# Patient Record
Sex: Female | Born: 1935
Health system: Southern US, Community
[De-identification: ages and names within clinical notes are randomized; demographics above are authoritative.]

## PROBLEM LIST (undated history)

## (undated) DIAGNOSIS — H353290 Exudative age-related macular degeneration, unspecified eye, stage unspecified: Secondary | ICD-10-CM

## (undated) DIAGNOSIS — E669 Obesity, unspecified: Secondary | ICD-10-CM

## (undated) DIAGNOSIS — I2510 Atherosclerotic heart disease of native coronary artery without angina pectoris: Secondary | ICD-10-CM

## (undated) DIAGNOSIS — I447 Left bundle-branch block, unspecified: Secondary | ICD-10-CM

## (undated) DIAGNOSIS — I6529 Occlusion and stenosis of unspecified carotid artery: Secondary | ICD-10-CM

## (undated) DIAGNOSIS — E785 Hyperlipidemia, unspecified: Secondary | ICD-10-CM

## (undated) DIAGNOSIS — I4891 Unspecified atrial fibrillation: Secondary | ICD-10-CM

## (undated) DIAGNOSIS — R42 Dizziness and giddiness: Secondary | ICD-10-CM

## (undated) DIAGNOSIS — I10 Essential (primary) hypertension: Secondary | ICD-10-CM

## (undated) HISTORY — DX: Essential (primary) hypertension: I10

## (undated) HISTORY — DX: Left bundle-branch block, unspecified: I44.7

## (undated) HISTORY — DX: Atherosclerotic heart disease of native coronary artery without angina pectoris: I25.10

## (undated) HISTORY — DX: Hyperlipidemia, unspecified: E78.5

## (undated) HISTORY — DX: Dizziness and giddiness: R42

## (undated) HISTORY — DX: Obesity, unspecified: E66.9

## (undated) HISTORY — DX: Occlusion and stenosis of unspecified carotid artery: I65.29

## (undated) HISTORY — DX: Unspecified atrial fibrillation: I48.91

## (undated) HISTORY — PX: EYE SURGERY: SHX253

## (undated) HISTORY — DX: Exudative age-related macular degeneration, unspecified eye, stage unspecified: H35.3290

## (undated) HISTORY — PX: CORONARY STENT PLACEMENT: SHX1402

---

## 1998-08-20 ENCOUNTER — Ambulatory Visit (HOSPITAL_COMMUNITY): Admission: RE | Admit: 1998-08-20 | Discharge: 1998-08-20 | Payer: Self-pay | Admitting: Cardiovascular Disease

## 1998-09-03 ENCOUNTER — Other Ambulatory Visit: Admission: RE | Admit: 1998-09-03 | Discharge: 1998-09-03 | Payer: Self-pay | Admitting: Internal Medicine

## 1998-09-10 ENCOUNTER — Ambulatory Visit (HOSPITAL_COMMUNITY): Admission: RE | Admit: 1998-09-10 | Discharge: 1998-09-10 | Payer: Self-pay | Admitting: Internal Medicine

## 1999-12-29 ENCOUNTER — Inpatient Hospital Stay (HOSPITAL_COMMUNITY): Admission: EM | Admit: 1999-12-29 | Discharge: 1999-12-31 | Payer: Self-pay | Admitting: Emergency Medicine

## 1999-12-29 ENCOUNTER — Encounter: Payer: Self-pay | Admitting: Internal Medicine

## 2001-03-15 ENCOUNTER — Encounter: Payer: Self-pay | Admitting: Cardiovascular Disease

## 2001-03-15 ENCOUNTER — Ambulatory Visit (HOSPITAL_COMMUNITY): Admission: RE | Admit: 2001-03-15 | Discharge: 2001-03-15 | Payer: Self-pay | Admitting: Cardiovascular Disease

## 2002-08-21 ENCOUNTER — Other Ambulatory Visit: Admission: RE | Admit: 2002-08-21 | Discharge: 2002-08-21 | Payer: Self-pay | Admitting: Internal Medicine

## 2003-09-04 ENCOUNTER — Emergency Department (HOSPITAL_COMMUNITY): Admission: EM | Admit: 2003-09-04 | Discharge: 2003-09-04 | Payer: Self-pay | Admitting: Emergency Medicine

## 2003-12-17 ENCOUNTER — Ambulatory Visit (HOSPITAL_COMMUNITY): Admission: RE | Admit: 2003-12-17 | Discharge: 2003-12-17 | Payer: Self-pay | Admitting: Internal Medicine

## 2004-01-16 ENCOUNTER — Inpatient Hospital Stay (HOSPITAL_COMMUNITY): Admission: EM | Admit: 2004-01-16 | Discharge: 2004-01-21 | Payer: Self-pay | Admitting: Emergency Medicine

## 2004-01-18 ENCOUNTER — Encounter: Payer: Self-pay | Admitting: Cardiology

## 2004-04-18 ENCOUNTER — Ambulatory Visit: Payer: Self-pay | Admitting: Cardiology

## 2004-05-09 ENCOUNTER — Ambulatory Visit: Payer: Self-pay | Admitting: Internal Medicine

## 2004-05-30 ENCOUNTER — Ambulatory Visit: Payer: Self-pay | Admitting: Cardiology

## 2004-06-23 ENCOUNTER — Ambulatory Visit: Payer: Self-pay | Admitting: Internal Medicine

## 2004-07-04 ENCOUNTER — Ambulatory Visit: Payer: Self-pay | Admitting: Internal Medicine

## 2004-07-25 ENCOUNTER — Ambulatory Visit: Payer: Self-pay | Admitting: Cardiology

## 2004-08-22 ENCOUNTER — Ambulatory Visit: Payer: Self-pay | Admitting: Cardiology

## 2004-09-05 ENCOUNTER — Ambulatory Visit: Payer: Self-pay | Admitting: Cardiology

## 2004-09-19 ENCOUNTER — Ambulatory Visit: Payer: Self-pay | Admitting: Cardiology

## 2004-09-28 ENCOUNTER — Ambulatory Visit: Payer: Self-pay | Admitting: Cardiovascular Disease

## 2004-10-10 ENCOUNTER — Ambulatory Visit: Payer: Self-pay | Admitting: Cardiology

## 2004-10-24 ENCOUNTER — Ambulatory Visit: Payer: Self-pay | Admitting: Cardiology

## 2004-11-03 ENCOUNTER — Ambulatory Visit: Payer: Self-pay | Admitting: Cardiology

## 2004-11-17 ENCOUNTER — Ambulatory Visit: Payer: Self-pay | Admitting: Cardiology

## 2004-12-15 ENCOUNTER — Ambulatory Visit: Payer: Self-pay | Admitting: Cardiology

## 2004-12-29 ENCOUNTER — Ambulatory Visit: Payer: Self-pay | Admitting: Cardiovascular Disease

## 2005-01-12 ENCOUNTER — Ambulatory Visit: Payer: Self-pay | Admitting: Cardiology

## 2005-02-09 ENCOUNTER — Ambulatory Visit: Payer: Self-pay | Admitting: Internal Medicine

## 2005-02-20 ENCOUNTER — Ambulatory Visit: Payer: Self-pay

## 2005-02-20 ENCOUNTER — Ambulatory Visit: Payer: Self-pay | Admitting: Cardiovascular Disease

## 2005-03-14 ENCOUNTER — Ambulatory Visit: Payer: Self-pay | Admitting: Internal Medicine

## 2005-04-12 ENCOUNTER — Ambulatory Visit: Payer: Self-pay | Admitting: *Deleted

## 2005-04-18 ENCOUNTER — Ambulatory Visit: Payer: Self-pay | Admitting: Cardiology

## 2005-04-28 ENCOUNTER — Ambulatory Visit: Payer: Self-pay | Admitting: Cardiovascular Disease

## 2005-05-10 ENCOUNTER — Ambulatory Visit: Payer: Self-pay | Admitting: Cardiology

## 2005-05-15 ENCOUNTER — Inpatient Hospital Stay (HOSPITAL_BASED_OUTPATIENT_CLINIC_OR_DEPARTMENT_OTHER): Admission: RE | Admit: 2005-05-15 | Discharge: 2005-05-15 | Payer: Self-pay | Admitting: Cardiovascular Disease

## 2005-05-15 ENCOUNTER — Ambulatory Visit: Payer: Self-pay | Admitting: Cardiovascular Disease

## 2005-05-17 ENCOUNTER — Inpatient Hospital Stay (HOSPITAL_COMMUNITY): Admission: RE | Admit: 2005-05-17 | Discharge: 2005-05-18 | Payer: Self-pay | Admitting: Cardiology

## 2005-05-22 ENCOUNTER — Ambulatory Visit: Payer: Self-pay | Admitting: Cardiology

## 2005-05-30 ENCOUNTER — Ambulatory Visit: Payer: Self-pay | Admitting: Cardiovascular Disease

## 2005-05-31 ENCOUNTER — Ambulatory Visit: Payer: Self-pay | Admitting: Internal Medicine

## 2005-06-08 ENCOUNTER — Ambulatory Visit: Payer: Self-pay | Admitting: *Deleted

## 2005-06-15 ENCOUNTER — Encounter (HOSPITAL_COMMUNITY): Admission: RE | Admit: 2005-06-15 | Discharge: 2005-09-13 | Payer: Self-pay | Admitting: Cardiovascular Disease

## 2005-06-20 ENCOUNTER — Ambulatory Visit: Payer: Self-pay | Admitting: Cardiology

## 2005-07-10 ENCOUNTER — Ambulatory Visit: Payer: Self-pay | Admitting: Cardiovascular Disease

## 2005-07-18 ENCOUNTER — Ambulatory Visit: Payer: Self-pay | Admitting: Cardiology

## 2005-08-15 ENCOUNTER — Ambulatory Visit: Payer: Self-pay | Admitting: *Deleted

## 2005-09-12 ENCOUNTER — Ambulatory Visit: Payer: Self-pay | Admitting: Cardiology

## 2005-10-03 ENCOUNTER — Ambulatory Visit: Payer: Self-pay | Admitting: Cardiovascular Disease

## 2005-10-31 ENCOUNTER — Ambulatory Visit: Payer: Self-pay | Admitting: *Deleted

## 2005-11-13 ENCOUNTER — Ambulatory Visit: Payer: Self-pay

## 2005-11-23 ENCOUNTER — Ambulatory Visit: Payer: Self-pay | Admitting: Cardiovascular Disease

## 2005-11-28 ENCOUNTER — Ambulatory Visit: Payer: Self-pay | Admitting: Cardiology

## 2005-12-27 ENCOUNTER — Ambulatory Visit: Payer: Self-pay | Admitting: *Deleted

## 2006-01-24 ENCOUNTER — Ambulatory Visit: Payer: Self-pay | Admitting: Cardiology

## 2006-02-14 ENCOUNTER — Ambulatory Visit: Payer: Self-pay | Admitting: Cardiovascular Disease

## 2006-02-16 ENCOUNTER — Ambulatory Visit: Payer: Self-pay | Admitting: Cardiology

## 2006-03-14 ENCOUNTER — Ambulatory Visit: Payer: Self-pay | Admitting: Internal Medicine

## 2006-04-11 ENCOUNTER — Ambulatory Visit: Payer: Self-pay | Admitting: Cardiology

## 2006-05-02 ENCOUNTER — Ambulatory Visit: Payer: Self-pay | Admitting: Cardiology

## 2006-05-30 ENCOUNTER — Ambulatory Visit: Payer: Self-pay | Admitting: *Deleted

## 2006-06-14 ENCOUNTER — Ambulatory Visit: Payer: Self-pay | Admitting: Cardiology

## 2006-07-02 ENCOUNTER — Ambulatory Visit: Payer: Self-pay | Admitting: Cardiology

## 2006-07-16 ENCOUNTER — Ambulatory Visit: Payer: Self-pay | Admitting: Cardiology

## 2006-07-30 ENCOUNTER — Ambulatory Visit: Payer: Self-pay | Admitting: Cardiology

## 2006-08-27 ENCOUNTER — Ambulatory Visit: Payer: Self-pay | Admitting: Internal Medicine

## 2006-08-30 ENCOUNTER — Ambulatory Visit: Payer: Self-pay | Admitting: Cardiovascular Disease

## 2006-09-24 ENCOUNTER — Ambulatory Visit: Payer: Self-pay | Admitting: Cardiology

## 2006-10-15 ENCOUNTER — Ambulatory Visit: Payer: Self-pay | Admitting: Cardiovascular Disease

## 2006-11-07 ENCOUNTER — Ambulatory Visit: Payer: Self-pay | Admitting: Cardiology

## 2006-11-28 ENCOUNTER — Ambulatory Visit: Payer: Self-pay | Admitting: Cardiology

## 2006-12-26 ENCOUNTER — Ambulatory Visit: Payer: Self-pay | Admitting: Internal Medicine

## 2007-01-15 ENCOUNTER — Ambulatory Visit: Payer: Self-pay | Admitting: Cardiology

## 2007-02-12 ENCOUNTER — Ambulatory Visit: Payer: Self-pay | Admitting: Cardiology

## 2007-03-12 ENCOUNTER — Ambulatory Visit: Payer: Self-pay | Admitting: Cardiology

## 2007-04-09 ENCOUNTER — Ambulatory Visit: Payer: Self-pay | Admitting: Cardiology

## 2007-05-06 ENCOUNTER — Ambulatory Visit: Payer: Self-pay | Admitting: Cardiology

## 2007-06-10 ENCOUNTER — Ambulatory Visit: Payer: Self-pay | Admitting: Cardiology

## 2007-07-08 ENCOUNTER — Ambulatory Visit: Payer: Self-pay | Admitting: Cardiology

## 2007-08-05 ENCOUNTER — Ambulatory Visit: Payer: Self-pay | Admitting: Cardiology

## 2007-09-02 ENCOUNTER — Ambulatory Visit: Payer: Self-pay | Admitting: Cardiology

## 2007-09-30 ENCOUNTER — Ambulatory Visit: Payer: Self-pay | Admitting: Cardiology

## 2007-10-28 ENCOUNTER — Ambulatory Visit: Payer: Self-pay | Admitting: Cardiology

## 2007-12-04 ENCOUNTER — Ambulatory Visit: Payer: Self-pay | Admitting: Internal Medicine

## 2008-01-01 ENCOUNTER — Ambulatory Visit: Payer: Self-pay | Admitting: Cardiology

## 2008-01-29 ENCOUNTER — Ambulatory Visit: Payer: Self-pay | Admitting: Cardiology

## 2008-02-19 ENCOUNTER — Ambulatory Visit: Payer: Self-pay | Admitting: Internal Medicine

## 2008-03-18 ENCOUNTER — Ambulatory Visit: Payer: Self-pay | Admitting: Internal Medicine

## 2008-04-15 ENCOUNTER — Ambulatory Visit: Payer: Self-pay | Admitting: Cardiovascular Disease

## 2008-05-13 ENCOUNTER — Ambulatory Visit: Payer: Self-pay | Admitting: Internal Medicine

## 2008-06-10 ENCOUNTER — Ambulatory Visit: Payer: Self-pay | Admitting: Internal Medicine

## 2008-07-08 ENCOUNTER — Ambulatory Visit: Payer: Self-pay | Admitting: Cardiology

## 2008-08-05 ENCOUNTER — Ambulatory Visit: Payer: Self-pay | Admitting: Cardiology

## 2008-08-19 ENCOUNTER — Ambulatory Visit: Payer: Self-pay | Admitting: Cardiovascular Disease

## 2008-09-02 ENCOUNTER — Ambulatory Visit: Payer: Self-pay | Admitting: Cardiovascular Disease

## 2008-09-02 ENCOUNTER — Ambulatory Visit: Payer: Self-pay | Admitting: Cardiology

## 2008-09-02 LAB — CONVERTED CEMR LAB
ALT: 19 units/L (ref 0–35)
AST: 24 units/L (ref 0–37)
Albumin: 3.9 g/dL (ref 3.5–5.2)
Alkaline Phosphatase: 65 units/L (ref 39–117)
BUN: 16 mg/dL (ref 6–23)
Bilirubin, Direct: 0.1 mg/dL (ref 0.0–0.3)
CO2: 29 meq/L (ref 19–32)
Calcium: 9.3 mg/dL (ref 8.4–10.5)
Chloride: 110 meq/L (ref 96–112)
Cholesterol: 159 mg/dL (ref 0–200)
Creatinine, Ser: 1 mg/dL (ref 0.4–1.2)
Direct LDL: 89.5 mg/dL
GFR calc non Af Amer: 57.28 mL/min (ref >60)
Glucose, Bld: 120 mg/dL — ABNORMAL HIGH (ref 70–99)
HDL: 45.4 mg/dL (ref >39.00)
Potassium: 4.3 meq/L (ref 3.5–5.1)
Sodium: 144 meq/L (ref 135–145)
Total Bilirubin: 0.9 mg/dL (ref 0.3–1.2)
Total CHOL/HDL Ratio: 4
Total Protein: 7.1 g/dL (ref 6.0–8.3)
Triglycerides: 220 mg/dL — ABNORMAL HIGH (ref 0.0–149.0)
VLDL: 44 mg/dL — ABNORMAL HIGH (ref 0.0–40.0)

## 2008-09-30 ENCOUNTER — Ambulatory Visit: Payer: Self-pay | Admitting: Internal Medicine

## 2008-10-21 ENCOUNTER — Ambulatory Visit: Payer: Self-pay | Admitting: Cardiology

## 2008-10-30 ENCOUNTER — Ambulatory Visit: Payer: Self-pay | Admitting: Cardiology

## 2008-11-10 ENCOUNTER — Encounter: Payer: Self-pay | Admitting: *Deleted

## 2008-11-18 ENCOUNTER — Ambulatory Visit: Payer: Self-pay | Admitting: Cardiology

## 2008-11-18 LAB — CONVERTED CEMR LAB: Protime: 17.9

## 2008-12-15 ENCOUNTER — Encounter: Payer: Self-pay | Admitting: Cardiovascular Disease

## 2008-12-16 ENCOUNTER — Encounter (INDEPENDENT_AMBULATORY_CARE_PROVIDER_SITE_OTHER): Payer: Self-pay | Admitting: Cardiology

## 2008-12-16 ENCOUNTER — Ambulatory Visit: Payer: Self-pay | Admitting: Internal Medicine

## 2008-12-16 ENCOUNTER — Encounter: Payer: Self-pay | Admitting: *Deleted

## 2008-12-16 DIAGNOSIS — I10 Essential (primary) hypertension: Secondary | ICD-10-CM | POA: Insufficient documentation

## 2008-12-16 DIAGNOSIS — I2510 Atherosclerotic heart disease of native coronary artery without angina pectoris: Secondary | ICD-10-CM

## 2008-12-16 DIAGNOSIS — I152 Hypertension secondary to endocrine disorders: Secondary | ICD-10-CM | POA: Insufficient documentation

## 2008-12-16 DIAGNOSIS — E1169 Type 2 diabetes mellitus with other specified complication: Secondary | ICD-10-CM | POA: Insufficient documentation

## 2008-12-16 DIAGNOSIS — I4891 Unspecified atrial fibrillation: Secondary | ICD-10-CM | POA: Insufficient documentation

## 2008-12-16 DIAGNOSIS — E782 Mixed hyperlipidemia: Secondary | ICD-10-CM

## 2008-12-16 LAB — CONVERTED CEMR LAB: POC INR: 2.9

## 2009-01-13 ENCOUNTER — Ambulatory Visit: Payer: Self-pay | Admitting: Cardiology

## 2009-01-13 LAB — CONVERTED CEMR LAB
POC INR: 2
Prothrombin Time: 17.3 s

## 2009-02-10 ENCOUNTER — Ambulatory Visit: Payer: Self-pay | Admitting: Internal Medicine

## 2009-02-10 ENCOUNTER — Encounter (INDEPENDENT_AMBULATORY_CARE_PROVIDER_SITE_OTHER): Payer: Self-pay | Admitting: Cardiology

## 2009-03-10 ENCOUNTER — Ambulatory Visit: Payer: Self-pay | Admitting: Cardiovascular Disease

## 2009-04-07 ENCOUNTER — Ambulatory Visit: Payer: Self-pay | Admitting: Cardiology

## 2009-04-07 LAB — CONVERTED CEMR LAB: POC INR: 2.1

## 2009-05-04 ENCOUNTER — Ambulatory Visit: Payer: Self-pay | Admitting: Cardiovascular Disease

## 2009-05-28 ENCOUNTER — Ambulatory Visit: Payer: Self-pay | Admitting: Cardiovascular Disease

## 2009-06-25 ENCOUNTER — Ambulatory Visit: Payer: Self-pay | Admitting: Internal Medicine

## 2009-07-07 ENCOUNTER — Encounter (INDEPENDENT_AMBULATORY_CARE_PROVIDER_SITE_OTHER): Payer: Self-pay | Admitting: *Deleted

## 2009-07-23 ENCOUNTER — Ambulatory Visit: Payer: Self-pay | Admitting: Internal Medicine

## 2009-07-23 LAB — CONVERTED CEMR LAB: POC INR: 1.9

## 2009-08-13 ENCOUNTER — Ambulatory Visit: Payer: Self-pay | Admitting: Cardiology

## 2009-08-13 LAB — CONVERTED CEMR LAB: POC INR: 2

## 2009-08-23 DIAGNOSIS — E669 Obesity, unspecified: Secondary | ICD-10-CM | POA: Insufficient documentation

## 2009-08-31 ENCOUNTER — Ambulatory Visit: Payer: Self-pay | Admitting: Cardiovascular Disease

## 2009-08-31 DIAGNOSIS — I779 Disorder of arteries and arterioles, unspecified: Secondary | ICD-10-CM | POA: Insufficient documentation

## 2009-08-31 DIAGNOSIS — I739 Peripheral vascular disease, unspecified: Secondary | ICD-10-CM

## 2009-09-10 ENCOUNTER — Ambulatory Visit: Payer: Self-pay | Admitting: Internal Medicine

## 2009-09-10 LAB — CONVERTED CEMR LAB: POC INR: 1.9

## 2009-09-17 ENCOUNTER — Ambulatory Visit: Payer: Self-pay

## 2009-09-17 ENCOUNTER — Ambulatory Visit: Payer: Self-pay | Admitting: Cardiovascular Disease

## 2009-09-20 ENCOUNTER — Encounter (INDEPENDENT_AMBULATORY_CARE_PROVIDER_SITE_OTHER): Payer: Self-pay | Admitting: *Deleted

## 2009-10-08 ENCOUNTER — Ambulatory Visit: Payer: Self-pay | Admitting: Cardiology

## 2009-10-08 LAB — CONVERTED CEMR LAB: POC INR: 2.3

## 2009-11-05 ENCOUNTER — Ambulatory Visit: Payer: Self-pay | Admitting: Cardiology

## 2009-11-05 LAB — CONVERTED CEMR LAB: POC INR: 2.3

## 2009-12-03 ENCOUNTER — Encounter (INDEPENDENT_AMBULATORY_CARE_PROVIDER_SITE_OTHER): Payer: Self-pay | Admitting: *Deleted

## 2009-12-03 ENCOUNTER — Ambulatory Visit: Payer: Self-pay | Admitting: Cardiology

## 2009-12-31 ENCOUNTER — Ambulatory Visit: Payer: Self-pay | Admitting: Cardiology

## 2009-12-31 LAB — CONVERTED CEMR LAB: POC INR: 1.6

## 2010-01-21 ENCOUNTER — Ambulatory Visit: Payer: Self-pay | Admitting: Internal Medicine

## 2010-01-21 LAB — CONVERTED CEMR LAB: POC INR: 1.9

## 2010-02-18 ENCOUNTER — Ambulatory Visit: Payer: Self-pay | Admitting: Internal Medicine

## 2010-03-10 ENCOUNTER — Ambulatory Visit: Payer: Self-pay | Admitting: Cardiovascular Disease

## 2010-03-10 DIAGNOSIS — R0602 Shortness of breath: Secondary | ICD-10-CM | POA: Insufficient documentation

## 2010-03-18 ENCOUNTER — Ambulatory Visit (HOSPITAL_COMMUNITY): Admission: RE | Admit: 2010-03-18 | Discharge: 2010-03-18 | Payer: Self-pay | Admitting: Cardiovascular Disease

## 2010-03-18 ENCOUNTER — Ambulatory Visit: Payer: Self-pay | Admitting: Cardiology

## 2010-03-18 ENCOUNTER — Ambulatory Visit: Payer: Self-pay

## 2010-03-18 ENCOUNTER — Encounter: Payer: Self-pay | Admitting: Cardiovascular Disease

## 2010-04-15 ENCOUNTER — Ambulatory Visit: Payer: Self-pay | Admitting: Cardiology

## 2010-04-15 LAB — CONVERTED CEMR LAB: POC INR: 2.3

## 2010-05-13 ENCOUNTER — Ambulatory Visit: Payer: Self-pay | Admitting: Cardiovascular Disease

## 2010-05-13 LAB — CONVERTED CEMR LAB: POC INR: 2.6

## 2010-06-10 ENCOUNTER — Ambulatory Visit: Payer: Self-pay | Admitting: Cardiology

## 2010-07-08 ENCOUNTER — Ambulatory Visit: Admission: RE | Admit: 2010-07-08 | Discharge: 2010-07-08 | Payer: Self-pay | Source: Home / Self Care

## 2010-07-08 LAB — CONVERTED CEMR LAB: POC INR: 2.1

## 2010-07-10 LAB — CONVERTED CEMR LAB
ALT: 20 units/L (ref 0–35)
Alkaline Phosphatase: 60 units/L (ref 39–117)
BUN: 17 mg/dL (ref 6–23)
Basophils Relative: 0.8 % (ref 0.0–3.0)
Bilirubin, Direct: 0.1 mg/dL (ref 0.0–0.3)
Cholesterol: 151 mg/dL (ref 0–200)
Creatinine, Ser: 1.1 mg/dL (ref 0.4–1.2)
Eosinophils Relative: 7.3 % — ABNORMAL HIGH (ref 0.0–5.0)
GFR calc non Af Amer: 51.17 mL/min (ref >60)
Glucose, Bld: 118 mg/dL — ABNORMAL HIGH (ref 70–99)
LDL Cholesterol: 61 mg/dL (ref 0–99)
Lymphocytes Relative: 34.2 % (ref 12.0–46.0)
MCV: 90.9 fL (ref 78.0–100.0)
Monocytes Relative: 10.2 % (ref 3.0–12.0)
Neutrophils Relative %: 47.5 % (ref 43.0–77.0)
RBC: 4.43 M/uL (ref 3.87–5.11)
TSH: 1.88 microintl units/mL (ref 0.35–5.50)
Total Bilirubin: 0.4 mg/dL (ref 0.3–1.2)
Total Protein: 7.4 g/dL (ref 6.0–8.3)
WBC: 5.7 10*3/uL (ref 4.5–10.5)

## 2010-07-14 NOTE — Medication Information (Signed)
Summary: rov/kh   Anticoagulant Therapy  Managed by: Tammy Sours, PharmD Referring MD: Charlton Haws MD PCP: Dr. Luanna Cole. Lenord Fellers Supervising MD: Shirlee Latch MD, [place] Indication 1: Atrial Fibrillation (ICD-427.31) Lab Used: [facility] [place] Site: [address] INR POC 2.4 INR RANGE 2 - 2.5  Dietary changes: no    Health status changes: no    Bleeding/hemorrhagic complications: no    Recent/future hospitalizations: no    Any changes in medication regimen? no    Recent/future dental: no  Any missed doses?: no       Is patient compliant with meds? yes       Allergies: No Known Drug Allergies  Anticoagulation Management History:      The patient is taking warfarin and comes in today for a routine follow up visit.  Positive risk factors for bleeding include an age of 104 years or older.  The bleeding index is 'intermediate risk'.  Positive CHADS2 values include History of HTN and Age > 30 years old.  The start date was 12/28/1999.  Anticoagulation responsible provider: Shirlee Latch MD, [place].  INR POC: 2.4.  Cuvette Lot#: [id number].  Exp: 05/2011.    Anticoagulation Management Assessment/Plan:      The patient's current anticoagulation dose is Warfarin sodium 5 mg tabs: Use as directed by Anticoagulation Clinic.  The target INR is 2 - 2.5.  The next INR is due 07/08/2010.  Anticoagulation instructions were given to patient.  Results were reviewed/authorized by Tammy Sours, PharmD.         Prior Anticoagulation Instructions: INR 2.6  Continue same dose of 1/2 tablet every day.  Recheck INR in 4 weeks.   Current Anticoagulation Instructions: INR 2.4  Continue same dose of 1/2 tablet every day. Recheck INR in 4 weeks.

## 2010-07-14 NOTE — Assessment & Plan Note (Signed)
Summary: 6 month rov/sl  Medications Added CENTRUM SILVER  TABS (MULTIPLE VITAMINS-MINERALS) 1 tab by mouth once daily      Allergies Added: NKDA  Primary Provider:  Dr. Luanna Cole. [place]  CC:  sob pt thinks it jdue to weight .  History of Present Illness: Judy [name] returns today for followup.  She has chronic atrial fibrillationon anticoagulation.  She has hypertension which is well controlled. I believe she has had a distant history of coronary artery disease withstent to the RCA in 1996 and a repeat stent to the PDA, OM in 2006. Her EF has been in the 50% range.  Her last Myoview I believe was in2007 and was at low risk.  She is not having significant chest pain.Her INRs have been therapeutic. She has a history of 40% ICA stenosis and duplex last year were stable.  .  I encouraged her to see Dr Baird Kay for her primary care F/U. She needs routine lab work  She has been having some dyspnea and needs an [place] to make sure RV/LV function are still normal  Current Problems (verified): 1)  Carotid Artery Disease  (ICD-433.10) 2)  Cad, Native Vessel  (ICD-414.01) 3)  Atrial Fibrillation  (ICD-427.31) 4)  Essential Hypertension, Benign  (ICD-401.1) 5)  Mixed Hyperlipidemia  (ICD-272.2) 6)  Obesity  (ICD-278.00) 7)  Coumadin Therapy  (ICD-V58.61)  Current Medications (verified): 1)  Metoprolol Succinate 100 Mg Xr24h-Tab (Metoprolol Succinate) .Marland Kitchen.. 1 Tab By Mouth Two Times A Day 2)  Plavix 75 Mg Tabs (Clopidogrel Bisulfate) .... Take One Tablet By Mouth Daily 3)  Isosorbide Mononitrate Cr 30 Mg Xr24h-Tab (Isosorbide Mononitrate) .... Take One Tablet By Mouth Daily 4)  Lisinopril 10 Mg Tabs (Lisinopril) .... Take One Tablet By Mouth Daily 5)  Vitamin B-12 1000 Mcg Tabs (Cyanocobalamin) .Marland Kitchen.. 1 By Mouth Every Other Day. 6)  Warfarin Sodium 5 Mg Tabs (Warfarin Sodium) .... Use As Directed By Anticoagulation Clinic 7)  Pravastatin Sodium 80 Mg Tabs (Pravastatin Sodium) .... Take One Tablet By Mouth Daily  At Bedtime 8)  Aspirin 81 Mg Tbec (Aspirin) .... Take One Tablet By Mouth Daily 9)  Folic Acid 400 Mcg Tabs (Folic Acid) .Marland Kitchen.. 1 By Mouth Daily 10)  Furosemide 20 Mg Tabs (Furosemide) .... Take One Tablet By Mouth Daily. 11)  Diltiazem Hcl Er Beads 120 Mg Xr24h-Cap (Diltiazem Hcl Er Beads) .... Take One Capsule By Mouth Daily 12)  Potassium Chloride Cr 10 Meq Cr-Caps (Potassium Chloride) .... Take One Tablet By Mouth Daily 13)  Osteo Bi-Flex Adv Joint Shield  Tabs (Misc Natural Products) .Marland Kitchen.. 1 Tab Op Once Daily 14)  Claritin 10 Mg Tabs (Loratadine) .Marland Kitchen.. 1 Tab By Mouth Once Daily 15)  Centrum Silver  Tabs (Multiple Vitamins-Minerals) .Marland Kitchen.. 1 Tab By Mouth Once Daily  Allergies (verified): No Known Drug Allergies  Past History:  Past Medical History: Last updated: 08/23/2009 Current Problems:  CAD, NATIVE VESSEL (ICD-414.01) Stent RCA 96' Stent PDA and OM DES 2006 ATRIAL FIBRILLATION (ICD-427.31) ESSENTIAL HYPERTENSION, BENIGN (ICD-401.1) MIXED HYPERLIPIDEMIA (ICD-272.2) OBESITY (ICD-278.00) COUMADIN THERAPY (ICD-V58.61)  Past Surgical History: Last updated: 08/23/2009  PROCEDURE:  Drug-eluting stent placement to the PDA, drug-eluting stent  placement to the first obtuse marginal, StarClose closure of the right common femoral arteriotomy site. Successful drug-eluting stent placement in  both the posterior descending artery and the obtuse marginal. She should be  maintained on Plavix for 6 months. Aspirin and Coumadin will both be continued indefinitely. Coumadin will be resumed this evening. The lesion  was directly stented using a 2.5 x 16 mm Taxus deployed at 14 atmospheres. The stent was then post dilated using a 2.75 x 15 mm Quantum at 16 atmospheres. Repeat angiography demonstrated no residual stenosis, no dissection, and TIMI-3 flow to the distal vasculature. There was no significant stenosis in the jailed AV groove circumflex. Salvadore Farber, M.D. [facility] Electronically Signed  WED/MEDQ   D:  05/17/2005  T:  05/17/2005  Job:  [id number] cc:   Charlton Haws, M.D.    Family History: Last updated: 08/23/2009 Positive for coronary artery disease.  Social History: Last updated: 08/23/2009  She does not smoke, nor does she consume alcohol.  Review of Systems       Denies fever, malais, weight loss, blurry vision, decreased visual acuity, cough, sputum, , hemoptysis, pleuritic pain, palpitaitons, heartburn, abdominal pain, melena, lower extremity edema, claudication, or rash.   Vital Signs:  Patient profile:   77 year old female Height:      65 inches Weight:      205 pounds BMI:     34.24 Pulse rate:   72 / minute Resp:     12 per minute BP sitting:   136 / 80  (left arm)  Vitals Entered By: Kem Parkinson (March 10, 2010 10:41 AM)  Physical Exam  General:  Affect appropriate Healthy:  appears stated age HEENT: normal Neck supple with no adenopathy JVP normal left bruits no thyromegaly Lungs clear with no wheezing and good diaphragmatic motion Heart:  S1/S2 SEM  murmur,rub, gallop or click PMI normal Abdomen: benighn, BS positve, no tenderness, no AAA no bruit.  No HSM or HJR Distal pulses intact with no bruits No edema Neuro non-focal Skin warm and dry    Impression & Recommendations:  Problem # 1:  CAROTID ARTERY DISEASE (ICD-433.10) F/U duplex in a year no high grade disease by duplex 2010 Her updated medication list for this problem includes:    Plavix 75 Mg Tabs (Clopidogrel bisulfate) .Marland Kitchen... Take one tablet by mouth daily    Warfarin Sodium 5 Mg Tabs (Warfarin sodium) ..... Use as directed by anticoagulation clinic    Aspirin 81 Mg Tbec (Aspirin) .Marland Kitchen... Take one tablet by mouth daily  Problem # 2:  CAD, NATIVE VESSEL (ICD-414.01) Stable no angina Her updated medication list for this problem includes:    Metoprolol Succinate 100 Mg Xr24h-tab (Metoprolol succinate) .Marland Kitchen... 1 tab by mouth two times a day    Plavix 75 Mg Tabs (Clopidogrel  bisulfate) .Marland Kitchen... Take one tablet by mouth daily    Isosorbide Mononitrate Cr 30 Mg Xr24h-tab (Isosorbide mononitrate) .Marland Kitchen... Take one tablet by mouth daily    Lisinopril 10 Mg Tabs (Lisinopril) .Marland Kitchen... Take one tablet by mouth daily    Warfarin Sodium 5 Mg Tabs (Warfarin sodium) ..... Use as directed by anticoagulation clinic    Aspirin 81 Mg Tbec (Aspirin) .Marland Kitchen... Take one tablet by mouth daily    Diltiazem Hcl Er Beads 120 Mg Xr24h-cap (Diltiazem hcl er beads) .Marland Kitchen... Take one capsule by mouth daily  Problem # 3:  ATRIAL FIBRILLATION (ICD-427.31) F/U coumadin clinic next week.  good rate contorl on dual Rx Her updated medication list for this problem includes:    Metoprolol Succinate 100 Mg Xr24h-tab (Metoprolol succinate) .Marland Kitchen... 1 tab by mouth two times a day    Plavix 75 Mg Tabs (Clopidogrel bisulfate) .Marland Kitchen... Take one tablet by mouth daily    Warfarin Sodium 5 Mg Tabs (Warfarin sodium) ..... Use as directed by anticoagulation  clinic    Aspirin 81 Mg Tbec (Aspirin) .Marland Kitchen... Take one tablet by mouth daily  Orders: Echocardiogram (Echo)  Problem # 4:  MIXED HYPERLIPIDEMIA (ICD-272.2) Hig dose simva D/C and now on pravastatin.  Labs in 6 months Her updated medication list for this problem includes:    Pravastatin Sodium 80 Mg Tabs (Pravastatin sodium) .Marland Kitchen... Take one tablet by mouth daily at bedtime  CHOL: 151 (09/17/2009)   LDL: 61 (09/17/2009)   HDL: 52.00 (09/17/2009)   TG: 191.0 (09/17/2009)  Problem # 5:  DYSPNEA (ICD-786.05) Functional.  Rate control seems good.  Check echo for RV/LV function Her updated medication list for this problem includes:    Metoprolol Succinate 100 Mg Xr24h-tab (Metoprolol succinate) .Marland Kitchen... 1 tab by mouth two times a day    Lisinopril 10 Mg Tabs (Lisinopril) .Marland Kitchen... Take one tablet by mouth daily    Aspirin 81 Mg Tbec (Aspirin) .Marland Kitchen... Take one tablet by mouth daily    Furosemide 20 Mg Tabs (Furosemide) .Marland Kitchen... Take one tablet by mouth daily.    Diltiazem Hcl Er Beads 120  Mg Xr24h-cap (Diltiazem hcl er beads) .Marland Kitchen... Take one capsule by mouth daily  Patient Instructions: 1)  Your physician recommends that you schedule a follow-up appointment in: 6 MONTHS 2)  Your physician has requested that you have an echocardiogram.  Echocardiography is a painless test that uses sound waves to create images of your heart. It provides your doctor with information about the size and shape of your heart and how well your heart's chambers and valves are working.  This procedure takes approximately one hour. There are no restrictions for this procedure.

## 2010-07-14 NOTE — Medication Information (Signed)
Summary: rov/ewj  Anticoagulant Therapy  Managed by: Eda Keys, PharmD Referring MD: Charlton Haws MD PCP: Dr. Luanna Cole. Lenord Fellers Supervising MD: Myrtis Ser MD, Tinnie Gens Indication 1: Atrial Fibrillation (ICD-427.31) Lab Used: [facility] [place] Site: [address] INR POC 2.3 INR RANGE 2 - 2.5  Dietary changes: no    Health status changes: no    Bleeding/hemorrhagic complications: no    Recent/future hospitalizations: no    Any changes in medication regimen? no    Recent/future dental: no  Any missed doses?: no       Is patient compliant with meds? yes       Allergies: No Known Drug Allergies  Anticoagulation Management History:      The patient is taking warfarin and comes in today for a routine follow up visit.  Positive risk factors for bleeding include an age of 75 years or older.  The bleeding index is 'intermediate risk'.  Positive CHADS2 values include History of HTN and Age > 75 years old.  The start date was 12/28/1999.  Anticoagulation responsible provider: Myrtis Ser MD, Tinnie Gens.  INR POC: 2.3.  Cuvette Lot#: [id number].  Exp: 01/2011.    Anticoagulation Management Assessment/Plan:      The patient's current anticoagulation dose is Warfarin sodium 5 mg tabs: Use as directed by Anticoagulation Clinic.  The target INR is 2 - 2.5.  The next INR is due 12/03/2009.  Anticoagulation instructions were given to patient.  Results were reviewed/authorized by Eda Keys, PharmD.  She was notified by Eda Keys.         Prior Anticoagulation Instructions: INR 2.3  Continue on same dosage 1/2 tablet daily.  Recheck in 4 weeks.    Current Anticoagulation Instructions: INR 2.3  Continue taking 1/2 tablet every day.  Return to clinic in 4 weeks.

## 2010-07-14 NOTE — Medication Information (Signed)
Summary: rov/ewj  Anticoagulant Therapy  Managed by: Bethena Midget, RN, BSN Referring MD: Charlton Haws MD PCP: Dr. Luanna Cole. Lenord Fellers Supervising MD: Gala Romney MD, Reuel Boom Indication 1: Atrial Fibrillation (ICD-427.31) Lab Used: [facility] [place] Site: [address] INR POC 1.9 INR RANGE 2 - 2.5  Dietary changes: no    Health status changes: no    Bleeding/hemorrhagic complications: no    Recent/future hospitalizations: no    Any changes in medication regimen? no    Recent/future dental: no  Any missed doses?: no       Is patient compliant with meds? yes       Allergies: No Known Drug Allergies  Anticoagulation Management History:      The patient is taking warfarin and comes in today for a routine follow up visit.  Positive risk factors for bleeding include an age of 75 years or older.  The bleeding index is 'intermediate risk'.  Positive CHADS2 values include History of HTN and Age > 33 years old.  The start date was 12/28/1999.  Anticoagulation responsible provider: Bensimhon MD, Reuel Boom.  INR POC: 1.9.  Cuvette Lot#: [id number].  Exp: 09/2010.    Anticoagulation Management Assessment/Plan:      The patient's current anticoagulation dose is Warfarin sodium 5 mg tabs: Use as directed by Anticoagulation Clinic.  The target INR is 2 - 2.5.  The next INR is due 08/13/2009.  Anticoagulation instructions were given to patient.  Results were reviewed/authorized by Bethena Midget, RN, BSN.  She was notified by Bethena Midget, RN, BSN.         Prior Anticoagulation Instructions: INR 1.9  Take 1 tablet today then resume same dosage 1/2 tablet daily.  Recheck in 4 weeks.    Current Anticoagulation Instructions: INR 1.9 Today take 5mg  then resume 2.5mg s everyday. Recheck in 3 weeks.

## 2010-07-14 NOTE — Medication Information (Signed)
Summary: rov/eac  Anticoagulant Therapy  Managed by: Cloyde Reams, RN, BSN Referring MD: Charlton Haws MD PCP: Dr. Luanna Cole. Lenord Fellers Supervising MD: Riley Kill MD, Maisie Fus Indication 1: Atrial Fibrillation (ICD-427.31) Lab Used: [facility]  Site: [address] INR POC 2.3 INR RANGE 2 - 2.5  Dietary changes: no    Health status changes: no    Bleeding/hemorrhagic complications: no    Recent/future hospitalizations: no    Any changes in medication regimen? no    Recent/future dental: no  Any missed doses?: no       Is patient compliant with meds? yes       Allergies: No Known Drug Allergies  Anticoagulation Management History:      The patient is taking warfarin and comes in today for a routine follow up visit.  Positive risk factors for bleeding include an age of 75 years or older.  The bleeding index is 'intermediate risk'.  Positive CHADS2 values include History of HTN and Age > 53 years old.  The start date was 12/28/1999.  Anticoagulation responsible provider: Riley Kill MD, Maisie Fus.  INR POC: 2.3.  Cuvette Lot#: [id number].  Exp: 02/2011.    Anticoagulation Management Assessment/Plan:      The patient's current anticoagulation dose is Warfarin sodium 5 mg tabs: Use as directed by Anticoagulation Clinic.  The target INR is 2 - 2.5.  The next INR is due 12/31/2009.  Anticoagulation instructions were given to patient.  Results were reviewed/authorized by Cloyde Reams, RN, BSN.  She was notified by Cloyde Reams RN.         Prior Anticoagulation Instructions: INR 2.3  Continue taking 1/2 tablet every day.  Return to clinic in 4 weeks.    Current Anticoagulation Instructions: INR 2.3  Continue on same dosage 1/2 tablet daily.  Recheck in 4 weeks.

## 2010-07-14 NOTE — Medication Information (Signed)
Summary: rov/sel      Allergies Added: NKDA Anticoagulant Therapy  Managed by: Weston Brass, PharmD Referring MD: Charlton Haws MD PCP: Dr. Luanna Cole. Lenord Fellers Supervising MD: Juanda Chance MD, Bruce Indication 1: Atrial Fibrillation (ICD-427.31) Lab Used: [facility] [place] Site: [address] INR POC 2.3 INR RANGE 2 - 2.5  Dietary changes: no    Health status changes: no    Bleeding/hemorrhagic complications: no    Recent/future hospitalizations: no    Any changes in medication regimen? no    Recent/future dental: no  Any missed doses?: no       Is patient compliant with meds? yes       Allergies (verified): No Known Drug Allergies  Anticoagulation Management History:      The patient is taking warfarin and comes in today for a routine follow up visit.  Positive risk factors for bleeding include an age of 75 years or older.  The bleeding index is 'intermediate risk'.  Positive CHADS2 values include History of HTN and Age > 22 years old.  The start date was 12/28/1999.  Anticoagulation responsible provider: Juanda Chance MD, Smitty Cords.  INR POC: 2.3.  Cuvette Lot#: [id number].  Exp: 04/2011.    Anticoagulation Management Assessment/Plan:      The patient's current anticoagulation dose is Warfarin sodium 5 mg tabs: Use as directed by Anticoagulation Clinic.  The target INR is 2 - 2.5.  The next INR is due 05/13/2010.  Anticoagulation instructions were given to patient.  Results were reviewed/authorized by Weston Brass, PharmD.  She was notified by Hoy Register, PharmD Candidate.         Prior Anticoagulation Instructions: INR 2.0  continue taking 1/2 tablet everyday. Recheck 4 weeks.   Current Anticoagulation Instructions: INR 2.3 Continue previos dose of 1/2 tablet everyday Recheck INR in 4 weeks.

## 2010-07-14 NOTE — Medication Information (Signed)
Summary: rov/tm  Anticoagulant Therapy  Managed by: Cloyde Reams, RN, BSN Referring MD: Charlton Haws MD PCP: Dr. Luanna Cole. Lenord Fellers Supervising MD: Tenny Craw MD, Gunnar Fusi Indication 1: Atrial Fibrillation (ICD-427.31) Lab Used: [facility] [place] Site: [address] INR POC 2.1 INR RANGE 2 - 2.5  Dietary changes: no    Health status changes: no    Bleeding/hemorrhagic complications: no    Recent/future hospitalizations: no    Any changes in medication regimen? no    Recent/future dental: no  Any missed doses?: no       Is patient compliant with meds? yes       Allergies: No Known Drug Allergies  Anticoagulation Management History:      The patient is taking warfarin and comes in today for a routine follow up visit.  Positive risk factors for bleeding include an age of 75 years or older.  The bleeding index is 'intermediate risk'.  Positive CHADS2 values include History of HTN and Age > 70 years old.  The start date was 12/28/1999.  Anticoagulation responsible Jackalynn Art: Tenny Craw MD, Gunnar Fusi.  INR POC: 2.1.  Cuvette Lot#: [id number].  Exp: 04/2011.    Anticoagulation Management Assessment/Plan:      The patient's current anticoagulation dose is Warfarin sodium 5 mg tabs: Use as directed by Anticoagulation Clinic.  The target INR is 2 - 2.5.  The next INR is due 03/18/2010.  Anticoagulation instructions were given to patient.  Results were reviewed/authorized by Cloyde Reams, RN, BSN.  She was notified by Cloyde Reams RN.         Prior Anticoagulation Instructions: INR 1.9  Take 1 tablet today, Aug. 12, then continue with 1/2 tablet daily.  Return to clinic in 4 weeks.  Current Anticoagulation Instructions: INR 2.1  Continue on same dosage 1/2 tablet daily.  Recheck in 4 weeks.

## 2010-07-14 NOTE — Medication Information (Signed)
Summary: rov/kh  Anticoagulant Therapy  Managed by: Bethena Midget, RN, BSN Referring MD: Charlton Haws MD PCP: Dr. Luanna Cole. Lenord Fellers Supervising MD: Tenny Craw MD, Gunnar Fusi Indication 1: Atrial Fibrillation (ICD-427.31) Lab Used: [facility] [place] Site: [address] INR POC 2.1 INR RANGE 2 - 2.5  Dietary changes: no    Health status changes: no    Bleeding/hemorrhagic complications: no    Recent/future hospitalizations: no    Any changes in medication regimen? yes       Details: Took some muscle relaxers last week  Recent/future dental: no  Any missed doses?: no       Is patient compliant with meds? yes       Allergies: No Known Drug Allergies  Anticoagulation Management History:      The patient is taking warfarin and comes in today for a routine follow up visit.  Positive risk factors for bleeding include an age of 75 years or older.  The bleeding index is 'intermediate risk'.  Positive CHADS2 values include History of HTN and Age > 35 years old.  The start date was 12/28/1999.  Anticoagulation responsible provider: Tenny Craw MD, Gunnar Fusi.  INR POC: 2.1.  Cuvette Lot#: [id number].  Exp: 06/2011.    Anticoagulation Management Assessment/Plan:      The patient's current anticoagulation dose is Warfarin sodium 5 mg tabs: Use as directed by Anticoagulation Clinic.  The target INR is 2 - 2.5.  The next INR is due 08/08/2010.  Anticoagulation instructions were given to patient.  Results were reviewed/authorized by Bethena Midget, RN, BSN.  She was notified by Bethena Midget, RN, BSN.         Prior Anticoagulation Instructions: INR 2.4  Continue same dose of 1/2 tablet every day. Recheck INR in 4 weeks.   Current Anticoagulation Instructions: INR 2.1 Continue 2.5mg s everyday. Recheck in 4 weeks.

## 2010-07-14 NOTE — Miscellaneous (Signed)
  Clinical Lists Changes due to FDA regulations pt changed from simvastatin 80mg  to pravastatin 80mg  once daily. she will have labs checked in august with her coumadin check Deliah Goody, RN  December 03, 2009 10:00 AM  Medications: Changed medication from SIMVASTATIN 80 MG TABS (SIMVASTATIN) Take one tablet by mouth daily at bedtime to PRAVASTATIN SODIUM 80 MG TABS (PRAVASTATIN SODIUM) Take one tablet by mouth daily at bedtime - Signed Rx of PRAVASTATIN SODIUM 80 MG TABS (PRAVASTATIN SODIUM) Take one tablet by mouth daily at bedtime;  #30 x 12;  Signed;  Entered by: Deliah Goody, RN;  Authorized by: Colon Branch, MD, [facility];  Method used: Electronically to [facility] [address] [place], [place], [place]  [address], Ph: [phone], Fax: [phone]    Prescriptions: PRAVASTATIN SODIUM 80 MG TABS (PRAVASTATIN SODIUM) Take one tablet by mouth daily at bedtime  #30 x 12   Entered by:   Deliah Goody, RN   Authorized by:   Colon Branch, MD, [facility]   Signed by:   Deliah Goody, RN on 12/03/2009   Method used:   Electronically to        [facility]       [address]       [place], [place]  [address]       Ph: [phone]       Fax: [phone]   RxID:   [id number]

## 2010-07-14 NOTE — Medication Information (Signed)
Summary: Judy [name]  Anticoagulant Therapy  Managed by: Cloyde Reams, RN, BSN Referring MD: Charlton Haws MD PCP: Dr. Luanna Cole. Lenord Fellers Supervising MD: Gala Romney MD, Reuel Boom Indication 1: Atrial Fibrillation (ICD-427.31) Lab Used: [facility] [place] Site: [address] INR POC 1.9 INR RANGE 2 - 2.5  Dietary changes: no    Health status changes: no    Bleeding/hemorrhagic complications: no    Recent/future hospitalizations: no    Any changes in medication regimen? no    Recent/future dental: no  Any missed doses?: no       Is patient compliant with meds? yes       Allergies (verified): No Known Drug Allergies  Anticoagulation Management History:      The patient is taking warfarin and comes in today for a routine follow up visit.  Positive risk factors for bleeding include an age of 75 years or older.  The bleeding index is 'intermediate risk'.  Positive CHADS2 values include History of HTN and Age > 24 years old.  The start date was 12/28/1999.  Anticoagulation responsible provider: Jarita Raval MD, Reuel Boom.  INR POC: 1.9.  Cuvette Lot#: [id number].  Exp: 09/2010.    Anticoagulation Management Assessment/Plan:      The patient's current anticoagulation dose is Warfarin sodium 5 mg tabs: Use as directed by Anticoagulation Clinic.  The target INR is 2 - 2.5.  The next INR is due 07/23/2009.  Anticoagulation instructions were given to patient.  Results were reviewed/authorized by Cloyde Reams, RN, BSN.  She was notified by Cloyde Reams RN.         Prior Anticoagulation Instructions: INR 2.4  CONTINUE TO TAKE 1/2 TABLET EVERYDAY.  RECHECK IN 4 WEEKS.  Current Anticoagulation Instructions: INR 1.9  Take 1 tablet today then resume same dosage 1/2 tablet daily.  Recheck in 4 weeks.

## 2010-07-14 NOTE — Medication Information (Signed)
Summary: rov/ln  Anticoagulant Therapy  Managed by: Bethena Midget, RN, BSN Referring MD: Charlton Haws MD PCP: Dr. Luanna Cole. Lenord Fellers Supervising MD: Tenny Craw MD, Gunnar Fusi Indication 1: Atrial Fibrillation (ICD-427.31) Lab Used: [facility] [place] Site: [address] INR POC 1.9 INR RANGE 2 - 2.5  Dietary changes: no    Health status changes: no    Bleeding/hemorrhagic complications: no    Recent/future hospitalizations: no    Any changes in medication regimen? no    Recent/future dental: no  Any missed doses?: no       Is patient compliant with meds? yes       Allergies: No Known Drug Allergies  Anticoagulation Management History:      The patient is taking warfarin and comes in today for a routine follow up visit.  Positive risk factors for bleeding include an age of 75 years or older.  The bleeding index is 'intermediate risk'.  Positive CHADS2 values include History of HTN and Age > 66 years old.  The start date was 12/28/1999.  Anticoagulation responsible provider: Tenny Craw MD, Gunnar Fusi.  INR POC: 1.9.  Cuvette Lot#: [id number].  Exp: 03/2011.    Anticoagulation Management Assessment/Plan:      The patient's current anticoagulation dose is Warfarin sodium 5 mg tabs: Use as directed by Anticoagulation Clinic.  The target INR is 2 - 2.5.  The next INR is due 02/18/2010.  Anticoagulation instructions were given to patient.  Results were reviewed/authorized by Bethena Midget, RN, BSN.  She was notified by Liana Gerold, PharmD Candidate.         Prior Anticoagulation Instructions: INR 1.6  Take 1 tab today and then resume normal regimen of 1/2 tab daily.  Re-check in 3 weeks.   Current Anticoagulation Instructions: INR 1.9  Take 1 tablet today, Aug. 12, then continue with 1/2 tablet daily.  Return to clinic in 4 weeks.

## 2010-07-14 NOTE — Medication Information (Signed)
Summary: rov/ewj  Anticoagulant Therapy  Managed by: Cloyde Reams, RN, BSN Referring MD: Charlton Haws MD PCP: Dr. Luanna Cole. Lenord Fellers Supervising MD: Juanda Chance MD, Danaysha Kirn Indication 1: Atrial Fibrillation (ICD-427.31) Lab Used: [facility] [place] Site: [address] INR POC 2.3 INR RANGE 2 - 2.5  Dietary changes: no    Health status changes: no    Bleeding/hemorrhagic complications: no    Recent/future hospitalizations: no    Any changes in medication regimen? no    Recent/future dental: no  Any missed doses?: no       Is patient compliant with meds? yes       Allergies (verified): No Known Drug Allergies  Anticoagulation Management History:      The patient is taking warfarin and comes in today for a routine follow up visit.  Positive risk factors for bleeding include an age of 75 years or older.  The bleeding index is 'intermediate risk'.  Positive CHADS2 values include History of HTN and Age > 80 years old.  The start date was 12/28/1999.  Anticoagulation responsible provider: Juanda Chance MD, Smitty Cords.  INR POC: 2.3.  Cuvette Lot#: [id number].  Exp: 11/2010.    Anticoagulation Management Assessment/Plan:      The patient's current anticoagulation dose is Warfarin sodium 5 mg tabs: Use as directed by Anticoagulation Clinic.  The target INR is 2 - 2.5.  The next INR is due 11/05/2009.  Anticoagulation instructions were given to patient.  Results were reviewed/authorized by Cloyde Reams, RN, BSN.  She was notified by Cloyde Reams RN.         Prior Anticoagulation Instructions: INR 1.9  Take 1 tablet today then resume same dosage 1/2 tablet daily.   Recheck in 4 weeks.    Current Anticoagulation Instructions: INR 2.3  Continue on same dosage 1/2 tablet daily.  Recheck in 4 weeks.

## 2010-07-14 NOTE — Medication Information (Signed)
Summary: rov/ewj  Anticoagulant Therapy  Managed by: Cloyde Reams, RN, BSN Referring MD: Charlton Haws MD PCP: Dr. Luanna Cole. Lenord Fellers Supervising MD: Jens Som MD, Arlys John Indication 1: Atrial Fibrillation (ICD-427.31) Lab Used: [facility] [place] Site: [address] INR POC 2.0 INR RANGE 2 - 2.5  Dietary changes: no    Health status changes: no    Bleeding/hemorrhagic complications: no    Recent/future hospitalizations: no    Any changes in medication regimen? no    Recent/future dental: no  Any missed doses?: no       Is patient compliant with meds? yes       Allergies: No Known Drug Allergies  Anticoagulation Management History:      Positive risk factors for bleeding include an age of 71 years or older.  The bleeding index is 'intermediate risk'.  Positive CHADS2 values include History of HTN and Age > 42 years old.  The start date was 12/28/1999.  Anticoagulation responsible provider: Jens Som MD, Arlys John.  INR POC: 2.0.  Cuvette Lot#: [id number].  Exp: 04/2011.    Anticoagulation Management Assessment/Plan:      The patient's current anticoagulation dose is Warfarin sodium 5 mg tabs: Use as directed by Anticoagulation Clinic.  The target INR is 2 - 2.5.  The next INR is due 04/15/2010.  Anticoagulation instructions were given to patient.  Results were reviewed/authorized by Cloyde Reams, RN, BSN.  She was notified by Ilean Skill D candidate.         Prior Anticoagulation Instructions: INR 2.1  Continue on same dosage 1/2 tablet daily.  Recheck in 4 weeks.    Current Anticoagulation Instructions: INR 2.0  continue taking 1/2 tablet everyday. Recheck 4 weeks.

## 2010-07-14 NOTE — Letter (Signed)
Summary: Custom - Lipid  [facility], Main Office  [address]   [place], [place] [address]   Phone: [phone]  Fax: [phone]     September 20, 2009 MRN: [id number]   Judy [name] [address] [place], [place]  [address]   Dear Ms. Fineberg,  We have reviewed your cholesterol results.  They are as follows:     Total Cholesterol:    151 (Desirable: less than 200)       HDL  Cholesterol:     52.00  (Desirable: greater than 40 for men and 50 for women)       LDL Cholesterol:       61  (Desirable: less than 100 for low risk and less than 70 for moderate to high risk)       Triglycerides:       191.0  (Desirable: less than 150)  Our recommendations include:These numbers look good. Continue on the same medicine. Sodium, potassium, blood count, thyroid, kidney and Liver function are normal. Take care, Dr. Leanora Cover.    Call our office at the number listed above if you have any questions.  Lowering your LDL cholesterol is important, but it is only one of a large number of "risk factors" that may indicate that you are at risk for heart disease, stroke or other complications of hardening of the arteries.  Other risk factors include:   A.  Cigarette Smoking* B.  High Blood Pressure* C.  Obesity* D.   Low HDL Cholesterol (see yours above)* E.   Diabetes Mellitus (higher risk if your is uncontrolled) F.  Family history of premature heart disease G.  Previous history of stroke or cardiovascular disease    *These are risk factors YOU HAVE CONTROL OVER.  For more information, visit .  There is now evidence that lowering the TOTAL CHOLESTEROL AND LDL CHOLESTEROL can reduce the risk of heart disease.  The American Heart Association recommends the following guidelines for the treatment of elevated cholesterol:  1.  If there is now current heart disease and less than two risk factors, TOTAL CHOLESTEROL should be less than 200 and LDL CHOLESTEROL should be less than  100. 2.  If there is current heart disease or two or more risk factors, TOTAL CHOLESTEROL should be less than 200 and LDL CHOLESTEROL should be less than 70.  A diet low in cholesterol, saturated fat, and calories is the cornerstone of treatment for elevated cholesterol.  Cessation of smoking and exercise are also important in the management of elevated cholesterol and preventing vascular disease.  Studies have shown that 30 to 60 minutes of physical activity most days can help lower blood pressure, lower cholesterol, and keep your weight at a healthy level.  Drug therapy is used when cholesterol levels do not respond to therapeutic lifestyle changes (smoking cessation, diet, and exercise) and remains unacceptably high.  If medication is started, it is important to have you levels checked periodically to evaluate the need for further treatment options.  Thank you,    [facility] Team

## 2010-07-14 NOTE — Assessment & Plan Note (Signed)
Summary: 1 yr/dmp  Medications Added OSTEO BI-FLEX ADV JOINT SHIELD  TABS (MISC NATURAL PRODUCTS) 1 tab op once daily CLARITIN 10 MG TABS (LORATADINE) 1 tab by mouth once daily      Allergies Added: NKDA  Primary Provider:  Dr. Luanna Cole. [place]   History of Present Illness: Judy [name] returns today for followup.  She has chronic atrial fibrillationon anticoagulation.  She has hypertension which is well controlled. I believe she has had a distant history of coronary artery disease withstent to the RCA in 1996 and a repeat stent to the PDA, OM in 2006. Her EF has been in the 50% range.  Her last Myoview I believe was in2007 and was at low risk.  She is not having significant chest pain.Her INRs have been therapeutic. She has a history of 40% ICA stenosis and needs F/U for this.  I encouraged her to see Dr Baird Kay for her primary care F/U. She needs routine lab work  Current Problems (verified): 1)  Cad, Native Vessel  (ICD-414.01) 2)  Atrial Fibrillation  (ICD-427.31) 3)  Essential Hypertension, Benign  (ICD-401.1) 4)  Mixed Hyperlipidemia  (ICD-272.2) 5)  Obesity  (ICD-278.00) 6)  Coumadin Therapy  (ICD-V58.61)  Current Medications (verified): 1)  Metoprolol Succinate 100 Mg Xr24h-Tab (Metoprolol Succinate) .Marland Kitchen.. 1 Tab By Mouth Two Times A Day 2)  Plavix 75 Mg Tabs (Clopidogrel Bisulfate) .... Take One Tablet By Mouth Daily 3)  Isosorbide Mononitrate Cr 30 Mg Xr24h-Tab (Isosorbide Mononitrate) .... Take One Tablet By Mouth Daily 4)  Lisinopril 10 Mg Tabs (Lisinopril) .... Take One Tablet By Mouth Daily 5)  Vitamin B-12 1000 Mcg Tabs (Cyanocobalamin) .Marland Kitchen.. 1 By Mouth Every Other Day. 6)  Warfarin Sodium 5 Mg Tabs (Warfarin Sodium) .... Use As Directed By Anticoagulation Clinic 7)  Simvastatin 80 Mg Tabs (Simvastatin) .... Take One Tablet By Mouth Daily At Bedtime 8)  Aspirin 81 Mg Tbec (Aspirin) .... Take One Tablet By Mouth Daily 9)  Folic Acid 400 Mcg Tabs (Folic Acid) .Marland Kitchen.. 1 By Mouth  Daily 10)  Furosemide 20 Mg Tabs (Furosemide) .... Take One Tablet By Mouth Daily. 11)  Diltiazem Hcl Er Beads 120 Mg Xr24h-Cap (Diltiazem Hcl Er Beads) .... Take One Capsule By Mouth Daily 12)  Potassium Chloride Cr 10 Meq Cr-Caps (Potassium Chloride) .... Take One Tablet By Mouth Daily 13)  Osteo Bi-Flex Adv Joint Shield  Tabs (Misc Natural Products) .Marland Kitchen.. 1 Tab Op Once Daily 14)  Claritin 10 Mg Tabs (Loratadine) .Marland Kitchen.. 1 Tab By Mouth Once Daily  Allergies (verified): No Known Drug Allergies  Past History:  Past Medical History: Last updated: 08/23/2009 Current Problems:  CAD, NATIVE VESSEL (ICD-414.01) Stent RCA 96' Stent PDA and OM DES 2006 ATRIAL FIBRILLATION (ICD-427.31) ESSENTIAL HYPERTENSION, BENIGN (ICD-401.1) MIXED HYPERLIPIDEMIA (ICD-272.2) OBESITY (ICD-278.00) COUMADIN THERAPY (ICD-V58.61)  Past Surgical History: Last updated: 08/23/2009  PROCEDURE:  Drug-eluting stent placement to the PDA, drug-eluting stent  placement to the first obtuse marginal, StarClose closure of the right common femoral arteriotomy site. Successful drug-eluting stent placement in  both the posterior descending artery and the obtuse marginal. She should be  maintained on Plavix for 6 months. Aspirin and Coumadin will both be continued indefinitely. Coumadin will be resumed this evening. The lesion was directly stented using a 2.5 x 16 mm Taxus deployed at 14 atmospheres. The stent was then post dilated using a 2.75 x 15 mm Quantum at 16 atmospheres. Repeat angiography demonstrated no residual stenosis, no dissection, and TIMI-3 flow to the  distal vasculature. There was no significant stenosis in the jailed AV groove circumflex. Salvadore Farber, M.D. [facility] Electronically Signed  WED/MEDQ  D:  05/17/2005  T:  05/17/2005  Job:  [id number] cc:   Charlton Haws, M.D.    Family History: Last updated: 08/23/2009 Positive for coronary artery disease.  Social History: Last updated: 08/23/2009  She does not smoke,  nor does she consume alcohol.  Review of Systems       Denies fever, malais, weight loss, blurry vision, decreased visual acuity, cough, sputum, SOB, hemoptysis, pleuritic pain, palpitaitons, heartburn, abdominal pain, melena, lower extremity edema, claudication, or rash.   Vital Signs:  Patient profile:   77 year old female Height:      65 inches Weight:      205 pounds BMI:     34.24 Pulse rate:   70 / minute Pulse rhythm:   irregularly irregular Resp:     12 per minute BP sitting:   109 / 50  (left arm)  Vitals Entered By: Judy [name] (August 31, 2009 10:01 AM)  Physical Exam  General:  Affect appropriate Healthy:  appears stated age HEENT: normal Neck supple with no adenopathy JVP normal no bruits no thyromegaly Lungs clear with no wheezing and good diaphragmatic motion Heart:  S1/S2 no murmur,rub, gallop or click PMI normal Abdomen: benighn, BS positve, no tenderness, no AAA no bruit.  No HSM or HJR Distal pulses intact with no bruits No edema Neuro non-focal Skin warm and dry    Impression & Recommendations:  Problem # 1:  CAROTID ARTERY DISEASE (ICD-433.10) F/U duplex no TIA symptoms Her updated medication list for this problem includes:    Plavix 75 Mg Tabs (Clopidogrel bisulfate) .Marland Kitchen... Take one tablet by mouth daily    Warfarin Sodium 5 Mg Tabs (Warfarin sodium) ..... Use as directed by anticoagulation clinic    Aspirin 81 Mg Tbec (Aspirin) .Marland Kitchen... Take one tablet by mouth daily  Orders: Carotid Duplex (Carotid Duplex)  Problem # 2:  CAD, NATIVE VESSEL (ICD-414.01) Stable no angina The following medications were removed from the medication list:    Ramipril 5 Mg Caps (Ramipril) .Marland Kitchen... Take one capsule by mouth daily Her updated medication list for this problem includes:    Metoprolol Succinate 100 Mg Xr24h-tab (Metoprolol succinate) .Marland Kitchen... 1 tab by mouth two times a day    Plavix 75 Mg Tabs (Clopidogrel bisulfate) .Marland Kitchen... Take one tablet by mouth  daily    Isosorbide Mononitrate Cr 30 Mg Xr24h-tab (Isosorbide mononitrate) .Marland Kitchen... Take one tablet by mouth daily    Lisinopril 10 Mg Tabs (Lisinopril) .Marland Kitchen... Take one tablet by mouth daily    Warfarin Sodium 5 Mg Tabs (Warfarin sodium) ..... Use as directed by anticoagulation clinic    Aspirin 81 Mg Tbec (Aspirin) .Marland Kitchen... Take one tablet by mouth daily    Diltiazem Hcl Er Beads 120 Mg Xr24h-cap (Diltiazem hcl er beads) .Marland Kitchen... Take one capsule by mouth daily  Problem # 3:  ATRIAL FIBRILLATION (ICD-427.31) Good rate control.  Discussed Pradaxa with her and she prefers to stay on coumadin.   Her updated medication list for this problem includes:    Metoprolol Succinate 100 Mg Xr24h-tab (Metoprolol succinate) .Marland Kitchen... 1 tab by mouth two times a day    Plavix 75 Mg Tabs (Clopidogrel bisulfate) .Marland Kitchen... Take one tablet by mouth daily    Warfarin Sodium 5 Mg Tabs (Warfarin sodium) ..... Use as directed by anticoagulation clinic    Aspirin 81 Mg Tbec (Aspirin) .Marland KitchenMarland KitchenMarland KitchenMarland Kitchen  Take one tablet by mouth daily  Patient Instructions: 1)  Your physician recommends that you schedule a follow-up appointment in: 6 MONTHS 2)  Your physician recommends that you return for lab work in: WITH DOPPLERS 3)  Your physician has requested that you have a carotid duplex. This test is an ultrasound of the carotid arteries in your neck. It looks at blood flow through these arteries that supply the brain with blood. Allow one hour for this exam. There are no restrictions or special instructions.   EKG Report  Procedure date:  08/31/2009  Findings:      Afib 70 Abnormal ECG

## 2010-07-14 NOTE — Medication Information (Signed)
Summary: rov/tm  Anticoagulant Therapy  Managed by: Cloyde Reams, RN, BSN Referring MD: Charlton Haws MD PCP: Dr. Luanna Cole. Lenord Fellers Supervising MD: Juanda Chance MD, Raziel Koenigs Indication 1: Atrial Fibrillation (ICD-427.31) Lab Used: [facility] [place] Site: [address] INR POC 2.0 INR RANGE 2 - 2.5  Dietary changes: no    Health status changes: no    Bleeding/hemorrhagic complications: no    Recent/future hospitalizations: no    Any changes in medication regimen? no    Recent/future dental: no  Any missed doses?: no       Is patient compliant with meds? yes       Allergies (verified): No Known Drug Allergies  Anticoagulation Management History:      The patient is taking warfarin and comes in today for a routine follow up visit.  Positive risk factors for bleeding include an age of 75 years or older.  The bleeding index is 'intermediate risk'.  Positive CHADS2 values include History of HTN and Age > 75 years old.  The start date was 12/28/1999.  Anticoagulation responsible provider: Juanda Chance MD, Smitty Cords.  INR POC: 2.0.  Exp: 09/2010.    Anticoagulation Management Assessment/Plan:      The patient's current anticoagulation dose is Warfarin sodium 5 mg tabs: Use as directed by Anticoagulation Clinic.  The target INR is 2 - 2.5.  The next INR is due 09/10/2009.  Anticoagulation instructions were given to patient.  Results were reviewed/authorized by Cloyde Reams, RN, BSN.  She was notified by Cloyde Reams RN.         Prior Anticoagulation Instructions: INR 1.9 Today take 5mg  then resume 2.5mg s everyday. Recheck in 3 weeks.    Current Anticoagulation Instructions: INR 2.0  Take 1 tablet today then resume same dosage 0.5 tablet daily.  Recheck in 4 weeks.

## 2010-07-14 NOTE — Medication Information (Signed)
Summary: rov/nb   Anticoagulant Therapy  Managed by: Weston Brass, PharmD Referring MD: Charlton Haws MD PCP: Dr. Luanna Cole. Lenord Fellers Supervising MD: Excell Seltzer MD, Casimiro Needle Indication 1: Atrial Fibrillation (ICD-427.31) Lab Used: [facility] [place] Site: [address] INR POC 2.6 INR RANGE 2 - 2.5  Dietary changes: no    Health status changes: no    Bleeding/hemorrhagic complications: no    Recent/future hospitalizations: no    Any changes in medication regimen? no    Recent/future dental: no  Any missed doses?: no       Is patient compliant with meds? yes       Allergies: No Known Drug Allergies  Anticoagulation Management History:      The patient is taking warfarin and comes in today for a routine follow up visit.  Positive risk factors for bleeding include an age of 75 years or older.  The bleeding index is 'intermediate risk'.  Positive CHADS2 values include History of HTN and Age > 75 years old.  The start date was 12/28/1999.  Anticoagulation responsible Bhavika Schnider: Excell Seltzer MD, Casimiro Needle.  INR POC: 2.6.  Cuvette Lot#: [id number].  Exp: 05/2011.    Anticoagulation Management Assessment/Plan:      The patient's current anticoagulation dose is Warfarin sodium 5 mg tabs: Use as directed by Anticoagulation Clinic.  The target INR is 2 - 2.5.  The next INR is due 06/10/2010.  Anticoagulation instructions were given to patient.  Results were reviewed/authorized by Weston Brass, PharmD.  She was notified by Weston Brass PharmD.         Prior Anticoagulation Instructions: INR 2.3 Continue previos dose of 1/2 tablet everyday Recheck INR in 4 weeks.  Current Anticoagulation Instructions: INR 2.6  Continue same dose of 1/2 tablet every day.  Recheck INR in 4 weeks.

## 2010-07-14 NOTE — Medication Information (Signed)
Summary: rov/ewj  Anticoagulant Therapy  Managed by: Weston Brass, PharmD Referring MD: Charlton Haws MD PCP: Dr. Luanna Cole. Lenord Fellers Supervising MD: Daleen Squibb MD, Maisie Fus Indication 1: Atrial Fibrillation (ICD-427.31) Lab Used: [facility] [place] Site: [address] INR POC 1.6 INR RANGE 2 - 2.5  Dietary changes: yes       Details: may have been eating a little more greens than usual  Health status changes: no    Bleeding/hemorrhagic complications: no    Recent/future hospitalizations: no    Any changes in medication regimen? no    Recent/future dental: no  Any missed doses?: no       Is patient compliant with meds? yes       Allergies: No Known Drug Allergies  Anticoagulation Management History:      The patient is taking warfarin and comes in today for a routine follow up visit.  Positive risk factors for bleeding include an age of 75 years or older.  The bleeding index is 'intermediate risk'.  Positive CHADS2 values include History of HTN and Age > 75 years old.  The start date was 12/28/1999.  Anticoagulation responsible provider: Daleen Squibb MD, Maisie Fus.  INR POC: 1.6.  Cuvette Lot#: [id number].  Exp: 02/2011.    Anticoagulation Management Assessment/Plan:      The patient's current anticoagulation dose is Warfarin sodium 5 mg tabs: Use as directed by Anticoagulation Clinic.  The target INR is 2 - 2.5.  The next INR is due 01/21/2010.  Anticoagulation instructions were given to patient.  Results were reviewed/authorized by Weston Brass, PharmD.  She was notified by Dillard Cannon.         Prior Anticoagulation Instructions: INR 2.3  Continue on same dosage 1/2 tablet daily.  Recheck in 4 weeks.    Current Anticoagulation Instructions: INR 1.6  Take 1 tab today and then resume normal regimen of 1/2 tab daily.  Re-check in 3 weeks.

## 2010-07-14 NOTE — Letter (Signed)
Summary: Appointment - Reminder 2  [facility], Main Office  [address]   [place], [place] [address]   Phone: [phone]  Fax: [phone]     July 07, 2009 MRN: [id number]   Judy [name] [address] [place], [place]  [address]   Dear [name],  Our records indicate that it is time to schedule a follow-up appointment with Dr. Eden Emms. It is very important that we reach you to schedule this appointment. We look forward to participating in your health care needs. Please contact us at the number listed above at your earliest convenience to schedule your appointment.  If you are unable to make an appointment at this time, give [place] a call so we can update our records.  Sincerely,   Migdalia Dk [facility] Scheduling Team

## 2010-07-14 NOTE — Medication Information (Signed)
Summary: rov/ewj  Anticoagulant Therapy  Managed by: Cloyde Reams, RN, BSN Referring MD: Charlton Haws MD PCP: Dr. Luanna Cole. Lenord Fellers Supervising MD: Juanda Chance MD, Bruce Indication 1: Atrial Fibrillation (ICD-427.31) Lab Used: [facility] [place] Site: [address] INR POC 1.9 INR RANGE 2 - 2.5  Dietary changes: no    Health status changes: no    Bleeding/hemorrhagic complications: no    Recent/future hospitalizations: no    Any changes in medication regimen? no    Recent/future dental: no  Any missed doses?: no       Is patient compliant with meds? yes       Allergies (verified): No Known Drug Allergies  Anticoagulation Management History:      The patient is taking warfarin and comes in today for a routine follow up visit.  Positive risk factors for bleeding include an age of 2 years or older.  The bleeding index is 'intermediate risk'.  Positive CHADS2 values include History of HTN and Age > 37 years old.  The start date was 12/28/1999.  Anticoagulation responsible provider: Juanda Chance MD, Smitty Cords.  INR POC: 1.9.  Cuvette Lot#: [id number].  Exp: 10/2010.    Anticoagulation Management Assessment/Plan:      The patient's current anticoagulation dose is Warfarin sodium 5 mg tabs: Use as directed by Anticoagulation Clinic.  The target INR is 2 - 2.5.  The next INR is due 10/08/2009.  Anticoagulation instructions were given to patient.  Results were reviewed/authorized by Cloyde Reams, RN, BSN.  She was notified by Cloyde Reams RN.         Prior Anticoagulation Instructions: INR 2.0  Take 1 tablet today then resume same dosage 0.5 tablet daily.  Recheck in 4 weeks.    Current Anticoagulation Instructions: INR 1.9  Take 1 tablet today then resume same dosage 1/2 tablet daily.   Recheck in 4 weeks.

## 2010-07-21 DIAGNOSIS — I4891 Unspecified atrial fibrillation: Secondary | ICD-10-CM

## 2010-07-21 DIAGNOSIS — Z7901 Long term (current) use of anticoagulants: Secondary | ICD-10-CM

## 2010-08-09 ENCOUNTER — Encounter: Payer: Self-pay | Admitting: Cardiovascular Disease

## 2010-08-09 DIAGNOSIS — I4891 Unspecified atrial fibrillation: Secondary | ICD-10-CM

## 2010-08-10 ENCOUNTER — Encounter (INDEPENDENT_AMBULATORY_CARE_PROVIDER_SITE_OTHER): Payer: Medicare Other

## 2010-08-10 ENCOUNTER — Encounter: Payer: Self-pay | Admitting: Cardiovascular Disease

## 2010-08-10 DIAGNOSIS — I4891 Unspecified atrial fibrillation: Secondary | ICD-10-CM

## 2010-08-10 DIAGNOSIS — Z7901 Long term (current) use of anticoagulants: Secondary | ICD-10-CM

## 2010-08-10 LAB — CONVERTED CEMR LAB: POC INR: 2.1

## 2010-08-18 NOTE — Medication Information (Signed)
Summary: rov/tm  Anticoagulant Therapy  Managed by: Weston Brass, PharmD Referring MD: Charlton Haws MD PCP: Dr. Luanna Cole. Lenord Fellers Supervising MD: Excell Seltzer MD, Casimiro Needle Indication 1: Atrial Fibrillation (ICD-427.31) Lab Used: [facility] [place] Site: [address] INR POC 2.1 INR RANGE 2 - 2.5  Dietary changes: no    Health status changes: no    Bleeding/hemorrhagic complications: no    Recent/future hospitalizations: no    Any changes in medication regimen? no    Recent/future dental: no  Any missed doses?: no       Is patient compliant with meds? yes       Allergies: No Known Drug Allergies  Anticoagulation Management History:      The patient is taking warfarin and comes in today for a routine follow up visit.  Positive risk factors for bleeding include an age of 75 years or older.  The bleeding index is 'intermediate risk'.  Positive CHADS2 values include History of HTN and Age > 3 years old.  The start date was 12/28/1999.  Anticoagulation responsible provider: Excell Seltzer MD, Casimiro Needle.  INR POC: 2.1.  Cuvette Lot#: [id number].  Exp: 06/2011.    Anticoagulation Management Assessment/Plan:      The patient's current anticoagulation dose is Warfarin sodium 5 mg tabs: Use as directed by Anticoagulation Clinic.  The target INR is 2 - 2.5.  The next INR is due 09/08/2010.  Anticoagulation instructions were given to patient.  Results were reviewed/authorized by Weston Brass, PharmD.  She was notified by Margot Chimes PharmD Candidate.         Prior Anticoagulation Instructions: INR 2.1 Continue 2.5mg s everyday. Recheck in 4 weeks.   Current Anticoagulation Instructions: INR 2.1   Continue to take 1/2 tablet everyday.  Recheck INR in 4 weeks.

## 2010-08-26 ENCOUNTER — Encounter: Payer: Self-pay | Admitting: Cardiovascular Disease

## 2010-09-08 ENCOUNTER — Ambulatory Visit (INDEPENDENT_AMBULATORY_CARE_PROVIDER_SITE_OTHER): Payer: Medicare Other | Admitting: Cardiovascular Disease

## 2010-09-08 ENCOUNTER — Encounter: Payer: Self-pay | Admitting: Cardiovascular Disease

## 2010-09-08 ENCOUNTER — Ambulatory Visit (INDEPENDENT_AMBULATORY_CARE_PROVIDER_SITE_OTHER): Payer: Medicare Other | Admitting: *Deleted

## 2010-09-08 DIAGNOSIS — I6529 Occlusion and stenosis of unspecified carotid artery: Secondary | ICD-10-CM

## 2010-09-08 DIAGNOSIS — Z7901 Long term (current) use of anticoagulants: Secondary | ICD-10-CM | POA: Insufficient documentation

## 2010-09-08 DIAGNOSIS — I4891 Unspecified atrial fibrillation: Secondary | ICD-10-CM

## 2010-09-08 DIAGNOSIS — I10 Essential (primary) hypertension: Secondary | ICD-10-CM

## 2010-09-08 DIAGNOSIS — E782 Mixed hyperlipidemia: Secondary | ICD-10-CM

## 2010-09-08 NOTE — Assessment & Plan Note (Signed)
Well controlled.  Continue current medications and low sodium Dash type diet.    

## 2010-09-08 NOTE — Patient Instructions (Signed)
Your physician recommends that you schedule a follow-up appointment in: 6 months  

## 2010-09-08 NOTE — Assessment & Plan Note (Signed)
Consider F/U duplex in 2 years

## 2010-09-08 NOTE — Patient Instructions (Signed)
Continue on same dosage 1/2 tablet daily.  Recheck in 4 weeks.  

## 2010-09-08 NOTE — Assessment & Plan Note (Addendum)
At goal with no side effects.  Continue statin  Labs with next visit in coumadin lab

## 2010-09-08 NOTE — Progress Notes (Signed)
Bosie Clos is seen today in followup for atypical chest pain chronic left bundle branch block and chronic atrial fibrillation.  She has failed cardioversions and is now strictly anticoagulation rate control.  She has not had any significant palpitations PND or orthopnea.  She's been having some diaphoresis and sweats.  I suspect is more hormonal and not related to her heart.  He's had some atypical chest pain.  It is sharp on the right and left side of her chest is fleeting it can occur anytime it is nonexertional.  She had a normal heart catheterization in 2004 in her last normal Myoview was in 2008.  Check chronic left bundle branch block but no evidence of high-grade heart block or syncope.  Her INRs have been therapeutic without bleeding diathesis and she's not had any significant TIAs  Echo 03/18/10 moderate biatrial enlargement moderate MR and normal EF  ROS: Denies fever, malais, weight loss, blurry vision, decreased visual acuity, cough, sputum, SOB, hemoptysis, pleuritic pain, palpitaitons, heartburn, abdominal pain, melena, lower extremity edema, claudication, or rash.   General: Affect appropriate Healthy:  appears stated age HEENT: normal Neck supple with no adenopathy JVP normal no bruits no thyromegaly Lungs clear with no wheezing and good diaphragmatic motion Heart:  S1/S2 no murmur,rub, gallop or click PMI normal Abdomen: benighn, BS positve, no tenderness, no AAA no bruit.  No HSM or HJR Distal pulses intact with no bruits No edema Neuro non-focal Skin warm and dry No muscular weakness   Current Outpatient Prescriptions  Medication Sig Dispense Refill  . aspirin 81 MG tablet Take 81 mg by mouth daily.        . clopidogrel (PLAVIX) 75 MG tablet Take 75 mg by mouth daily.        Marland Kitchen diltiazem (DILACOR XR) 120 MG 24 hr capsule Take 120 mg by mouth daily.        . furosemide (LASIX) 20 MG tablet Take 20 mg by mouth daily.        . isosorbide mononitrate (IMDUR) 30 MG 24 hr tablet  Take 30 mg by mouth daily.        Marland Kitchen lisinopril (PRINIVIL,ZESTRIL) 10 MG tablet Take 10 mg by mouth daily.        . Loratadine (CLARITIN) 10 MG CAPS 1 tab po qd       . metoprolol (TOPROL-XL) 100 MG 24 hr tablet Take 100 mg by mouth 2 (two) times daily.       . Multiple Vitamins-Minerals (CENTRUM PO) 1 tab po qd       . Multiple Vitamins-Minerals (OSTEO COMPLEX PO) 1 tab po qd       . potassium chloride (KLOR-CON) 10 MEQ CR tablet Take 10 mEq by mouth daily.        . pravastatin (PRAVACHOL) 80 MG tablet Take 80 mg by mouth daily.        Marland Kitchen warfarin (COUMADIN) 5 MG tablet Take by mouth as directed.        . folic acid (FOLVITE) 400 MCG tablet Take 400 mcg by mouth daily.        . vitamin B-12 (CYANOCOBALAMIN) 1000 MCG tablet Take 1,000 mcg by mouth every other day.          Allergies  Review of patient's allergies indicates no known allergies.  Electrocardiogram:  Assessment and Plan

## 2010-09-08 NOTE — Assessment & Plan Note (Signed)
Good anticoagualiton and rate control.

## 2010-10-03 ENCOUNTER — Other Ambulatory Visit: Payer: Self-pay | Admitting: Cardiovascular Disease

## 2010-10-07 ENCOUNTER — Ambulatory Visit (INDEPENDENT_AMBULATORY_CARE_PROVIDER_SITE_OTHER): Payer: Medicare Other | Admitting: *Deleted

## 2010-10-07 ENCOUNTER — Other Ambulatory Visit (INDEPENDENT_AMBULATORY_CARE_PROVIDER_SITE_OTHER): Payer: Medicare Other | Admitting: *Deleted

## 2010-10-07 DIAGNOSIS — Z79899 Other long term (current) drug therapy: Secondary | ICD-10-CM

## 2010-10-07 DIAGNOSIS — E7800 Pure hypercholesterolemia, unspecified: Secondary | ICD-10-CM

## 2010-10-07 DIAGNOSIS — I4891 Unspecified atrial fibrillation: Secondary | ICD-10-CM

## 2010-10-07 LAB — LIPID PANEL
Cholesterol: 213 mg/dL — ABNORMAL HIGH (ref 0–200)
HDL: 48.1 mg/dL (ref >39.00)
Total CHOL/HDL Ratio: 4
VLDL: 71.8 mg/dL — ABNORMAL HIGH (ref 0.0–40.0)

## 2010-10-07 LAB — HEPATIC FUNCTION PANEL
Albumin: 3.8 g/dL (ref 3.5–5.2)
Alkaline Phosphatase: 64 U/L (ref 39–117)

## 2010-10-07 LAB — LDL CHOLESTEROL, DIRECT: Direct LDL: 128.4 mg/dL

## 2010-10-10 ENCOUNTER — Encounter: Payer: Self-pay | Admitting: *Deleted

## 2010-10-25 NOTE — Assessment & Plan Note (Signed)
[facility]                            CARDIOLOGY OFFICE NOTE   Judy, [name]                      MRN:          [id number]  DATE:08/19/2008                            DOB:          04-Jan-1931    Judy [name] returns today for followup.  She has chronic atrial fibrillation  on anticoagulation.  She has hypertension which is well controlled.   I believe she has had a distant history of coronary artery disease with  stent to the RCA in 1996 and a repeat stent to the PDA, OM in 2006.   Her EF has been in the 50% range.  Her last Myoview I believe was in  2007 and was at low risk.  She is not having significant chest pain.  Her INRs have been therapeutic.   She has been on aspirin and warfarin and not needing to take Plavix.   REVIEW OF SYSTEMS:  Remarkable for no significant chest pain, PND,  orthopnea, or lower extremity edema.  No palpitations, syncope, no  bleeding diathesis.  No weight loss, fever, or depression.   MEDICATIONS:  1. Plavix 75 a day.  2. Isosorbide 30 a day.  3. Lisinopril 10 a day.  4. B12.  5. Toprol 100 a day.  6. Warfarin as directed.  7. Altace 5 a day.  8. Zocor 80 a day.  9. Aspirin.  10.Folic acid.  11.Lasix 20 a day.  12.Cardizem 120 a day.   PHYSICAL EXAMINATION:  GENERAL:  Remarkable for an overweight female in  no distress.  She is in AFib at a rate of 80.  VITAL SIGNS:  Blood pressure is 130/80, respiratory rate 16.  HEENT:  Unremarkable.  Carotids are normal without bruit.  No  lymphadenopathy, thyromegaly, or JVP elevation.  LUNGS:  Clear.  Good diaphragmatic motion.  No wheezing.  S1 and S2 with  normal heart sounds.  PMI normal.  ABDOMEN:  Benign.  Bowel sounds positive.  No AAA.  No tenderness.  No  bruit.  No hepatosplenomegaly.  No hepatojugular reflux or tenderness.  EXTREMITIES:  Distal pulses are intact.  Trace edema.  NEURO:  Nonfocal.  SKIN:  Warm and dry.  MUSCULOSKELETAL:  No muscular  weakness.   EKG shows atrial fibrillation, nonspecific ST-T wave changes.   IMPRESSION:  1. Atrial fibrillation, good rate control.  Continue current dose of      atrioventricular nodal blocking drugs.  2. Anticoagulation.  Follow up in the Coumadin Clinic.  INRs have been      therapeutic.  3. History of coronary artery disease with previous stents to the      obtuse marginal right and posterior descending artery , continue      aspirin and Plavix.  Consider followup Myoview in a year.  4. Hyperlipidemia.  Continue Zocor.  Lipid liver profile to be done at      her next Coumadin appointment.  5. Hypertension, currently well controlled.  Continue current dose of      diuretic and lisinopril.  Check BMET at her next Coumadin      appointment to  make sure her potassium and creatinine are okay.      Overall, I think Judy [name] is doing well and I will see her back in 6      months.     Noralyn Pick. Eden Emms, MD, [facility]  Electronically Signed    PCN/MedQ  DD: 08/19/2008  DT: 08/20/2008  Job #: [id number]

## 2010-10-28 ENCOUNTER — Ambulatory Visit (INDEPENDENT_AMBULATORY_CARE_PROVIDER_SITE_OTHER): Payer: Medicare Other | Admitting: *Deleted

## 2010-10-28 DIAGNOSIS — I4891 Unspecified atrial fibrillation: Secondary | ICD-10-CM

## 2010-10-28 NOTE — H&P (Signed)
NAME:  Judy [name], WIN NO.:  [id number]   MEDICAL RECORD NO.:  [id number]          PATIENT TYPE:  OIB   LOCATION:  2899                         FACILITY:  [facility]   PHYSICIAN:  Salvadore Farber, M.D. LHCDATE OF BIRTH:  09-Jul-1930   DATE OF ADMISSION:  05/17/2005  DATE OF DISCHARGE:                                HISTORY & PHYSICAL   PRIMARY CARDIOLOGIST:  Dr. Charlton Haws   PATIENT PROFILE:  A 77 year old white female with history of CAD who  presents for PCI following recent catheterization following significant OM  and PDA disease.   PROBLEM LIST:  1.  Coronary artery disease.      1.  Status post inferior myocardial infarction with PCI to the right          coronary artery 1996 with J&J stent.      2.  Catheterization on January 18, 2004 revealing medically manageable          disease.      3.  May 15, 2005 cardiac catheterization revealing 70-80% lesion in          the ostial D1, 70% lesion in the obtuse marginal 1, a patent stent          in the right coronary artery, 70-80% stenosis in the posterior          descending artery.  2.  Atrial fibrillation.  3.  Hypertension.  4.  Hyperlipidemia.  5.  Obesity.   ALLERGIES:  No known drug allergies.   HISTORY OF PRESENT ILLNESS:  A 77 year old white female with history of CAD  as stated in the problem list.  She recently saw Dr. Eden Emms secondary to  exertional chest pain.  Underwent cardiac catheterization on December 4  revealing new OM1 and PDA disease.  This was felt to be percutaneously  manageable and she presents today for PCI with Dr. Samule Ohm.  She has not had  any chest pain since her catheterization and her groin has not caused her  any problems and has healed up well.   CURRENT MEDICATIONS:  1.  Zocor 80 mg q.h.s.  2.  Imdur 15 mg daily.  3.  Plavix 75 mg daily.  4.  Zantac 150 mg p.r.n.  5.  Aspirin 81 mg daily.  6.  Toprol XL 100 mg b.i.d.  7.  Lasix 20 mg daily.  8.  Potassium  chloride 10 mEq daily.  9.  Coumadin as directed, now on hold.  10. Altace 5 mg daily.  11. Folic acid 400 mcg daily.  12. Osteo Bi-Flex daily.   FAMILY HISTORY:  Mother died at age 47 of old age.  Father died in his 71s  of motor vehicle accident.   SOCIAL HISTORY:  She lives in [place] by herself.  She is retired.  She  has never smoked.  Does not use alcohol or drugs and does not routinely  exercise.   REVIEW OF SYSTEMS:  Positive for chest pain, shortness of breath.  All other  systems reviewed and negative.   PHYSICAL EXAMINATION:  VITAL SIGNS:  Temperature 96.9,  heart rate 78,  respirations 18, blood pressure 130/71.  She is 65 inches tall.  She weighs  200 pounds.  GENERAL:  Pleasant white female in no acute distress.  Awake, alert, and  oriented x3.  NECK:  Normal carotid upstrokes.  No bruits, JVD.  LUNGS:  Respirations regular, unlabored.  Clear to auscultation.  CARDIAC:  Regular S1, S2.  No S3, S4, murmurs.  ABDOMEN:  Round, soft, nontender, nondistended.  Bowel sounds present x4.  EXTREMITIES:  Warm, dry, pink.  No clubbing, cyanosis, edema.  Dorsalis  pedis, posterior tibial pulses 2+ and equal bilaterally.  The right groin  site which was used for catheterization is clear of bleeding, bruits, or  hematoma.   Chest x-ray shows cardiomegaly without acute abnormality.   LABORATORIES:  Hemoglobin 13.7, hematocrit 39.9, WBC 6.1, platelets 214.  All other laboratories are pending.   ASSESSMENT/PLAN:  1.  Coronary artery disease.  She has recently underwent cardiac      catheterization with Dr. Eden Emms revealing obtuse marginal 1 and      posterior descending artery disease.  She is here today for PCI.      Continue aspirin, Plavix, beta blocker, ACE inhibitor, statin and      nitrates.  2.  Hypertension, stable.  3.  Hyperlipidemia.  Will check lipids and LFTs.  Continue statin.  4.  Atrial fibrillation.  Continue beta blocker.  Hold Coumadin.       Ok Anis, NP      Salvadore Farber, M.D. [facility]  Electronically Signed    CRB/MEDQ  D:  05/17/2005  T:  05/17/2005  Job:  [id number]

## 2010-10-28 NOTE — Assessment & Plan Note (Signed)
[facility]                              CARDIOLOGY OFFICE NOTE   JARRAH, Judy [name]                      MRN:          [id number]  DATE:02/14/2006                            DOB:          April 09, 1931    Heath returns today for followup.  She has chronic AFib and is on Coumadin.  She has a history of coronary artery disease with stents to the right  coronary artery.  She has had false-positive Myoviews in the past and we  have primarily been guided by her symptoms.   Despite being on aspirin, Plavix, and Coumadin, she has not had any  significant bleeding problems and really does not even bruise that easily.   She does not notice any palpitations, PND, orthopnea, or chest pain.   EXAMINATION:  VITAL SIGNS:  She is in AFib at a rate of 80.  LUNGS:  Clear.  NECK:  Carotids normal.  HEART:  S1, S2.  Normal heart sounds.  ABDOMEN:  Benign.  LOWER EXTREMITIES:  Good pulses, no edema.   IMPRESSION:  Stable coronary artery disease, stenting of the right coronary  artery.  Continue aspirin and Plavix.  She will continue her Toprol 100 mg  b.i.d. for rate control and for her coronary disease.  She is following in  the Coumadin clinic with good INRs.  We will switch her Altace to lisinopril  to save some money.   I will see her back in 3 months.                               Noralyn Pick. Eden Emms, MD, [facility]    PCN/MedQ  DD:  02/14/2006  DT:  02/14/2006  Job #:  [id number]

## 2010-10-28 NOTE — Discharge Summary (Signed)
[place]  Patient:    Judy [name], Judy [name]                      MRN: [id number] Adm. Date:  [id number] Disc. Date: 12/31/99 Attending:  Lewayne Bunting Dictator:   Dian Queen, P.A.C. [facility]                  Referring Physician Discharge [place]  HISTORY OF PRESENT ILLNESS:  Essentially, the problem is that of a 77 year old white female with known coronary disease per prior DMI in January 1996, treated with stent angioplasty.  Re-look in March 2000 revealed 50% left main, 50-60% LAD, 60-70% DX-1, 40% marginal, and wide patency at the previously-stented RCA with no significant flow reducing lesions in the RCA. Her ejection fraction was preserved at 55%.  She was treated medically at that time.  She is admitted now with a new-onset atrial fibrillation with rapid rate.  ACCESSORY CLINICAL FINDINGS:  Electrolytes and renal function totally within normal limits.  LFTs were normal.  Troponins were low.  Hemoglobin 13.4 with hematocrit 38.6 and white count 8000.  TSH was 2.113.  Chest x-ray revealed no acute processes.  A 2-D echo revealed EF of 60%, normal LV wall thickness, normal left atrial size, 1+ MR, 1+ TR, and moderate pulmonary hypertension.  COURSE IN THE HOSPITAL:  Patient was admitted and converted spontaneously to sinus rhythm.  We stopped her Norvasc and increased her Toprol.  She held sinus rhythm for 48 hours thereafter and was felt ready for discharge.  We heparinized her and were Coumadinizing her.  At the time of discharge she was doing well.  FINAL DIAGNOSES: 1. New-onset atrial fibrillation - converted spontaneously to sinus rhythm. 2. Coronary artery disease with no recent clinical angina. 3. Controlled hypertension.  DISPOSITION:  Patient was discharged home.  MEDICATIONS: 1. Toprol XL 100 mg a.m. and 50 mg p.m. 2. Zocor 40 mg q.h.s. 3. Aspirin 81 mg q.d. 4. Coumadin 5 mg q.d.  FOLLOW-UP:  We will check a pro time in the  anticoagulation clinic in four days.  She will follow up with Dr. Eden Emms in three to four weeks. DD:  12/31/99 TD:  12/31/99 Job: [id number]

## 2010-10-28 NOTE — Cardiovascular Report (Signed)
NAME:  Judy [name], COLTRIN NO.:  [id number]   MEDICAL RECORD NO.:  [id number]          PATIENT TYPE:  OIB   LOCATION:  6533                         FACILITY:  [facility]   PHYSICIAN:  Salvadore Farber, M.D. LHCDATE OF BIRTH:  1930-09-03   DATE OF PROCEDURE:  05/17/2005  DATE OF DISCHARGE:                              CARDIAC CATHETERIZATION   PROCEDURE:  Drug-eluting stent placement to the PDA, drug-eluting stent  placement to the first obtuse marginal, StarClose closure of the right  common femoral arteriotomy site.   INDICATIONS:  Ms. Doukas is a 77 year old woman with atrial fibrillation who  presents with fatigue and exertional chest discomfort. She underwent  diagnostic angiography by Dr. Eden Emms. This demonstrated a 70% stenosis of  the first obtuse marginal and an 80% stenosis of the PDA. She was referred  percutaneous intervention on these.   PROCEDURAL TECHNIQUE:  Informed consent was obtained. Under 1% lidocaine  local anesthesia, a 6-French sheath was placed in the right common femoral  artery using modified Seldinger technique. Anticoagulation was initiated  with bivalirudin.  ACT was confirmed to be greater than 225 seconds. The  patient had been maintained on Plavix for 5 days prior to the procedure.   The diagnostic images were somewhat unclear as to the severity of the  circumflex disease. I, therefore, began with a 5-French diagnostic JL-4  catheter. Angiography of the left system was obtained. This demonstrated  that the lesion was in fact approximately 70%. I decided to proceed with  percutaneous revascularization. A 6-French Voda left 3.5 guide was advanced  over wire and engaged the ostial of the left main. A Prowater wire was  advanced to the distal obtuse marginal without difficulty. The lesion was  directly stented using a 2.5 x 16 mm Taxus deployed at 14 atmospheres. The  stent was then post dilated using a 2.75 x 15 mm Quantum at 16  atmospheres.  Repeat angiography demonstrated no residual stenosis, no dissection, and  TIMI-3 flow to the distal vasculature. There was no significant stenosis in  the jailed AV groove circumflex.   Attention was then turned to the PDA. The guide was exchanged over wire for  a JR-4 guide. This was engaged in the ostium of the RCA. A Prowater wire was  advanced to the distal PDA without difficulty. The lesion was predilated  using a 2 x 9 mm Maverick at 6 atmospheres. It was then stented using a 2.5  x 12 mm Taxus deployed a 12 atmospheres. The stent was then post dilated  using a 2.5 x 12 mm Quantum at 16 atmospheres. Final angiography  demonstrated no residual stenosis, no dissection, and TIMI-3 flow of the  distal vasculature. There is approximately a 40% stenosis downstream of the  stented segment.   The arteriotomy was then closed using a StarClose device. Complete  hemostasis was obtained. The patient was then transferred to the holding  room in stable condition.   COMPLICATIONS:  None.   IMPRESSION AND RECOMMENDATIONS:  Successful drug-eluting stent placement in  both the posterior descending artery and the obtuse marginal. She should  be  maintained on Plavix for 6 months. Aspirin and Coumadin will both be  continued indefinitely. Coumadin will be resumed this evening.      Salvadore Farber, M.D. [facility]  Electronically Signed     WED/MEDQ  D:  05/17/2005  T:  05/17/2005  Job:  [id number]   cc:   Charlton Haws, M.D.  [address]  [address]  [place]  [place] [address]   Luanna Cole. Lenord Fellers, M.D.  Fax: [phone]

## 2010-10-28 NOTE — Cardiovascular Report (Signed)
NAMEMARIELYS, [name]                         ACCOUNT NO.:  [id number]   MEDICAL RECORD NO.:  [id number]                   PATIENT TYPE:  INP   LOCATION:  2028                                 FACILITY:  [facility]   PHYSICIAN:  Charlies Constable, M.D. [facility]              DATE OF BIRTH:  [birth year]   DATE OF PROCEDURE:  01/18/2004  DATE OF DISCHARGE:                              CARDIAC CATHETERIZATION   INDICATIONS FOR PROCEDURE:  The patient is 77 years old and has had  documented nonobstructive coronary artery disease at catheterization and  recently was admitted with shortness of breath and rapid atrial  fibrillation.  She has had a previous history of atrial fibrillation and had  a cardioversion recently and she has been on Coumadin.  She was scheduled  for angiography for evaluation of an ischemic equivalent to her shortness of  breath.   DESCRIPTION OF PROCEDURE:  Right heart catheterization was performed  percutaneous to the right femoral vein using a venous sheath and Swan-Ganz  thermodilution catheter.  Left heart catheterization was performed  percutaneously via the right femoral artery using arterial sheath and 6  French preformed coronary catheters.  A front wall arterial puncture was  performed and Omnipaque contrast was used.  We were unable to close the  right femoral artery due to a stick below the bifurcation.  The patient  tolerated the procedure well and left the laboratory in satisfactory  condition.   RESULTS:  1. Hemodynamic data.  The right atrial pressure was 9 mean, the pulmonary     artery pressure was 40/18 with a mean of 28.  Pulmonary wedge pressure     was 16 mean.  Left ventricular pressure was 157/16 and the aortic     pressure was 157/91 with a mean of 123.  Cardiac output/cardiac index was     3.2/1.7 Liters/minute/meter squared by Fick.   1. Coronary angiography data.     A. The left main coronary artery is free of significant disease.     B. Left anterior  descending artery gave rise to two diagonal branches and        two septal perforators.  There was 40% narrowing in the proximal LAD.     C. The circumflex artery gave rise to a marginal branch and a        posterolateral branch. There was 70 to 80% narrowing in the proximal        portion of the marginal branch.     D. The right coronary artery was a moderate size vessel that gave rise to        a right ventricular branch, a posterior descending branch, and a        posterolateral branch. There was 40% narrowing in the mid right        coronary artery.   1. The left ventriculogram performed in the RAO projection showed slight  hypokinesis of the inferobasal wall.  The overall wall motion was good     with an estimated ejection fraction of 55 to 60%.   CONCLUSION:  1. Mostly nonobstructive coronary artery disease with 40% narrowing in the     proximal LAD, 70-80% narrowing in the circumflex marginal vessel, 40%     narrowing in the proximal to mid right coronary artery, and slight     inferobasal wall hypokinesis.  2. Some mildly elevated pulmonary wedge pressure.   RECOMMENDATIONS:  The lesion in the circumflex artery is borderline and I am  not certain it is enough to cause ischemia.  I would recommend initial  medical therapy and evaluation with an outpatient Cardiolite scan.  The  patient also has mildly elevated filling pressures, this may be related to  her atrial fibrillation and possibly some diastolic dysfunction. She also  has atrial fibrillation that has been refractory to cardioversion and the  plan is to put her on a rhythm control medication.  I discussed the case  with Doylene Canning. Ladona Ridgel, M.D. today and since her resting EKG shows a QT  interval of 472, he recommended not using Tikosyn, but rather using  amiodarone.  This could be started tomorrow if her anticoagulation has been  continuous and I do not have those records to document that tonight.  I will  start her  back on heparin after her sheath is removed and will resume  Coumadin tonight and let Dr. Eden Emms make a decision regarding starting  amiodarone tomorrow.                                               Charlies Constable, M.D. [facility]    BB/MEDQ  D:  01/18/2004  T:  01/19/2004  Job:  [id number]   cc:   Luanna Cole. Lenord Fellers, M.D.  [address]  [place]  [place] [address]  Fax: [phone]   Charlton Haws, M.D.   Duke Salvia, M.D.

## 2010-10-28 NOTE — Cardiovascular Report (Signed)
NAME:  Judy [name], Judy [name] NO.:  [id number]   MEDICAL RECORD NO.:  [id number]          PATIENT TYPE:  OIB   LOCATION:  1962                         FACILITY:  [facility]   PHYSICIAN:  Charlton Haws, M.D.     DATE OF BIRTH:  10/21/1930   DATE OF PROCEDURE:  05/15/2005  DATE OF DISCHARGE:                              CARDIAC CATHETERIZATION   Coronary arteriography   INDICATIONS:  Previous J&J stent to the right coronary artery, recurrent  angina.   Cine catheterization done with 5-French catheters from the right femoral  artery.   Left main coronary artery had a 30% tubular stenosis.   Left anterior descending artery had a 40% lesion in the mid vessel just  after the takeoff of the first diagonal branch.  The first diagonal branch  had a 70-80% ostial lesion.  The distal LAD was diffusely diseased.   The circumflex coronary artery had 30% tubular disease proximally.  The  first obtuse marginal branch had a 70% somewhat hazy lesion.   The right coronary artery was dominant.  There was 30% multi discrete  lesions in the proximal portion.  The mid vessel stent was widely patent.  The PDA had a 70-80% discrete lesion.   RAO VENTRICULOGRAPHY:  RAO ventriculography showed inferior wall hypokinesis  as well as global hypokinesis.  EF was in the 40% range.  There was no  gradient across aortic valve and no MR.  Aortic pressure was 150/76, LV  pressure was 150/10.   IMPRESSION:  I do not think the patient's left anterior descending disease  is critical enough to warrant CABG.  She has had a nice result with her J&J  stent for many years.  She has relatively new onset angina.  I think the  best option currently would be to refer her for angioplasty of the obtuse  marginal and posterior descending artery and see how our symptoms are.  I  think the ostial and diagonal lesion will be stable.   I will talk to Dr. Geralynn Rile about these films and hopefully have her done  in the  inpatient laboratory in 48 hours.   Will continue to hold her Coumadin.  She is still in atrial fibrillation.  We will start her on Plavix.  I suspect that after her angioplasty she will  go home on aspirin, Plavix, and Coumadin.  She is a good candidate for this  and very compliant.           ______________________________  Charlton Haws, M.D.     PN/MEDQ  D:  05/15/2005  T:  05/15/2005  Job:  [id number]

## 2010-10-28 NOTE — H&P (Signed)
Judy [name], BECKUM                         ACCOUNT NO.:  [id number]   MEDICAL RECORD NO.:  [id number]                   PATIENT TYPE:  INP   LOCATION:  1823                                 FACILITY:  [facility]   PHYSICIAN:  Olga Millers, M.D. [facility]            DATE OF BIRTH:  1931/05/03   DATE OF ADMISSION:  01/16/2004  DATE OF DISCHARGE:                                HISTORY & PHYSICAL   HISTORY OF PRESENT ILLNESS:  Judy [name] is a very pleasant, 77 year old  female with past medical history of coronary artery disease, paroxysmal  atrial fibrillation, hypertension and hyperlipidemia who presents with chest  pain and recurrent atrial fibrillation.  The patient has had prior inferior  infarct with PCI of her right coronary artery.  Her most recent  catheterization in March 2000, revealed 50% left main.  There was a 50-60%  LAD as well as a 50-60% osteal first diagonal.  The distal LAD had a 20-30%  lesion.  The circumflex was nondominant and there was a 40% OM.  The right  coronary artery was widely patent.  The ejection fraction was 55%.  The  patient has had problems with atrial fibrillation.  She did undergo  cardioversion recently on December 17, 2003, to normal sinus rhythm; however, her  atrial fibrillation recurred 4 days later.  This apparently was discussed  with Dr. Eden Emms and Dr. Graciela Husbands and plans were made for outpatient cardiac  catheterization in late August followed admission for Tikosyn load.  However, since converting back to atrial fibrillation, the patient has  noticed increasing dyspnea on exertion, orthopnea as well as PND.  She has  also had palpitations.  Today, she had substernal chest pain that was  described as a tightness.  It lasted for approximately 30 minutes.  There  was no association of shortness of breath, no nausea, vomiting, no  diaphoresis.  She therefore presented to the emergency room and is presently  pain free.   MEDICATIONS:  1. Vitamins.  2.  Toprol 100 mg p.o. b.i.d.  3. Coumadin as directed.  4. Aspirin 81 mg p.o. q.d.  5. Zocor 40 mg p.o. q.d.  6. Altace 5 mg p.o. q.h.s.   ALLERGIES:  No known drug allergies.   SOCIAL HISTORY:  She does not smoke, nor does she consume alcohol.   FAMILY HISTORY:  Positive for coronary artery disease.   PAST MEDICAL HISTORY:  Hypertension and hyperlipidemia, but there is no  diabetes mellitus.  She does have a history of coronary artery disease as  outlined in the HPI as well as atrial fibrillation.  She has had no previous  surgeries.   REVIEW OF SYMPTOMS:  CONSTITUTIONAL:  She denies any fevers, headaches or  chills.  CARDIOPULMONARY:  There is no productive cough, although she does  have some sinus drainage.  There is no hemoptysis.  She does describe  orthopnea as well as PND, but there  is no recent increase in pedal edema.  There is no claudication noted.  GASTROINTESTINAL:  She denies dysphagia,  odynophagia, melena or hematochezia.  GENITOURINARY:  There is no dysuria or  hematuria.  NEUROLOGIC:  There is no rash or seizure activity.  The  remaining systems are negative.   PHYSICAL EXAMINATION:  VITAL SIGNS:  Blood pressure 162/106 on admission  that improved to 138/74.  She is afebrile.  Pulse is 88.  GENERAL:  She is well-developed and somewhat obese.  She is in no acute  distress.  She did not appear to be depressed and there is no peripheral  clubbing.  SKIN:  Warm and dry.  HEENT:  Unremarkable.  NECK:  Supple with normal upstrokes bilaterally and no bruits noted.  There  is no bruit noted.  I cannot appreciate thyromegaly.  CHEST:  Clear to auscultation.  CARDIAC:  Irregular rhythm.  There is a 2-3/6 systolic murmur at the apex  that radiates to the left axilla.  ABDOMEN:  Nontender, nondistended, positive bowel sounds, no  hepatosplenomegaly.  No masses appreciated.  There is no abdominal bruit.  She has 2+ femoral pulses bilaterally with no bruits.  EXTREMITIES:   No edema.  I can palpate no cords.  She has 2+ dorsalis pedis  pulses bilaterally.  NEUROLOGIC:  Grossly intact.   LABORATORY DATA AND X-RAY FINDINGS:  Electrocardiogram shows atrial  fibrillation with a controlled ventricular response at 88.  There is  evidence of a prior inferior infarct, but there are no acute ST changes.  Her chest x-ray is pending at the time of this dictation.   Her BNP is elevated at 396.  Her INR is 2.7.  Her initial enzymes are  negative.   ASSESSMENT:  1. Paroxysmal atrial fibrillation (symptomatic).  2. Chest pain.  3. History of coronary artery disease, status post percutaneous coronary     intervention of the right coronary artery.  4. Hypertension.  5. Hyperlipidemia.   PLAN:  1. Judy [name] presents with chest pain that is somewhat worrisome in     description.  She also has a history of significant coronary disease.  We     will admit and rule out myocardial infarction with serial enzymes.  2. We will hold her Coumadin and plan to proceed with cardiac     catheterization when her INR is less than or equal to 1.6.  The risks and     benefits have been discussed and the patient agrees to proceed.  Of note,     an outpatient cardiac catheterization had already been scheduled for late     August.  3. Once her coronary artery disease issue is addressed, then we will begin     Tikosyn and cardiovert after she is full loaded.  We will need to obtain     her Coumadin records from the office to make sure that she has been     therapeutic.  If we are not to perform a TEE, then we will also need to     keep her on intravenous heparin while she is off Coumadin other than at     the time of her cardiac catheterization.  We will continue with Toprol as     well as aspirin.  4. We will check an echocardiogram as she does have a mitral regurgitation     murmur on exam as well as a TSH.  She also has an elevated BNP and is  complaining of dyspnea.  We will  gently diurese and follow her renal     function.                                                Olga Millers, M.D. [facility]    BC/MEDQ  D:  01/16/2004  T:  01/16/2004  Job:  [id number]

## 2010-10-28 NOTE — Assessment & Plan Note (Signed)
[facility]                            CARDIOLOGY OFFICE NOTE   Judy [name], Judy [name]                      MRN:          [id number]  DATE:08/30/2006                            DOB:          Jun 13, 1930    Judy [name] returns today for followup.  She has had previous stenting of the  right coronary artery in December 2006.  She has had false positive  Myoviews in the past.  We tend not to rely on yearly stress tests on  her.  She is asymptomatic and not having any significant angina.  She is  chronic atrial fibrillation with good rate control on anticoagulation.   She has been doing well.  She is looking forward to having her daughters  from the [place] area come to visit for Easter.   REVIEW OF SYSTEMS:  Negative.  She has not had any significant PND,  orthopnea, or chest pain.   She is on:  1. An aspirin a day.  2. Warfarin as directed.  3. Folic acid 400 mg a day.  4. Toprol 100 b.i.d.  5. Lasix 20 a day.  6. Potassium 10 a day.  7. Imdur 15 nightly.  8. Plavix 75 a day.  9. Cardizem 120 at night.  10.Lisinopril 20 a day.   The patient wanted a prescription for samples of Ambien CR.  She had a  coupon with her.   EXAMINATION:  Is remarkable for a blood pressure of 110/70.  Pulse is 67  and irregular.  HEENT:  Normal.  Carotids normal without bruit.  LUNGS:  Clear.  There is an S1 and S2 with normal heart sounds.  ABDOMEN:  Benign.  LOWER EXTREMITIES:  Intact pulses.  No edema.  EKG shows atrial fibrillation with possible previous inferior wall MI.   IMPRESSION:  1. Single vessel coronary disease status post stenting of the right      coronary artery in 2006.  No significant angina.  Continue current      medical therapy.  2. Atrial fibrillation.  Good rate control with calcium blocker and      beta blocker.  Continue to follow up in the Coumadin clinic.   The patient has 1st nitroglycerin at home.  She will continue to take  her Lasix  for lower extremity edema.  Overall, she is doing well, and I  will see her back in 6 months.     Noralyn Pick. Eden Emms, MD, [facility]  Electronically Signed    PCN/MedQ  DD: 08/30/2006  DT: 08/30/2006  Job #: [id number]

## 2010-10-28 NOTE — Op Note (Signed)
Judy [name], Judy [name]               ACCOUNT NO.:  [id number]   MEDICAL RECORD NO.:  [id number]          PATIENT TYPE:  OIB   LOCATION:  NA                           FACILITY:  [facility]   PHYSICIAN:  Olga Millers, M.D. LHCDATE OF BIRTH:  January 06, 1931   DATE OF PROCEDURE:  12/17/2003  DATE OF DISCHARGE:                                 OPERATIVE REPORT   PROCEDURE:  Cardioversion of atrial fibrillation.   The patient was sedated with Pentothal 125 mg intravenously x1.  Synchronized cardioversion with 120 joules (biphasic) resulted in atrial  fibrillation.  Repeat synchronized cardioversion with 150 joules (biphasic)  resulted in normal sinus rhythm.  There were no immediate complications.   She will continue on her Coumadin.      BC/MEDQ  D:  07/14/2004  T:  07/14/2004  Job:  [id number]

## 2010-10-28 NOTE — Op Note (Signed)
NAME:  Judy [name], Judy [name]               ACCOUNT NO.:  [id number]   MEDICAL RECORD NO.:  [id number]          PATIENT TYPE:  INP   LOCATION:                               FACILITY:  [facility]   PHYSICIAN:  Olga Millers, M.D. LHCDATE OF BIRTH:  [birth year]   DATE OF ADMISSION:  12/17/2003  DATE OF DISCHARGE:                                 OPERATIVE REPORT   Dictated 12/17/2003   This is a cardioversion of atrial fibrillation.  The patient was sedated  with Pentothal 125 mg intravenously x1.  Synchronized cardioversion with 128  joules (biphasic) resulted in persistent atrial fibrillation.  Synchronized  cardioversion with 150 joules (biphasic) resulted in normal sinus rhythm.  There were no immediate complications.  The patient will continue on her  Coumadin.        ___________________________________________  Olga Millers, M.D. [facility]    BC/MEDQ  D:  06/27/2004  T:  06/27/2004  Job:  [id number]

## 2010-10-28 NOTE — Discharge Summary (Signed)
Judy [name], Judy [name] NO.:  [id number]   MEDICAL RECORD NO.:  [id number]          PATIENT TYPE:  INP   LOCATION:  6533                         FACILITY:  [facility]   PHYSICIAN:  Salvadore Farber, M.D. LHCDATE OF BIRTH:  [birth year]   DATE OF ADMISSION:  05/17/2005  DATE OF DISCHARGE:  05/18/2005                                 DISCHARGE SUMMARY   PRIMARY CARE PHYSICIANS:  The patient's main cardiologist is Dr. Charlton Haws.  Primary care physician Dr. Luanna Cole. [place].   DIAGNOSES:  Abnormal stress Myoview with inferior wall infarction and mild  peri-infarct ischemia.   PROCEDURES DURING THIS HOSPITALIZATION:  Drug-eluting stent to posterior  descending artery and obtuse marginal.   HISTORY OF PRESENT ILLNESS:  The patient is a 77 year old Caucasian female  who is a patient of Dr. Eden Emms and Dr. Lenord Fellers.  The patient has a previous  history of coronary artery disease with an inferior wall myocardial  infarction that was previously treated in 1996 with stenting.  The patient  also had a catheterization in August 2005 that had shown residual 70%  disease in obtuse marginal and 40% left anterior descending and right  coronary artery disease.  That had been treated medically, however, the  patient had presented to clinic having dyspnea.  The patient had a stress  Myoview performed on February 20, 2005 which showed an abnormal stress  Myoview with inferior wall infarct and mild peri-infarct ischemia.  The  patient as also seen by Dr. Eden Emms on April 28, 2005 with recurrence of  her chest and neck pain and at that time Dr. Eden Emms decided to schedule a  cardiac catheterization.  The patient was admitted on May 17, 2005 for  the cardiac catheterization with result of obtuse marginal receiving a TAXUS  stent 2.5 X 16 and a posterior descending artery receiving a TAXUS stent 2.5  X 12.  The patient was placed on Plavix for six months and aspirin  indefinitely.  The  patient was restarted on Coumadin on May 17, 2005.  On May 18, 2005 the patient was seen by Dr. Samule Ohm and deemed stable to  be discharged to home.  Of note the patient had a small CK-MB elevation peri  procedurally with CK-MB being 12.8, troponin 0.36.  The patient will be  discharged to follow up with Dr. Deniece Ree. on May 31, 2005 at 12:30  for a groin check.  The patient will be discharged on the following  medications.   DISCHARGE MEDICATIONS:  1.  Clopidogrel 75 mg p.o. daily for six months.  2.  Aspirin 325 mg p.o. daily.  3.  Coumadin 2.5 mg p.o. q.h.s.  4.  Simvastatin 80 mg p.o. q.h.s.  5.  Isosorbide mononitrate 50 mg p.o. q.h.s.  6.  Metoprolol succinate 100 mg p.o. b.i.d.  7.  Furosemide 20 mg p.o. daily.  8.  Potassium chloride 10 mEq p.o. daily.  9.  Ramipril 5 mg p.o. daily.  10. Folic acid 1 mg p.o. daily.   DISCHARGE LABORATORY DATA:  Include potassium 4.1, chloride 110, cO2 26  glucose 117, creatinine 1.1 and calcium of 8.3.   DISCHARGE INSTRUCTIONS:  Patient is instructed to follow a heart healthy  diet and to call the office if he notices any problems with his groin site.  Discharge instructions were given to the patient upon discharge.     ______________________________  April Humphrey, NP      Salvadore Farber, M.D. [facility]  Electronically Signed    AH/MEDQ  D:  05/18/2005  T:  05/18/2005  Job:  [id number]   cc:   Charlton Haws, M.D.  [address]  [address]  [place]  [place] [address]   Luanna Cole. Lenord Fellers, M.D.  Fax: [phone]

## 2010-10-28 NOTE — Discharge Summary (Signed)
Judy [name], Judy [name]                         ACCOUNT NO.:  [id number]   MEDICAL RECORD NO.:  [id number]                   PATIENT TYPE:  INP   LOCATION:  2028                                 FACILITY:  [facility]   PHYSICIAN:  Olga Millers, M.D. [facility]            DATE OF BIRTH:  Mar 19, 1931   DATE OF ADMISSION:  01/16/2004  DATE OF DISCHARGE:  01/21/2004                           DISCHARGE SUMMARY - REFERRING   Ms. Kleinsasser is a 77 year old female with a past medical history significant  for atrial fibrillation, coronary artery disease, hypertension,  hypercholesterolemia, who experienced paroxysmal atrial fibrillation.  She  was cardioverted on December 17, 2003, but reverted back to atrial fibrillation  four days later.  She was admitted for Tikosyn load.  Prior to this, she  underwent cardiac catheterization that revealed 40% proximal LAD lesion, 70-  80% OM lesion, 40% mid right coronary artery ulcerated distal lesion, LV  revealed EF 55-60% with slight hypokinesis of the inferior wall.  Dr. Juanda Chance  felt that the circumflex lesion was borderline and the patient would need  outpatient Cardiolite.  Coumadin was then started and plans for a Tikosyn  load was begun.  A quick decision was made not to start Tikosyn secondary to  a QTC initial prolongation of 472.  Amiodarone was used instead of Tikosyn.  On January 20, 2004, a TE was performed to evaluate for thrombus and there  was a small thrombus in the LAA and plans were made to give the patient a  therapeutic level of Coumadin for six weeks and then to cardiovert her.  Amiodarone was not chosen in the treatment plan secondary to the fact that  we did not want her to convert with the known thrombus.  On January 21, 2004,  the patient was discharged to home in stable condition.  Her INR at that  point was 1.4.  She went home with 36 hour coverage of Lovenox while being  loaded with Coumadin.  She was to continue her home medications of Toprol,  Coumadin, Zocor, baby aspirin.  Her new medications included Lasix 20 mg and  potassium 10 mEq daily.  She is to continue the Lovenox injection q.12h. on  Friday and Saturday and then discontinued.  She is to follow up with the  Coumadin clinic on January 25, 2004, at 12:30 p.m.  Follow up with Dr.  Eden Emms on February 26, 2004, at 11 a.m.  She is to call for questions or  concerns.  No straining or lifting over 10 pounds.  Low fat diet.      Guy Franco, P.A. [facility]                      Olga Millers, M.D. [facility]    LB/MEDQ  D:  02/18/2004  T:  02/18/2004  Job:  [id number]   cc:   Luanna Cole. Lenord Fellers, M.D.  [address]  [place]  [place] [address]  Fax: [phone]   Charlton Haws, M.D.   [facility] Salvia, M.D.

## 2010-11-25 ENCOUNTER — Ambulatory Visit (INDEPENDENT_AMBULATORY_CARE_PROVIDER_SITE_OTHER): Payer: Medicare Other | Admitting: *Deleted

## 2010-11-25 DIAGNOSIS — I4891 Unspecified atrial fibrillation: Secondary | ICD-10-CM

## 2010-11-25 LAB — POCT INR: INR: 2.6

## 2010-12-04 ENCOUNTER — Other Ambulatory Visit: Payer: Self-pay | Admitting: Cardiovascular Disease

## 2010-12-14 ENCOUNTER — Other Ambulatory Visit: Payer: Self-pay | Admitting: Cardiovascular Disease

## 2010-12-21 ENCOUNTER — Other Ambulatory Visit: Payer: Self-pay | Admitting: Cardiovascular Disease

## 2010-12-23 ENCOUNTER — Ambulatory Visit (INDEPENDENT_AMBULATORY_CARE_PROVIDER_SITE_OTHER): Payer: Medicare Other | Admitting: *Deleted

## 2010-12-23 DIAGNOSIS — I4891 Unspecified atrial fibrillation: Secondary | ICD-10-CM

## 2010-12-23 LAB — POCT INR: INR: 2.6

## 2011-01-03 ENCOUNTER — Other Ambulatory Visit: Payer: Self-pay | Admitting: Cardiovascular Disease

## 2011-01-10 ENCOUNTER — Other Ambulatory Visit: Payer: Self-pay | Admitting: Cardiovascular Disease

## 2011-01-20 ENCOUNTER — Ambulatory Visit (INDEPENDENT_AMBULATORY_CARE_PROVIDER_SITE_OTHER): Payer: Medicare Other | Admitting: *Deleted

## 2011-01-20 DIAGNOSIS — I4891 Unspecified atrial fibrillation: Secondary | ICD-10-CM

## 2011-01-20 LAB — POCT INR: INR: 1.6

## 2011-01-27 ENCOUNTER — Other Ambulatory Visit: Payer: Self-pay | Admitting: Cardiovascular Disease

## 2011-02-10 ENCOUNTER — Ambulatory Visit (INDEPENDENT_AMBULATORY_CARE_PROVIDER_SITE_OTHER): Payer: Medicare Other | Admitting: *Deleted

## 2011-02-10 DIAGNOSIS — I4891 Unspecified atrial fibrillation: Secondary | ICD-10-CM

## 2011-02-10 LAB — POCT INR: INR: 2.3

## 2011-03-10 ENCOUNTER — Ambulatory Visit (INDEPENDENT_AMBULATORY_CARE_PROVIDER_SITE_OTHER): Payer: Medicare Other | Admitting: *Deleted

## 2011-03-10 DIAGNOSIS — I4891 Unspecified atrial fibrillation: Secondary | ICD-10-CM

## 2011-03-10 LAB — POCT INR: INR: 2.3

## 2011-03-21 ENCOUNTER — Ambulatory Visit (INDEPENDENT_AMBULATORY_CARE_PROVIDER_SITE_OTHER): Payer: Medicare Other | Admitting: Cardiovascular Disease

## 2011-03-21 ENCOUNTER — Encounter: Payer: Self-pay | Admitting: Cardiovascular Disease

## 2011-03-21 VITALS — BP 124/62 | HR 72 | Ht 64.0 in | Wt 204.0 lb

## 2011-03-21 DIAGNOSIS — I447 Left bundle-branch block, unspecified: Secondary | ICD-10-CM

## 2011-03-21 DIAGNOSIS — I4891 Unspecified atrial fibrillation: Secondary | ICD-10-CM

## 2011-03-21 DIAGNOSIS — E782 Mixed hyperlipidemia: Secondary | ICD-10-CM

## 2011-03-21 DIAGNOSIS — I10 Essential (primary) hypertension: Secondary | ICD-10-CM

## 2011-03-21 NOTE — Patient Instructions (Signed)
Your physician wants you to follow-up in: 6 MONTHS You will receive a reminder letter in the mail two months in advance. If you don't receive a letter, please call our office to schedule the follow-up appointment.   DR Loraine Leriche [phone]

## 2011-03-21 NOTE — Assessment & Plan Note (Signed)
Good rate control and anticoagulation  

## 2011-03-21 NOTE — Assessment & Plan Note (Signed)
Cholesterol is at goal.  Continue current dose of statin and diet Rx.  No myalgias or side effects.  F/U  LFT's in 6 months. Lab Results  Component Value Date   LDLCALC 61 09/17/2009             

## 2011-03-21 NOTE — Progress Notes (Signed)
Judy [name]  is seen today in followup for atypical chest pain chronic left bundle branch block and chronic atrial fibrillation. She has failed cardioversions and is now strictly anticoagulation rate control. She has not had any significant palpitations PND or orthopnea. She's been having some diaphoresis and sweats. I suspect is more hormonal and not related to her heart. He's had some atypical chest pain. It is sharp on the right and left side of her chest is fleeting it can occur anytime it is nonexertional. She had a normal heart catheterization in 2004 in her last normal Myoview was in 2008. Check chronic left bundle branch block but no evidence of high-grade heart block or syncope. Her INRs have been therapeutic without bleeding diathesis and she's not had any significant TIAs  Echo 03/18/10 moderate biatrial enlargement moderate MR and normal EF  ROS: Denies fever, malais, weight loss, blurry vision, decreased visual acuity, cough, sputum, SOB, hemoptysis, pleuritic pain, palpitaitons, heartburn, abdominal pain, melena, lower extremity edema, claudication, or rash.  All other systems reviewed and negative  General: Affect appropriate Healthy:  appears stated age HEENT: normal Neck supple with no adenopathy JVP normal no bruits no thyromegaly Lungs clear with no wheezing and good diaphragmatic motion Heart:  S1/S2 no murmur,rub, gallop or click PMI normal Abdomen: benighn, BS positve, no tenderness, no AAA no bruit.  No HSM or HJR Distal pulses intact with no bruits No edema Neuro non-focal Skin warm and dry No muscular weakness   Current Outpatient Prescriptions  Medication Sig Dispense Refill  . aspirin 81 MG tablet Take 81 mg by mouth daily.        . clopidogrel (PLAVIX) 75 MG tablet Take 75 mg by mouth daily.        Marland Kitchen diltiazem (CARDIZEM CD) 120 MG 24 hr capsule TAKE ONE CAPSULE BY MOUTH ONE TIME DAILY  30 capsule  11  . furosemide (LASIX) 20 MG tablet TAKE ONE TABLET BY MOUTH ONE  TIME DAILY  90 tablet  1  . isosorbide mononitrate (IMDUR) 30 MG 24 hr tablet TAKE ONE TABLET BY MOUTH ONE TIME DAILY  30 tablet  10  . lisinopril (PRINIVIL,ZESTRIL) 10 MG tablet Take 10 mg by mouth daily.        . Loratadine (CLARITIN) 10 MG CAPS 1 tab po qd       . metoprolol (TOPROL-XL) 100 MG 24 hr tablet TAKE ONE TABLET BY MOUTH TWICE DAILY  60 tablet  10  . Multiple Vitamins-Minerals (CENTRUM PO) 1 tab po qd       . Multiple Vitamins-Minerals (OSTEO COMPLEX PO) 1 tab po qd       . potassium chloride (KLOR-CON) 10 MEQ CR tablet Take 10 mEq by mouth daily.        . pravastatin (PRAVACHOL) 80 MG tablet TAKE ONE TABLET BY MOUTH AT BEDTIME  30 tablet  11  . warfarin (COUMADIN) 5 MG tablet Take as directed by Anticoagulation clinic   90 tablet  1    Allergies  Review of patient's allergies indicates no known allergies.  Electrocardiogram:  Afib 72 nonspecific ST/T wave changes  Assessment and Plan

## 2011-03-21 NOTE — Assessment & Plan Note (Signed)
ECG next visit  No syncope or evidence of high grade heart block

## 2011-03-21 NOTE — Assessment & Plan Note (Signed)
Well controlled.  Continue current medications and low sodium Dash type diet.    

## 2011-03-30 ENCOUNTER — Encounter: Payer: Self-pay | Admitting: Internal Medicine

## 2011-04-07 ENCOUNTER — Ambulatory Visit (INDEPENDENT_AMBULATORY_CARE_PROVIDER_SITE_OTHER): Payer: Medicare Other | Admitting: *Deleted

## 2011-04-07 DIAGNOSIS — Z7901 Long term (current) use of anticoagulants: Secondary | ICD-10-CM

## 2011-04-07 DIAGNOSIS — I4891 Unspecified atrial fibrillation: Secondary | ICD-10-CM

## 2011-04-07 LAB — POCT INR: INR: 1.8

## 2011-05-05 ENCOUNTER — Other Ambulatory Visit: Payer: Self-pay | Admitting: Cardiovascular Disease

## 2011-05-08 ENCOUNTER — Ambulatory Visit (INDEPENDENT_AMBULATORY_CARE_PROVIDER_SITE_OTHER): Payer: Medicare Other | Admitting: *Deleted

## 2011-05-08 DIAGNOSIS — Z7901 Long term (current) use of anticoagulants: Secondary | ICD-10-CM

## 2011-05-08 DIAGNOSIS — I4891 Unspecified atrial fibrillation: Secondary | ICD-10-CM

## 2011-05-08 LAB — POCT INR: INR: 2.4

## 2011-06-09 ENCOUNTER — Ambulatory Visit (INDEPENDENT_AMBULATORY_CARE_PROVIDER_SITE_OTHER): Payer: Medicare Other | Admitting: *Deleted

## 2011-06-09 DIAGNOSIS — Z7901 Long term (current) use of anticoagulants: Secondary | ICD-10-CM

## 2011-06-09 DIAGNOSIS — I4891 Unspecified atrial fibrillation: Secondary | ICD-10-CM

## 2011-06-09 LAB — POCT INR: INR: 2.2

## 2011-07-03 ENCOUNTER — Other Ambulatory Visit: Payer: Self-pay | Admitting: Cardiovascular Disease

## 2011-07-07 ENCOUNTER — Ambulatory Visit (INDEPENDENT_AMBULATORY_CARE_PROVIDER_SITE_OTHER): Payer: Medicare Other | Admitting: *Deleted

## 2011-07-07 DIAGNOSIS — Z7901 Long term (current) use of anticoagulants: Secondary | ICD-10-CM

## 2011-07-07 DIAGNOSIS — I4891 Unspecified atrial fibrillation: Secondary | ICD-10-CM

## 2011-07-07 LAB — POCT INR: INR: 2

## 2011-07-14 ENCOUNTER — Other Ambulatory Visit: Payer: Self-pay | Admitting: Cardiovascular Disease

## 2011-08-10 ENCOUNTER — Other Ambulatory Visit: Payer: Self-pay | Admitting: Cardiovascular Disease

## 2011-08-11 ENCOUNTER — Other Ambulatory Visit: Payer: Self-pay

## 2011-08-11 MED ORDER — LISINOPRIL 10 MG PO TABS: 10.0000 mg | ORAL_TABLET | Freq: Every day | ORAL | Status: DC

## 2011-08-16 ENCOUNTER — Other Ambulatory Visit: Payer: Self-pay | Admitting: Cardiovascular Disease

## 2011-08-18 ENCOUNTER — Ambulatory Visit (INDEPENDENT_AMBULATORY_CARE_PROVIDER_SITE_OTHER): Payer: Medicare Other | Admitting: *Deleted

## 2011-08-18 DIAGNOSIS — I4891 Unspecified atrial fibrillation: Secondary | ICD-10-CM

## 2011-08-18 DIAGNOSIS — Z7901 Long term (current) use of anticoagulants: Secondary | ICD-10-CM

## 2011-09-29 DIAGNOSIS — H26499 Other secondary cataract, unspecified eye: Secondary | ICD-10-CM | POA: Diagnosis not present

## 2011-10-02 ENCOUNTER — Other Ambulatory Visit: Payer: Self-pay | Admitting: Cardiovascular Disease

## 2011-10-03 ENCOUNTER — Ambulatory Visit (INDEPENDENT_AMBULATORY_CARE_PROVIDER_SITE_OTHER): Payer: Medicare Other | Admitting: *Deleted

## 2011-10-03 ENCOUNTER — Encounter: Payer: Self-pay | Admitting: Cardiovascular Disease

## 2011-10-03 ENCOUNTER — Ambulatory Visit (INDEPENDENT_AMBULATORY_CARE_PROVIDER_SITE_OTHER): Payer: Medicare Other | Admitting: Cardiovascular Disease

## 2011-10-03 VITALS — BP 144/81 | HR 84 | Ht 64.0 in | Wt 206.0 lb

## 2011-10-03 DIAGNOSIS — Z7901 Long term (current) use of anticoagulants: Secondary | ICD-10-CM | POA: Diagnosis not present

## 2011-10-03 DIAGNOSIS — I4891 Unspecified atrial fibrillation: Secondary | ICD-10-CM

## 2011-10-03 DIAGNOSIS — I447 Left bundle-branch block, unspecified: Secondary | ICD-10-CM | POA: Diagnosis not present

## 2011-10-03 DIAGNOSIS — I779 Disorder of arteries and arterioles, unspecified: Secondary | ICD-10-CM | POA: Diagnosis not present

## 2011-10-03 DIAGNOSIS — E785 Hyperlipidemia, unspecified: Secondary | ICD-10-CM

## 2011-10-03 LAB — POCT INR: INR: 2.3

## 2011-10-03 NOTE — Assessment & Plan Note (Signed)
Good rate control and anticoagulation.  F/U coumadin clinic  [facility] INR;s Rx

## 2011-10-03 NOTE — Assessment & Plan Note (Signed)
Well controlled.  Continue current medications and low sodium Dash type diet.    

## 2011-10-03 NOTE — Assessment & Plan Note (Signed)
No syncope or signs of AV block  F/U ECG next visit

## 2011-10-03 NOTE — Assessment & Plan Note (Signed)
Check lipid and liver with [place] visit

## 2011-10-03 NOTE — Patient Instructions (Signed)
Your physician has requested that you have a carotid duplex in May 2013. This test is an ultrasound of the carotid arteries in your neck. It looks at blood flow through these arteries that supply the brain with blood. Allow one hour for this exam. There are no restrictions or special instructions.  Your physician recommends that you return for fasting lab work in: May 2013.  Lipid Panel and Liver Function.

## 2011-10-03 NOTE — Assessment & Plan Note (Signed)
No TIA symptoms  F/U duplex next month  4/11 0-39% bilateral disease

## 2011-10-03 NOTE — Progress Notes (Signed)
Patient ID: Judy [name], female   DOB: [birth year], 78 y.o.   MRN: [id number] Judy [name] is seen today in followup for atypical chest pain chronic left bundle branch block and chronic atrial fibrillation. She has failed cardioversions and is now strictly anticoagulation rate control. She has not had any significant palpitations PND or orthopnea. No complaints  Allergies have been fine.  She had a normal heart catheterization in 2004 in her last normal Myoview was in 2008. Check chronic left bundle branch block but no evidence of high-grade heart block or syncope. Her INRs have been therapeutic without bleeding diathesis and she's not had any significant TIAs  4/11 had carotids  0-39% bilateral disease  F/U next month  Echo 03/18/10 moderate biatrial enlargement moderate MR and normal EF  ROS: Denies fever, malais, weight loss, blurry vision, decreased visual acuity, cough, sputum, SOB, hemoptysis, pleuritic pain, palpitaitons, heartburn, abdominal pain, melena, lower extremity edema, claudication, or rash.  All other systems reviewed and negative  General: Affect appropriate Healthy:  appears stated age HEENT: normal Neck supple with no adenopathy JVP normal no bruits no thyromegaly Lungs clear with no wheezing and good diaphragmatic motion Heart:  S1/S2 no murmur, no rub, gallop or click PMI normal Abdomen: benighn, BS positve, no tenderness, no AAA no bruit.  No HSM or HJR Distal pulses intact with no bruits No edema Neuro non-focal Skin warm and dry No muscular weakness   Current Outpatient Prescriptions  Medication Sig Dispense Refill  . aspirin 81 MG tablet Take 81 mg by mouth daily.        . clopidogrel (PLAVIX) 75 MG tablet TAKE ONE TABLET BY MOUTH ONE TIME DAILY  30 tablet  10  . diltiazem (CARDIZEM CD) 120 MG 24 hr capsule TAKE ONE CAPSULE BY MOUTH ONE TIME DAILY  30 capsule  11  . furosemide (LASIX) 20 MG tablet TAKE ONE TABLET BY MOUTH ONE TIME DAILY  90 tablet  3  . isosorbide  mononitrate (IMDUR) 30 MG 24 hr tablet TAKE ONE TABLET BY MOUTH ONE TIME DAILY  30 tablet  9  . lisinopril (PRINIVIL,ZESTRIL) 10 MG tablet Take 1 tablet (10 mg total) by mouth daily.  90 tablet  1  . Loratadine (CLARITIN) 10 MG CAPS 1 tab po qd       . metoprolol (TOPROL-XL) 100 MG 24 hr tablet TAKE ONE TABLET BY MOUTH TWICE DAILY  60 tablet  10  . Multiple Vitamins-Minerals (CENTRUM PO) 1 tab po qd       . Multiple Vitamins-Minerals (OSTEO COMPLEX PO) 1 tab po qd       . potassium chloride (KLOR-CON) 10 MEQ CR tablet Take 10 mEq by mouth daily.        . pravastatin (PRAVACHOL) 80 MG tablet TAKE ONE TABLET BY MOUTH AT BEDTIME  30 tablet  11  . warfarin (COUMADIN) 5 MG tablet Take as directed by Anticoagulation clinic   90 tablet  1  . DISCONTD: potassium chloride (MICRO-K) 10 MEQ CR capsule TAKE ONE CAPSULE BY MOUTH ONE TIME DAILY  90 capsule  1    Allergies  Review of patient's allergies indicates no known allergies.  Electrocardiogram:  03/21/11  AFib LBBB stable  Assessment and Plan

## 2011-10-24 ENCOUNTER — Other Ambulatory Visit: Payer: Medicare Other

## 2011-10-26 ENCOUNTER — Other Ambulatory Visit: Payer: Self-pay | Admitting: Cardiovascular Disease

## 2011-11-01 DIAGNOSIS — IMO0002 Reserved for concepts with insufficient information to code with codable children: Secondary | ICD-10-CM | POA: Diagnosis not present

## 2011-11-14 ENCOUNTER — Ambulatory Visit (INDEPENDENT_AMBULATORY_CARE_PROVIDER_SITE_OTHER): Payer: Medicare Other | Admitting: *Deleted

## 2011-11-14 DIAGNOSIS — I4891 Unspecified atrial fibrillation: Secondary | ICD-10-CM

## 2011-11-14 DIAGNOSIS — Z7901 Long term (current) use of anticoagulants: Secondary | ICD-10-CM

## 2011-11-14 LAB — POCT INR: INR: 2.2

## 2011-11-29 DIAGNOSIS — M25569 Pain in unspecified knee: Secondary | ICD-10-CM | POA: Diagnosis not present

## 2011-11-30 DIAGNOSIS — M7989 Other specified soft tissue disorders: Secondary | ICD-10-CM | POA: Diagnosis not present

## 2011-12-05 ENCOUNTER — Other Ambulatory Visit (INDEPENDENT_AMBULATORY_CARE_PROVIDER_SITE_OTHER): Payer: Medicare Other

## 2011-12-05 ENCOUNTER — Other Ambulatory Visit: Payer: Self-pay | Admitting: *Deleted

## 2011-12-05 ENCOUNTER — Encounter (INDEPENDENT_AMBULATORY_CARE_PROVIDER_SITE_OTHER): Payer: Medicare Other

## 2011-12-05 DIAGNOSIS — I6529 Occlusion and stenosis of unspecified carotid artery: Secondary | ICD-10-CM

## 2011-12-05 DIAGNOSIS — E785 Hyperlipidemia, unspecified: Secondary | ICD-10-CM

## 2011-12-05 LAB — LIPID PANEL
HDL: 50.5 mg/dL (ref >39.00)
Triglycerides: 271 mg/dL — ABNORMAL HIGH (ref 0.0–149.0)

## 2011-12-05 LAB — LDL CHOLESTEROL, DIRECT: Direct LDL: 93.1 mg/dL

## 2011-12-05 LAB — HEPATIC FUNCTION PANEL
ALT: 16 U/L (ref 0–35)
Albumin: 3.9 g/dL (ref 3.5–5.2)
Total Protein: 7.2 g/dL (ref 6.0–8.3)

## 2011-12-14 ENCOUNTER — Other Ambulatory Visit: Payer: Self-pay | Admitting: Cardiovascular Disease

## 2011-12-16 ENCOUNTER — Other Ambulatory Visit: Payer: Self-pay | Admitting: Cardiovascular Disease

## 2011-12-26 ENCOUNTER — Ambulatory Visit (INDEPENDENT_AMBULATORY_CARE_PROVIDER_SITE_OTHER): Payer: Medicare Other | Admitting: *Deleted

## 2011-12-26 DIAGNOSIS — I4891 Unspecified atrial fibrillation: Secondary | ICD-10-CM

## 2011-12-26 DIAGNOSIS — Z7901 Long term (current) use of anticoagulants: Secondary | ICD-10-CM

## 2011-12-26 LAB — POCT INR: INR: 2.2

## 2012-01-03 DIAGNOSIS — M25569 Pain in unspecified knee: Secondary | ICD-10-CM | POA: Diagnosis not present

## 2012-01-22 ENCOUNTER — Other Ambulatory Visit: Payer: Self-pay | Admitting: Cardiovascular Disease

## 2012-02-03 ENCOUNTER — Other Ambulatory Visit: Payer: Self-pay | Admitting: Cardiovascular Disease

## 2012-02-06 ENCOUNTER — Ambulatory Visit (INDEPENDENT_AMBULATORY_CARE_PROVIDER_SITE_OTHER): Payer: Medicare Other | Admitting: Pharmacist

## 2012-02-06 DIAGNOSIS — Z7901 Long term (current) use of anticoagulants: Secondary | ICD-10-CM

## 2012-02-06 DIAGNOSIS — I4891 Unspecified atrial fibrillation: Secondary | ICD-10-CM

## 2012-03-05 ENCOUNTER — Ambulatory Visit (INDEPENDENT_AMBULATORY_CARE_PROVIDER_SITE_OTHER): Payer: Medicare Other | Admitting: *Deleted

## 2012-03-05 DIAGNOSIS — I4891 Unspecified atrial fibrillation: Secondary | ICD-10-CM | POA: Diagnosis not present

## 2012-03-05 DIAGNOSIS — Z7901 Long term (current) use of anticoagulants: Secondary | ICD-10-CM

## 2012-03-22 DIAGNOSIS — Z23 Encounter for immunization: Secondary | ICD-10-CM | POA: Diagnosis not present

## 2012-03-30 ENCOUNTER — Other Ambulatory Visit: Payer: Self-pay | Admitting: Cardiovascular Disease

## 2012-04-04 DIAGNOSIS — H26499 Other secondary cataract, unspecified eye: Secondary | ICD-10-CM | POA: Diagnosis not present

## 2012-04-16 ENCOUNTER — Ambulatory Visit (INDEPENDENT_AMBULATORY_CARE_PROVIDER_SITE_OTHER): Payer: Medicare Other | Admitting: *Deleted

## 2012-04-16 DIAGNOSIS — I4891 Unspecified atrial fibrillation: Secondary | ICD-10-CM | POA: Diagnosis not present

## 2012-04-16 DIAGNOSIS — Z7901 Long term (current) use of anticoagulants: Secondary | ICD-10-CM | POA: Diagnosis not present

## 2012-04-16 LAB — POCT INR: INR: 2.3

## 2012-05-08 ENCOUNTER — Other Ambulatory Visit: Payer: Self-pay | Admitting: *Deleted

## 2012-05-08 MED ORDER — LISINOPRIL 10 MG PO TABS: 10.0000 mg | ORAL_TABLET | Freq: Every day | ORAL | Status: DC

## 2012-05-14 ENCOUNTER — Other Ambulatory Visit: Payer: Self-pay | Admitting: *Deleted

## 2012-05-14 MED ORDER — ISOSORBIDE MONONITRATE ER 30 MG PO TB24: ORAL_TABLET | ORAL | Status: DC

## 2012-05-27 ENCOUNTER — Ambulatory Visit (INDEPENDENT_AMBULATORY_CARE_PROVIDER_SITE_OTHER): Payer: Medicare Other | Admitting: General Practice

## 2012-05-27 ENCOUNTER — Ambulatory Visit (INDEPENDENT_AMBULATORY_CARE_PROVIDER_SITE_OTHER): Payer: Medicare Other | Admitting: Internal Medicine

## 2012-05-27 ENCOUNTER — Encounter: Payer: Self-pay | Admitting: Internal Medicine

## 2012-05-27 VITALS — BP 120/70 | HR 70 | Temp 97.9°F | Ht 64.5 in | Wt 205.0 lb

## 2012-05-27 DIAGNOSIS — I4891 Unspecified atrial fibrillation: Secondary | ICD-10-CM

## 2012-05-27 DIAGNOSIS — I2510 Atherosclerotic heart disease of native coronary artery without angina pectoris: Secondary | ICD-10-CM

## 2012-05-27 DIAGNOSIS — Z7901 Long term (current) use of anticoagulants: Secondary | ICD-10-CM | POA: Diagnosis not present

## 2012-05-27 DIAGNOSIS — E782 Mixed hyperlipidemia: Secondary | ICD-10-CM

## 2012-05-27 DIAGNOSIS — I10 Essential (primary) hypertension: Secondary | ICD-10-CM

## 2012-05-27 DIAGNOSIS — Z1239 Encounter for other screening for malignant neoplasm of breast: Secondary | ICD-10-CM | POA: Diagnosis not present

## 2012-05-27 DIAGNOSIS — Z1231 Encounter for screening mammogram for malignant neoplasm of breast: Secondary | ICD-10-CM

## 2012-05-27 LAB — POCT INR: INR: 2.2

## 2012-05-27 NOTE — Progress Notes (Signed)
Subjective:    Patient ID: Judy [name], female    DOB: 1930-07-04, 78 y.o.   MRN: [id number]  HPI  new patient to me and our division, here to establish with PCP - known to [facility] Reviewed chronic medical issues:  hypertension - the patient reports compliance with medication(s) as prescribed. Denies adverse side effects.  Dyslipidemia - on statin - the patient reports compliance with medication(s) as prescribed. Denies adverse side effects.  CAF - failed prior DCCV attempt - rate control and anticoag - follow with cards q 6-100mo for same - no TIA symptoms, palpitations or chest pain  CAD - DES 1996 following MI, then 2 others 2006, on DAP (plavix + asa 81) in addtion to warfarin for AF - the patient reports compliance with medication(s) as prescribed. Denies adverse side effects.  Past Medical History  Diagnosis Date  . CAD (coronary artery disease)     Stent RCA 96' Stent PDA and OM DES 2006  . Atrial fibrillation     chronic anticoag, failed prior DCCV  . HTN (hypertension)   . Hyperlipidemia   . Obesity   . CAROTID ARTERY DISEASE   . LBBB (left bundle branch block)    Family History  Problem Relation Age of Onset  . Coronary artery disease    . Heart disease    . Hyperlipidemia    . Arthritis Mother   . Arthritis Father    History  Substance Use Topics  . Smoking status: Never Smoker   . Smokeless tobacco: Not on file  . Alcohol Use: No   Review of Systems Constitutional: Negative for fever or weight change.  Respiratory: Negative for cough and shortness of breath.   Cardiovascular: Negative for chest pain or palpitations.  Gastrointestinal: Negative for abdominal pain, no bowel changes.  Musculoskeletal: Negative for gait problem or joint swelling.  Skin: Negative for rash.  Neurological: Negative for dizziness or headache.  No other specific complaints in a complete review of systems (except as listed in HPI above).     Objective:   Physical  Exam BP 120/70  Pulse 70  Temp 97.9 F (36.6 C) (Oral)  Ht 5' 4.5" (1.638 m)  Wt 205 lb (92.987 kg)  BMI 34.64 kg/m2  SpO2 96% Wt Readings from Last 3 Encounters:  05/27/12 205 lb (92.987 kg)  10/03/11 206 lb (93.441 kg)  03/21/11 204 lb (92.534 kg)   Constitutional: She is overweight, but appears well-developed and well-nourished. No distress.  Eyes: Conjunctivae and EOM are normal. Pupils are equal, round, and reactive to light. No scleral icterus.  Neck: Normal range of motion. Neck supple. No JVD present. No thyromegaly present.  Cardiovascular: Normal rate, regular rhythm and normal heart sounds.  No murmur heard. No BLE edema. Pulmonary/Chest: Effort normal and breath sounds normal. No respiratory distress. She has no wheezes.  Neurological: She is alert and oriented to person, place, and time. No cranial nerve deficit. Coordination normal.  Psychiatric: She has a normal mood and affect. Her behavior is normal. Judgment and thought content normal.   Lab Results  Component Value Date   WBC 5.7 09/17/2009   HGB 13.9 09/17/2009   HCT 40.3 09/17/2009   PLT 200.0 09/17/2009   GLUCOSE 118* 09/17/2009   CHOL 170 12/05/2011   TRIG 271.0* 12/05/2011   HDL 50.50 12/05/2011   LDLDIRECT 93.1 12/05/2011   LDLCALC 61 09/17/2009   ALT 16 12/05/2011   AST 20 12/05/2011   NA 141 09/17/2009  K 4.5 09/17/2009   CL 105 09/17/2009   CREATININE 1.1 09/17/2009   BUN 17 09/17/2009   CO2 28 09/17/2009   TSH 1.88 09/17/2009   INR 2.2 05/27/2012        Assessment & Plan:   See problem list. Medications and labs reviewed today.  Time spent with pt today 35 minutes, greater than 50% time spent counseling patient on AF, hypertension, lipids, CAD and medication review. Also review of prior records and ROI request

## 2012-05-27 NOTE — Patient Instructions (Signed)
It was good to see you today. We have reviewed your prior records including labs and tests today we will send to your prior provider(s) for "release of records" as discussed today to review your immunizations Medications reviewed, no changes at this time. we'll make referral for mammogram screening . Our office will contact you regarding appointment(s) once made. Please schedule followup in 1 year, call sooner if problems.

## 2012-05-27 NOTE — Assessment & Plan Note (Signed)
On statin - last lipids reviewed The current medical regimen is effective;  continue present plan and medications.  

## 2012-05-27 NOTE — Assessment & Plan Note (Signed)
BP Readings from Last 3 Encounters:  05/27/12 120/70  10/03/11 144/81  03/21/11 124/62   The current medical regimen is effective;  continue present plan and medications.

## 2012-05-27 NOTE — Assessment & Plan Note (Signed)
Rate control and chronic anticoag with [facility]) - failed DCCV attempts in past Also follows with cards annually asymptomatic - The current medical regimen is effective;  continue present plan and medications.

## 2012-05-27 NOTE — Assessment & Plan Note (Signed)
MI with stent in '96, then 2 stents more with cath 2006 No anginal symptoms The current medical regimen is effective;  continue present plan and medications.

## 2012-05-30 ENCOUNTER — Other Ambulatory Visit: Payer: Self-pay | Admitting: Cardiovascular Disease

## 2012-05-30 MED ORDER — CLOPIDOGREL BISULFATE 75 MG PO TABS: 75.0000 mg | ORAL_TABLET | Freq: Every day | ORAL | Status: DC

## 2012-07-09 ENCOUNTER — Other Ambulatory Visit: Payer: Self-pay | Admitting: *Deleted

## 2012-07-09 ENCOUNTER — Ambulatory Visit (INDEPENDENT_AMBULATORY_CARE_PROVIDER_SITE_OTHER): Payer: Medicare Other | Admitting: General Practice

## 2012-07-09 DIAGNOSIS — I4891 Unspecified atrial fibrillation: Secondary | ICD-10-CM

## 2012-07-09 DIAGNOSIS — Z7901 Long term (current) use of anticoagulants: Secondary | ICD-10-CM | POA: Diagnosis not present

## 2012-07-09 LAB — POCT INR: INR: 2.3

## 2012-07-09 MED ORDER — FUROSEMIDE 20 MG PO TABS: ORAL_TABLET | ORAL | Status: DC

## 2012-07-09 MED ORDER — POTASSIUM CHLORIDE ER 10 MEQ PO CPCR: ORAL_CAPSULE | ORAL | Status: DC

## 2012-08-19 ENCOUNTER — Other Ambulatory Visit: Payer: Self-pay

## 2012-08-19 MED ORDER — WARFARIN SODIUM 5 MG PO TABS: ORAL_TABLET | ORAL | Status: DC

## 2012-08-20 ENCOUNTER — Other Ambulatory Visit: Payer: Self-pay | Admitting: *Deleted

## 2012-08-20 ENCOUNTER — Ambulatory Visit (INDEPENDENT_AMBULATORY_CARE_PROVIDER_SITE_OTHER): Payer: Medicare Other | Admitting: General Practice

## 2012-08-20 DIAGNOSIS — Z7901 Long term (current) use of anticoagulants: Secondary | ICD-10-CM

## 2012-08-20 DIAGNOSIS — I4891 Unspecified atrial fibrillation: Secondary | ICD-10-CM

## 2012-08-20 LAB — POCT INR: INR: 1.7

## 2012-08-20 MED ORDER — METOPROLOL SUCCINATE ER 100 MG PO TB24: ORAL_TABLET | ORAL | Status: DC

## 2012-09-11 DIAGNOSIS — H35379 Puckering of macula, unspecified eye: Secondary | ICD-10-CM | POA: Diagnosis not present

## 2012-09-11 DIAGNOSIS — H353290 Exudative age-related macular degeneration, unspecified eye, stage unspecified: Secondary | ICD-10-CM | POA: Diagnosis not present

## 2012-09-11 DIAGNOSIS — H35059 Retinal neovascularization, unspecified, unspecified eye: Secondary | ICD-10-CM | POA: Diagnosis not present

## 2012-09-11 DIAGNOSIS — H26499 Other secondary cataract, unspecified eye: Secondary | ICD-10-CM | POA: Diagnosis not present

## 2012-09-11 DIAGNOSIS — H43819 Vitreous degeneration, unspecified eye: Secondary | ICD-10-CM | POA: Diagnosis not present

## 2012-09-17 ENCOUNTER — Ambulatory Visit (INDEPENDENT_AMBULATORY_CARE_PROVIDER_SITE_OTHER): Payer: Medicare Other | Admitting: General Practice

## 2012-09-17 DIAGNOSIS — I4891 Unspecified atrial fibrillation: Secondary | ICD-10-CM | POA: Diagnosis not present

## 2012-09-17 DIAGNOSIS — Z7901 Long term (current) use of anticoagulants: Secondary | ICD-10-CM | POA: Diagnosis not present

## 2012-09-18 DIAGNOSIS — H353290 Exudative age-related macular degeneration, unspecified eye, stage unspecified: Secondary | ICD-10-CM | POA: Diagnosis not present

## 2012-10-07 DIAGNOSIS — L723 Sebaceous cyst: Secondary | ICD-10-CM | POA: Diagnosis not present

## 2012-10-07 DIAGNOSIS — D235 Other benign neoplasm of skin of trunk: Secondary | ICD-10-CM | POA: Diagnosis not present

## 2012-10-07 DIAGNOSIS — D2360 Other benign neoplasm of skin of unspecified upper limb, including shoulder: Secondary | ICD-10-CM | POA: Diagnosis not present

## 2012-10-08 ENCOUNTER — Ambulatory Visit (INDEPENDENT_AMBULATORY_CARE_PROVIDER_SITE_OTHER): Payer: Medicare Other | Admitting: General Practice

## 2012-10-08 DIAGNOSIS — Z7901 Long term (current) use of anticoagulants: Secondary | ICD-10-CM

## 2012-10-08 DIAGNOSIS — I4891 Unspecified atrial fibrillation: Secondary | ICD-10-CM

## 2012-10-08 LAB — POCT INR: INR: 2.4

## 2012-10-16 DIAGNOSIS — H43819 Vitreous degeneration, unspecified eye: Secondary | ICD-10-CM | POA: Diagnosis not present

## 2012-10-16 DIAGNOSIS — H353290 Exudative age-related macular degeneration, unspecified eye, stage unspecified: Secondary | ICD-10-CM | POA: Diagnosis not present

## 2012-10-16 DIAGNOSIS — H35059 Retinal neovascularization, unspecified, unspecified eye: Secondary | ICD-10-CM | POA: Diagnosis not present

## 2012-10-16 DIAGNOSIS — H35379 Puckering of macula, unspecified eye: Secondary | ICD-10-CM | POA: Diagnosis not present

## 2012-11-05 ENCOUNTER — Ambulatory Visit (INDEPENDENT_AMBULATORY_CARE_PROVIDER_SITE_OTHER): Payer: Medicare Other | Admitting: Family Medicine

## 2012-11-05 DIAGNOSIS — I4891 Unspecified atrial fibrillation: Secondary | ICD-10-CM

## 2012-11-05 DIAGNOSIS — Z7901 Long term (current) use of anticoagulants: Secondary | ICD-10-CM

## 2012-11-05 LAB — POCT INR: INR: 3.1

## 2012-11-13 DIAGNOSIS — H35059 Retinal neovascularization, unspecified, unspecified eye: Secondary | ICD-10-CM | POA: Diagnosis not present

## 2012-11-13 DIAGNOSIS — H43819 Vitreous degeneration, unspecified eye: Secondary | ICD-10-CM | POA: Diagnosis not present

## 2012-11-13 DIAGNOSIS — H35379 Puckering of macula, unspecified eye: Secondary | ICD-10-CM | POA: Diagnosis not present

## 2012-11-13 DIAGNOSIS — H353290 Exudative age-related macular degeneration, unspecified eye, stage unspecified: Secondary | ICD-10-CM | POA: Diagnosis not present

## 2012-11-20 ENCOUNTER — Telehealth: Payer: Self-pay

## 2012-11-20 ENCOUNTER — Other Ambulatory Visit: Payer: Self-pay

## 2012-11-20 NOTE — Telephone Encounter (Signed)
Pharm called about refill on Pravastatin. I told him that pt needs to come in for an OV because it's been over a year. Gave pt one with one refill.

## 2012-11-20 NOTE — Telephone Encounter (Signed)
Gave pt one with one refill and advised pharmacy that the pt needs to make an office visit

## 2012-11-26 ENCOUNTER — Ambulatory Visit (INDEPENDENT_AMBULATORY_CARE_PROVIDER_SITE_OTHER): Payer: Medicare Other | Admitting: General Practice

## 2012-11-26 DIAGNOSIS — Z7901 Long term (current) use of anticoagulants: Secondary | ICD-10-CM

## 2012-11-26 DIAGNOSIS — I4891 Unspecified atrial fibrillation: Secondary | ICD-10-CM

## 2012-11-26 LAB — POCT INR: INR: 3.2

## 2012-11-27 ENCOUNTER — Other Ambulatory Visit: Payer: Self-pay | Admitting: *Deleted

## 2012-11-27 MED ORDER — CLOPIDOGREL BISULFATE 75 MG PO TABS: 75.0000 mg | ORAL_TABLET | Freq: Every day | ORAL | Status: DC

## 2012-12-10 ENCOUNTER — Ambulatory Visit (INDEPENDENT_AMBULATORY_CARE_PROVIDER_SITE_OTHER): Payer: Medicare Other | Admitting: General Practice

## 2012-12-10 ENCOUNTER — Other Ambulatory Visit: Payer: Self-pay | Admitting: General Practice

## 2012-12-10 DIAGNOSIS — Z7901 Long term (current) use of anticoagulants: Secondary | ICD-10-CM | POA: Diagnosis not present

## 2012-12-10 DIAGNOSIS — I4891 Unspecified atrial fibrillation: Secondary | ICD-10-CM | POA: Diagnosis not present

## 2012-12-10 LAB — POCT INR: INR: 3.2

## 2012-12-10 MED ORDER — WARFARIN SODIUM 2.5 MG PO TABS: ORAL_TABLET | ORAL | Status: DC

## 2013-01-01 DIAGNOSIS — H35379 Puckering of macula, unspecified eye: Secondary | ICD-10-CM | POA: Diagnosis not present

## 2013-01-01 DIAGNOSIS — H35059 Retinal neovascularization, unspecified, unspecified eye: Secondary | ICD-10-CM | POA: Diagnosis not present

## 2013-01-01 DIAGNOSIS — H353290 Exudative age-related macular degeneration, unspecified eye, stage unspecified: Secondary | ICD-10-CM | POA: Diagnosis not present

## 2013-01-01 DIAGNOSIS — H43819 Vitreous degeneration, unspecified eye: Secondary | ICD-10-CM | POA: Diagnosis not present

## 2013-01-07 ENCOUNTER — Ambulatory Visit (INDEPENDENT_AMBULATORY_CARE_PROVIDER_SITE_OTHER): Payer: Medicare Other | Admitting: General Practice

## 2013-01-07 DIAGNOSIS — I4891 Unspecified atrial fibrillation: Secondary | ICD-10-CM

## 2013-01-07 DIAGNOSIS — Z7901 Long term (current) use of anticoagulants: Secondary | ICD-10-CM | POA: Diagnosis not present

## 2013-01-07 LAB — POCT INR: INR: 2.2

## 2013-01-14 ENCOUNTER — Other Ambulatory Visit: Payer: Self-pay

## 2013-01-14 NOTE — Telephone Encounter (Signed)
Call Documentation  Jama Flavors at 11/20/2012 12:12 PM  Status: Signed          Pharm called about refill on Pravastatin. I told him that pt needs to come in for an OV because it's been over a year. Gave pt one with one refill.

## 2013-01-16 ENCOUNTER — Other Ambulatory Visit: Payer: Self-pay | Admitting: *Deleted

## 2013-01-16 MED ORDER — PRAVASTATIN SODIUM 80 MG PO TABS: 80.0000 mg | ORAL_TABLET | Freq: Every day | ORAL | Status: DC

## 2013-01-29 DIAGNOSIS — H353290 Exudative age-related macular degeneration, unspecified eye, stage unspecified: Secondary | ICD-10-CM | POA: Diagnosis not present

## 2013-02-03 ENCOUNTER — Other Ambulatory Visit: Payer: Self-pay | Admitting: Cardiovascular Disease

## 2013-02-04 ENCOUNTER — Ambulatory Visit (INDEPENDENT_AMBULATORY_CARE_PROVIDER_SITE_OTHER): Payer: Medicare Other | Admitting: General Practice

## 2013-02-04 DIAGNOSIS — I4891 Unspecified atrial fibrillation: Secondary | ICD-10-CM

## 2013-02-04 DIAGNOSIS — Z7901 Long term (current) use of anticoagulants: Secondary | ICD-10-CM | POA: Diagnosis not present

## 2013-02-26 DIAGNOSIS — H353290 Exudative age-related macular degeneration, unspecified eye, stage unspecified: Secondary | ICD-10-CM | POA: Diagnosis not present

## 2013-02-26 DIAGNOSIS — H35379 Puckering of macula, unspecified eye: Secondary | ICD-10-CM | POA: Diagnosis not present

## 2013-02-26 DIAGNOSIS — H35059 Retinal neovascularization, unspecified, unspecified eye: Secondary | ICD-10-CM | POA: Diagnosis not present

## 2013-02-26 DIAGNOSIS — H43819 Vitreous degeneration, unspecified eye: Secondary | ICD-10-CM | POA: Diagnosis not present

## 2013-03-04 ENCOUNTER — Ambulatory Visit (INDEPENDENT_AMBULATORY_CARE_PROVIDER_SITE_OTHER): Payer: Medicare Other | Admitting: General Practice

## 2013-03-04 DIAGNOSIS — Z7901 Long term (current) use of anticoagulants: Secondary | ICD-10-CM | POA: Diagnosis not present

## 2013-03-04 DIAGNOSIS — I4891 Unspecified atrial fibrillation: Secondary | ICD-10-CM | POA: Diagnosis not present

## 2013-03-04 LAB — POCT INR: INR: 2.2

## 2013-03-05 DIAGNOSIS — Z23 Encounter for immunization: Secondary | ICD-10-CM | POA: Diagnosis not present

## 2013-03-20 ENCOUNTER — Other Ambulatory Visit: Payer: Self-pay | Admitting: Cardiovascular Disease

## 2013-03-21 ENCOUNTER — Encounter: Payer: Self-pay | Admitting: Internal Medicine

## 2013-03-21 ENCOUNTER — Ambulatory Visit (INDEPENDENT_AMBULATORY_CARE_PROVIDER_SITE_OTHER): Payer: Medicare Other | Admitting: Internal Medicine

## 2013-03-21 ENCOUNTER — Other Ambulatory Visit (INDEPENDENT_AMBULATORY_CARE_PROVIDER_SITE_OTHER): Payer: Medicare Other

## 2013-03-21 VITALS — BP 132/80 | HR 81 | Temp 97.3°F | Wt 200.0 lb

## 2013-03-21 DIAGNOSIS — R209 Unspecified disturbances of skin sensation: Secondary | ICD-10-CM | POA: Diagnosis not present

## 2013-03-21 DIAGNOSIS — E782 Mixed hyperlipidemia: Secondary | ICD-10-CM

## 2013-03-21 DIAGNOSIS — H353290 Exudative age-related macular degeneration, unspecified eye, stage unspecified: Secondary | ICD-10-CM | POA: Insufficient documentation

## 2013-03-21 DIAGNOSIS — R200 Anesthesia of skin: Secondary | ICD-10-CM

## 2013-03-21 LAB — TSH: TSH: 1.83 u[IU]/mL (ref 0.35–5.50)

## 2013-03-21 LAB — LIPID PANEL
HDL: 46.9 mg/dL (ref >39.00)
Total CHOL/HDL Ratio: 4
Triglycerides: 323 mg/dL — ABNORMAL HIGH (ref 0.0–149.0)

## 2013-03-21 LAB — BASIC METABOLIC PANEL
CO2: 25 mEq/L (ref 19–32)
Glucose, Bld: 90 mg/dL (ref 70–99)
Potassium: 4.3 mEq/L (ref 3.5–5.1)
Sodium: 142 mEq/L (ref 135–145)

## 2013-03-21 LAB — CBC WITH DIFFERENTIAL/PLATELET
Basophils Absolute: 0 10*3/uL (ref 0.0–0.1)
Eosinophils Absolute: 0.3 10*3/uL (ref 0.0–0.7)
HCT: 43.4 % (ref 36.0–46.0)
Hemoglobin: 14.7 g/dL (ref 12.0–15.0)
Lymphs Abs: 2.3 10*3/uL (ref 0.7–4.0)
MCHC: 33.9 g/dL (ref 30.0–36.0)
Neutro Abs: 4.5 10*3/uL (ref 1.4–7.7)
Neutrophils Relative %: 56.8 % (ref 43.0–77.0)
Platelets: 216 10*3/uL (ref 150.0–400.0)
RDW: 13.8 % (ref 11.5–14.6)

## 2013-03-21 LAB — HEPATIC FUNCTION PANEL
ALT: 20 U/L (ref 0–35)
Alkaline Phosphatase: 66 U/L (ref 39–117)
Total Bilirubin: 0.5 mg/dL (ref 0.3–1.2)

## 2013-03-21 NOTE — Assessment & Plan Note (Signed)
On statin - last lipids reviewed Check labs annually The current medical regimen is effective;  continue present plan and medications.

## 2013-03-21 NOTE — Progress Notes (Signed)
Subjective:    Patient ID: Judy [name], female    DOB: [birth year], 79 y.o.   MRN: [id number]  HPI  Here for "tingling" in LLE Intermittent symptoms, wax and wane Always located left anterior shin and lateral side of calf Does not radiate above knee or into foot Not associated with weakness, no specific trauma recalled 2 accidental falls in past 12 months but reports unrelated to onset or episodes of leg tingling Denies prior history of leg problems including sciatica or back problems Symptoms improved with rest   Also reviewed chronic medical issues:  hypertension - the patient reports compliance with medication(s) as prescribed. Denies adverse side effects.  Dyslipidemia - on statin - the patient reports compliance with medication(s) as prescribed. Denies adverse side effects.  CAF - failed prior DCCV attempt - rate control and anticoag - follow with cards q 6-69mo for same - no TIA symptoms, palpitations or chest pain  CAD - DES 1996 following MI, then 2 others 2006, on DAP (plavix + asa 81) in addtion to warfarin for AF - the patient reports compliance with medication(s) as prescribed. Denies adverse side effects.  Past Medical History  Diagnosis Date  . CAD (coronary artery disease)     Stent RCA 96' Stent PDA and OM DES 2006  . Atrial fibrillation     chronic anticoag, failed prior DCCV  . HTN (hypertension)   . Hyperlipidemia   . Obesity   . CAROTID ARTERY DISEASE   . LBBB (left bundle branch block)    Review of Systems  Constitutional: Negative for fever and fatigue.  Musculoskeletal: Negative for back pain, gait problem and joint swelling.  Skin: Negative for rash and wound.  Neurological: Positive for numbness (L anterior/laterl shin, episodic). Negative for seizures, weakness, light-headedness and headaches.        Objective:   Physical Exam BP 132/80  Pulse 81  Temp(Src) 97.3 F (36.3 C) (Oral)  Wt 200 lb (90.719 kg)  BMI 33.81 kg/m2  SpO2 97% Wt  Readings from Last 3 Encounters:  03/21/13 200 lb (90.719 kg)  05/27/12 205 lb (92.987 kg)  10/03/11 206 lb (93.441 kg)   Constitutional: She is overweight, but appears well-developed and well-nourished. No distress.  Neck: Normal range of motion. Neck supple. No JVD present. No thyromegaly present.  Cardiovascular: Normal rate, regular rhythm and normal heart sounds.  No murmur heard. No BLE edema. Pulmonary/Chest: Effort normal and breath sounds normal. No respiratory distress. She has no wheezes.  MSkel: Back: full range of motion of thoracic and lumbar spine. Non tender to palpation. Negative straight leg raise. DTR's are symmetrically intact. Sensation intact in all dermatomes of the lower extremities. Full strength to manual muscle testing. patient is able to heel toe walk without difficulty and ambulates with normal gait. Neurological: She is alert and oriented to person, place, and time. No cranial nerve deficit. Coordination normal.  Psychiatric: She has a normal mood and affect. Her behavior is normal. Judgment and thought content normal.   Lab Results  Component Value Date   WBC 5.7 09/17/2009   HGB 13.9 09/17/2009   HCT 40.3 09/17/2009   PLT 200.0 09/17/2009   GLUCOSE 118* 09/17/2009   CHOL 170 12/05/2011   TRIG 271.0* 12/05/2011   HDL 50.50 12/05/2011   LDLDIRECT 93.1 12/05/2011   LDLCALC 61 09/17/2009   ALT 16 12/05/2011   AST 20 12/05/2011   NA 141 09/17/2009   K 4.5 09/17/2009   CL 105 09/17/2009  CREATININE 1.1 09/17/2009   BUN 17 09/17/2009   CO2 28 09/17/2009   TSH 1.88 09/17/2009   INR 2.2 03/04/2013        Assessment & Plan:   Left calf tingling and numbness, intermittent  Suspect L5-S1 radiculopathy Symptoms not present today, back exam generally benign  Check screening labs for other metabolic causes of numbness and tingling  Instructed patient on possibility of lumbar problem If labs normal and the symptoms persistent, worsening or otherwise problematic, she will call for  followup to further evaluate potential lumbar issues   See problem list. Medications and labs reviewed today.

## 2013-03-21 NOTE — Patient Instructions (Addendum)
It was good to see you today. We have reviewed your prior records including labs and tests today Test(s) ordered today. Your results will be released to MyChart (or called to you) after review, usually within 72hours after test completion. If any changes need to be made, you will be notified at that same time. Medications reviewed, no changes at this time. Handicap form provided today - take to [facility] Followup for annual exam as scheduled, call sooner if leg pain symptoms worse or unimproved

## 2013-03-25 DIAGNOSIS — H353290 Exudative age-related macular degeneration, unspecified eye, stage unspecified: Secondary | ICD-10-CM | POA: Diagnosis not present

## 2013-03-25 DIAGNOSIS — H35379 Puckering of macula, unspecified eye: Secondary | ICD-10-CM | POA: Diagnosis not present

## 2013-03-25 DIAGNOSIS — H35059 Retinal neovascularization, unspecified, unspecified eye: Secondary | ICD-10-CM | POA: Diagnosis not present

## 2013-03-26 ENCOUNTER — Telehealth: Payer: Self-pay | Admitting: *Deleted

## 2013-03-26 ENCOUNTER — Emergency Department (HOSPITAL_COMMUNITY)
Admission: EM | Admit: 2013-03-26 | Discharge: 2013-03-26 | Disposition: A | Discharge status: Home / Self Care | Payer: Medicare Other | Attending: Emergency Medicine | Admitting: Emergency Medicine

## 2013-03-26 ENCOUNTER — Other Ambulatory Visit: Payer: Self-pay

## 2013-03-26 ENCOUNTER — Ambulatory Visit: Payer: Medicare Other | Admitting: Nurse Practitioner

## 2013-03-26 ENCOUNTER — Encounter (HOSPITAL_COMMUNITY): Payer: Self-pay | Admitting: Emergency Medicine

## 2013-03-26 ENCOUNTER — Emergency Department (HOSPITAL_COMMUNITY): Payer: Medicare Other

## 2013-03-26 DIAGNOSIS — R61 Generalized hyperhidrosis: Secondary | ICD-10-CM | POA: Insufficient documentation

## 2013-03-26 DIAGNOSIS — E785 Hyperlipidemia, unspecified: Secondary | ICD-10-CM | POA: Insufficient documentation

## 2013-03-26 DIAGNOSIS — Z79899 Other long term (current) drug therapy: Secondary | ICD-10-CM | POA: Diagnosis not present

## 2013-03-26 DIAGNOSIS — Z8669 Personal history of other diseases of the nervous system and sense organs: Secondary | ICD-10-CM | POA: Diagnosis not present

## 2013-03-26 DIAGNOSIS — I2510 Atherosclerotic heart disease of native coronary artery without angina pectoris: Secondary | ICD-10-CM | POA: Diagnosis not present

## 2013-03-26 DIAGNOSIS — R079 Chest pain, unspecified: Secondary | ICD-10-CM | POA: Insufficient documentation

## 2013-03-26 DIAGNOSIS — I4891 Unspecified atrial fibrillation: Secondary | ICD-10-CM

## 2013-03-26 DIAGNOSIS — I10 Essential (primary) hypertension: Secondary | ICD-10-CM | POA: Diagnosis not present

## 2013-03-26 DIAGNOSIS — I482 Chronic atrial fibrillation, unspecified: Secondary | ICD-10-CM

## 2013-03-26 DIAGNOSIS — Z9861 Coronary angioplasty status: Secondary | ICD-10-CM | POA: Insufficient documentation

## 2013-03-26 DIAGNOSIS — Z7901 Long term (current) use of anticoagulants: Secondary | ICD-10-CM | POA: Insufficient documentation

## 2013-03-26 DIAGNOSIS — E669 Obesity, unspecified: Secondary | ICD-10-CM | POA: Insufficient documentation

## 2013-03-26 DIAGNOSIS — Z7982 Long term (current) use of aspirin: Secondary | ICD-10-CM | POA: Diagnosis not present

## 2013-03-26 LAB — BASIC METABOLIC PANEL
BUN: 19 mg/dL (ref 6–23)
Creatinine, Ser: 1.01 mg/dL (ref 0.50–1.10)
GFR calc Af Amer: 59 mL/min — ABNORMAL LOW (ref >90)
GFR calc non Af Amer: 51 mL/min — ABNORMAL LOW (ref >90)
Potassium: 4 mEq/L (ref 3.5–5.1)

## 2013-03-26 LAB — CBC
HCT: 43 % (ref 36.0–46.0)
MCH: 31.3 pg (ref 26.0–34.0)
MCHC: 34.9 g/dL (ref 30.0–36.0)
MCV: 89.8 fL (ref 78.0–100.0)
Platelets: 213 10*3/uL (ref 150–400)
RDW: 13.4 % (ref 11.5–15.5)
WBC: 7.4 10*3/uL (ref 4.0–10.5)

## 2013-03-26 LAB — TROPONIN I: Troponin I: <0.3 ng/mL (ref <0.30)

## 2013-03-26 MED ORDER — NITROGLYCERIN 0.4 MG SL SUBL: 0.4000 mg | SUBLINGUAL_TABLET | SUBLINGUAL | Status: DC | PRN

## 2013-03-26 NOTE — ED Notes (Signed)
Family at bedside. 

## 2013-03-26 NOTE — ED Notes (Signed)
Patient is resting comfortably, family at bedside

## 2013-03-26 NOTE — ED Notes (Signed)
Patient is resting comfortably, with family at bedside.

## 2013-03-26 NOTE — Telephone Encounter (Signed)
Pt called because she states woke up about 6:00 AM with pain on upper left side of her chest radiating to her left shoulder pain score of "5",  pt was  perfusing sweating as before when she had a heart attach; pt  denies SOB. Pt took all her medication including aspirin and Metoprolol. The pain has subsided after taking medication, but she still feels a little unease, she just does not feel right. Pt. Does not know if she can be seen today.

## 2013-03-26 NOTE — ED Provider Notes (Signed)
CSN: [id number]     Arrival date & time 03/26/13  8295 History   First MD Initiated Contact with Patient 03/26/13 [phone]     Chief Complaint  Patient presents with  . Chest Pain   (Consider location/radiation/quality/duration/timing/severity/associated sxs/prior Treatment) Patient is a 79 y.o. female presenting with chest pain. The history is provided by the patient and a relative. No language interpreter was used.  Chest Pain Associated symptoms: diaphoresis, palpitations and shortness of breath   Associated symptoms: no abdominal pain, no cough, no fatigue, no fever, no nausea and not vomiting    Pt is an 79yo female with hx of CAD, atrial fibrillation, HTN, LBBB, and 3 stents c/o left upper chest pain that started this morning when pt woke up around 5am.  States pain is constant but waxes and wanes, 5/10 at worst, 2/10 right now.  Pain occasionally radiates into left upper arm.  Reports breaking out into sweats for 15-65min after pain started, caused pt to become concerned as this is what happened with her MI in 1996. Also c/o mild SOB.  States symptoms subsided after taking her daily medications including aspirin, laxis, lisinopril, and potassium.  Reports feeling well until this morning. Denies recent illness, fever, cough, n/v/d. Denies hx of blood clots but does report recent fall that caused a "knot" to form in left lower leg.  States Dr. Eden Emms examined her leg and was not concerned for a clot.  Denies recent hospital stay or recent travel.   Cardiologist: Dr. [facility]  Past Medical History  Diagnosis Date  . CAD (coronary artery disease)     a. BMS to RCA 96';  b. Stent PDA and OM DES 2006  . Atrial fibrillation     a. chronic anticoag, failed prior DCCV;  b. 03/2010 Echo: EF 55-65%, mod MR, mildly to mod dil LA, mod dil RA, mild to mod MR.  Marland Kitchen HTN (hypertension)   . Hyperlipidemia   . Obesity   . CAROTID ARTERY DISEASE     a. 11/2011 Carotid U/S:  0-39% bilat dzs.   Marland Kitchen LBBB (left bundle branch block)   . Macular degeneration, wet 10/2012 dx    L>R, follows with retinal specialist->injections monthly.   Past Surgical History  Procedure Laterality Date  . Coronary stent placement  1996, 2006 x2    Drug-eluting stent placement to the PDA, drug-eluting stent  placement to the first obtuse  marginal, StarClose closure of the right common femoral arteriotomy site. Successful drug-eluting stent  placement in  both the posterior descending artery and the obtuse marginal. The lesion was directly stented using a 2.5 x 16 mm Taxus deployed at 14 atmospheres.    Family History  Problem Relation Age of Onset  . CAD Brother     deceased  . CAD Brother     deceased  . CAD Sister     deceased  . Arthritis Mother     died in her 70's  . Arthritis Father     died in MVA.   History  Substance Use Topics  . Smoking status: Never Smoker   . Smokeless tobacco: Not on file  . Alcohol Use: No   OB History   Grav Para Term Preterm Abortions TAB SAB Ect Mult Living                 Review of Systems  Constitutional: Positive for diaphoresis. Negative for fever, chills and fatigue.  Respiratory: Positive for shortness of breath. Negative  for cough and chest tightness.   Cardiovascular: Positive for chest pain and palpitations. Negative for leg swelling.  Gastrointestinal: Negative for nausea, vomiting and abdominal pain.  All other systems reviewed and are negative.    Allergies  Review of patient's allergies indicates no known allergies.  Home Medications   Current Outpatient Rx  Name  Route  Sig  Dispense  Refill  . aspirin 81 MG tablet   Oral   Take 81 mg by mouth daily.           . clopidogrel (PLAVIX) 75 MG tablet   Oral   Take 1 tablet (75 mg total) by mouth daily.   30 tablet   5   . diltiazem (CARDIZEM CD) 120 MG 24 hr capsule   Oral   Take 120 mg by mouth at bedtime.         . furosemide (LASIX) 20 MG tablet   Oral   Take 20 mg  by mouth daily.         . isosorbide mononitrate (IMDUR) 30 MG 24 hr tablet   Oral   Take 30 mg by mouth at bedtime.         Marland Kitchen lisinopril (PRINIVIL,ZESTRIL) 10 MG tablet   Oral   Take 1 tablet (10 mg total) by mouth daily.   90 tablet   4   . loratadine (CLARITIN) 10 MG tablet   Oral   Take 10 mg by mouth daily.         . metoprolol succinate (TOPROL-XL) 100 MG 24 hr tablet   Oral   Take 100 mg by mouth 2 (two) times daily. Take with or immediately following a meal.         . Multiple Vitamin (MULTIVITAMIN WITH MINERALS) TABS tablet   Oral   Take 1 tablet by mouth daily.         . potassium chloride (MICRO-K) 10 MEQ CR capsule   Oral   Take 10 mEq by mouth daily.         . pravastatin (PRAVACHOL) 80 MG tablet   Oral   Take 80 mg by mouth at bedtime.         Marland Kitchen tobramycin (TOBREX) 0.3 % ophthalmic solution   Left Eye   Place 1 drop into the left eye See admin instructions. Use for 2 days prior to procedure and day of procedure.         . warfarin (COUMADIN) 2.5 MG tablet   Oral   Take 2.5 mg by mouth every evening. Take 0.5 tablet on Tuesday and Take 1 tablet the rest of the week         . nitroGLYCERIN (NITROSTAT) 0.4 MG SL tablet   Sublingual   Place 1 tablet (0.4 mg total) under the tongue every 5 (five) minutes as needed for chest pain.   25 tablet   3    BP 127/60  Pulse 76  Temp(Src) 98.1 F (36.7 C) (Oral)  Resp 17  Ht 5\' 4"  (1.626 m)  Wt 200 lb (90.719 kg)  BMI 34.31 kg/m2  SpO2 98% Physical Exam  Nursing note and vitals reviewed. Constitutional: She appears well-developed and well-nourished. No distress.  Pt lying comfortably in exam bed, NAD.   HENT:  Head: Normocephalic and atraumatic.  Eyes: Conjunctivae are normal. No scleral icterus.  Neck: Normal range of motion.  Cardiovascular: Normal rate and normal heart sounds.  An irregularly irregular rhythm present.  Pulmonary/Chest: Effort normal and breath  sounds normal. No  respiratory distress. She has no wheezes. She has no rales. She exhibits no tenderness.  No respiratory distress, able to speak in full sentences w/o difficulty. Lungs: CTAB.  No chest wall tenderness.  Abdominal: Soft. Bowel sounds are normal. She exhibits no distension and no mass. There is no tenderness. There is no rebound and no guarding.  Musculoskeletal: Normal range of motion.  Neurological: She is alert.  Skin: Skin is warm and dry. She is not diaphoretic.    ED Course  Procedures (including critical care time) Labs Review Labs Reviewed  BASIC METABOLIC PANEL - Abnormal; Notable for the following:    Glucose, Bld 114 (*)    GFR calc non Af Amer 51 (*)    GFR calc Af Amer 59 (*)    All other components within normal limits  PRO B NATRIURETIC PEPTIDE - Abnormal; Notable for the following:    Pro B Natriuretic peptide (BNP) 717.1 (*)    All other components within normal limits  PROTIME-INR - Abnormal; Notable for the following:    Prothrombin Time 21.7 (*)    INR 1.96 (*)    All other components within normal limits  CBC  TROPONIN I  POCT I-STAT TROPONIN I   Imaging Review Dg Chest Port 1 View  03/26/2013   CLINICAL DATA:  Left chest pain.  EXAM: PORTABLE CHEST - 1 VIEW  COMPARISON:  05/17/2005.  FINDINGS: Trachea is midline. Heart is enlarged, stable. Mild scarring in the medial lung bases. Lungs are otherwise clear. No pleural fluid.  IMPRESSION: No acute findings.   Electronically Signed   By: Leanna Battles M.D.   On: 03/26/2013 09:58    EKG Interpretation   None       MDM   1. Chest pain   2. Atrial fibrillation   3. Coronary atherosclerosis of native coronary artery   4. Essential hypertension, benign   5. Chronic a-fib   6. CAD (coronary artery disease)    Pt with hx of CAD and 3 stents presenting with left upper chest pain that started this a.m. Pt called Dr. Eden Emms, [facility], was told she would be seen by a physician extender after 11am.  Pt  came to ED due to concern of diaphoresis with her CP.  Will perform cardiac workup.  Pain is 2/10 at this time. No hx of chest wall injury or recent illness. No hx of blood clots.  EKG: unremarkable (seen by Dr. Patria Mane) Troponin negative.  BNP pending, however lungs: CTAB, CXR unremarkable.  Will consult [facility] as pt was expected to f/u with them later today.  Spoke with Trish from [facility] who will send a provider from [place] to come see pt.  Dr. Delton See eaxmined pt and agreed pt may be discharged home to f/u in office.          Junius Finner, PA-C 03/27/13 1229

## 2013-03-26 NOTE — ED Notes (Signed)
Patient was extremely upset because she had been told she was going home.  The patient's daughter was extremely upset as well and said "something is off".  I advised them to just be patient with [place] and I would speak with the MD and PA to figure out what was going on.  I advised Meriam Sprague to contact [facility] in attempt to ascertain if she was going to be discharged.  The patient said take all this stuff off, including the IV so I did.  She said she was leaving by 1400hrs regardless.  I advised her that she has the right to refuse care and that we could not hold her here.  The daughter also said she needed something to eat because she had not eaten all day.  I advised [place], PA and she said I could give her some food.

## 2013-03-26 NOTE — ED Notes (Signed)
Pt discharged.Vital signs stable and GCS 15 

## 2013-03-26 NOTE — H&P (Signed)
Patient ID: VIVIKA POYTHRESS MRN: [id number], DOB/AGE: Dec 01, 1930   Admit date: 03/26/2013  Primary Physician: Rene Paci, MD Primary Cardiologist: P. Eden Emms, MD   Pt. Profile:  79 y/o female with a h/o CAD s/p prior RCA, PDA, and OM stenting, who presented to the ED this AM after awakening with chest pain.  Problem List  Past Medical History  Diagnosis Date  . CAD (coronary artery disease)     a. BMS to RCA 96';  b. Stent PDA and OM DES 2006  . Atrial fibrillation     a. chronic anticoag, failed prior DCCV;  b. 03/2010 Echo: EF 55-65%, mod MR, mildly to mod dil LA, mod dil RA, mild to mod MR.  Marland Kitchen HTN (hypertension)   . Hyperlipidemia   . Obesity   . CAROTID ARTERY DISEASE     a. 11/2011 Carotid U/S:  0-39% bilat dzs.  Marland Kitchen LBBB (left bundle branch block)   . Macular degeneration, wet 10/2012 dx    L>R, follows with retinal specialist->injections monthly.    Past Surgical History  Procedure Laterality Date  . Coronary stent placement  1996, 2006 x2    Drug-eluting stent placement to the PDA, drug-eluting stent  placement to the first obtuse  marginal, StarClose closure of the right common femoral arteriotomy site. Successful drug-eluting stent  placement in  both the posterior descending artery and the obtuse marginal. The lesion was directly stented using a 2.5 x 16 mm Taxus deployed at 14 atmospheres.     Allergies  No Known Allergies  HPI  79 year old female with prior history of coronary artery disease status post prior stenting of the right coronary artery in 1996 and subsequent drilling stenting of the PDA and OM in 2006. Since then, she has done quite well. She lives by herself in [place] and is quite active around her home without symptoms or limitations. She does have macular degeneration requires monthly injections of the left eye and every other monthly injections into the right eye. Yesterday she had her left eye injected but otherwise it was a pretty normal  day. This morning, she awoke at 5 AM when she heard that it was raining outside and noticed discomfort in her left lateral lower neck just above the left clavicle that was worse when she turned her head to the far right. She didn't think too much of this and laid in bed until about 6:30. She got up at 6:30, she put on a rain jacket, went outside to get the newspaper and then came back inside. When she came back into the house, she noted that she was slightly diaphoretic but otherwise was not having chest pain, dyspnea, lightheadedness, dizziness, weakness. The diaphoresis persisted for about 15-20 minutes and then resolve spontaneously. At no point did she have associated symptoms. Because of the episode of diaphoresis and also ongoing left lower neck/supraclavicular discomfort, she called in to the office to make an appointment and was advised to present to the emergency department. Here, ECG is nonacute and initial point-of-care troponin is normal. She continues to have pain in the left supraclavicular area that is worsened when she turns her head to the right. This area is nontender to palpation.  Home Medications  Prior to Admission medications   Medication Sig Start Date End Date Taking? Authorizing Provider  aspirin 81 MG tablet Take 81 mg by mouth daily.     Yes Historical Provider, MD  clopidogrel (PLAVIX) 75 MG tablet Take 1 tablet (75  mg total) by mouth daily. 11/27/12  Yes Wendall Stade, MD  diltiazem (CARDIZEM CD) 120 MG 24 hr capsule Take 120 mg by mouth at bedtime.   Yes Historical Provider, MD  furosemide (LASIX) 20 MG tablet Take 20 mg by mouth daily.   Yes Historical Provider, MD  isosorbide mononitrate (IMDUR) 30 MG 24 hr tablet Take 30 mg by mouth at bedtime.   Yes Historical Provider, MD  lisinopril (PRINIVIL,ZESTRIL) 10 MG tablet Take 1 tablet (10 mg total) by mouth daily. 05/08/12  Yes Wendall Stade, MD  loratadine (CLARITIN) 10 MG tablet Take 10 mg by mouth daily.   Yes Historical  Provider, MD  metoprolol succinate (TOPROL-XL) 100 MG 24 hr tablet Take 100 mg by mouth 2 (two) times daily. Take with or immediately following a meal.   Yes Historical Provider, MD  Multiple Vitamin (MULTIVITAMIN WITH MINERALS) TABS tablet Take 1 tablet by mouth daily.   Yes Historical Provider, MD  potassium chloride (MICRO-K) 10 MEQ CR capsule Take 10 mEq by mouth daily.   Yes Historical Provider, MD  pravastatin (PRAVACHOL) 80 MG tablet Take 80 mg by mouth at bedtime.   Yes Historical Provider, MD  tobramycin (TOBREX) 0.3 % ophthalmic solution Place 1 drop into the left eye See admin instructions. Use for 2 days prior to procedure and day of procedure. 01/02/13  Yes Historical Provider, MD  warfarin (COUMADIN) 2.5 MG tablet Take 2.5 mg by mouth every evening. Take 0.5 tablet on Tuesday and Take 1 tablet the rest of the week   Yes Historical Provider, MD    Family History  Family History  Problem Relation Age of Onset  . CAD Brother     deceased  . CAD Brother     deceased  . CAD Sister     deceased  . Arthritis Mother     died in her 76's  . Arthritis Father     died in MVA.    Social History  History   Social History  . Marital Status: Widowed    Spouse Name: N/A    Number of Children: N/A  . Years of Education: N/A   Occupational History  . Not on file.   Social History Main Topics  . Smoking status: Never Smoker   . Smokeless tobacco: Not on file  . Alcohol Use: No  . Drug Use: No  . Sexual Activity: Not on file   Other Topics Concern  . Not on file   Social History Narrative   Lives in [place] by herself.  She is active around the house w/o symptoms or limitations.     Review of Systems General: Diaphoretic spell this morning without associated symptoms as outlined above. No chills, fever, night sweats or weight changes.  Cardiovascular:  No chest pain, dyspnea on exertion, edema, orthopnea, palpitations, paroxysmal nocturnal dyspnea. Dermatological: No rash,  lesions/masses Respiratory: No cough, dyspnea Urologic: No hematuria, dysuria Abdominal:   No nausea, vomiting, diarrhea, bright red blood per rectum, melena, or hematemesis Neurologic:  No visual changes, wkns, changes in mental status.  musculoskeletal: Left supraclavicular discomfort. All other systems reviewed and are otherwise negative except as noted above.  Physical Exam  Blood pressure 127/46, pulse 75, temperature 98.1 F (36.7 C), temperature source Oral, resp. rate 29, height 5\' 4"  (1.626 m), weight 200 lb (90.719 kg), SpO2 98.00%.  General: Pleasant, NAD Psych: Normal affect. Neuro: Alert and oriented X 3. Moves all extremities spontaneously. HEENT: Normal  Neck: Supple  without bruits or JVD. Lungs:  Resp regular and unlabored, CTA. Heart: RRR no s3, s4, or murmurs. Abdomen: Soft, non-tender, non-distended, BS + x 4.  Extremities: No clubbing, cyanosis or edema. DP/PT/Radials 2+ and equal bilaterally.  Labs  Trop i, poc: 0.01  PBNP: 717.1  No results found for this basename: CKTOTAL, CKMB, TROPONINI,  in the last 72 hours Lab Results  Component Value Date   WBC 7.4 03/26/2013   HGB 15.0 03/26/2013   HCT 43.0 03/26/2013   MCV 89.8 03/26/2013   PLT 213 03/26/2013     Recent Labs Lab 03/21/13 1533 03/26/13 0945  NA 142 139  K 4.3 4.0  CL 107 104  CO2 25 22  BUN 23 19  CREATININE 1.1 1.01  CALCIUM 9.7 9.3  PROT 7.7  --   BILITOT 0.5  --   ALKPHOS 66  --   ALT 20  --   AST 24  --   GLUCOSE 90 114*   Lab Results  Component Value Date   CHOL 204* 03/21/2013   HDL 46.90 03/21/2013   LDLCALC 61 09/17/2009   TRIG 323.0* 03/21/2013   Radiology/Studies  Dg Chest Port 1 View  03/26/2013   CLINICAL DATA:  Left chest pain.  EXAM: PORTABLE CHEST - 1 VIEW  COMPARISON:  05/17/2005.  FINDINGS: Trachea is midline. Heart is enlarged, stable. Mild scarring in the medial lung bases. Lungs are otherwise clear. No pleural fluid.  IMPRESSION: No acute findings.    Electronically Signed   By: Leanna Battles M.D.   On: 03/26/2013 09:58   ECG  Af, 75, inf/ant infarcts, no acute st/t changes.  ASSESSMENT AND PLAN  1. Left supraclavicular pain: This is worsened when patient moves her head to the right. It is not worse with palpation. Suspect this is more musculoskeletal in nature. ECG is nonacute and initial troponin is normal.  2. Diaphoretic episode: Patient had an episode of diaphoresis that occurred after coming back inside from getting the newspaper. She notes that she was wearing a ring cutter at the time and felt warm in her house. Interestingly, she did not have any chest pain, dyspnea, presyncope, or syncope. The episode lasted for 15-20 minutes and resolve spontaneously. She's had no recurrence. She reports that she had similar diaphoresis in the setting of her MI several years ago but at that time, she also had dyspnea and chest pain, both of which were absent today. We'll repeat a troponin here in the ED and if normal, we'll plan to discharge.  3. Coronary artery disease: See #1 and 2. Patient otherwise doing well. Repeat troponin. Continue home medications which include statin, beta blocker, and nitrates. She is also on aspirin and Plavix. Last stent was placed in 2006. Given that she is also on long-term anticoagulation with Coumadin, we should consider discontinuation of aspirin or Plavix therapy.  4. Chronic atrial fibrillation: Rate controlled on beta blocker and diltiazem. Continue Coumadin.  5. Hyperlipidemia: Continue statin therapy. Recent triglycerides were markedly elevated at 323. Consider addition of a fibroid as an outpatient.  Signed, Nicolasa Ducking, MD 03/26/2013, 11:47 AM   The patient was seen, examined and discussed with Ward Givens, PA-C and I agree with the above.  In summary, a patient with known CAD is coming with positional supraclavicular pain that is now resolved. The first two troponins are negative and ECG is  unchanged from the prior. We will dischrge the patient home and follow in Dr. Fabio Bering clinic.  The patient was seen, examined and discussed with Ward Givens, PA-C and I agree with the above.  03/26/2013

## 2013-03-26 NOTE — ED Notes (Signed)
Patient said she started having pain this morning about 0500 this morning, but it was more so at 0630.  She got ready and called the Cardiologist on the way to their office.  She could not be seen until 1100, so they advised her to come to the ED.  The only thing the patient took was a baby Aspirin.

## 2013-03-26 NOTE — ED Notes (Signed)
Onset today at 0500 woke up with left sided chest pain. At 0630 walked out side to get the newspaper and became SOB and diaphoretic. Pain currently 3/10 pressure achy. History of MI and 3 stents.

## 2013-03-26 NOTE — Telephone Encounter (Signed)
Spoke with Dr. Eden Emms which states, need to let the DOD know so she can be seen today. An appointment was made with Norma Fredrickson NP at 11:00 AM. I attempted to called pt several times to let her now about appointment  without success. Then called her daughter. Which states she is on his way to the office because she can't way. Pt was made aware that she needs to go to the ER. Pt and daughter verbalized understanding.

## 2013-03-28 NOTE — ED Provider Notes (Signed)
Medical screening examination/treatment/procedure(s) were conducted as a shared visit with non-physician practitioner(s) and myself.  I personally evaluated the patient during the encounter  Overall well appearing. Will ask [facility] cards to evaluate at bedside  Lyanne Co, MD 03/28/13 [phone]

## 2013-03-31 ENCOUNTER — Other Ambulatory Visit: Payer: Self-pay | Admitting: Internal Medicine

## 2013-04-15 ENCOUNTER — Ambulatory Visit (INDEPENDENT_AMBULATORY_CARE_PROVIDER_SITE_OTHER): Payer: Medicare Other | Admitting: General Practice

## 2013-04-15 DIAGNOSIS — I4891 Unspecified atrial fibrillation: Secondary | ICD-10-CM | POA: Diagnosis not present

## 2013-04-15 DIAGNOSIS — Z7901 Long term (current) use of anticoagulants: Secondary | ICD-10-CM | POA: Diagnosis not present

## 2013-04-15 LAB — POCT INR: INR: 2.7

## 2013-04-15 NOTE — Progress Notes (Signed)
Pre-visit discussion using our clinic review tool. No additional management support is needed unless otherwise documented below in the visit note.  

## 2013-04-22 DIAGNOSIS — H35379 Puckering of macula, unspecified eye: Secondary | ICD-10-CM | POA: Diagnosis not present

## 2013-04-22 DIAGNOSIS — H353290 Exudative age-related macular degeneration, unspecified eye, stage unspecified: Secondary | ICD-10-CM | POA: Diagnosis not present

## 2013-04-22 DIAGNOSIS — H35059 Retinal neovascularization, unspecified, unspecified eye: Secondary | ICD-10-CM | POA: Diagnosis not present

## 2013-04-22 DIAGNOSIS — H43819 Vitreous degeneration, unspecified eye: Secondary | ICD-10-CM | POA: Diagnosis not present

## 2013-05-12 ENCOUNTER — Other Ambulatory Visit: Payer: Self-pay | Admitting: Cardiovascular Disease

## 2013-05-19 ENCOUNTER — Other Ambulatory Visit: Payer: Self-pay | Admitting: Cardiovascular Disease

## 2013-05-27 DIAGNOSIS — H353290 Exudative age-related macular degeneration, unspecified eye, stage unspecified: Secondary | ICD-10-CM | POA: Diagnosis not present

## 2013-05-27 DIAGNOSIS — H35059 Retinal neovascularization, unspecified, unspecified eye: Secondary | ICD-10-CM | POA: Diagnosis not present

## 2013-05-28 ENCOUNTER — Ambulatory Visit (INDEPENDENT_AMBULATORY_CARE_PROVIDER_SITE_OTHER): Payer: Medicare Other | Admitting: General Practice

## 2013-05-28 ENCOUNTER — Ambulatory Visit (INDEPENDENT_AMBULATORY_CARE_PROVIDER_SITE_OTHER): Payer: Medicare Other | Admitting: Internal Medicine

## 2013-05-28 ENCOUNTER — Encounter: Payer: Self-pay | Admitting: Internal Medicine

## 2013-05-28 VITALS — BP 120/76 | HR 81 | Temp 97.7°F | Ht 64.5 in | Wt 204.4 lb

## 2013-05-28 DIAGNOSIS — M5380 Other specified dorsopathies, site unspecified: Secondary | ICD-10-CM

## 2013-05-28 DIAGNOSIS — M62830 Muscle spasm of back: Secondary | ICD-10-CM

## 2013-05-28 DIAGNOSIS — Z7901 Long term (current) use of anticoagulants: Secondary | ICD-10-CM | POA: Diagnosis not present

## 2013-05-28 DIAGNOSIS — I2510 Atherosclerotic heart disease of native coronary artery without angina pectoris: Secondary | ICD-10-CM

## 2013-05-28 DIAGNOSIS — Z23 Encounter for immunization: Secondary | ICD-10-CM

## 2013-05-28 DIAGNOSIS — E782 Mixed hyperlipidemia: Secondary | ICD-10-CM

## 2013-05-28 DIAGNOSIS — E669 Obesity, unspecified: Secondary | ICD-10-CM | POA: Diagnosis not present

## 2013-05-28 DIAGNOSIS — Z0000 Encounter for general adult medical examination without abnormal findings: Secondary | ICD-10-CM | POA: Diagnosis not present

## 2013-05-28 DIAGNOSIS — J309 Allergic rhinitis, unspecified: Secondary | ICD-10-CM

## 2013-05-28 DIAGNOSIS — I4891 Unspecified atrial fibrillation: Secondary | ICD-10-CM | POA: Diagnosis not present

## 2013-05-28 DIAGNOSIS — I10 Essential (primary) hypertension: Secondary | ICD-10-CM

## 2013-05-28 LAB — POCT INR: INR: 2.2

## 2013-05-28 MED ORDER — METHOCARBAMOL 500 MG PO TABS: 500.0000 mg | ORAL_TABLET | Freq: Three times a day (TID) | ORAL | Status: DC | PRN

## 2013-05-28 MED ORDER — METOPROLOL SUCCINATE ER 100 MG PO TB24: 100.0000 mg | ORAL_TABLET | Freq: Every day | ORAL | Status: DC

## 2013-05-28 MED ORDER — CETIRIZINE HCL 10 MG PO TABS: 10.0000 mg | ORAL_TABLET | Freq: Every day | ORAL | Status: DC

## 2013-05-28 MED ORDER — FLUTICASONE PROPIONATE 50 MCG/ACT NA SUSP: 1.0000 | Freq: Every day | NASAL | Status: DC

## 2013-05-28 NOTE — Assessment & Plan Note (Signed)
MI with BMS stent in '96 to RCA, then 2 stents DES to PDA and OM with cath 2006 No anginal symptoms The current medical regimen is effective;  continue present plan and medications.  Will help arrange cards follow up as reports she is overdue

## 2013-05-28 NOTE — Progress Notes (Signed)
Pre-visit discussion using our clinic review tool. No additional management support is needed unless otherwise documented below in the visit note.  

## 2013-05-28 NOTE — Assessment & Plan Note (Signed)
On statin - last lipids reviewed Check labs annually Consider change to atorva or rouva - to achieve goal LDL<70 - but pt wishes to discuss with cards 1st

## 2013-05-28 NOTE — Addendum Note (Signed)
Addended by: Rene Paci A on: 05/28/2013 10:12 AM   Modules accepted: Orders

## 2013-05-28 NOTE — Patient Instructions (Addendum)
It was good to see you today.  We have reviewed your prior records including labs and tests today  Health Maintenance reviewed - all recommended immunizations and age-appropriate screenings are up-to-date. Pneumonia vaccine updated today  Medications reviewed and updated,  Try Zyrtec once daily for allergies and Flonase or Nasonex spray for allergies -no changes recommended at this time.  also discussed with Dr. Eden Emms consideration of changing pravastatin to generic Lipitor or Crestor for better control of cholesterol  we'll make referral for Annual cardiology appointment . Our office will contact you regarding appointment(s) once made.  Please schedule followup in 1 year for annual wellness exam and labs, call sooner if problems.  Health Maintenance, Female A healthy lifestyle and preventative care can promote health and wellness.  Maintain regular health, dental, and eye exams.  Eat a healthy diet. Foods like vegetables, fruits, whole grains, low-fat dairy products, and lean protein foods contain the nutrients you need without too many calories. Decrease your intake of foods high in solid fats, added sugars, and salt. Get information about a proper diet from your caregiver, if necessary.  Regular physical exercise is one of the most important things you can do for your health. Most adults should get at least 150 minutes of moderate-intensity exercise (any activity that increases your heart rate and causes you to sweat) each week. In addition, most adults need muscle-strengthening exercises on 2 or more days a week.   Maintain a healthy weight. The body mass index (BMI) is a screening tool to identify possible weight problems. It provides an estimate of body fat based on height and weight. Your caregiver can help determine your BMI, and can help you achieve or maintain a healthy weight. For adults 20 years and older:  A BMI below 18.5 is considered underweight.  A BMI of 18.5 to 24.9  is normal.  A BMI of 25 to 29.9 is considered overweight.  A BMI of 30 and above is considered obese.  Maintain normal blood lipids and cholesterol by exercising and minimizing your intake of saturated fat. Eat a balanced diet with plenty of fruits and vegetables. Blood tests for lipids and cholesterol should begin at age 14 and be repeated every 5 years. If your lipid or cholesterol levels are high, you are over 50, or you are a high risk for heart disease, you may need your cholesterol levels checked more frequently.Ongoing high lipid and cholesterol levels should be treated with medicines if diet and exercise are not effective.  If you smoke, find out from your caregiver how to quit. If you do not use tobacco, do not start.  Lung cancer screening is recommended for adults aged 70 80 years who are at high risk for developing lung cancer because of a history of smoking. Yearly low-dose computed tomography (CT) is recommended for people who have at least a 30-pack-year history of smoking and are a current smoker or have quit within the past 15 years. A pack year of smoking is smoking an average of 1 pack of cigarettes a day for 1 year (for example: 1 pack a day for 30 years or 2 packs a day for 15 years). Yearly screening should continue until the smoker has stopped smoking for at least 15 years. Yearly screening should also be stopped for people who develop a health problem that would prevent them from having lung cancer treatment.  If you are pregnant, do not drink alcohol. If you are breastfeeding, be very cautious about drinking alcohol.  If you are not pregnant and choose to drink alcohol, do not exceed 1 drink per day. One drink is considered to be 12 ounces (355 mL) of beer, 5 ounces (148 mL) of wine, or 1.5 ounces (44 mL) of liquor.  Avoid use of street drugs. Do not share needles with anyone. Ask for help if you need support or instructions about stopping the use of drugs.  High blood  pressure causes heart disease and increases the risk of stroke. Blood pressure should be checked at least every 1 to 2 years. Ongoing high blood pressure should be treated with medicines, if weight loss and exercise are not effective.  If you are 67 to 77 years old, ask your caregiver if you should take aspirin to prevent strokes.  Diabetes screening involves taking a blood sample to check your fasting blood sugar level. This should be done once every 3 years, after age 32, if you are within normal weight and without risk factors for diabetes. Testing should be considered at a younger age or be carried out more frequently if you are overweight and have at least 1 risk factor for diabetes.  Breast cancer screening is essential preventative care for women. You should practice "breast self-awareness." This means understanding the normal appearance and feel of your breasts and may include breast self-examination. Any changes detected, no matter how small, should be reported to a caregiver. Women in their 43s and 30s should have a clinical breast exam (CBE) by a caregiver as part of a regular health exam every 1 to 3 years. After age 59, women should have a CBE every year. Starting at age 21, women should consider having a mammogram (breast X-ray) every year. Women who have a family history of breast cancer should talk to their caregiver about genetic screening. Women at a high risk of breast cancer should talk to their caregiver about having an MRI and a mammogram every year.  Breast cancer gene (BRCA)-related cancer risk assessment is recommended for women who have family members with BRCA-related cancers. BRCA-related cancers include breast, ovarian, tubal, and peritoneal cancers. Having family members with these cancers may be associated with an increased risk for harmful changes (mutations) in the breast cancer genes BRCA1 and BRCA2. Results of the assessment will determine the need for genetic counseling  and BRCA1 and BRCA2 testing.  The Pap test is a screening test for cervical cancer. Women should have a Pap test starting at age 48. Between ages 23 and 41, Pap tests should be repeated every 2 years. Beginning at age 8, you should have a Pap test every 3 years as long as the past 3 Pap tests have been normal. If you had a hysterectomy for a problem that was not cancer or a condition that could lead to cancer, then you no longer need Pap tests. If you are between ages 43 and 41, and you have had normal Pap tests going back 10 years, you no longer need Pap tests. If you have had past treatment for cervical cancer or a condition that could lead to cancer, you need Pap tests and screening for cancer for at least 20 years after your treatment. If Pap tests have been discontinued, risk factors (such as a new sexual partner) need to be reassessed to determine if screening should be resumed. Some women have medical problems that increase the chance of getting cervical cancer. In these cases, your caregiver may recommend more frequent screening and Pap tests.  The human papillomavirus (  HPV) test is an additional test that may be used for cervical cancer screening. The HPV test looks for the virus that can cause the cell changes on the cervix. The cells collected during the Pap test can be tested for HPV. The HPV test could be used to screen women aged 19 years and older, and should be used in women of any age who have unclear Pap test results. After the age of 75, women should have HPV testing at the same frequency as a Pap test.  Colorectal cancer can be detected and often prevented. Most routine colorectal cancer screening begins at the age of 39 and continues through age 54. However, your caregiver may recommend screening at an earlier age if you have risk factors for colon cancer. On a yearly basis, your caregiver may provide home test kits to check for hidden blood in the stool. Use of a small camera at the end  of a tube, to directly examine the colon (sigmoidoscopy or colonoscopy), can detect the earliest forms of colorectal cancer. Talk to your caregiver about this at age 85, when routine screening begins. Direct examination of the colon should be repeated every 5 to 10 years through age 1, unless early forms of pre-cancerous polyps or small growths are found.  Hepatitis C blood testing is recommended for all people born from [birth year] through 1963 and any individual with known risks for hepatitis C.  Practice safe sex. Use condoms and avoid high-risk sexual practices to reduce the spread of sexually transmitted infections (STIs). Sexually active women aged 48 and younger should be checked for Chlamydia, which is a common sexually transmitted infection. Older women with new or multiple partners should also be tested for Chlamydia. Testing for other STIs is recommended if you are sexually active and at increased risk.  Osteoporosis is a disease in which the bones lose minerals and strength with aging. This can result in serious bone fractures. The risk of osteoporosis can be identified using a bone density scan. Women ages 29 and over and women at risk for fractures or osteoporosis should discuss screening with their caregivers. Ask your caregiver whether you should be taking a calcium supplement or vitamin D to reduce the rate of osteoporosis.  Menopause can be associated with physical symptoms and risks. Hormone replacement therapy is available to decrease symptoms and risks. You should talk to your caregiver about whether hormone replacement therapy is right for you.  Use sunscreen. Apply sunscreen liberally and repeatedly throughout the day. You should seek shade when your shadow is shorter than you. Protect yourself by wearing long sleeves, pants, a wide-brimmed hat, and sunglasses year round, whenever you are outdoors.  Notify your caregiver of new moles or changes in moles, especially if there is a change  in shape or color. Also notify your caregiver if a mole is larger than the size of a pencil eraser.  Stay current with your immunizations. Document Released: 12/12/2010 Document Revised: 09/23/2012 Document Reviewed: 12/12/2010 [facility] Patient Information 2014 [place], [place]. Hypertension Hypertension is another name for high blood pressure. High blood pressure may mean that your heart needs to work harder to pump blood. Blood pressure consists of two numbers, which includes a higher number over a lower number (example: 110/72). HOME CARE   Make lifestyle changes as told by your doctor. This may include weight loss and exercise.  Take your blood pressure medicine every day.  Limit how much salt you use.  Stop smoking if you smoke.  Do  not use drugs.  Talk to your doctor if you are using decongestants or birth control pills. These medicines might make blood pressure higher.  Females should not drink more than 1 alcoholic drink per day. Males should not drink more than 2 alcoholic drinks per day.  See your doctor as told. GET HELP RIGHT AWAY IF:   You have a blood pressure reading with a top number of 180 or higher.  You get a very bad headache.  You get blurred or changing vision.  You feel confused.  You feel weak, numb, or faint.  You get chest or belly (abdominal) pain.  You throw up (vomit).  You cannot breathe very well. MAKE SURE YOU:   Understand these instructions.  Will watch your condition.  Will get help right away if you are not doing well or get worse. Document Released: 11/15/2007 Document Revised: 08/21/2011 Document Reviewed: 11/15/2007 [facility] Patient Information 2014 [place], [place].

## 2013-05-28 NOTE — Assessment & Plan Note (Signed)
BP Readings from Last 3 Encounters:  05/28/13 120/76  03/26/13 127/60  03/21/13 132/80   The current medical regimen is effective;  continue present plan and medications.

## 2013-05-28 NOTE — Assessment & Plan Note (Signed)
Wt Readings from Last 3 Encounters:  05/28/13 204 lb 6.4 oz (92.715 kg)  03/26/13 200 lb (90.719 kg)  03/21/13 200 lb (90.719 kg)   Weight trends reviewed The patient is asked to make an attempt to improve diet and exercise patterns to aid in medical management of this problem.

## 2013-05-28 NOTE — Assessment & Plan Note (Signed)
Rate control and chronic anticoag with [facility]) - failed DCCV attempts in past Also follows with cards annually asymptomatic - The current medical regimen is effective;  continue present plan and medications.

## 2013-05-28 NOTE — Progress Notes (Addendum)
Subjective:    Patient ID: Judy [name], female    DOB: 1931-05-30, 79 y.o.   MRN: [id number]  HPI   Here for medicare wellness  Diet: heart healthy or DM if diabetic Physical activity: sedentary Depression/mood screen: negative Hearing: intact to whispered voice Visual acuity: grossly normal, performs annual eye exam  ADLs: capable Fall risk: none Home safety: good Cognitive evaluation: intact to orientation, naming, recall and repetition EOL planning: adv directives, full code/ I agree  I have personally reviewed and have noted 1. The patient's medical and social history 2. Their use of alcohol, tobacco or illicit drugs 3. Their current medications and supplements 4. The patient's functional ability including ADL's, fall risks, home safety risks and hearing or visual impairment. 5. Diet and physical activities 6. Evidence for depression or mood disorders  Also reviewed chronic medical issues:  hypertension - the patient reports compliance with medication(s) as prescribed. Denies adverse side effects.  Dyslipidemia - on statin - the patient reports compliance with medication(s) as prescribed. Denies adverse side effects.  CAF - failed prior DCCV attempt - rate control and anticoag - follow with cards q 6-25mo for same - no TIA symptoms, palpitations or chest pain  CAD - DES 1996 following MI, then 2 others 2006, on DAP (plavix + asa 81) in addtion to warfarin for AF - the patient reports compliance with medication(s) as prescribed. Denies adverse side effects.  Past Medical History  Diagnosis Date  . CAD (coronary artery disease)     a. BMS to RCA 96';  b. Stent PDA and OM DES 2006  . Atrial fibrillation     a. chronic anticoag, failed prior DCCV;  b. 03/2010 Echo: EF 55-65%, mod MR, mildly to mod dil LA, mod dil RA, mild to mod MR.  Marland Kitchen HTN (hypertension)   . Hyperlipidemia   . Obesity   . CAROTID ARTERY DISEASE     a. 11/2011 Carotid U/S:  0-39% bilat dzs.  Marland Kitchen LBBB  (left bundle branch block)   . Macular degeneration, wet 10/2012 dx    L>R, follows with retinal specialist->injections monthly.   Family History  Problem Relation Age of Onset  . CAD Brother     deceased  . CAD Brother     deceased  . CAD Sister     deceased  . Arthritis Mother     died in her 30's  . Arthritis Father     died in MVA.   History  Substance Use Topics  . Smoking status: Never Smoker   . Smokeless tobacco: Not on file  . Alcohol Use: No    Review of Systems  Constitutional: Negative for fever and fatigue.  HENT: Positive for postnasal drip and rhinorrhea.   Respiratory: Negative for cough and shortness of breath.   Cardiovascular: Negative for chest pain and leg swelling.  Musculoskeletal: Negative for back pain, gait problem and joint swelling.  Skin: Negative for rash and wound.  Neurological: Positive for numbness (L anterior/laterl shin, episodic -improving). Negative for seizures, weakness, light-headedness and headaches.  All other systems reviewed and are negative.        Objective:   Physical Exam BP 120/76  Pulse 81  Temp(Src) 97.7 F (36.5 C) (Oral)  Ht 5' 4.5" (1.638 m)  Wt 204 lb 6.4 oz (92.715 kg)  BMI 34.56 kg/m2  SpO2 97% Wt Readings from Last 3 Encounters:  05/28/13 204 lb 6.4 oz (92.715 kg)  03/26/13 200 lb (90.719 kg)  03/21/13  200 lb (90.719 kg)   Constitutional: She is overweight, but appears well-developed and well-nourished. No distress.  Neck: Normal range of motion. Neck supple. No JVD present. No thyromegaly present.  Cardiovascular: Normal rate, irregular rhythm and normal heart sounds.  No murmur heard. No BLE edema. Pulmonary/Chest: Effort normal and breath sounds normal. No respiratory distress. She has no wheezes.  Neurological: She is alert and oriented to person, place, and time. No cranial nerve deficit. Coordination normal.  Psychiatric: She has a normal mood and affect. Her behavior is normal. Judgment and  thought content normal.   Lab Results  Component Value Date   WBC 7.4 03/26/2013   HGB 15.0 03/26/2013   HCT 43.0 03/26/2013   PLT 213 03/26/2013   GLUCOSE 114* 03/26/2013   CHOL 204* 03/21/2013   TRIG 323.0* 03/21/2013   HDL 46.90 03/21/2013   LDLDIRECT 115.1 03/21/2013   LDLCALC 61 09/17/2009   ALT 20 03/21/2013   AST 24 03/21/2013   NA 139 03/26/2013   K 4.0 03/26/2013   CL 104 03/26/2013   CREATININE 1.01 03/26/2013   BUN 19 03/26/2013   CO2 22 03/26/2013   TSH 1.83 03/21/2013   INR 2.2 05/28/2013        Assessment & Plan:   AWV/v70.0 - Today patient counseled on age appropriate routine health concerns for screening and prevention, each reviewed and up to date or declined. Immunizations reviewed and up to date or declined. Labs/ECG reviewed. Risk factors for depression reviewed and negative. Hearing function and visual acuity are intact. ADLs screened and addressed as needed. Functional ability and level of safety reviewed and appropriate. Education, counseling and referrals performed based on assessed risks today. Patient provided with a copy of personalized plan for preventive services.  allergic rhinitis - recommended OTC antihistamine and nasal spray. Patient to call symptoms worse or unimproved  Also see problem list. Medications and labs reviewed today.  Addendum - patient reports occasional low back spasm. Pain is related to overuse and exertion. No numbness or weakness. Improved with muscle rub and heating pad but requests prescription muscle relaxant to use as needed. Low-dose Robaxin and sent to pharmacy. Patient call symptoms worse or unimproved

## 2013-05-31 ENCOUNTER — Other Ambulatory Visit: Payer: Self-pay | Admitting: Cardiovascular Disease

## 2013-06-08 ENCOUNTER — Other Ambulatory Visit: Payer: Self-pay | Admitting: Cardiovascular Disease

## 2013-06-10 ENCOUNTER — Ambulatory Visit: Payer: Medicare Other | Admitting: Cardiovascular Disease

## 2013-06-10 ENCOUNTER — Other Ambulatory Visit: Payer: Self-pay | Admitting: *Deleted

## 2013-06-10 MED ORDER — CLOPIDOGREL BISULFATE 75 MG PO TABS: 75.0000 mg | ORAL_TABLET | Freq: Every day | ORAL | Status: DC

## 2013-06-15 ENCOUNTER — Other Ambulatory Visit: Payer: Self-pay | Admitting: Cardiovascular Disease

## 2013-06-16 ENCOUNTER — Other Ambulatory Visit: Payer: Self-pay

## 2013-06-16 MED ORDER — ISOSORBIDE MONONITRATE ER 30 MG PO TB24
30.0000 mg | ORAL_TABLET | Freq: Every day | ORAL | Status: DC
Start: 2013-06-16 — End: 2013-06-17

## 2013-06-20 ENCOUNTER — Ambulatory Visit (INDEPENDENT_AMBULATORY_CARE_PROVIDER_SITE_OTHER): Payer: Medicare Other | Admitting: Cardiovascular Disease

## 2013-06-20 VITALS — BP 134/66 | HR 56 | Ht 64.0 in | Wt 202.8 lb

## 2013-06-20 DIAGNOSIS — I6529 Occlusion and stenosis of unspecified carotid artery: Secondary | ICD-10-CM

## 2013-06-20 DIAGNOSIS — E782 Mixed hyperlipidemia: Secondary | ICD-10-CM

## 2013-06-20 DIAGNOSIS — I4891 Unspecified atrial fibrillation: Secondary | ICD-10-CM

## 2013-06-20 DIAGNOSIS — R0989 Other specified symptoms and signs involving the circulatory and respiratory systems: Secondary | ICD-10-CM | POA: Diagnosis not present

## 2013-06-20 DIAGNOSIS — I2510 Atherosclerotic heart disease of native coronary artery without angina pectoris: Secondary | ICD-10-CM | POA: Diagnosis not present

## 2013-06-20 NOTE — Assessment & Plan Note (Signed)
Good rate control and anticoagulation F/U INR at [facility]

## 2013-06-20 NOTE — Progress Notes (Signed)
Patient ID: Judy [name], female   DOB: 07/22/1930, 80 y.o.   MRN: [id number] 80 year old female with prior history of coronary artery disease status post prior stenting of the right coronary artery in 1996 and subsequent  stenting of the PDA and OM in 2006.  Chronic afib on anticoagulation with rate controll  Seen in ER 10/14 for chest pain d/c home r/o  Since then, she has done quite well. She lives by herself in [place] and is quite active around her home without symptoms or limitations. She does have macular degeneration requires monthly injections of the left eye and every other monthly injections into the right eye. Thinks she got nervous about grandson's wedding in [facility]   June 2013  1-39% bilateral ICA stenosis       ROS: Denies fever, malais, weight loss, blurry vision, decreased visual acuity, cough, sputum, SOB, hemoptysis, pleuritic pain, palpitaitons, heartburn, abdominal pain, melena, lower extremity edema, claudication, or rash.  All other systems reviewed and negative  General: Affect appropriate Healthy:  appears stated age 35: normal Neck supple with no adenopathy JVP normal left  bruits no thyromegaly Lungs clear with no wheezing and good diaphragmatic motion Heart:  S1/S2 no murmur, no rub, gallop or click PMI normal Abdomen: benighn, BS positve, no tenderness, no AAA no bruit.  No HSM or HJR Distal pulses intact with no bruits No edema Neuro non-focal Skin warm and dry No muscular weakness   Current Outpatient Prescriptions  Medication Sig Dispense Refill  . aspirin 81 MG tablet Take 81 mg by mouth daily.        . cetirizine (ZYRTEC) 10 MG tablet Take 1 tablet (10 mg total) by mouth daily.  30 tablet  11  . clopidogrel (PLAVIX) 75 MG tablet Take 1 tablet (75 mg total) by mouth daily.  30 tablet  0  . diltiazem (CARDIZEM CD) 120 MG 24 hr capsule Take 120 mg by mouth at bedtime.      . fluticasone (FLONASE) 50 MCG/ACT nasal spray Place 1 spray into both  nostrils daily.  16 g  2  . furosemide (LASIX) 20 MG tablet Take 20 mg by mouth daily.      . isosorbide mononitrate (IMDUR) 30 MG 24 hr tablet TAKE ONE TABLET BY MOUTH ONE TIME DAILY. NEEDS AN OFFICE VISIT.  30 tablet  0  . lisinopril (PRINIVIL,ZESTRIL) 10 MG tablet Take 1 tablet (10 mg total) by mouth daily.  90 tablet  4  . methocarbamol (ROBAXIN) 500 MG tablet Take 1 tablet (500 mg total) by mouth every 8 (eight) hours as needed for muscle spasms.  30 tablet  1  . metoprolol succinate (TOPROL-XL) 100 MG 24 hr tablet Take 1 tablet (100 mg total) by mouth daily. Take with or immediately following a meal.  90 tablet  3  . Multiple Vitamin (MULTIVITAMIN WITH MINERALS) TABS tablet Take 1 tablet by mouth daily.      . nitroGLYCERIN (NITROSTAT) 0.4 MG SL tablet Place 1 tablet (0.4 mg total) under the tongue every 5 (five) minutes as needed for chest pain.  25 tablet  3  . potassium chloride (MICRO-K) 10 MEQ CR capsule Take 10 mEq by mouth daily.      . pravastatin (PRAVACHOL) 80 MG tablet Take 80 mg by mouth at bedtime.      Marland Kitchen tobramycin (TOBREX) 0.3 % ophthalmic solution Place 1 drop into the left eye See admin instructions. Use for 2 days prior to procedure and day of procedure.      Marland Kitchen  warfarin (COUMADIN) 2.5 MG tablet Take 2.5 mg by mouth every evening. Take 0.5 tablet on Tuesday and Take 1 tablet the rest of the week       No current facility-administered medications for this visit.    Allergies  Review of patient's allergies indicates no known allergies.  Electrocardiogram:  SR nonspecific ST/T wave changes   Assessment and Plan

## 2013-06-20 NOTE — Assessment & Plan Note (Signed)
F/U duplex ASA

## 2013-06-20 NOTE — Assessment & Plan Note (Signed)
Well controlled.  Continue current medications and low sodium Dash type diet.    

## 2013-06-20 NOTE — Assessment & Plan Note (Signed)
Cholesterol is at goal.  Continue current dose of statin and diet Rx.  No myalgias or side effects.  F/U  LFT's in 6 months. Lab Results  Component Value Date   LDLCALC 61 09/17/2009

## 2013-06-20 NOTE — Assessment & Plan Note (Signed)
Stable with no angina and good activity level.  Continue medical Rx No issues since ER visit  Stop Plaivix given need for anticoagulaiton Continue 81 mg ASA

## 2013-06-20 NOTE — Patient Instructions (Addendum)
Your physician wants you to follow-up in:   [place] will receive a reminder letter in the mail two months in advance. If you don't receive a letter, please call our office to schedule the follow-up appointment. Your physician has recommended you make the following change in your medication:   STOP   PLAVIX   Your physician has requested that you have a carotid duplex. This test is an ultrasound of the carotid arteries in your neck. It looks at blood flow through these arteries that supply the brain with blood. Allow one hour for this exam. There are no restrictions or special instructions.

## 2013-06-21 ENCOUNTER — Other Ambulatory Visit: Payer: Self-pay | Admitting: Internal Medicine

## 2013-06-23 ENCOUNTER — Telehealth: Payer: Self-pay | Admitting: *Deleted

## 2013-06-23 ENCOUNTER — Other Ambulatory Visit: Payer: Self-pay | Admitting: General Practice

## 2013-06-23 MED ORDER — METOPROLOL SUCCINATE ER 100 MG PO TB24: 100.0000 mg | ORAL_TABLET | Freq: Two times a day (BID) | ORAL | Status: DC

## 2013-06-23 MED ORDER — WARFARIN SODIUM 2.5 MG PO TABS: ORAL_TABLET | ORAL | Status: DC

## 2013-06-23 NOTE — Telephone Encounter (Signed)
Received fax stating per pt history & patient she suppose to be taking metoprolol XL 100 mg twice a day. Received script for once daily back in December. Please clarify sig...Judy [name]

## 2013-06-23 NOTE — Telephone Encounter (Signed)
Bid ok - new rx done

## 2013-06-23 NOTE — Telephone Encounter (Signed)
md sent updated script...Johny Chess

## 2013-06-25 ENCOUNTER — Other Ambulatory Visit: Payer: Self-pay | Admitting: Cardiovascular Disease

## 2013-06-30 ENCOUNTER — Other Ambulatory Visit: Payer: Self-pay | Admitting: Cardiovascular Disease

## 2013-07-01 DIAGNOSIS — H35379 Puckering of macula, unspecified eye: Secondary | ICD-10-CM | POA: Diagnosis not present

## 2013-07-01 DIAGNOSIS — H353290 Exudative age-related macular degeneration, unspecified eye, stage unspecified: Secondary | ICD-10-CM | POA: Diagnosis not present

## 2013-07-01 DIAGNOSIS — H35059 Retinal neovascularization, unspecified, unspecified eye: Secondary | ICD-10-CM | POA: Diagnosis not present

## 2013-07-01 DIAGNOSIS — H43819 Vitreous degeneration, unspecified eye: Secondary | ICD-10-CM | POA: Diagnosis not present

## 2013-07-11 ENCOUNTER — Other Ambulatory Visit: Payer: Self-pay | Admitting: Cardiovascular Disease

## 2013-07-25 ENCOUNTER — Other Ambulatory Visit: Payer: Self-pay | Admitting: Cardiovascular Disease

## 2013-07-25 ENCOUNTER — Ambulatory Visit (INDEPENDENT_AMBULATORY_CARE_PROVIDER_SITE_OTHER): Payer: Medicare Other | Admitting: General Practice

## 2013-07-25 DIAGNOSIS — I4891 Unspecified atrial fibrillation: Secondary | ICD-10-CM

## 2013-07-25 DIAGNOSIS — Z5181 Encounter for therapeutic drug level monitoring: Secondary | ICD-10-CM | POA: Diagnosis not present

## 2013-07-25 DIAGNOSIS — Z7901 Long term (current) use of anticoagulants: Secondary | ICD-10-CM

## 2013-07-25 LAB — POCT INR: INR: 2.1

## 2013-07-25 NOTE — Progress Notes (Signed)
Pre-visit discussion using our clinic review tool. No additional management support is needed unless otherwise documented below in the visit note.  

## 2013-08-06 DIAGNOSIS — H353290 Exudative age-related macular degeneration, unspecified eye, stage unspecified: Secondary | ICD-10-CM | POA: Diagnosis not present

## 2013-08-25 ENCOUNTER — Ambulatory Visit (HOSPITAL_COMMUNITY): Payer: Medicare Other | Attending: Internal Medicine | Admitting: Cardiology

## 2013-08-25 ENCOUNTER — Encounter: Payer: Self-pay | Admitting: Internal Medicine

## 2013-08-25 DIAGNOSIS — R0989 Other specified symptoms and signs involving the circulatory and respiratory systems: Secondary | ICD-10-CM | POA: Diagnosis not present

## 2013-08-25 DIAGNOSIS — I6529 Occlusion and stenosis of unspecified carotid artery: Secondary | ICD-10-CM | POA: Diagnosis not present

## 2013-08-25 NOTE — Progress Notes (Signed)
Carotid duplex complete 

## 2013-09-03 DIAGNOSIS — H353290 Exudative age-related macular degeneration, unspecified eye, stage unspecified: Secondary | ICD-10-CM | POA: Diagnosis not present

## 2013-09-05 ENCOUNTER — Ambulatory Visit (INDEPENDENT_AMBULATORY_CARE_PROVIDER_SITE_OTHER): Payer: Medicare Other | Admitting: General Practice

## 2013-09-05 DIAGNOSIS — Z5181 Encounter for therapeutic drug level monitoring: Secondary | ICD-10-CM | POA: Diagnosis not present

## 2013-09-05 DIAGNOSIS — I4891 Unspecified atrial fibrillation: Secondary | ICD-10-CM

## 2013-09-05 DIAGNOSIS — Z7901 Long term (current) use of anticoagulants: Secondary | ICD-10-CM

## 2013-09-05 LAB — POCT INR: INR: 1.7

## 2013-09-05 NOTE — Progress Notes (Signed)
Pre visit review using our clinic review tool, if applicable. No additional management support is needed unless otherwise documented below in the visit note. 

## 2013-09-12 ENCOUNTER — Other Ambulatory Visit: Payer: Self-pay | Admitting: Cardiovascular Disease

## 2013-10-01 DIAGNOSIS — H353290 Exudative age-related macular degeneration, unspecified eye, stage unspecified: Secondary | ICD-10-CM | POA: Diagnosis not present

## 2013-10-01 DIAGNOSIS — H35059 Retinal neovascularization, unspecified, unspecified eye: Secondary | ICD-10-CM | POA: Diagnosis not present

## 2013-10-03 ENCOUNTER — Ambulatory Visit (INDEPENDENT_AMBULATORY_CARE_PROVIDER_SITE_OTHER): Payer: Medicare Other | Admitting: General Practice

## 2013-10-03 DIAGNOSIS — Z7901 Long term (current) use of anticoagulants: Secondary | ICD-10-CM | POA: Diagnosis not present

## 2013-10-03 DIAGNOSIS — I4891 Unspecified atrial fibrillation: Secondary | ICD-10-CM | POA: Diagnosis not present

## 2013-10-03 DIAGNOSIS — Z5181 Encounter for therapeutic drug level monitoring: Secondary | ICD-10-CM | POA: Diagnosis not present

## 2013-10-03 LAB — POCT INR: INR: 2.3

## 2013-10-03 NOTE — Progress Notes (Signed)
Pre visit review using our clinic review tool, if applicable. No additional management support is needed unless otherwise documented below in the visit note. 

## 2013-10-29 DIAGNOSIS — H35059 Retinal neovascularization, unspecified, unspecified eye: Secondary | ICD-10-CM | POA: Diagnosis not present

## 2013-10-29 DIAGNOSIS — H353290 Exudative age-related macular degeneration, unspecified eye, stage unspecified: Secondary | ICD-10-CM | POA: Diagnosis not present

## 2013-11-14 ENCOUNTER — Ambulatory Visit (INDEPENDENT_AMBULATORY_CARE_PROVIDER_SITE_OTHER): Payer: Medicare Other | Admitting: General Practice

## 2013-11-14 DIAGNOSIS — Z5181 Encounter for therapeutic drug level monitoring: Secondary | ICD-10-CM | POA: Diagnosis not present

## 2013-11-14 DIAGNOSIS — I4891 Unspecified atrial fibrillation: Secondary | ICD-10-CM

## 2013-11-14 DIAGNOSIS — Z7901 Long term (current) use of anticoagulants: Secondary | ICD-10-CM

## 2013-11-14 LAB — POCT INR: INR: 2.1

## 2013-11-14 NOTE — Progress Notes (Signed)
Pre visit review using our clinic review tool, if applicable. No additional management support is needed unless otherwise documented below in the visit note. 

## 2013-11-20 ENCOUNTER — Other Ambulatory Visit: Payer: Self-pay | Admitting: Internal Medicine

## 2013-11-21 ENCOUNTER — Other Ambulatory Visit: Payer: Self-pay | Admitting: General Practice

## 2013-11-21 MED ORDER — WARFARIN SODIUM 2.5 MG PO TABS: ORAL_TABLET | ORAL | Status: DC

## 2013-12-01 DIAGNOSIS — H353290 Exudative age-related macular degeneration, unspecified eye, stage unspecified: Secondary | ICD-10-CM | POA: Diagnosis not present

## 2013-12-01 DIAGNOSIS — H35059 Retinal neovascularization, unspecified, unspecified eye: Secondary | ICD-10-CM | POA: Diagnosis not present

## 2013-12-19 ENCOUNTER — Other Ambulatory Visit: Payer: Self-pay | Admitting: Internal Medicine

## 2013-12-25 ENCOUNTER — Other Ambulatory Visit: Payer: Self-pay | Admitting: Cardiovascular Disease

## 2013-12-26 ENCOUNTER — Ambulatory Visit (INDEPENDENT_AMBULATORY_CARE_PROVIDER_SITE_OTHER): Payer: Medicare Other | Admitting: General Practice

## 2013-12-26 DIAGNOSIS — Z7901 Long term (current) use of anticoagulants: Secondary | ICD-10-CM | POA: Diagnosis not present

## 2013-12-26 DIAGNOSIS — Z5181 Encounter for therapeutic drug level monitoring: Secondary | ICD-10-CM | POA: Diagnosis not present

## 2013-12-26 DIAGNOSIS — I4891 Unspecified atrial fibrillation: Secondary | ICD-10-CM

## 2013-12-26 LAB — POCT INR: INR: 3.1

## 2013-12-26 NOTE — Progress Notes (Signed)
Pre visit review using our clinic review tool, if applicable. No additional management support is needed unless otherwise documented below in the visit note. 

## 2014-01-01 ENCOUNTER — Other Ambulatory Visit: Payer: Self-pay | Admitting: Cardiovascular Disease

## 2014-01-02 ENCOUNTER — Other Ambulatory Visit: Payer: Self-pay

## 2014-01-02 MED ORDER — FUROSEMIDE 20 MG PO TABS: ORAL_TABLET | ORAL | Status: DC

## 2014-01-02 MED ORDER — PRAVASTATIN SODIUM 80 MG PO TABS: 80.0000 mg | ORAL_TABLET | Freq: Every day | ORAL | Status: DC

## 2014-01-21 ENCOUNTER — Ambulatory Visit (INDEPENDENT_AMBULATORY_CARE_PROVIDER_SITE_OTHER): Payer: Medicare Other | Admitting: *Deleted

## 2014-01-21 DIAGNOSIS — I4891 Unspecified atrial fibrillation: Secondary | ICD-10-CM | POA: Diagnosis not present

## 2014-01-21 DIAGNOSIS — Z5181 Encounter for therapeutic drug level monitoring: Secondary | ICD-10-CM | POA: Diagnosis not present

## 2014-01-21 DIAGNOSIS — Z7901 Long term (current) use of anticoagulants: Secondary | ICD-10-CM | POA: Diagnosis not present

## 2014-01-21 LAB — POCT INR: INR: 2.6

## 2014-01-22 ENCOUNTER — Other Ambulatory Visit: Payer: Self-pay | Admitting: *Deleted

## 2014-01-22 MED ORDER — LISINOPRIL 10 MG PO TABS: ORAL_TABLET | ORAL | Status: DC

## 2014-01-22 MED ORDER — DILTIAZEM HCL ER COATED BEADS 120 MG PO CP24: 120.0000 mg | ORAL_CAPSULE | Freq: Every day | ORAL | Status: DC

## 2014-01-26 DIAGNOSIS — H05229 Edema of unspecified orbit: Secondary | ICD-10-CM | POA: Diagnosis not present

## 2014-01-26 DIAGNOSIS — H02119 Cicatricial ectropion of unspecified eye, unspecified eyelid: Secondary | ICD-10-CM | POA: Diagnosis not present

## 2014-01-26 DIAGNOSIS — H04529 Eversion of unspecified lacrimal punctum: Secondary | ICD-10-CM | POA: Diagnosis not present

## 2014-01-26 DIAGNOSIS — H02139 Senile ectropion of unspecified eye, unspecified eyelid: Secondary | ICD-10-CM | POA: Diagnosis not present

## 2014-01-28 DIAGNOSIS — H353290 Exudative age-related macular degeneration, unspecified eye, stage unspecified: Secondary | ICD-10-CM | POA: Diagnosis not present

## 2014-01-28 DIAGNOSIS — H35059 Retinal neovascularization, unspecified, unspecified eye: Secondary | ICD-10-CM | POA: Diagnosis not present

## 2014-02-18 ENCOUNTER — Ambulatory Visit (INDEPENDENT_AMBULATORY_CARE_PROVIDER_SITE_OTHER): Payer: Medicare Other | Admitting: *Deleted

## 2014-02-18 DIAGNOSIS — Z5181 Encounter for therapeutic drug level monitoring: Secondary | ICD-10-CM

## 2014-02-18 DIAGNOSIS — I4891 Unspecified atrial fibrillation: Secondary | ICD-10-CM | POA: Diagnosis not present

## 2014-02-18 DIAGNOSIS — Z7901 Long term (current) use of anticoagulants: Secondary | ICD-10-CM

## 2014-02-18 LAB — POCT INR: INR: 2.2

## 2014-02-20 ENCOUNTER — Encounter: Payer: Self-pay | Admitting: Family Medicine

## 2014-02-20 ENCOUNTER — Ambulatory Visit (INDEPENDENT_AMBULATORY_CARE_PROVIDER_SITE_OTHER): Payer: Medicare Other | Admitting: Family Medicine

## 2014-02-20 VITALS — BP 134/78 | HR 80 | Temp 98.0°F | Wt 200.0 lb

## 2014-02-20 DIAGNOSIS — I2510 Atherosclerotic heart disease of native coronary artery without angina pectoris: Secondary | ICD-10-CM

## 2014-02-20 DIAGNOSIS — J309 Allergic rhinitis, unspecified: Secondary | ICD-10-CM

## 2014-02-20 DIAGNOSIS — J3089 Other allergic rhinitis: Secondary | ICD-10-CM

## 2014-02-20 MED ORDER — AZELASTINE HCL 0.1 % NA SOLN: NASAL | Status: DC

## 2014-02-20 NOTE — Progress Notes (Signed)
   Subjective:    Patient ID: Judy [name], female    DOB: [birth year], 80 y.o.   MRN: [id number]  Sinusitis Associated symptoms include congestion, coughing and sinus pressure. Pertinent negatives include no chills, headaches, shortness of breath or sore throat.   Acute visit. Patient seen with 4 day history of sinus congestion. She has history of perennial allergies to dust. She's been taking some Zyrtec and Flonase without much relief. She has clear discharge. Frequently has watery and itchy eyes. No matting. No headaches. No fevers or chills. Occasional dry cough.  Her chronic problems are reviewed. She has history of atrial fibrillation on Coumadin, hypertension, CAD, hyperlipidemia, macular degeneration  Past Medical History  Diagnosis Date  . CAD (coronary artery disease)     a. BMS to RCA 96';  b. Stent PDA and OM DES 2006  . Atrial fibrillation     a. chronic anticoag, failed prior DCCV;  b. 03/2010 Echo: EF 55-65%, mod MR, mildly to mod dil LA, mod dil RA, mild to mod MR.  Marland Kitchen HTN (hypertension)   . Hyperlipidemia   . Obesity   . CAROTID ARTERY DISEASE     a. 11/2011 Carotid U/S:  0-39% bilat dzs.  Marland Kitchen LBBB (left bundle branch block)   . Macular degeneration, wet 10/2012 dx    L>R, follows with retinal specialist->injections monthly.   Past Surgical History  Procedure Laterality Date  . Coronary stent placement  1996, 2006 x2    Drug-eluting stent placement to the PDA, drug-eluting stent  placement to the first obtuse  marginal, StarClose closure of the right common femoral arteriotomy site. Successful drug-eluting stent  placement in  both the posterior descending artery and the obtuse marginal. The lesion was directly stented using a 2.5 x 16 mm Taxus deployed at 14 atmospheres.     reports that she has never smoked. She does not have any smokeless tobacco history on file. She reports that she does not drink alcohol or use illicit drugs. family history includes Arthritis in her  father and mother; CAD in her brother, brother, and sister. No Known Allergies    Review of Systems  Constitutional: Negative for fever and chills.  HENT: Positive for congestion, postnasal drip, rhinorrhea and sinus pressure. Negative for ear discharge, facial swelling, sore throat and voice change.   Eyes: Negative for visual disturbance.  Respiratory: Positive for cough. Negative for shortness of breath and wheezing.   Cardiovascular: Negative for chest pain.  Neurological: Negative for headaches.       Objective:   Physical Exam  Constitutional: She appears well-developed and well-nourished.  HENT:  Right Ear: External ear normal.  Left Ear: External ear normal.  Mouth/Throat: Oropharynx is clear and moist.  Clear mucous both nares  Neck: Neck supple.  Cardiovascular: Normal rate.   Irregularly irregular rhythm  Pulmonary/Chest: Effort normal and breath sounds normal. No respiratory distress. She has no wheezes. She has no rales.          Assessment & Plan:  Rhinitis. Suspect allergic. Add Astelin one to 2 sprays per nostril twice daily. Followup promptly for signs of secondary infection

## 2014-02-20 NOTE — Progress Notes (Signed)
Pre visit review using our clinic review tool, if applicable. No additional management support is needed unless otherwise documented below in the visit note. 

## 2014-02-20 NOTE — Patient Instructions (Signed)

## 2014-03-09 ENCOUNTER — Encounter: Payer: Self-pay | Admitting: Cardiovascular Disease

## 2014-03-09 ENCOUNTER — Ambulatory Visit (INDEPENDENT_AMBULATORY_CARE_PROVIDER_SITE_OTHER): Payer: Medicare Other | Admitting: Cardiovascular Disease

## 2014-03-09 VITALS — BP 126/66 | HR 72 | Ht 64.0 in | Wt 200.1 lb

## 2014-03-09 DIAGNOSIS — I4891 Unspecified atrial fibrillation: Secondary | ICD-10-CM | POA: Diagnosis not present

## 2014-03-09 DIAGNOSIS — I6529 Occlusion and stenosis of unspecified carotid artery: Secondary | ICD-10-CM

## 2014-03-09 DIAGNOSIS — I10 Essential (primary) hypertension: Secondary | ICD-10-CM | POA: Diagnosis not present

## 2014-03-09 DIAGNOSIS — I2510 Atherosclerotic heart disease of native coronary artery without angina pectoris: Secondary | ICD-10-CM

## 2014-03-09 DIAGNOSIS — I482 Chronic atrial fibrillation, unspecified: Secondary | ICD-10-CM

## 2014-03-09 DIAGNOSIS — E782 Mixed hyperlipidemia: Secondary | ICD-10-CM

## 2014-03-09 NOTE — Progress Notes (Signed)
Patient ID: Judy [name], female   DOB: 06-12-1931, 80 y.o.   MRN: [id number] 80 year old female with prior history of coronary artery disease status post prior stenting of the right coronary artery in 1996 and subsequent stenting of the PDA and OM in 2006. Chronic afib on anticoagulation with rate controll Seen in ER 10/14 for chest pain d/c home r/o Since then, she has done quite well. She lives by herself in [place] and is quite active around her home without symptoms or limitations. She does have macular degeneration requires monthly injections of the left eye and every other monthly injections into the right eye.   June 2013 1-39% bilateral ICA stenosis   Needs to have some eye lid surgery No need to bridge coumadin Told her to stop 4 days before surgery.     ROS: Denies fever, malais, weight loss, blurry vision, decreased visual acuity, cough, sputum, SOB, hemoptysis, pleuritic pain, palpitaitons, heartburn, abdominal pain, melena, lower extremity edema, claudication, or rash.  All other systems reviewed and negative  General: Affect appropriate Healthy:  appears stated age 79: normal Neck supple with no adenopathy JVP normal no bruits no thyromegaly Lungs clear with no wheezing and good diaphragmatic motion Heart:  S1/S2 no murmur, no rub, gallop or click PMI normal Abdomen: benighn, BS positve, no tenderness, no AAA no bruit.  No HSM or HJR Distal pulses intact with no bruits No edema Neuro non-focal Skin warm and dry No muscular weakness   Current Outpatient Prescriptions  Medication Sig Dispense Refill  . aspirin 81 MG tablet Take 81 mg by mouth daily.        Marland Kitchen azelastine (ASTELIN) 0.1 % nasal spray Use in each nostril as directed  30 mL  6  . cetirizine (ZYRTEC) 10 MG tablet Take 1 tablet (10 mg total) by mouth daily.  30 tablet  11  . diltiazem (CARDIZEM CD) 120 MG 24 hr capsule Take 1 capsule (120 mg total) by mouth at bedtime.  90 capsule  0  . fluticasone  (FLONASE) 50 MCG/ACT nasal spray USE ONE SPRAY IN EACH NOSTRIL ONE TIME DAILY   16 g  1  . furosemide (LASIX) 20 MG tablet Take one tablet by mouth one time daily  90 tablet  0  . isosorbide mononitrate (IMDUR) 30 MG 24 hr tablet TAKE ONE TABLET BY MOUTH ONE TIME DAILY. NEEDS AN OFFICE VISIT.  30 tablet  0  . isosorbide mononitrate (IMDUR) 30 MG 24 hr tablet TAKE ONE TABLET BY MOUTH NIGHTLY AT BEDTIME   30 tablet  5  . lisinopril (PRINIVIL,ZESTRIL) 10 MG tablet Take one tablet by mouth one time daily  90 tablet  0  . methocarbamol (ROBAXIN) 500 MG tablet Take 1 tablet (500 mg total) by mouth every 8 (eight) hours as needed for muscle spasms.  30 tablet  1  . metoprolol succinate (TOPROL-XL) 100 MG 24 hr tablet Take 1 tablet (100 mg total) by mouth 2 (two) times daily. Take with or immediately following a meal.  180 tablet  3  . Multiple Vitamin (MULTIVITAMIN WITH MINERALS) TABS tablet Take 1 tablet by mouth daily.      . nitroGLYCERIN (NITROSTAT) 0.4 MG SL tablet Place 1 tablet (0.4 mg total) under the tongue every 5 (five) minutes as needed for chest pain.  25 tablet  3  . potassium chloride (MICRO-K) 10 MEQ CR capsule TAKE ONE CAPSULE BY MOUTH ONE TIME DAILY   90 capsule  0  . pravastatin (PRAVACHOL)  80 MG tablet Take 1 tablet (80 mg total) by mouth at bedtime.  90 tablet  0  . tobramycin (TOBREX) 0.3 % ophthalmic solution Place 1 drop into the left eye See admin instructions. Use for 2 days prior to procedure and day of procedure.      . warfarin (COUMADIN) 2.5 MG tablet Take as directed by anticoagulation clinic  35 tablet  3   No current facility-administered medications for this visit.    Allergies  Review of patient's allergies indicates no known allergies.  Electrocardiogram:  afib low voltage non specific ST chages  10/14  Today afib rate 72 ? Old IMI   Assessment and Plan

## 2014-03-09 NOTE — Assessment & Plan Note (Signed)
Well controlled.  Continue current medications and low sodium Dash type diet.    

## 2014-03-09 NOTE — Assessment & Plan Note (Signed)
Chronic good rate control and anticoagulation No need for lovenox bridge

## 2014-03-09 NOTE — Assessment & Plan Note (Signed)
79-39% LICA stenosis.  F/U duplex 3/16  ASA

## 2014-03-09 NOTE — Assessment & Plan Note (Signed)
Stable with no angina and good activity level.  Continue medical Rx Ok to have eye surgery this month

## 2014-03-09 NOTE — Patient Instructions (Signed)
Your physician wants you to follow-up in:  6 MONTHS WITH DR NISHAN  You will receive a reminder letter in the mail two months in advance. If you don't receive a letter, please call our office to schedule the follow-up appointment. Your physician recommends that you continue on your current medications as directed. Please refer to the Current Medication list given to you today. 

## 2014-03-09 NOTE — Assessment & Plan Note (Signed)
Cholesterol is at goal.  Continue current dose of statin and diet Rx.  No myalgias or side effects.  F/U  LFT's in 6 months. Lab Results  Component Value Date   LDLCALC 61 09/17/2009   Labs with primary

## 2014-03-12 ENCOUNTER — Other Ambulatory Visit: Payer: Self-pay | Admitting: Internal Medicine

## 2014-03-14 ENCOUNTER — Other Ambulatory Visit: Payer: Self-pay | Admitting: Cardiovascular Disease

## 2014-03-25 ENCOUNTER — Ambulatory Visit (INDEPENDENT_AMBULATORY_CARE_PROVIDER_SITE_OTHER): Payer: Medicare Other | Admitting: *Deleted

## 2014-03-25 DIAGNOSIS — Z7901 Long term (current) use of anticoagulants: Secondary | ICD-10-CM

## 2014-03-25 DIAGNOSIS — I482 Chronic atrial fibrillation, unspecified: Secondary | ICD-10-CM

## 2014-03-25 DIAGNOSIS — I4891 Unspecified atrial fibrillation: Secondary | ICD-10-CM

## 2014-03-25 DIAGNOSIS — Z5181 Encounter for therapeutic drug level monitoring: Secondary | ICD-10-CM

## 2014-03-25 LAB — POCT INR: INR: 2.1

## 2014-03-26 ENCOUNTER — Other Ambulatory Visit: Payer: Self-pay | Admitting: Cardiovascular Disease

## 2014-03-31 DIAGNOSIS — H3532 Exudative age-related macular degeneration: Secondary | ICD-10-CM | POA: Diagnosis not present

## 2014-03-31 DIAGNOSIS — H35372 Puckering of macula, left eye: Secondary | ICD-10-CM | POA: Diagnosis not present

## 2014-03-31 DIAGNOSIS — H35053 Retinal neovascularization, unspecified, bilateral: Secondary | ICD-10-CM | POA: Diagnosis not present

## 2014-04-15 ENCOUNTER — Telehealth: Payer: Self-pay | Admitting: Cardiovascular Disease

## 2014-04-15 NOTE — Telephone Encounter (Signed)
Judy [name] was informed that I will forward Dr Kyla Balzarine last [place]. Instructions were included. Taken to MR to be faxed.

## 2014-04-15 NOTE — Telephone Encounter (Signed)
New message    patient having surgery on  11/9 . Need to stop plavix, coumadin baby asa - need to stop today .

## 2014-04-20 ENCOUNTER — Other Ambulatory Visit: Payer: Self-pay | Admitting: Cardiovascular Disease

## 2014-04-20 DIAGNOSIS — H0289 Other specified disorders of eyelid: Secondary | ICD-10-CM | POA: Diagnosis not present

## 2014-04-20 DIAGNOSIS — H05222 Edema of left orbit: Secondary | ICD-10-CM | POA: Diagnosis not present

## 2014-04-20 DIAGNOSIS — H02135 Senile ectropion of left lower eyelid: Secondary | ICD-10-CM | POA: Diagnosis not present

## 2014-04-20 DIAGNOSIS — H02115 Cicatricial ectropion of left lower eyelid: Secondary | ICD-10-CM | POA: Diagnosis not present

## 2014-04-20 DIAGNOSIS — H04522 Eversion of left lacrimal punctum: Secondary | ICD-10-CM | POA: Diagnosis not present

## 2014-04-20 DIAGNOSIS — H02134 Senile ectropion of left upper eyelid: Secondary | ICD-10-CM | POA: Diagnosis not present

## 2014-04-20 DIAGNOSIS — H02104 Unspecified ectropion of left upper eyelid: Secondary | ICD-10-CM | POA: Diagnosis not present

## 2014-04-20 DIAGNOSIS — H04562 Stenosis of left lacrimal punctum: Secondary | ICD-10-CM | POA: Diagnosis not present

## 2014-04-29 ENCOUNTER — Ambulatory Visit (INDEPENDENT_AMBULATORY_CARE_PROVIDER_SITE_OTHER): Payer: Medicare Other

## 2014-04-29 ENCOUNTER — Telehealth: Payer: Self-pay | Admitting: Family

## 2014-04-29 DIAGNOSIS — I482 Chronic atrial fibrillation, unspecified: Secondary | ICD-10-CM

## 2014-04-29 DIAGNOSIS — Z5181 Encounter for therapeutic drug level monitoring: Secondary | ICD-10-CM | POA: Diagnosis not present

## 2014-04-29 LAB — POCT INR: INR: 1.4

## 2014-04-29 NOTE — Telephone Encounter (Signed)
Agree with plan 

## 2014-05-13 ENCOUNTER — Ambulatory Visit (INDEPENDENT_AMBULATORY_CARE_PROVIDER_SITE_OTHER): Payer: Medicare Other

## 2014-05-13 DIAGNOSIS — I482 Chronic atrial fibrillation, unspecified: Secondary | ICD-10-CM

## 2014-05-13 DIAGNOSIS — Z5181 Encounter for therapeutic drug level monitoring: Secondary | ICD-10-CM | POA: Diagnosis not present

## 2014-05-13 DIAGNOSIS — Z23 Encounter for immunization: Secondary | ICD-10-CM | POA: Diagnosis not present

## 2014-05-13 LAB — POCT INR: INR: 2.3

## 2014-05-26 DIAGNOSIS — H35372 Puckering of macula, left eye: Secondary | ICD-10-CM | POA: Diagnosis not present

## 2014-05-26 DIAGNOSIS — H3532 Exudative age-related macular degeneration: Secondary | ICD-10-CM | POA: Diagnosis not present

## 2014-06-10 ENCOUNTER — Ambulatory Visit (INDEPENDENT_AMBULATORY_CARE_PROVIDER_SITE_OTHER): Payer: Medicare Other | Admitting: Family Medicine

## 2014-06-10 DIAGNOSIS — I482 Chronic atrial fibrillation, unspecified: Secondary | ICD-10-CM

## 2014-06-10 DIAGNOSIS — Z5181 Encounter for therapeutic drug level monitoring: Secondary | ICD-10-CM | POA: Diagnosis not present

## 2014-06-10 LAB — POCT INR: INR: 2.7

## 2014-06-16 ENCOUNTER — Encounter: Payer: Medicare Other | Admitting: Internal Medicine

## 2014-07-06 ENCOUNTER — Other Ambulatory Visit: Payer: Self-pay | Admitting: Internal Medicine

## 2014-07-06 DIAGNOSIS — H04221 Epiphora due to insufficient drainage, right lacrimal gland: Secondary | ICD-10-CM | POA: Diagnosis not present

## 2014-07-06 DIAGNOSIS — H04223 Epiphora due to insufficient drainage, bilateral lacrimal glands: Secondary | ICD-10-CM | POA: Diagnosis not present

## 2014-07-06 DIAGNOSIS — H04551 Acquired stenosis of right nasolacrimal duct: Secondary | ICD-10-CM | POA: Diagnosis not present

## 2014-07-08 ENCOUNTER — Ambulatory Visit (INDEPENDENT_AMBULATORY_CARE_PROVIDER_SITE_OTHER): Payer: Medicare Other

## 2014-07-08 DIAGNOSIS — Z5181 Encounter for therapeutic drug level monitoring: Secondary | ICD-10-CM

## 2014-07-08 LAB — POCT INR: INR: 2.4

## 2014-08-04 ENCOUNTER — Telehealth: Payer: Self-pay | Admitting: Cardiovascular Disease

## 2014-08-04 NOTE — Telephone Encounter (Signed)
New message      Request for surgical clearance:  1. What type of surgery is being performed? Tear gland surgery   2. When is this surgery scheduled? 08-10-14   3. Are there any medications that need to be held prior to surgery and how long? warfarin  4. Name of physician performing surgery? Dr Darron Doom  5. What is your office phone and fax number? [phone]

## 2014-08-04 NOTE — Telephone Encounter (Signed)
PT  CALLING  NEEDING TO  KNOW   IF MAY PROCEED  WITH TEAR  GLAND  SURGERY SCHEDULED  FOR   08-10-14 , IF SO   NEEDS  TO KNOW  IF MAY HOLD  COUMADIN .Adonis Housekeeper

## 2014-08-04 NOTE — Telephone Encounter (Signed)
See my last note ok to have surgery hold coumadin 4 days before surgery

## 2014-08-05 DIAGNOSIS — H35372 Puckering of macula, left eye: Secondary | ICD-10-CM | POA: Diagnosis not present

## 2014-08-05 DIAGNOSIS — H35053 Retinal neovascularization, unspecified, bilateral: Secondary | ICD-10-CM | POA: Diagnosis not present

## 2014-08-05 DIAGNOSIS — H3532 Exudative age-related macular degeneration: Secondary | ICD-10-CM | POA: Diagnosis not present

## 2014-08-05 NOTE — Telephone Encounter (Signed)
PT  NOTIFIED ./CY 

## 2014-08-05 NOTE — Telephone Encounter (Signed)
Follow up  ° ° ° °Returning call back to nurse  °

## 2014-08-05 NOTE — Telephone Encounter (Signed)
FAXED  THIS   MESSAGE TO  SURGEONS' OFFICE./CY  LM  MESSAGE  FOR PT  TO CALL  BACK  RE  DR'S  RESPONSE .Judy [name]

## 2014-08-10 ENCOUNTER — Other Ambulatory Visit: Payer: Self-pay | Admitting: Cardiovascular Disease

## 2014-08-10 DIAGNOSIS — J322 Chronic ethmoidal sinusitis: Secondary | ICD-10-CM | POA: Diagnosis not present

## 2014-08-10 DIAGNOSIS — H04221 Epiphora due to insufficient drainage, right lacrimal gland: Secondary | ICD-10-CM | POA: Diagnosis not present

## 2014-08-10 DIAGNOSIS — H04551 Acquired stenosis of right nasolacrimal duct: Secondary | ICD-10-CM | POA: Diagnosis not present

## 2014-08-10 DIAGNOSIS — H04021 Chronic dacryoadenitis, right lacrimal gland: Secondary | ICD-10-CM | POA: Diagnosis not present

## 2014-08-17 NOTE — Progress Notes (Signed)
Patient ID: Judy [name], female   DOB: August 25, 1930, 81 y.o.   MRN: [id number]    79 y.o.  81 y.o.  female with prior history of coronary artery disease status post prior stenting of the right coronary artery in 1996 and subsequent stenting of the PDA and OM in 2006. Chronic afib on anticoagulation with rate controll Seen in ER 10/14 for chest pain d/c home r/o Since then, she has done quite well. She lives by herself in [place] and is quite active around her home without symptoms or limitations. She does have macular degeneration requires monthly injections of the left eye and every other monthly injections into the right eye.   02/08/92  LICA 79-69% stenosis Overdue for f/u duplex  Problems with macular degeneration and tear duct on right eye Held coumadin a week ago for stent   ROS: Denies fever, malais, weight loss, blurry vision, decreased visual acuity, cough, sputum, SOB, hemoptysis, pleuritic pain, palpitaitons, heartburn, abdominal pain, melena, lower extremity edema, claudication, or rash.  All other systems reviewed and negative  General: Affect appropriate Healthy:  appears stated age 81: normal Neck supple with no adenopathy JVP normal no bruits no thyromegaly Lungs clear with no wheezing and good diaphragmatic motion Heart:  S1/S2 no murmur, no rub, gallop or click PMI normal Abdomen: benighn, BS positve, no tenderness, no AAA no bruit.  No HSM or HJR Distal pulses intact with no bruits No edema Neuro non-focal Skin warm and dry No muscular weakness   Current Outpatient Prescriptions  Medication Sig Dispense Refill  . aspirin 81 MG tablet Take 81 mg by mouth daily.      Marland Kitchen azelastine (ASTELIN) 0.1 % nasal spray Use in each nostril as directed 30 mL 6  . CARTIA XT 120 MG 24 hr capsule TAKE ONE CAPSULE BY MOUTH NIGHTLY AT BEDTIME  90 capsule 1  . Fexofenadine HCl (ALLEGRA ALLERGY PO) Take by mouth daily.    . fluticasone (FLONASE) 50 MCG/ACT nasal spray Place 1 spray into the  nose daily. RIGHT  NARE    . furosemide (LASIX) 20 MG tablet TAKE ONE TABLET BY MOUTH ONE TIME DAILY  90 tablet 1  . isosorbide mononitrate (IMDUR) 30 MG 24 hr tablet TAKE ONE TABLET BY MOUTH NIGHTLY AT BEDTIME  30 tablet 1  . lisinopril (PRINIVIL,ZESTRIL) 10 MG tablet TAKE ONE TABLET BY MOUTH ONE TIME DAILY  90 tablet 1  . methocarbamol (ROBAXIN) 500 MG tablet Take 1 tablet (500 mg total) by mouth every 8 (eight) hours as needed for muscle spasms. 30 tablet 1  . metoprolol succinate (TOPROL-XL) 100 MG 24 hr tablet Take 1 tablet (100 mg total) by mouth daily. 90 tablet 1  . Multiple Vitamin (MULTIVITAMIN WITH MINERALS) TABS tablet Take 1 tablet by mouth daily.    . nitroGLYCERIN (NITROSTAT) 0.4 MG SL tablet Place 1 tablet (0.4 mg total) under the tongue every 5 (five) minutes as needed for chest pain. 25 tablet 3  . potassium chloride (MICRO-K) 10 MEQ CR capsule TAKE ONE CAPSULE BY MOUTH ONE TIME DAILY  90 capsule 1  . pravastatin (PRAVACHOL) 80 MG tablet TAKE ONE TABLET BY MOUTH AT BEDTIME  90 tablet 1  . tobramycin (TOBREX) 0.3 % ophthalmic solution Place 1 drop into the left eye See admin instructions. Use for 2 days prior to procedure and day of procedure.    . warfarin (COUMADIN) 2.5 MG tablet Take as directed by anticoagulation clinic. 35 tablet 2   No current facility-administered medications for this  visit.    Allergies  Review of patient's allergies indicates no known allergies.  Electrocardiogram:  afib low voltage non specific ST chages  10/14  03/09/14  afib rate 72 ? Old IMI   Assessment and Plan

## 2014-08-18 ENCOUNTER — Ambulatory Visit (INDEPENDENT_AMBULATORY_CARE_PROVIDER_SITE_OTHER): Payer: Medicare Other | Admitting: Cardiovascular Disease

## 2014-08-18 VITALS — BP 142/76 | HR 83 | Ht 64.0 in | Wt 201.8 lb

## 2014-08-18 DIAGNOSIS — I2510 Atherosclerotic heart disease of native coronary artery without angina pectoris: Secondary | ICD-10-CM

## 2014-08-18 DIAGNOSIS — I6523 Occlusion and stenosis of bilateral carotid arteries: Secondary | ICD-10-CM

## 2014-08-18 DIAGNOSIS — I482 Chronic atrial fibrillation, unspecified: Secondary | ICD-10-CM

## 2014-08-18 DIAGNOSIS — H3532 Exudative age-related macular degeneration: Secondary | ICD-10-CM

## 2014-08-18 DIAGNOSIS — I10 Essential (primary) hypertension: Secondary | ICD-10-CM

## 2014-08-18 DIAGNOSIS — E782 Mixed hyperlipidemia: Secondary | ICD-10-CM

## 2014-08-18 DIAGNOSIS — H353290 Exudative age-related macular degeneration, unspecified eye, stage unspecified: Secondary | ICD-10-CM

## 2014-08-18 NOTE — Patient Instructions (Addendum)
Your physician wants you to follow-up in:    6 MONTHS WITH DR  NISHAN  You will receive a reminder letter in the mail two months in advance. If you don't receive a letter, please call our office to schedule the follow-up appointment.  Your physician recommends that you continue on your current medications as directed. Please refer to the Current Medication list given to you today.   Your physician has requested that you have a carotid duplex. This test is an ultrasound of the carotid arteries in your neck. It looks at blood flow through these arteries that supply the brain with blood. Allow one hour for this exam. There are no restrictions or special instructions.   

## 2014-08-18 NOTE — Assessment & Plan Note (Signed)
Getting shots.  Left eye worse but tear duct on right just stented f/u optho  Ok to hold coumadin for procedures

## 2014-08-18 NOTE — Assessment & Plan Note (Signed)
Stable with no angina and good activity level.  Continue medical Rx Last intervention PDA/OM stents 2006

## 2014-08-18 NOTE — Assessment & Plan Note (Signed)
Cholesterol is at goal.  Continue current dose of statin and diet Rx.  No myalgias or side effects.  F/U  LFT's in 6 months. Lab Results  Component Value Date   LDLCALC 61 09/17/2009  Labs with primary

## 2014-08-18 NOTE — Assessment & Plan Note (Signed)
Discussed low sodium diet continue current meds  May need adjustment next visit

## 2014-08-18 NOTE — Assessment & Plan Note (Signed)
Good rate control and anticoagulation F/U [facility] office in 3 weeks No need for bridging

## 2014-08-23 ENCOUNTER — Other Ambulatory Visit: Payer: Self-pay | Admitting: Internal Medicine

## 2014-08-24 DIAGNOSIS — H04223 Epiphora due to insufficient drainage, bilateral lacrimal glands: Secondary | ICD-10-CM | POA: Diagnosis not present

## 2014-08-24 DIAGNOSIS — H04551 Acquired stenosis of right nasolacrimal duct: Secondary | ICD-10-CM | POA: Diagnosis not present

## 2014-08-24 DIAGNOSIS — H04221 Epiphora due to insufficient drainage, right lacrimal gland: Secondary | ICD-10-CM | POA: Diagnosis not present

## 2014-08-31 ENCOUNTER — Other Ambulatory Visit: Payer: Self-pay

## 2014-08-31 ENCOUNTER — Telehealth: Payer: Self-pay

## 2014-08-31 ENCOUNTER — Other Ambulatory Visit: Payer: Self-pay | Admitting: Internal Medicine

## 2014-08-31 NOTE — Telephone Encounter (Signed)
Called [facility] to give 14 tablets of metoprolol succ 100 mg take one tablet daily

## 2014-09-08 ENCOUNTER — Ambulatory Visit (INDEPENDENT_AMBULATORY_CARE_PROVIDER_SITE_OTHER): Payer: Medicare Other | Admitting: General Practice

## 2014-09-08 DIAGNOSIS — Z5181 Encounter for therapeutic drug level monitoring: Secondary | ICD-10-CM | POA: Diagnosis not present

## 2014-09-08 LAB — POCT INR: INR: 2.7

## 2014-09-08 NOTE — Progress Notes (Signed)
Pre visit review using our clinic review tool, if applicable. No additional management support is needed unless otherwise documented below in the visit note. 

## 2014-09-08 NOTE — Progress Notes (Signed)
Agree with plan 

## 2014-09-21 ENCOUNTER — Encounter: Payer: Self-pay | Admitting: Family

## 2014-09-21 ENCOUNTER — Ambulatory Visit (INDEPENDENT_AMBULATORY_CARE_PROVIDER_SITE_OTHER): Payer: Medicare Other | Admitting: Family

## 2014-09-21 VITALS — BP 138/86 | HR 85 | Temp 97.9°F | Resp 18 | Ht 64.0 in | Wt 202.1 lb

## 2014-09-21 DIAGNOSIS — H04551 Acquired stenosis of right nasolacrimal duct: Secondary | ICD-10-CM | POA: Diagnosis not present

## 2014-09-21 DIAGNOSIS — H04221 Epiphora due to insufficient drainage, right lacrimal gland: Secondary | ICD-10-CM | POA: Diagnosis not present

## 2014-09-21 DIAGNOSIS — Z23 Encounter for immunization: Secondary | ICD-10-CM | POA: Diagnosis not present

## 2014-09-21 DIAGNOSIS — Z0000 Encounter for general adult medical examination without abnormal findings: Secondary | ICD-10-CM

## 2014-09-21 DIAGNOSIS — E785 Hyperlipidemia, unspecified: Secondary | ICD-10-CM | POA: Insufficient documentation

## 2014-09-21 MED ORDER — METHOCARBAMOL 500 MG PO TABS: 500.0000 mg | ORAL_TABLET | Freq: Three times a day (TID) | ORAL | Status: DC | PRN

## 2014-09-21 NOTE — Progress Notes (Signed)
Subjective:    Patient ID: Judy [name], female    DOB: December 13, 1930, 81 y.o.   MRN: [id number]  Chief Complaint  Patient presents with  . CPE    Not fasting, would also like a refill on muscle relaxer    HPI:  Judy [name] is a 81 y.o. female who presents today for an annual wellness visit.   1) Health Maintenance -   Diet - Averages 3 meals per day; Balanced meals of whole grains, fruits and vegetables and lean meats; Decaf coffee in the morning   Exercise - Cleaning the house, walking around; no other structured exercise.   2) Preventative Exams / Immunizations:  Dental -- Up to date  Vision -- Up to date   Health Maintenance  Topic Date Due  . PNA vac Low Risk Adult (2 of 2 - PCV13) 05/28/2014  . COLONOSCOPY  09/20/2015 (Originally 08/03/1982)  . TETANUS/TDAP  09/20/2015 (Originally 08/04/1951)  . INFLUENZA VACCINE  01/11/2015  . DEXA SCAN  Addressed  . ZOSTAVAX  Completed    Immunization History  Administered Date(s) Administered  . Influenza Split 03/22/2012  . Influenza Whole 03/21/2011  . Influenza-Unspecified 03/12/2013  . Pneumococcal Polysaccharide-23 05/28/2013  . Zoster 06/12/2009     RISK FACTORS  Tobacco History  Smoking status  . Never Smoker   Smokeless tobacco  . Not on file     Cardiac risk factors: advanced age (older than 49 for men, 67 for women), hypertension, obesity (BMI >= 30 kg/m2) and sedentary lifestyle.  Depression Screen  Q1: Over the past two weeks, have you felt down, depressed or hopeless? No  Q2: Over the past two weeks, have you felt little interest or pleasure in doing things? No  Have you lost interest or pleasure in daily life? No  Do you often feel hopeless? No  Do you cry easily over simple problems? No  Activities of Daily Living In your present state of health, do you have any difficulty performing the following activities?:  Driving? No Managing money?  No Feeding yourself? No Getting from bed to  chair? No Climbing a flight of stairs? No Preparing food and eating?: No Bathing or showering? No Getting dressed: No Getting to the toilet? No Using the toilet: No Moving around from place to place: No In the past year have you fallen or had a near fall?:No   Home Safety Has smoke detector and wears seat belts. No firearms. No excess sun exposure. Are there smokers in your home (other than you)?  No Do you feel safe at home?  Yes  Hearing Difficulties: No Do you often ask people to speak up or repeat themselves? No Do you experience ringing or noises in your ears? No  Do you have difficulty understanding soft or whispered voices? No    Cognitive Testing  Alert? Yes   Normal Appearance? Yes  Oriented to person? Yes  Place? Yes   Time? Yes  Recall of three objects?  Yes  Can perform simple calculations? Yes  Displays appropriate judgment? Yes  Can read the correct time from a watch face? Yes  Do you feel that you have a problem with memory? No  Do you often misplace items? No   Advanced Directives have been discussed with the patient? Yes  Current Physicians/Providers and Suppliers  1. Gwendolyn Grant, MD - PCP / Primary Care 2. Dr. Baird Cancer - [place] Specialist 3) Jenkins Rouge, MD - Cardiology 4) Dr. Lorina Rabon - Plastic Surgeon/Eye  Surgery  Indicate any recent Medical Services you may have received from other than [facility] providers in the past year (date may be approximate).  All answers were reviewed with the patient and necessary referrals were made:  Mauricio Po, [place]   09/21/2014    Review of Systems  Constitutional: Denies fever, chills, fatigue, or significant weight gain/loss. HENT: Head: Denies headache or neck pain Ears: Denies changes in hearing, ringing in ears, earache, drainage Nose: Denies discharge, stuffiness, itching, nosebleed, sinus pain Throat: Denies sore throat, hoarseness, dry mouth, sores, thrush Eyes: Denies loss/changes in vision, pain,  redness, blurry/double vision, flashing lights Cardiovascular: Denies chest pain/discomfort, tightness, palpitations, shortness of breath with activity, difficulty lying down, swelling, sudden awakening with shortness of breath Respiratory: Denies shortness of breath, cough, sputum production, wheezing Gastrointestinal: Denies dysphasia, heartburn, change in appetite, nausea, change in bowel habits, rectal bleeding, constipation, diarrhea, yellow skin or eyes Genitourinary: Denies frequency, urgency, burning/pain, blood in urine, incontinence, change in urinary strength. Musculoskeletal: Denies muscle/joint pain, stiffness, back pain, redness or swelling of joints, trauma Skin: Denies rashes, lumps, itching, dryness, color changes, or hair/nail changes Neurological: Denies dizziness, fainting, seizures, weakness, numbness, tingling, tremor Psychiatric - Denies nervousness, stress, depression or memory loss Endocrine: Denies heat or cold intolerance, sweating, frequent urination, excessive thirst, changes in appetite Hematologic: Denies ease of bruising or bleeding    Objective:     BP 138/86 mmHg  Pulse 85  Temp(Src) 97.9 F (36.6 C) (Oral)  Resp 18  Ht 5\' 4"  (1.626 m)  Wt 202 lb 1.9 oz (91.681 kg)  BMI 34.68 kg/m2  SpO2 97% Nursing note and vital signs reviewed.  Physical Exam  Constitutional: She is oriented to person, place, and time. She appears well-developed and well-nourished. No distress.  HENT:  Band aid over right eye noted. She is having tear duct issues for which she is going this afternoon.   Cardiovascular: Normal rate, regular rhythm, normal heart sounds and intact distal pulses.   Pulmonary/Chest: Effort normal and breath sounds normal.  Neurological: She is alert and oriented to person, place, and time.  Skin: Skin is warm and dry.  Psychiatric: She has a normal mood and affect. Her behavior is normal. Judgment and thought content normal.       Assessment &  Plan:   During the course of the visit the patient was educated and counseled about appropriate screening and preventive services including:    Pneumococcal vaccine   Td vaccine  Colorectal cancer screening  Diabetes screening  Nutrition counseling   Diet review for nutrition referral? Yes ____  Not Indicated _X___   Patient Instructions (the written plan) was given to the patient.  Medicare Attestation I have personally reviewed: The patient's medical and social history Their use of alcohol, tobacco or illicit drugs Their current medications and supplements The patient's functional ability including ADLs,fall risks, home safety risks, cognitive, and hearing and visual impairment Diet and physical activities Evidence for depression or mood disorders  The patient's weight, height, BMI,  have been recorded in the chart.  I have made referrals, counseling, and provided education to the patient based on review of the above and I have provided the patient with a written personalized care plan for preventive services.     Mauricio Po, [place]   09/21/2014

## 2014-09-21 NOTE — Assessment & Plan Note (Addendum)
Reviewed and updated patient's medical, surgical, family and social history. Medications and allergies were also reviewed. Basic screenings for depression, activities of daily living, hearing, cognition and safety were performed. Provider list was updated and health plan was provided to the patient.   1) Anticipatory Guidance: Discussed importance of wearing a seatbelt while driving and not texting while driving; changing batteries in smoke detector at least once annually; wearing suntan lotion when outside; eating a balanced and moderate diet; getting physical activity at least 30 minutes per day.  2) Immunizations / Screenings / Labs:  Prevnar given today. Patient will check on Tetanus. All other immunizations are up-to-date per recommendations. Patient indicates she has completed a bone density screening. Patient has a set of colonoscopies. All other screenings are up-to-date per recommendations. Obtain CBC, BMET, Lipid profile and TSH.   Overall normal exam. Discussed risk factors for cardiovascular disease including obesity and hypertension. Hypertension appears well controlled with current medications. Discussed losing 5-10% of body weight as able. Increased nutrient density as well as weightbearing physical activity. Continue other healthy lifestyle choices. Follow up prevention exam in 1 year. Office follow up pending lab work or sooner if needed.

## 2014-09-21 NOTE — Addendum Note (Signed)
Addended by: Delice Bison E on: 09/21/2014 10:34 AM   Modules accepted: Orders

## 2014-09-21 NOTE — Progress Notes (Signed)
Pre visit review using our clinic review tool, if applicable. No additional management support is needed unless otherwise documented below in the visit note. 

## 2014-09-21 NOTE — Patient Instructions (Signed)
Thank you for choosing [facility].  Summary/Instructions:  Your prescription(s) have been submitted to your pharmacy or been printed and provided for you. Please take as directed and contact our office if you believe you are having problem(s) with the medication(s) or have any questions.  Please stop by the lab on the basement level of the building for your blood work. Your results will be released to MyChart (or called to you) after review, usually within 72 hours after test completion. If any changes need to be made, you will be notified at that same time.  If your symptoms worsen or fail to improve, please contact our office for further instruction, or in case of emergency go directly to the emergency room at the closest medical facility.   Health Maintenance Adopting a healthy lifestyle and getting preventive care can go a long way to promote health and wellness. Talk with your health care provider about what schedule of regular examinations is right for you. This is a good chance for you to check in with your provider about disease prevention and staying healthy. In between checkups, there are plenty of things you can do on your own. Experts have done a lot of research about which lifestyle changes and preventive measures are most likely to keep you healthy. Ask your health care provider for more information. WEIGHT AND DIET  Eat a healthy diet  Be sure to include plenty of vegetables, fruits, low-fat dairy products, and lean protein.  Do not eat a lot of foods high in solid fats, added sugars, or salt.  Get regular exercise. This is one of the most important things you can do for your health.  Most adults should exercise for at least 150 minutes each week. The exercise should increase your heart rate and make you sweat (moderate-intensity exercise).  Most adults should also do strengthening exercises at least twice a week. This is in addition to the moderate-intensity exercise.   Maintain a healthy weight  Body mass index (BMI) is a measurement that can be used to identify possible weight problems. It estimates body fat based on height and weight. Your health care provider can help determine your BMI and help you achieve or maintain a healthy weight.  For females 20 years of age and older:   A BMI below 18.5 is considered underweight.  A BMI of 18.5 to 24.9 is normal.  A BMI of 25 to 29.9 is considered overweight.  A BMI of 30 and above is considered obese.  Watch levels of cholesterol and blood lipids  You should start having your blood tested for lipids and cholesterol at 79 years of age, then have this test every 5 years.  You may need to have your cholesterol levels checked more often if:  Your lipid or cholesterol levels are high.  You are older than 79 years of age.  You are at high risk for heart disease.  CANCER SCREENING   Lung Cancer  Lung cancer screening is recommended for adults 55-80 years old who are at high risk for lung cancer because of a history of smoking.  A yearly low-dose CT scan of the lungs is recommended for people who:  Currently smoke.  Have quit within the past 15 years.  Have at least a 30-pack-year history of smoking. A pack year is smoking an average of one pack of cigarettes a day for 1 year.  Yearly screening should continue until it has been 15 years since you quit.  Yearly   screening should stop if you develop a health problem that would prevent you from having lung cancer treatment.  Breast Cancer  Practice breast self-awareness. This means understanding how your breasts normally appear and feel.  It also means doing regular breast self-exams. Let your health care provider know about any changes, no matter how small.  If you are in your 20s or 30s, you should have a clinical breast exam (CBE) by a health care provider every 1-3 years as part of a regular health exam.  If you are 40 or older, have a  CBE every year. Also consider having a breast X-ray (mammogram) every year.  If you have a family history of breast cancer, talk to your health care provider about genetic screening.  If you are at high risk for breast cancer, talk to your health care provider about having an MRI and a mammogram every year.  Breast cancer gene (BRCA) assessment is recommended for women who have family members with BRCA-related cancers. BRCA-related cancers include:  Breast.  Ovarian.  Tubal.  Peritoneal cancers.  Results of the assessment will determine the need for genetic counseling and BRCA1 and BRCA2 testing. Cervical Cancer Routine pelvic examinations to screen for cervical cancer are no longer recommended for nonpregnant women who are considered low risk for cancer of the pelvic organs (ovaries, uterus, and vagina) and who do not have symptoms. A pelvic examination may be necessary if you have symptoms including those associated with pelvic infections. Ask your health care provider if a screening pelvic exam is right for you.   The Pap test is the screening test for cervical cancer for women who are considered at risk.  If you had a hysterectomy for a problem that was not cancer or a condition that could lead to cancer, then you no longer need Pap tests.  If you are older than 65 years, and you have had normal Pap tests for the past 10 years, you no longer need to have Pap tests.  If you have had past treatment for cervical cancer or a condition that could lead to cancer, you need Pap tests and screening for cancer for at least 20 years after your treatment.  If you no longer get a Pap test, assess your risk factors if they change (such as having a new sexual partner). This can affect whether you should start being screened again.  Some women have medical problems that increase their chance of getting cervical cancer. If this is the case for you, your health care provider may recommend more  frequent screening and Pap tests.  The human papillomavirus (HPV) test is another test that may be used for cervical cancer screening. The HPV test looks for the virus that can cause cell changes in the cervix. The cells collected during the Pap test can be tested for HPV.  The HPV test can be used to screen women 30 years of age and older. Getting tested for HPV can extend the interval between normal Pap tests from three to five years.  An HPV test also should be used to screen women of any age who have unclear Pap test results.  After 79 years of age, women should have HPV testing as often as Pap tests.  Colorectal Cancer  This type of cancer can be detected and often prevented.  Routine colorectal cancer screening usually begins at 79 years of age and continues through 79 years of age.  Your health care provider may recommend screening at an   earlier age if you have risk factors for colon cancer.  Your health care provider may also recommend using home test kits to check for hidden blood in the stool.  A small camera at the end of a tube can be used to examine your colon directly (sigmoidoscopy or colonoscopy). This is done to check for the earliest forms of colorectal cancer.  Routine screening usually begins at age 50.  Direct examination of the colon should be repeated every 5-10 years through 79 years of age. However, you may need to be screened more often if early forms of precancerous polyps or small growths are found. Skin Cancer  Check your skin from head to toe regularly.  Tell your health care provider about any new moles or changes in moles, especially if there is a change in a mole's shape or color.  Also tell your health care provider if you have a mole that is larger than the size of a pencil eraser.  Always use sunscreen. Apply sunscreen liberally and repeatedly throughout the day.  Protect yourself by wearing long sleeves, pants, a wide-brimmed hat, and sunglasses  whenever you are outside. HEART DISEASE, DIABETES, AND HIGH BLOOD PRESSURE   Have your blood pressure checked at least every 1-2 years. High blood pressure causes heart disease and increases the risk of stroke.  If you are between 55 years and 79 years old, ask your health care provider if you should take aspirin to prevent strokes.  Have regular diabetes screenings. This involves taking a blood sample to check your fasting blood sugar level.  If you are at a normal weight and have a low risk for diabetes, have this test once every three years after 79 years of age.  If you are overweight and have a high risk for diabetes, consider being tested at a younger age or more often. PREVENTING INFECTION  Hepatitis B  If you have a higher risk for hepatitis B, you should be screened for this virus. You are considered at high risk for hepatitis B if:  You were born in a country where hepatitis B is common. Ask your health care provider which countries are considered high risk.  Your parents were born in a high-risk country, and you have not been immunized against hepatitis B (hepatitis B vaccine).  You have HIV or AIDS.  You use needles to inject street drugs.  You live with someone who has hepatitis B.  You have had sex with someone who has hepatitis B.  You get hemodialysis treatment.  You take certain medicines for conditions, including cancer, organ transplantation, and autoimmune conditions. Hepatitis C  Blood testing is recommended for:  Everyone born from 1945 through 1965.  Anyone with known risk factors for hepatitis C. Sexually transmitted infections (STIs)  You should be screened for sexually transmitted infections (STIs) including gonorrhea and chlamydia if:  You are sexually active and are younger than 79 years of age.  You are older than 79 years of age and your health care provider tells you that you are at risk for this type of infection.  Your sexual activity  has changed since you were last screened and you are at an increased risk for chlamydia or gonorrhea. Ask your health care provider if you are at risk.  If you do not have HIV, but are at risk, it may be recommended that you take a prescription medicine daily to prevent HIV infection. This is called pre-exposure prophylaxis (PrEP). You are considered at risk   if:  You are sexually active and do not regularly use condoms or know the HIV status of your partner(s).  You take drugs by injection.  You are sexually active with a partner who has HIV. Talk with your health care provider about whether you are at high risk of being infected with HIV. If you choose to begin PrEP, you should first be tested for HIV. You should then be tested every 3 months for as long as you are taking PrEP.  PREGNANCY   If you are premenopausal and you may become pregnant, ask your health care provider about preconception counseling.  If you may become pregnant, take 400 to 800 micrograms (mcg) of folic acid every day.  If you want to prevent pregnancy, talk to your health care provider about birth control (contraception). OSTEOPOROSIS AND MENOPAUSE   Osteoporosis is a disease in which the bones lose minerals and strength with aging. This can result in serious bone fractures. Your risk for osteoporosis can be identified using a bone density scan.  If you are 65 years of age or older, or if you are at risk for osteoporosis and fractures, ask your health care provider if you should be screened.  Ask your health care provider whether you should take a calcium or vitamin D supplement to lower your risk for osteoporosis.  Menopause may have certain physical symptoms and risks.  Hormone replacement therapy may reduce some of these symptoms and risks. Talk to your health care provider about whether hormone replacement therapy is right for you.  HOME CARE INSTRUCTIONS   Schedule regular health, dental, and eye  exams.  Stay current with your immunizations.   Do not use any tobacco products including cigarettes, chewing tobacco, or electronic cigarettes.  If you are pregnant, do not drink alcohol.  If you are breastfeeding, limit how much and how often you drink alcohol.  Limit alcohol intake to no more than 1 drink per day for nonpregnant women. One drink equals 12 ounces of beer, 5 ounces of wine, or 1 ounces of hard liquor.  Do not use street drugs.  Do not share needles.  Ask your health care provider for help if you need support or information about quitting drugs.  Tell your health care provider if you often feel depressed.  Tell your health care provider if you have ever been abused or do not feel safe at home. Document Released: 12/12/2010 Document Revised: 10/13/2013 Document Reviewed: 04/30/2013 Exit[employer] Patient Information 2015 [employer]. This information is not intended to replace advice given to you by your health care provider. Make sure you discuss any questions you have with your health care provider.   

## 2014-09-22 ENCOUNTER — Other Ambulatory Visit: Payer: Self-pay | Admitting: Cardiovascular Disease

## 2014-09-25 ENCOUNTER — Ambulatory Visit (HOSPITAL_COMMUNITY): Payer: Medicare Other | Attending: Cardiovascular Disease

## 2014-09-25 DIAGNOSIS — I10 Essential (primary) hypertension: Secondary | ICD-10-CM | POA: Insufficient documentation

## 2014-09-25 DIAGNOSIS — I2510 Atherosclerotic heart disease of native coronary artery without angina pectoris: Secondary | ICD-10-CM | POA: Insufficient documentation

## 2014-09-25 DIAGNOSIS — I6523 Occlusion and stenosis of bilateral carotid arteries: Secondary | ICD-10-CM | POA: Diagnosis not present

## 2014-09-25 DIAGNOSIS — R0989 Other specified symptoms and signs involving the circulatory and respiratory systems: Secondary | ICD-10-CM | POA: Diagnosis not present

## 2014-09-25 NOTE — Progress Notes (Signed)
Carotid duplex scan performed 

## 2014-09-29 ENCOUNTER — Encounter: Payer: Medicare Other | Admitting: Internal Medicine

## 2014-09-29 DIAGNOSIS — H35053 Retinal neovascularization, unspecified, bilateral: Secondary | ICD-10-CM | POA: Diagnosis not present

## 2014-09-29 DIAGNOSIS — H35372 Puckering of macula, left eye: Secondary | ICD-10-CM | POA: Diagnosis not present

## 2014-09-29 DIAGNOSIS — H3532 Exudative age-related macular degeneration: Secondary | ICD-10-CM | POA: Diagnosis not present

## 2014-09-29 DIAGNOSIS — H43813 Vitreous degeneration, bilateral: Secondary | ICD-10-CM | POA: Diagnosis not present

## 2014-10-06 ENCOUNTER — Ambulatory Visit (INDEPENDENT_AMBULATORY_CARE_PROVIDER_SITE_OTHER): Payer: Medicare Other | Admitting: General Practice

## 2014-10-06 DIAGNOSIS — Z5181 Encounter for therapeutic drug level monitoring: Secondary | ICD-10-CM

## 2014-10-06 LAB — POCT INR: INR: 2.9

## 2014-10-06 NOTE — Progress Notes (Signed)
Pre visit review using our clinic review tool, if applicable. No additional management support is needed unless otherwise documented below in the visit note. 

## 2014-10-06 NOTE — Progress Notes (Signed)
Agree with plan 

## 2014-10-10 ENCOUNTER — Other Ambulatory Visit: Payer: Self-pay | Admitting: Cardiovascular Disease

## 2014-10-13 ENCOUNTER — Other Ambulatory Visit: Payer: Self-pay | Admitting: Cardiovascular Disease

## 2014-10-29 ENCOUNTER — Other Ambulatory Visit: Payer: Self-pay | Admitting: Cardiovascular Disease

## 2014-10-31 ENCOUNTER — Other Ambulatory Visit: Payer: Self-pay | Admitting: Internal Medicine

## 2014-11-02 ENCOUNTER — Other Ambulatory Visit: Payer: Self-pay | Admitting: General Practice

## 2014-11-02 MED ORDER — WARFARIN SODIUM 2.5 MG PO TABS: ORAL_TABLET | ORAL | Status: DC

## 2014-11-03 ENCOUNTER — Ambulatory Visit (INDEPENDENT_AMBULATORY_CARE_PROVIDER_SITE_OTHER): Payer: Medicare Other | Admitting: General Practice

## 2014-11-03 DIAGNOSIS — I4891 Unspecified atrial fibrillation: Secondary | ICD-10-CM

## 2014-11-03 DIAGNOSIS — Z5181 Encounter for therapeutic drug level monitoring: Secondary | ICD-10-CM | POA: Diagnosis not present

## 2014-11-03 LAB — POCT INR: INR: 2.4

## 2014-11-03 NOTE — Progress Notes (Signed)
Agree with plan 

## 2014-11-03 NOTE — Progress Notes (Signed)
Pre visit review using our clinic review tool, if applicable. No additional management support is needed unless otherwise documented below in the visit note. 

## 2014-11-09 IMAGING — CR DG CHEST 1V PORT
1 series · 1 of 1 positions shown · non-contrast
Comparison: 05/17/2005.

CLINICAL DATA: Left chest pain.

EXAM:
PORTABLE CHEST - 1 VIEW

[AP]
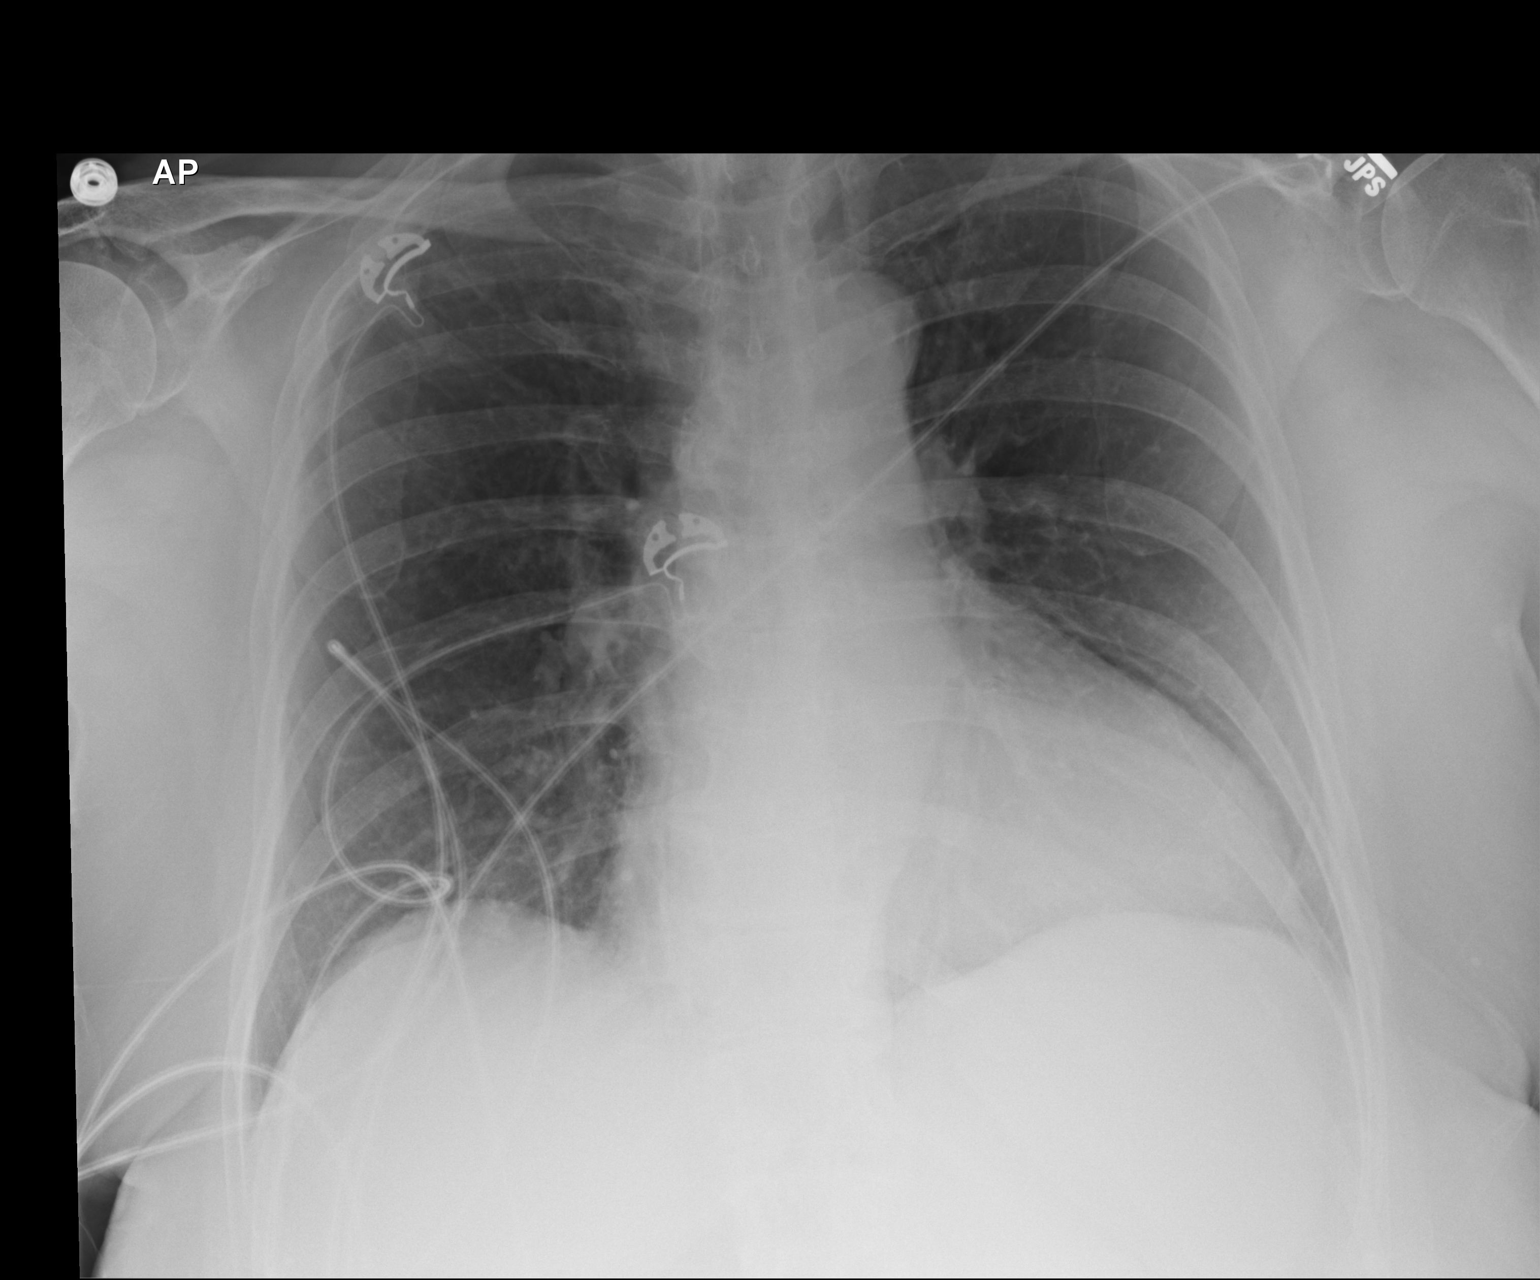

[1 of 1 positions shown; findings below may reference images not displayed]

FINDINGS: Trachea is midline. Heart is enlarged, stable. Mild scarring in the
medial lung bases. Lungs are otherwise clear. No pleural fluid.
IMPRESSION: No acute findings.

## 2014-11-24 DIAGNOSIS — H35053 Retinal neovascularization, unspecified, bilateral: Secondary | ICD-10-CM | POA: Diagnosis not present

## 2014-11-24 DIAGNOSIS — H35372 Puckering of macula, left eye: Secondary | ICD-10-CM | POA: Diagnosis not present

## 2014-11-24 DIAGNOSIS — H3532 Exudative age-related macular degeneration: Secondary | ICD-10-CM | POA: Diagnosis not present

## 2014-11-24 DIAGNOSIS — H43813 Vitreous degeneration, bilateral: Secondary | ICD-10-CM | POA: Diagnosis not present

## 2014-12-01 ENCOUNTER — Ambulatory Visit (INDEPENDENT_AMBULATORY_CARE_PROVIDER_SITE_OTHER): Payer: Medicare Other | Admitting: General Practice

## 2014-12-01 DIAGNOSIS — Z5181 Encounter for therapeutic drug level monitoring: Secondary | ICD-10-CM | POA: Diagnosis not present

## 2014-12-01 DIAGNOSIS — I4891 Unspecified atrial fibrillation: Secondary | ICD-10-CM

## 2014-12-01 LAB — POCT INR: INR: 1.9

## 2014-12-01 NOTE — Progress Notes (Signed)
Pre visit review using our clinic review tool, if applicable. No additional management support is needed unless otherwise documented below in the visit note. 

## 2014-12-01 NOTE — Progress Notes (Signed)
I have reviewed and agree with the plan. 

## 2014-12-07 DIAGNOSIS — H04222 Epiphora due to insufficient drainage, left lacrimal gland: Secondary | ICD-10-CM | POA: Diagnosis not present

## 2014-12-07 DIAGNOSIS — H04551 Acquired stenosis of right nasolacrimal duct: Secondary | ICD-10-CM | POA: Diagnosis not present

## 2014-12-07 DIAGNOSIS — H04221 Epiphora due to insufficient drainage, right lacrimal gland: Secondary | ICD-10-CM | POA: Diagnosis not present

## 2014-12-07 DIAGNOSIS — H04552 Acquired stenosis of left nasolacrimal duct: Secondary | ICD-10-CM | POA: Diagnosis not present

## 2014-12-17 ENCOUNTER — Other Ambulatory Visit: Payer: Self-pay | Admitting: Internal Medicine

## 2014-12-24 ENCOUNTER — Other Ambulatory Visit: Payer: Self-pay | Admitting: Cardiovascular Disease

## 2014-12-25 ENCOUNTER — Other Ambulatory Visit: Payer: Self-pay | Admitting: *Deleted

## 2014-12-25 MED ORDER — POTASSIUM CHLORIDE ER 10 MEQ PO CPCR: 10.0000 meq | ORAL_CAPSULE | Freq: Every day | ORAL | Status: DC

## 2014-12-25 MED ORDER — PRAVASTATIN SODIUM 80 MG PO TABS: 80.0000 mg | ORAL_TABLET | Freq: Every day | ORAL | Status: DC

## 2015-01-05 ENCOUNTER — Encounter: Payer: Medicare Other | Admitting: Internal Medicine

## 2015-01-12 ENCOUNTER — Ambulatory Visit (INDEPENDENT_AMBULATORY_CARE_PROVIDER_SITE_OTHER): Payer: Medicare Other | Admitting: General Practice

## 2015-01-12 DIAGNOSIS — Z5181 Encounter for therapeutic drug level monitoring: Secondary | ICD-10-CM

## 2015-01-12 DIAGNOSIS — I4891 Unspecified atrial fibrillation: Secondary | ICD-10-CM | POA: Diagnosis not present

## 2015-01-12 LAB — POCT INR: INR: 1.8

## 2015-01-12 NOTE — Progress Notes (Signed)
I have reviewed and agree with the plan. 

## 2015-01-12 NOTE — Progress Notes (Signed)
Pre visit review using our clinic review tool, if applicable. No additional management support is needed unless otherwise documented below in the visit note. 

## 2015-02-02 DIAGNOSIS — H35053 Retinal neovascularization, unspecified, bilateral: Secondary | ICD-10-CM | POA: Diagnosis not present

## 2015-02-02 DIAGNOSIS — H3532 Exudative age-related macular degeneration: Secondary | ICD-10-CM | POA: Diagnosis not present

## 2015-02-02 DIAGNOSIS — H35372 Puckering of macula, left eye: Secondary | ICD-10-CM | POA: Diagnosis not present

## 2015-02-09 ENCOUNTER — Ambulatory Visit (INDEPENDENT_AMBULATORY_CARE_PROVIDER_SITE_OTHER): Payer: Medicare Other | Admitting: General Practice

## 2015-02-09 DIAGNOSIS — Z5181 Encounter for therapeutic drug level monitoring: Secondary | ICD-10-CM

## 2015-02-09 DIAGNOSIS — I4891 Unspecified atrial fibrillation: Secondary | ICD-10-CM | POA: Diagnosis not present

## 2015-02-09 LAB — POCT INR: INR: 2.5

## 2015-02-09 NOTE — Progress Notes (Signed)
Pre visit review using our clinic review tool, if applicable. No additional management support is needed unless otherwise documented below in the visit note. 

## 2015-02-09 NOTE — Patient Instructions (Signed)
9/7 - Take last dose of coumadin until after eye surgery. 9/12 - After surgery take 1 1/2 tablets 9/13 - Take 1 1/2 tablets 9/14 - Resume current dosage.

## 2015-02-09 NOTE — Progress Notes (Signed)
I have reviewed and agree with the plan. 

## 2015-02-22 ENCOUNTER — Other Ambulatory Visit: Payer: Self-pay | Admitting: Ophthalmology

## 2015-02-22 DIAGNOSIS — J320 Chronic maxillary sinusitis: Secondary | ICD-10-CM | POA: Diagnosis not present

## 2015-02-22 DIAGNOSIS — J322 Chronic ethmoidal sinusitis: Secondary | ICD-10-CM | POA: Diagnosis not present

## 2015-02-22 DIAGNOSIS — H04302 Unspecified dacryocystitis of left lacrimal passage: Secondary | ICD-10-CM | POA: Diagnosis not present

## 2015-02-22 DIAGNOSIS — H04222 Epiphora due to insufficient drainage, left lacrimal gland: Secondary | ICD-10-CM | POA: Diagnosis not present

## 2015-02-22 DIAGNOSIS — H04552 Acquired stenosis of left nasolacrimal duct: Secondary | ICD-10-CM | POA: Diagnosis not present

## 2015-03-08 DIAGNOSIS — H04552 Acquired stenosis of left nasolacrimal duct: Secondary | ICD-10-CM | POA: Diagnosis not present

## 2015-03-08 DIAGNOSIS — H04222 Epiphora due to insufficient drainage, left lacrimal gland: Secondary | ICD-10-CM | POA: Diagnosis not present

## 2015-03-09 ENCOUNTER — Ambulatory Visit (INDEPENDENT_AMBULATORY_CARE_PROVIDER_SITE_OTHER): Payer: Medicare Other | Admitting: General Practice

## 2015-03-09 DIAGNOSIS — Z5181 Encounter for therapeutic drug level monitoring: Secondary | ICD-10-CM

## 2015-03-09 DIAGNOSIS — I4891 Unspecified atrial fibrillation: Secondary | ICD-10-CM

## 2015-03-09 LAB — POCT INR: INR: 2.3

## 2015-03-09 NOTE — Progress Notes (Signed)
I have reviewed and agree with the plan. 

## 2015-03-09 NOTE — Progress Notes (Signed)
Pre visit review using our clinic review tool, if applicable. No additional management support is needed unless otherwise documented below in the visit note. 

## 2015-03-18 ENCOUNTER — Telehealth: Payer: Self-pay

## 2015-03-18 ENCOUNTER — Other Ambulatory Visit: Payer: Self-pay | Admitting: Internal Medicine

## 2015-03-18 MED ORDER — WARFARIN SODIUM 2.5 MG PO TABS: ORAL_TABLET | ORAL | Status: DC

## 2015-03-18 NOTE — Addendum Note (Signed)
Addended by: Pricilla Holm A on: 03/18/2015 09:54 AM   Modules accepted: Orders

## 2015-03-18 NOTE — Telephone Encounter (Signed)
With greg calone and cindy/coumadin being absent from office---would you be ok with me refilling coumadin rx for 90 tablets per patient request----[facility] [address]----please advise, i will call in to pharm, thanks

## 2015-03-18 NOTE — Telephone Encounter (Signed)
Sent in 90 pills to [facility] [address].

## 2015-03-18 NOTE — Telephone Encounter (Signed)
Patient advised that coumadin rx has been sent to [facility]

## 2015-03-25 ENCOUNTER — Other Ambulatory Visit: Payer: Self-pay | Admitting: Cardiovascular Disease

## 2015-04-03 ENCOUNTER — Other Ambulatory Visit: Payer: Self-pay | Admitting: Cardiovascular Disease

## 2015-04-05 DIAGNOSIS — H02413 Mechanical ptosis of bilateral eyelids: Secondary | ICD-10-CM | POA: Diagnosis not present

## 2015-04-05 DIAGNOSIS — H04222 Epiphora due to insufficient drainage, left lacrimal gland: Secondary | ICD-10-CM | POA: Diagnosis not present

## 2015-04-05 DIAGNOSIS — H53483 Generalized contraction of visual field, bilateral: Secondary | ICD-10-CM | POA: Diagnosis not present

## 2015-04-05 DIAGNOSIS — H04552 Acquired stenosis of left nasolacrimal duct: Secondary | ICD-10-CM | POA: Diagnosis not present

## 2015-04-06 ENCOUNTER — Ambulatory Visit (INDEPENDENT_AMBULATORY_CARE_PROVIDER_SITE_OTHER): Payer: Medicare Other | Admitting: General Practice

## 2015-04-06 DIAGNOSIS — Z5181 Encounter for therapeutic drug level monitoring: Secondary | ICD-10-CM | POA: Diagnosis not present

## 2015-04-06 DIAGNOSIS — Z23 Encounter for immunization: Secondary | ICD-10-CM | POA: Diagnosis not present

## 2015-04-06 LAB — POCT INR: INR: 2.5

## 2015-04-06 NOTE — Progress Notes (Signed)
Pre visit review using our clinic review tool, if applicable. No additional management support is needed unless otherwise documented below in the visit note. 

## 2015-04-06 NOTE — Progress Notes (Signed)
I have reviewed and agree with the plan. 

## 2015-04-08 ENCOUNTER — Encounter: Payer: Self-pay | Admitting: *Deleted

## 2015-04-10 ENCOUNTER — Other Ambulatory Visit: Payer: Self-pay | Admitting: Cardiovascular Disease

## 2015-04-13 DIAGNOSIS — H35372 Puckering of macula, left eye: Secondary | ICD-10-CM | POA: Diagnosis not present

## 2015-04-13 DIAGNOSIS — H353231 Exudative age-related macular degeneration, bilateral, with active choroidal neovascularization: Secondary | ICD-10-CM | POA: Diagnosis not present

## 2015-04-13 DIAGNOSIS — H43813 Vitreous degeneration, bilateral: Secondary | ICD-10-CM | POA: Diagnosis not present

## 2015-04-22 ENCOUNTER — Encounter: Payer: Self-pay | Admitting: *Deleted

## 2015-04-26 ENCOUNTER — Other Ambulatory Visit: Payer: Self-pay | Admitting: Cardiovascular Disease

## 2015-04-27 NOTE — Progress Notes (Addendum)
Patient ID: Judy [name], female   DOB: 21-Mar-1931, 81 y.o.   MRN: [id number]   81 y.o.  female with prior history of coronary artery disease status post prior stenting of the right coronary artery in 1996 and subsequent stenting of the PDA and OM in 2006. Chronic afib on anticoagulation with rate controll Seen in ER 10/14 for chest pain d/c home r/o Since then, she has done quite well. She lives by herself in [place] and is quite active around her home without symptoms or limitations. She does have macular degeneration requires monthly injections of the left eye and every other monthly injections into the right eye.   09/25/14  1-39% ICA Duplex  Problems with macular degeneration and tear duct on right eye Held coumadin a week ago for stent  Some neuropathy in legs  ROS: Denies fever, malais, weight loss, blurry vision, decreased visual acuity, cough, sputum, SOB, hemoptysis, pleuritic pain, palpitaitons, heartburn, abdominal pain, melena, lower extremity edema, claudication, or rash.  All other systems reviewed and negative  General: Affect appropriate Healthy:  appears stated age 55: normal Neck supple with no adenopathy JVP normal no bruits no thyromegaly Lungs clear with no wheezing and good diaphragmatic motion Heart:  S1/S2 no murmur, no rub, gallop or click PMI normal Abdomen: benighn, BS positve, no tenderness, no AAA no bruit.  No HSM or HJR Distal pulses intact with no bruits No edema Neuro non-focal Skin warm and dry No muscular weakness   Current Outpatient Prescriptions  Medication Sig Dispense Refill  . aspirin 81 MG tablet Take 81 mg by mouth daily.      Marland Kitchen azelastine (ASTELIN) 0.1 % nasal spray Use in each nostril as directed 30 mL 6  . diltiazem (CARDIZEM CD) 120 MG 24 hr capsule TAKE ONE CAPSULE BY MOUTH NIGHTLY AT BEDTIME 90 capsule 3  . Fexofenadine HCl (ALLEGRA ALLERGY PO) Take 1 tablet by mouth daily.     . fluticasone (FLONASE) 50 MCG/ACT nasal spray  Place 1 spray into the nose daily. RIGHT  NARE    . furosemide (LASIX) 20 MG tablet TAKE ONE TABLET BY MOUTH ONE TIME DAILY 90 tablet 1  . isosorbide mononitrate (IMDUR) 30 MG 24 hr tablet TAKE ONE TABLET BY MOUTH NIGHTLY AT BEDTIME 30 tablet 1  . lisinopril (PRINIVIL,ZESTRIL) 10 MG tablet TAKE ONE TABLET BY MOUTH ONE TIME DAILY 90 tablet 0  . methocarbamol (ROBAXIN) 500 MG tablet Take 1 tablet (500 mg total) by mouth every 8 (eight) hours as needed for muscle spasms. 30 tablet 0  . metoprolol succinate (TOPROL-XL) 100 MG 24 hr tablet TAKE ONE TABLET BY MOUTH ONE TIME DAILY 90 tablet 1  . Multiple Vitamin (MULTIVITAMIN WITH MINERALS) TABS tablet Take 1 tablet by mouth daily.    . nitroGLYCERIN (NITROSTAT) 0.4 MG SL tablet Place 0.4 mg under the tongue every 5 (five) minutes as needed for chest pain (3 doses max).    . potassium chloride (MICRO-K) 10 MEQ CR capsule TAKE 1 CAPSULE (10 MEQ TOTAL) BY MOUTH DAILY. 90 capsule 1  . pravastatin (PRAVACHOL) 80 MG tablet TAKE 1 TABLET (80 MG TOTAL) BY MOUTH AT BEDTIME. 90 tablet 1  . tobramycin (TOBREX) 0.3 % ophthalmic solution Place 1 drop into the left eye See admin instructions. Use for 2 days prior to procedure and day of procedure.    . warfarin (COUMADIN) 2.5 MG tablet Take as directed by anticoagulation clinic 90 tablet 0   No current facility-administered medications for this  visit.    Allergies  Review of patient's allergies indicates no known allergies.  Electrocardiogram:  afib low voltage non specific ST chages  10/14  03/09/14  afib rate 72 ? Old IMI  04/28/15 afib rate 74 Old IMI  Assessment and Plan CAD: Stable with no angina and good activity level.  Continue medical Rx Carotid: plaque no stenosis f/u duplex 2 years Afib: chronic good rate control and anticoagulation  Anticoagulation:  No bleeding issues INR Rx  Chol::  Labs wit hprimary  Lab Results  Component Value Date   LDLCALC 61 09/17/2009      Jenkins Rouge

## 2015-04-28 ENCOUNTER — Encounter: Payer: Self-pay | Admitting: Cardiovascular Disease

## 2015-04-28 ENCOUNTER — Ambulatory Visit (INDEPENDENT_AMBULATORY_CARE_PROVIDER_SITE_OTHER): Payer: Medicare Other | Admitting: Cardiovascular Disease

## 2015-04-28 VITALS — BP 140/86 | HR 74 | Ht 64.0 in | Wt 204.1 lb

## 2015-04-28 DIAGNOSIS — I10 Essential (primary) hypertension: Secondary | ICD-10-CM

## 2015-04-28 DIAGNOSIS — I6523 Occlusion and stenosis of bilateral carotid arteries: Secondary | ICD-10-CM

## 2015-04-28 DIAGNOSIS — E782 Mixed hyperlipidemia: Secondary | ICD-10-CM | POA: Diagnosis not present

## 2015-04-28 NOTE — Patient Instructions (Addendum)

## 2015-05-18 ENCOUNTER — Ambulatory Visit (INDEPENDENT_AMBULATORY_CARE_PROVIDER_SITE_OTHER): Payer: Medicare Other | Admitting: General Practice

## 2015-05-18 DIAGNOSIS — I4891 Unspecified atrial fibrillation: Secondary | ICD-10-CM

## 2015-05-18 DIAGNOSIS — Z5181 Encounter for therapeutic drug level monitoring: Secondary | ICD-10-CM

## 2015-05-18 LAB — POCT INR: INR: 2.1

## 2015-05-18 NOTE — Progress Notes (Signed)
Pre visit review using our clinic review tool, if applicable. No additional management support is needed unless otherwise documented below in the visit note. 

## 2015-05-18 NOTE — Progress Notes (Signed)
I have reviewed and agree with the plan. 

## 2015-06-06 ENCOUNTER — Other Ambulatory Visit: Payer: Self-pay | Admitting: Cardiovascular Disease

## 2015-06-09 ENCOUNTER — Other Ambulatory Visit: Payer: Self-pay | Admitting: *Deleted

## 2015-06-09 MED ORDER — ISOSORBIDE MONONITRATE ER 30 MG PO TB24: 30.0000 mg | ORAL_TABLET | Freq: Every day | ORAL | Status: DC

## 2015-06-09 NOTE — Telephone Encounter (Signed)
CALLED AND ASK FOR 90/3 ON IMDUR

## 2015-06-12 ENCOUNTER — Other Ambulatory Visit: Payer: Self-pay | Admitting: Internal Medicine

## 2015-06-15 ENCOUNTER — Other Ambulatory Visit: Payer: Self-pay | Admitting: Internal Medicine

## 2015-06-16 ENCOUNTER — Other Ambulatory Visit: Payer: Self-pay | Admitting: Cardiovascular Disease

## 2015-06-16 ENCOUNTER — Other Ambulatory Visit: Payer: Self-pay | Admitting: Internal Medicine

## 2015-06-16 ENCOUNTER — Other Ambulatory Visit: Payer: Self-pay | Admitting: General Practice

## 2015-06-16 MED ORDER — WARFARIN SODIUM 2.5 MG PO TABS: ORAL_TABLET | ORAL | Status: DC

## 2015-06-16 NOTE — Telephone Encounter (Signed)
Pt calling requesting a refill on Warfarin. Please advise °

## 2015-06-16 NOTE — Telephone Encounter (Signed)
Pt's Coumadin managed by PCP office.  Will send to their office for refill.

## 2015-06-21 DIAGNOSIS — H35053 Retinal neovascularization, unspecified, bilateral: Secondary | ICD-10-CM | POA: Diagnosis not present

## 2015-06-21 DIAGNOSIS — H353231 Exudative age-related macular degeneration, bilateral, with active choroidal neovascularization: Secondary | ICD-10-CM | POA: Diagnosis not present

## 2015-06-21 DIAGNOSIS — H35372 Puckering of macula, left eye: Secondary | ICD-10-CM | POA: Diagnosis not present

## 2015-06-21 DIAGNOSIS — H35423 Microcystoid degeneration of retina, bilateral: Secondary | ICD-10-CM | POA: Diagnosis not present

## 2015-06-29 ENCOUNTER — Ambulatory Visit: Payer: Medicare Other

## 2015-07-08 ENCOUNTER — Other Ambulatory Visit: Payer: Self-pay | Admitting: Cardiovascular Disease

## 2015-07-14 ENCOUNTER — Ambulatory Visit (INDEPENDENT_AMBULATORY_CARE_PROVIDER_SITE_OTHER): Payer: Medicare Other | Admitting: General Practice

## 2015-07-14 DIAGNOSIS — I4891 Unspecified atrial fibrillation: Secondary | ICD-10-CM | POA: Diagnosis not present

## 2015-07-14 DIAGNOSIS — Z5181 Encounter for therapeutic drug level monitoring: Secondary | ICD-10-CM

## 2015-07-14 LAB — POCT INR: INR: 1.9

## 2015-07-14 NOTE — Progress Notes (Signed)
I have reviewed and agree with the plan. 

## 2015-07-14 NOTE — Progress Notes (Signed)
Pre visit review using our clinic review tool, if applicable. No additional management support is needed unless otherwise documented below in the visit note. 

## 2015-08-04 ENCOUNTER — Other Ambulatory Visit: Payer: Self-pay | Admitting: Nurse Practitioner

## 2015-08-04 NOTE — Telephone Encounter (Signed)
REFILL 

## 2015-08-23 DIAGNOSIS — H35423 Microcystoid degeneration of retina, bilateral: Secondary | ICD-10-CM | POA: Diagnosis not present

## 2015-08-23 DIAGNOSIS — H35372 Puckering of macula, left eye: Secondary | ICD-10-CM | POA: Diagnosis not present

## 2015-08-23 DIAGNOSIS — H43813 Vitreous degeneration, bilateral: Secondary | ICD-10-CM | POA: Diagnosis not present

## 2015-08-23 DIAGNOSIS — H353231 Exudative age-related macular degeneration, bilateral, with active choroidal neovascularization: Secondary | ICD-10-CM | POA: Diagnosis not present

## 2015-08-25 ENCOUNTER — Ambulatory Visit (INDEPENDENT_AMBULATORY_CARE_PROVIDER_SITE_OTHER): Payer: Medicare Other | Admitting: General Practice

## 2015-08-25 ENCOUNTER — Ambulatory Visit: Payer: Medicare Other

## 2015-08-25 DIAGNOSIS — I4891 Unspecified atrial fibrillation: Secondary | ICD-10-CM

## 2015-08-25 DIAGNOSIS — Z5181 Encounter for therapeutic drug level monitoring: Secondary | ICD-10-CM | POA: Diagnosis not present

## 2015-08-25 LAB — POCT INR: INR: 2.8

## 2015-08-25 NOTE — Progress Notes (Signed)
Pre visit review using our clinic review tool, if applicable. No additional management support is needed unless otherwise documented below in the visit note. 

## 2015-09-11 ENCOUNTER — Other Ambulatory Visit: Payer: Self-pay | Admitting: Internal Medicine

## 2015-09-17 ENCOUNTER — Other Ambulatory Visit: Payer: Self-pay | Admitting: Cardiovascular Disease

## 2015-09-22 ENCOUNTER — Other Ambulatory Visit: Payer: Self-pay | Admitting: Cardiovascular Disease

## 2015-09-22 ENCOUNTER — Other Ambulatory Visit (INDEPENDENT_AMBULATORY_CARE_PROVIDER_SITE_OTHER): Payer: Medicare Other

## 2015-09-22 ENCOUNTER — Encounter: Payer: Self-pay | Admitting: Internal Medicine

## 2015-09-22 ENCOUNTER — Ambulatory Visit (INDEPENDENT_AMBULATORY_CARE_PROVIDER_SITE_OTHER)
Admission: RE | Admit: 2015-09-22 | Discharge: 2015-09-22 | Disposition: A | Discharge status: Home / Self Care | Payer: Medicare Other | Source: Ambulatory Visit | Attending: Internal Medicine | Admitting: Internal Medicine

## 2015-09-22 ENCOUNTER — Ambulatory Visit (INDEPENDENT_AMBULATORY_CARE_PROVIDER_SITE_OTHER): Payer: Medicare Other | Admitting: Internal Medicine

## 2015-09-22 VITALS — BP 134/82 | HR 93 | Temp 98.0°F | Resp 16 | Wt 205.0 lb

## 2015-09-22 DIAGNOSIS — I4891 Unspecified atrial fibrillation: Secondary | ICD-10-CM

## 2015-09-22 DIAGNOSIS — R202 Paresthesia of skin: Secondary | ICD-10-CM | POA: Diagnosis not present

## 2015-09-22 DIAGNOSIS — M25461 Effusion, right knee: Secondary | ICD-10-CM | POA: Diagnosis not present

## 2015-09-22 DIAGNOSIS — E782 Mixed hyperlipidemia: Secondary | ICD-10-CM | POA: Diagnosis not present

## 2015-09-22 DIAGNOSIS — R739 Hyperglycemia, unspecified: Secondary | ICD-10-CM

## 2015-09-22 DIAGNOSIS — I10 Essential (primary) hypertension: Secondary | ICD-10-CM

## 2015-09-22 DIAGNOSIS — Z0000 Encounter for general adult medical examination without abnormal findings: Secondary | ICD-10-CM | POA: Diagnosis not present

## 2015-09-22 DIAGNOSIS — I2510 Atherosclerotic heart disease of native coronary artery without angina pectoris: Secondary | ICD-10-CM | POA: Diagnosis not present

## 2015-09-22 DIAGNOSIS — M25561 Pain in right knee: Secondary | ICD-10-CM

## 2015-09-22 DIAGNOSIS — G629 Polyneuropathy, unspecified: Secondary | ICD-10-CM | POA: Insufficient documentation

## 2015-09-22 LAB — CBC WITH DIFFERENTIAL/PLATELET
BASOS PCT: 0.8 % (ref 0.0–3.0)
Basophils Absolute: 0.1 10*3/uL (ref 0.0–0.1)
EOS ABS: 0.5 10*3/uL (ref 0.0–0.7)
EOS PCT: 5.7 % — AB (ref 0.0–5.0)
HEMATOCRIT: 38.9 % (ref 36.0–46.0)
HEMOGLOBIN: 13 g/dL (ref 12.0–15.0)
LYMPHS PCT: 25.6 % (ref 12.0–46.0)
Lymphs Abs: 2.1 10*3/uL (ref 0.7–4.0)
MCHC: 33.5 g/dL (ref 30.0–36.0)
MCV: 88.5 fl (ref 78.0–100.0)
MONO ABS: 0.7 10*3/uL (ref 0.1–1.0)
Monocytes Relative: 9.1 % (ref 3.0–12.0)
Neutro Abs: 4.7 10*3/uL (ref 1.4–7.7)
Neutrophils Relative %: 58.8 % (ref 43.0–77.0)
Platelets: 217 10*3/uL (ref 150.0–400.0)
RBC: 4.39 Mil/uL (ref 3.87–5.11)
RDW: 14.6 % (ref 11.5–15.5)
WBC: 8 10*3/uL (ref 4.0–10.5)

## 2015-09-22 LAB — LIPID PANEL
CHOL/HDL RATIO: 4
CHOLESTEROL: 186 mg/dL (ref 0–200)
HDL: 47.3 mg/dL (ref >39.00)
LDL CALC: 100 mg/dL — AB (ref 0–99)
NONHDL: 138.41
Triglycerides: 191 mg/dL — ABNORMAL HIGH (ref 0.0–149.0)
VLDL: 38.2 mg/dL (ref 0.0–40.0)

## 2015-09-22 LAB — TSH: TSH: 1.81 u[IU]/mL (ref 0.35–4.50)

## 2015-09-22 LAB — COMPREHENSIVE METABOLIC PANEL
ALBUMIN: 4.1 g/dL (ref 3.5–5.2)
ALK PHOS: 76 U/L (ref 39–117)
ALT: 12 U/L (ref 0–35)
AST: 16 U/L (ref 0–37)
BILIRUBIN TOTAL: 0.5 mg/dL (ref 0.2–1.2)
BUN: 13 mg/dL (ref 6–23)
CALCIUM: 9.5 mg/dL (ref 8.4–10.5)
CHLORIDE: 110 meq/L (ref 96–112)
CO2: 26 mEq/L (ref 19–32)
CREATININE: 0.86 mg/dL (ref 0.40–1.20)
GFR: 66.96 mL/min (ref >60.00)
Glucose, Bld: 115 mg/dL — ABNORMAL HIGH (ref 70–99)
Potassium: 4.7 mEq/L (ref 3.5–5.1)
SODIUM: 144 meq/L (ref 135–145)
TOTAL PROTEIN: 7.3 g/dL (ref 6.0–8.3)

## 2015-09-22 LAB — HEMOGLOBIN A1C: HEMOGLOBIN A1C: 6.8 % — AB (ref 4.6–6.5)

## 2015-09-22 LAB — VITAMIN B12: Vitamin B-12: 239 pg/mL (ref 211–911)

## 2015-09-22 MED ORDER — METHOCARBAMOL 500 MG PO TABS: 500.0000 mg | ORAL_TABLET | Freq: Three times a day (TID) | ORAL | Status: DC | PRN

## 2015-09-22 NOTE — Assessment & Plan Note (Signed)
Check lipid panel Currently on pravastatin 80 mg daily

## 2015-09-22 NOTE — Progress Notes (Signed)
Subjective:    Patient ID: Judy [name], female    DOB: 09/20/1930, 82 y.o.   MRN: [id number]  HPI She is here to establish with a new pcp.  Here for medicare wellness exam.   Right knee injury:  Last Saturday she was getting out of the bathrub.  She pulled the knee.  The knee swelled up and was painful in the front of her knee.  She iced the knee, took tylenol and took robaxin, which she had at home.  She needs to lift her leg to cross it on the other leg, but that is not new.  Sometime feels that something is loose inside the knee.  Her pain now is mild and has improved.  She can not put full weight on her leg.  She has seen orthopedics in the past.    B/l foot numbness and tingling:  She has tingling and numbness in her feet and was told that this was likely neuropathy. She wonders what she should take if anything for this. She denies any pain.  Tip of nose peeling: Into her nose has been feeling and assessment.. She was wondering if she should see a dermatologist. She denies any changes in the color of her skin.  I have personally reviewed and have noted 1.The patient's medical and social history 2.Their use of alcohol, tobacco or illicit drugs 3.Their current medications and supplements 4.The patient's functional ability including ADL's, fall risks, home safety risks and hearing or visual impairment. 5.Diet and physical activities 6.Evidence for depression or mood disorders   Are there smokers in your home (other than you)? No  Risk Factors Exercise: no regular exercise Dietary issues discussed: "fair", limited by warfarin-feels limited in eating green vegetables  Cardiac risk factors: advanced age, hypertension, hyperlipidemia, and obesity (BMI >= 30 kg/m2).  Depression Screen  Have you felt down, depressed or hopeless? No  Have you felt little interest or pleasure in doing things?  No Activities of Daily Living In  your present state of health, do you have any difficulty performing the following activities?:  Driving? No, only drives short distances Managing money?  No Feeding yourself? No Getting from bed to chair? No Climbing a flight of stairs? No Preparing food and eating?: No Bathing or showering? No Getting dressed: No Getting to/using the toilet? No Moving around from place to place: No In the past year have you fallen or had a near fall?: yes, recent fall getting out of the tub   Are you sexually active?  No  Do you have more than one partner?  N/A  Hearing Difficulties: No Do you often ask people to speak up or repeat themselves? No Do you experience ringing or noises in your ears? No Do you have difficulty understanding soft or whispered voices? No Vision:              Any change in vision: no , just watery eyes             Up to date with eye exam:  yes Memory:  Do you feel that you have a problem with memory? No  Do you often misplace items? No  Do you feel safe at home?  Yes  Cognitive Testing  Alert, Orientated? Yes  Normal Appearance? Yes  Recall of three objects?  Yes  Can perform simple calculations? Yes  Displays appropriate judgment? Yes  Can read the correct time from a watch face? Yes   Advanced Directives have  been discussed with the patient? Yes  Medications and allergies reviewed with patient and updated if appropriate.  Patient Active Problem List   Diagnosis Date Noted  . Medicare annual wellness visit, subsequent 09/21/2014  . Hyperlipidemia LDL goal <100 09/21/2014  . Encounter for therapeutic drug monitoring 07/25/2013  . Macular degeneration, wet ([place])   . LBBB (left bundle branch block) 03/21/2011  . Long term (current) use of anticoagulants 09/08/2010  . CAROTID ARTERY DISEASE 08/31/2009  . OBESITY 08/23/2009  . MIXED HYPERLIPIDEMIA 12/16/2008  . ESSENTIAL HYPERTENSION, BENIGN 12/16/2008  . CAD, NATIVE VESSEL 12/16/2008  . ATRIAL FIBRILLATION  12/16/2008    Current Outpatient Prescriptions on File Prior to Visit  Medication Sig Dispense Refill  . aspirin 81 MG tablet Take 81 mg by mouth daily.      Marland Kitchen azelastine (ASTELIN) 0.1 % nasal spray Place 2 sprays into the nose 2 (two) times daily as needed for rhinitis or allergies.    Marland Kitchen diltiazem (CARDIZEM CD) 120 MG 24 hr capsule TAKE ONE CAPSULE BY MOUTH NIGHTLY AT BEDTIME 90 capsule 3  . Fexofenadine HCl (ALLEGRA ALLERGY PO) Take 1 tablet by mouth daily.     . furosemide (LASIX) 20 MG tablet TAKE ONE TABLET BY MOUTH ONE TIME DAILY 90 tablet 1  . isosorbide mononitrate (IMDUR) 30 MG 24 hr tablet Take 1 tablet (30 mg total) by mouth at bedtime. 90 tablet 3  . KLOR-CON SPRINKLE 10 MEQ CR capsule TAKE 1 CAPSULE (10 MEQ TOTAL) BY MOUTH DAILY. 90 capsule 0  . lisinopril (PRINIVIL,ZESTRIL) 10 MG tablet TAKE ONE TABLET BY MOUTH ONE TIME DAILY 90 tablet 1  . methocarbamol (ROBAXIN) 500 MG tablet Take 1 tablet (500 mg total) by mouth every 8 (eight) hours as needed for muscle spasms. 30 tablet 0  . metoprolol succinate (TOPROL-XL) 100 MG 24 hr tablet TAKE 1 TABLET BY MOUTH DAILY (KEEP OFFICE VISIT IN APRIL 2017, FOR FUTURE REFILLS) 90 tablet 0  . Multiple Vitamin (MULTIVITAMIN WITH MINERALS) TABS tablet Take 1 tablet by mouth daily.    Marland Kitchen NITROSTAT 0.4 MG SL tablet PLACE 1 TABLET UNDER TONGUE EVERY 5 MINUTES AS NEEDED FOR CHEST PAIN 25 tablet 8  . pravastatin (PRAVACHOL) 80 MG tablet TAKE 1 TABLET (80 MG TOTAL) BY MOUTH AT BEDTIME. 90 tablet 0  . tobramycin (TOBREX) 0.3 % ophthalmic solution Place 1 drop into the left eye See admin instructions. Use for 2 days prior to procedure and day of procedure.    . warfarin (COUMADIN) 2.5 MG tablet Take as directed by anticoagulation clinic 90 tablet 1   No current facility-administered medications on file prior to visit.    Past Medical History  Diagnosis Date  . CAD (coronary artery disease)     a. BMS to RCA 96';  b. Stent PDA and OM DES 2006  .  Atrial fibrillation ([place])     a. chronic anticoag, failed prior DCCV;  b. 03/2010 Echo: EF 55-65%, mod MR, mildly to mod dil LA, mod dil RA, mild to mod MR.  Marland Kitchen HTN (hypertension)   . Hyperlipidemia   . Obesity   . CAROTID ARTERY DISEASE     a. 11/2011 Carotid U/S:  0-39% bilat dzs.  Marland Kitchen LBBB (left bundle branch block)   . Macular degeneration, wet ([place]) 10/2012 dx    L>R, follows with retinal specialist->injections monthly.    Past Surgical History  Procedure Laterality Date  . Coronary stent placement  1996, 2006 x2    Drug-eluting  stent placement to the PDA, drug-eluting stent  placement to the first obtuse  marginal, StarClose closure of the right common femoral arteriotomy site. Successful drug-eluting stent  placement in  both the posterior descending artery and the obtuse marginal. The lesion was directly stented using a 2.5 x 16 mm Taxus deployed at 14 atmospheres.   . Eye surgery      Tear duct stent    Social History   Social History  . Marital Status: Widowed    Spouse Name: N/A  . Number of Children: N/A  . Years of Education: N/A   Social History Main Topics  . Smoking status: Never Smoker   . Smokeless tobacco: Never Used  . Alcohol Use: No  . Drug Use: No  . Sexual Activity: Not Asked   Other Topics Concern  . None   Social History Narrative   Lives in [place] by herself.  She is active around the house w/o symptoms or limitations.    Family History  Problem Relation Age of Onset  . CAD Brother     deceased  . CAD Brother     deceased  . CAD Sister     deceased  . Arthritis Mother     died in her 15's  . Arthritis Father     died in [place].    Review of Systems  Constitutional: Negative for fever, chills, appetite change and fatigue.  HENT: Positive for postnasal drip (from allergies) and rhinorrhea. Negative for hearing loss.   Eyes: Negative for visual disturbance.  Respiratory: Positive for cough (from allergies). Negative for shortness of breath and  wheezing.   Cardiovascular: Positive for chest pain (one episode recently - ? gerd) and palpitations (anxiety or stress only). Negative for leg swelling.  Gastrointestinal: Positive for constipation. Negative for nausea, abdominal pain, diarrhea and blood in stool.       Rare gerd  Genitourinary: Negative for dysuria and hematuria.  Musculoskeletal: Positive for arthralgias (knee pain, mild arthritis) and neck pain (with increased stress). Negative for back pain.  Skin:       Peeling on nose,persistent  Neurological: Positive for numbness (feet). Negative for dizziness, light-headedness and headaches.  Hematological: Bruises/bleeds easily.  Psychiatric/Behavioral: Negative for sleep disturbance and dysphoric mood. The patient is not nervous/anxious.        Objective:   Filed Vitals:   09/22/15 0754  BP: 134/82  Pulse: 93  Temp: 98 F (36.7 C)  Resp: 16   Filed Weights   09/22/15 0754  Weight: 205 lb (92.987 kg)   Body mass index is 35.17 kg/(m^2).   Physical Exam Constitutional: She appears well-developed and well-nourished. No distress.  HENT:  Head: Normocephalic and atraumatic.  Right Ear: External ear normal. Normal ear canal and TM Left Ear: External ear normal.  Normal ear canal and TM Mouth/Throat: Oropharynx is clear and moist.  Eyes: Conjunctivae and EOM are normal.  Neck: Neck supple. No tracheal deviation present. No thyromegaly present.  No carotid bruit  Cardiovascular: Normal rate, regular rhythm and normal heart sounds.   No murmur heard.  trace edema. Pulmonary/Chest: Effort normal and breath sounds normal. No respiratory distress. She has no wheezes. She has no rales.  Abdominal: Soft. She exhibits no distension. There is no tenderness.  Lymphadenopathy: She has no cervical adenopathy.  Musculoskeletal: Mild swelling and ecchymosis right anterior knee. Positive effusion. Knee is slightly tender. Decreased flexion and extension.  No calf tenderness, normal  sensation in Calf. No ecchymosis in  lower leg. Skin: Skin is warm and dry. She is not diaphoretic.  Psychiatric: She has a normal mood and affect. Her behavior is normal.         Assessment & Plan:   Wellness Exam Immunizations Up-to-date except tetanus, which is likely not covered by her insurance. Discussed that she can call her insurance and see if this is covered EKG done by cardiology, up to date Colonoscopy-not indicated at this age 36 indicated at this age dexa-discussed at previous visit and she was not interested in having this done Eye exam-up-to-date Exercise: Not currently exercising regularly. I encouraged her to increase her activity once her knee has healed Encouraged weight loss Hearing loss-no hearing loss. Her daughter states her hearing is excellent Memory concerns/difficulties-no memory concerns by herself or her daughter Independent of ADLs-she only drives short distances and her daughter described her long distances. She is completely independent other than that  Right knee injury/pain: Improving with conservative treatment We will check x-ray Continue to ice and take Tylenol If there is not continued improvement she will see orthopedic She has been taking Robaxin, which seems to be helping. She wondered about Flexeril, which is a smaller pill and easier to take, but I'm concerned that this will increase her risk of falls. Discussed other options, but we will continue to Robaxin since it is working    See Problem List for Assessment and Plan of chronic medical problems.   Follow-up in one year, sooner if needed

## 2015-09-22 NOTE — Patient Instructions (Addendum)
  Judy [name] , Thank you for taking time to come for your Medicare Wellness Visit. I appreciate your ongoing commitment to your health goals. Please review the following plan we discussed and let me know if I can assist you in the future.   These are the goals we discussed: Goals    None      This is a list of the screening recommended for you and due dates:  Health Maintenance  Topic Date Due  . Tetanus Vaccine  08/04/1949  . Flu Shot  01/11/2016  . DEXA scan (bone density measurement)  Addressed  . Shingles Vaccine  Completed  . Pneumonia vaccines  Completed    Test(s) ordered today. Your results will be released to [place] (or called to you) after review, usually within 72hours after test completion. If any changes need to be made, you will be notified at that same time.  All other Health Maintenance issues reviewed.   All recommended immunizations and age-appropriate screenings are up-to-date or discussed.  No immunizations administered today.   Medications reviewed and updated.  No changes recommended at this time.  Your prescription(s) have been submitted to your pharmacy. Please take as directed and contact our office if you believe you are having problem(s) with the medication(s).  Please followup in one year

## 2015-09-22 NOTE — Assessment & Plan Note (Signed)
No angina Encouraged increasing her exercise Following with cardiology Continue current medications

## 2015-09-22 NOTE — Assessment & Plan Note (Signed)
Blood pressure well-controlled Continue current medications Check CMP 

## 2015-09-22 NOTE — Assessment & Plan Note (Signed)
Check A1c. 

## 2015-09-22 NOTE — Progress Notes (Signed)
Pre visit review using our clinic review tool, if applicable. No additional management support is needed unless otherwise documented below in the visit note. 

## 2015-09-22 NOTE — Assessment & Plan Note (Signed)
Possible neuropathy She denies pain in her symptoms are mild No treatment needed at this time Discussed that we can her gabapentin if needed in the future Discussed neurology referral, but she deferred

## 2015-09-23 ENCOUNTER — Other Ambulatory Visit: Payer: Self-pay | Admitting: Cardiovascular Disease

## 2015-09-27 ENCOUNTER — Encounter: Payer: Self-pay | Admitting: Internal Medicine

## 2015-09-27 DIAGNOSIS — E1122 Type 2 diabetes mellitus with diabetic chronic kidney disease: Secondary | ICD-10-CM | POA: Insufficient documentation

## 2015-09-27 DIAGNOSIS — E119 Type 2 diabetes mellitus without complications: Secondary | ICD-10-CM | POA: Insufficient documentation

## 2015-10-08 ENCOUNTER — Ambulatory Visit (INDEPENDENT_AMBULATORY_CARE_PROVIDER_SITE_OTHER): Payer: Medicare Other | Admitting: General Practice

## 2015-10-08 DIAGNOSIS — I4891 Unspecified atrial fibrillation: Secondary | ICD-10-CM | POA: Diagnosis not present

## 2015-10-08 DIAGNOSIS — Z5181 Encounter for therapeutic drug level monitoring: Secondary | ICD-10-CM

## 2015-10-08 LAB — POCT INR: INR: 1.7

## 2015-10-08 NOTE — Progress Notes (Signed)
I have reviewed and agree with the plan. 

## 2015-10-08 NOTE — Progress Notes (Signed)
Pre visit review using our clinic review tool, if applicable. No additional management support is needed unless otherwise documented below in the visit note. 

## 2015-11-01 DIAGNOSIS — H353231 Exudative age-related macular degeneration, bilateral, with active choroidal neovascularization: Secondary | ICD-10-CM | POA: Diagnosis not present

## 2015-11-01 DIAGNOSIS — H35373 Puckering of macula, bilateral: Secondary | ICD-10-CM | POA: Diagnosis not present

## 2015-11-01 DIAGNOSIS — H43813 Vitreous degeneration, bilateral: Secondary | ICD-10-CM | POA: Diagnosis not present

## 2015-11-12 ENCOUNTER — Ambulatory Visit (INDEPENDENT_AMBULATORY_CARE_PROVIDER_SITE_OTHER): Payer: Medicare Other | Admitting: General Practice

## 2015-11-12 DIAGNOSIS — Z5181 Encounter for therapeutic drug level monitoring: Secondary | ICD-10-CM | POA: Diagnosis not present

## 2015-11-12 DIAGNOSIS — I4891 Unspecified atrial fibrillation: Secondary | ICD-10-CM | POA: Diagnosis not present

## 2015-11-12 LAB — POCT INR: INR: 2

## 2015-11-12 NOTE — Progress Notes (Signed)
I have reviewed and agree with the plan. 

## 2015-11-12 NOTE — Progress Notes (Signed)
Pre visit review using our clinic review tool, if applicable. No additional management support is needed unless otherwise documented below in the visit note. 

## 2015-11-21 NOTE — Progress Notes (Signed)
Patient ID: CIRIA KERNER, female   DOB: 1930-09-29, 82 y.o.   MRN: [id number]  82 y.o.  female with prior history of coronary artery disease status post prior stenting of the right coronary artery in 1996 and subsequent stenting of the PDA and OM in 2006. Chronic afib on anticoagulation with rate controll Seen in ER 10/14 for chest pain d/c home r/o Since then, she has done quite well. She lives by herself in [place] and is quite active around her home without symptoms or limitations. She does have macular degeneration requires monthly injections of the left eye and every other monthly injections into the right eye.   09/25/14  1-39% ICA Duplex  Problems with macular degeneration and tear duct on right eye Gets shots by Dr Tye Savoy  Some neuropathy in legs  ROS: Denies fever, malais, weight loss, blurry vision, decreased visual acuity, cough, sputum, SOB, hemoptysis, pleuritic pain, palpitaitons, heartburn, abdominal pain, melena, lower extremity edema, claudication, or rash.  All other systems reviewed and negative  General: Affect appropriate Healthy:  appears stated age 56: normal Neck supple with no adenopathy JVP normal no bruits no thyromegaly Lungs clear with no wheezing and good diaphragmatic motion Heart:  S1/S2 no murmur, no rub, gallop or click PMI normal Abdomen: benighn, BS positve, no tenderness, no AAA no bruit.  No HSM or HJR Distal pulses intact with no bruits No edema Neuro non-focal Skin warm and dry No muscular weakness   Current Outpatient Prescriptions  Medication Sig Dispense Refill  . aspirin 81 MG tablet Take 81 mg by mouth daily.      Marland Kitchen azelastine (ASTELIN) 0.1 % nasal spray Place 2 sprays into the nose 2 (two) times daily as needed for rhinitis or allergies.    Marland Kitchen diltiazem (CARDIZEM CD) 120 MG 24 hr capsule TAKE ONE CAPSULE BY MOUTH NIGHTLY AT BEDTIME 90 capsule 3  . Fexofenadine HCl (ALLEGRA ALLERGY PO) Take 1 tablet by mouth daily.     .  furosemide (LASIX) 20 MG tablet TAKE ONE TABLET BY MOUTH ONE TIME DAILY 90 tablet 1  . furosemide (LASIX) 20 MG tablet TAKE ONE TABLET BY MOUTH ONE TIME DAILY 90 tablet 0  . isosorbide mononitrate (IMDUR) 30 MG 24 hr tablet Take 1 tablet (30 mg total) by mouth at bedtime. 90 tablet 3  . KLOR-CON SPRINKLE 10 MEQ CR capsule TAKE 1 CAPSULE (10 MEQ TOTAL) BY MOUTH DAILY. 90 capsule 0  . lisinopril (PRINIVIL,ZESTRIL) 10 MG tablet TAKE ONE TABLET BY MOUTH ONE TIME DAILY 90 tablet 1  . methocarbamol (ROBAXIN) 500 MG tablet Take 1 tablet (500 mg total) by mouth every 8 (eight) hours as needed for muscle spasms. 30 tablet 0  . metoprolol succinate (TOPROL-XL) 100 MG 24 hr tablet TAKE 1 TABLET BY MOUTH DAILY (KEEP OFFICE VISIT IN APRIL 2017, FOR FUTURE REFILLS) 90 tablet 0  . Multiple Vitamin (MULTIVITAMIN WITH MINERALS) TABS tablet Take 1 tablet by mouth daily.    Marland Kitchen NITROSTAT 0.4 MG SL tablet PLACE 1 TABLET UNDER TONGUE EVERY 5 MINUTES AS NEEDED FOR CHEST PAIN 25 tablet 8  . pravastatin (PRAVACHOL) 80 MG tablet TAKE 1 TABLET (80 MG TOTAL) BY MOUTH AT BEDTIME. 90 tablet 0  . tobramycin (TOBREX) 0.3 % ophthalmic solution Place 1 drop into the left eye See admin instructions. Use for 2 days prior to procedure and day of procedure.    . warfarin (COUMADIN) 2.5 MG tablet Take as directed by anticoagulation clinic 90 tablet 1  No current facility-administered medications for this visit.    Allergies  Review of patient's allergies indicates no known allergies.  Electrocardiogram:  afib low voltage non specific ST chages  10/14  03/09/14  afib rate 72 ? Old IMI  04/28/15 afib rate 83 Old IMI  Assessment and Plan CAD: Stable with no angina and good activity level.  Continue medical Rx Carotid: plaque no stenosis f/u duplex 2 years Afib: chronic good rate control and anticoagulation  Anticoagulation:  No bleeding issues INR Rx  Chol::  Labs wit hprimary  Lab Results  Component Value Date   LDLCALC 100*  09/22/2015      Jenkins Rouge

## 2015-11-22 ENCOUNTER — Ambulatory Visit (INDEPENDENT_AMBULATORY_CARE_PROVIDER_SITE_OTHER): Payer: Medicare Other | Admitting: Cardiovascular Disease

## 2015-11-22 ENCOUNTER — Encounter: Payer: Self-pay | Admitting: Cardiovascular Disease

## 2015-11-22 VITALS — BP 130/70 | HR 68 | Ht 63.0 in | Wt 200.1 lb

## 2015-11-22 DIAGNOSIS — I10 Essential (primary) hypertension: Secondary | ICD-10-CM | POA: Diagnosis not present

## 2015-11-22 DIAGNOSIS — I2510 Atherosclerotic heart disease of native coronary artery without angina pectoris: Secondary | ICD-10-CM

## 2015-11-22 NOTE — Patient Instructions (Signed)

## 2015-11-25 DIAGNOSIS — D485 Neoplasm of uncertain behavior of skin: Secondary | ICD-10-CM | POA: Diagnosis not present

## 2015-11-25 DIAGNOSIS — L821 Other seborrheic keratosis: Secondary | ICD-10-CM | POA: Diagnosis not present

## 2015-11-25 DIAGNOSIS — L570 Actinic keratosis: Secondary | ICD-10-CM | POA: Diagnosis not present

## 2015-11-26 DIAGNOSIS — C44719 Basal cell carcinoma of skin of left lower limb, including hip: Secondary | ICD-10-CM | POA: Diagnosis not present

## 2015-12-10 ENCOUNTER — Ambulatory Visit (INDEPENDENT_AMBULATORY_CARE_PROVIDER_SITE_OTHER): Payer: Medicare Other | Admitting: General Practice

## 2015-12-10 DIAGNOSIS — I4891 Unspecified atrial fibrillation: Secondary | ICD-10-CM

## 2015-12-10 DIAGNOSIS — Z5181 Encounter for therapeutic drug level monitoring: Secondary | ICD-10-CM

## 2015-12-10 LAB — POCT INR: INR: 2

## 2015-12-10 NOTE — Progress Notes (Signed)
I have reviewed and agree with the plan. 

## 2015-12-10 NOTE — Progress Notes (Signed)
Pre visit review using our clinic review tool, if applicable. No additional management support is needed unless otherwise documented below in the visit note. 

## 2015-12-11 ENCOUNTER — Other Ambulatory Visit: Payer: Self-pay | Admitting: Internal Medicine

## 2015-12-15 ENCOUNTER — Other Ambulatory Visit: Payer: Self-pay | Admitting: *Deleted

## 2015-12-15 ENCOUNTER — Other Ambulatory Visit: Payer: Self-pay | Admitting: General Practice

## 2015-12-15 MED ORDER — WARFARIN SODIUM 2.5 MG PO TABS: ORAL_TABLET | ORAL | Status: DC

## 2015-12-15 MED ORDER — PRAVASTATIN SODIUM 80 MG PO TABS: ORAL_TABLET | ORAL | Status: DC

## 2015-12-15 MED ORDER — POTASSIUM CHLORIDE ER 10 MEQ PO CPCR: 10.0000 meq | ORAL_CAPSULE | Freq: Every day | ORAL | Status: DC

## 2016-01-03 ENCOUNTER — Other Ambulatory Visit: Payer: Self-pay | Admitting: Cardiovascular Disease

## 2016-01-03 DIAGNOSIS — H43813 Vitreous degeneration, bilateral: Secondary | ICD-10-CM | POA: Diagnosis not present

## 2016-01-03 DIAGNOSIS — H35373 Puckering of macula, bilateral: Secondary | ICD-10-CM | POA: Diagnosis not present

## 2016-01-03 DIAGNOSIS — H35423 Microcystoid degeneration of retina, bilateral: Secondary | ICD-10-CM | POA: Diagnosis not present

## 2016-01-03 DIAGNOSIS — H353231 Exudative age-related macular degeneration, bilateral, with active choroidal neovascularization: Secondary | ICD-10-CM | POA: Diagnosis not present

## 2016-01-05 ENCOUNTER — Other Ambulatory Visit: Payer: Self-pay | Admitting: Cardiovascular Disease

## 2016-01-07 ENCOUNTER — Ambulatory Visit (INDEPENDENT_AMBULATORY_CARE_PROVIDER_SITE_OTHER): Payer: Medicare Other | Admitting: General Practice

## 2016-01-07 ENCOUNTER — Ambulatory Visit: Payer: Medicare Other

## 2016-01-07 DIAGNOSIS — I4891 Unspecified atrial fibrillation: Secondary | ICD-10-CM

## 2016-01-07 DIAGNOSIS — Z5181 Encounter for therapeutic drug level monitoring: Secondary | ICD-10-CM

## 2016-01-07 LAB — POCT INR: INR: 2.2

## 2016-01-07 NOTE — Progress Notes (Signed)
I have reviewed and agree with the plan. 

## 2016-01-26 ENCOUNTER — Other Ambulatory Visit: Payer: Self-pay | Admitting: Cardiovascular Disease

## 2016-02-02 ENCOUNTER — Encounter: Payer: Self-pay | Admitting: Internal Medicine

## 2016-02-02 ENCOUNTER — Other Ambulatory Visit (INDEPENDENT_AMBULATORY_CARE_PROVIDER_SITE_OTHER): Payer: Medicare Other

## 2016-02-02 ENCOUNTER — Ambulatory Visit (INDEPENDENT_AMBULATORY_CARE_PROVIDER_SITE_OTHER): Payer: Medicare Other | Admitting: Internal Medicine

## 2016-02-02 VITALS — BP 144/80 | HR 84 | Temp 97.4°F | Resp 16 | Wt 196.0 lb

## 2016-02-02 DIAGNOSIS — I10 Essential (primary) hypertension: Secondary | ICD-10-CM

## 2016-02-02 DIAGNOSIS — E119 Type 2 diabetes mellitus without complications: Secondary | ICD-10-CM

## 2016-02-02 DIAGNOSIS — I2510 Atherosclerotic heart disease of native coronary artery without angina pectoris: Secondary | ICD-10-CM

## 2016-02-02 DIAGNOSIS — E782 Mixed hyperlipidemia: Secondary | ICD-10-CM

## 2016-02-02 LAB — COMPREHENSIVE METABOLIC PANEL
ALK PHOS: 86 U/L (ref 39–117)
ALT: 13 U/L (ref 0–35)
AST: 16 U/L (ref 0–37)
Albumin: 4.6 g/dL (ref 3.5–5.2)
BILIRUBIN TOTAL: 0.3 mg/dL (ref 0.2–1.2)
BUN: 21 mg/dL (ref 6–23)
CO2: 26 mEq/L (ref 19–32)
Calcium: 9.6 mg/dL (ref 8.4–10.5)
Chloride: 109 mEq/L (ref 96–112)
Creatinine, Ser: 0.99 mg/dL (ref 0.40–1.20)
GFR: 56.87 mL/min — ABNORMAL LOW (ref >60.00)
GLUCOSE: 110 mg/dL — AB (ref 70–99)
POTASSIUM: 4.8 meq/L (ref 3.5–5.1)
Sodium: 140 mEq/L (ref 135–145)
TOTAL PROTEIN: 7.8 g/dL (ref 6.0–8.3)

## 2016-02-02 LAB — HEMOGLOBIN A1C: Hgb A1c MFr Bld: 6.4 % (ref 4.6–6.5)

## 2016-02-02 NOTE — Patient Instructions (Addendum)

## 2016-02-02 NOTE — Assessment & Plan Note (Signed)
Continue pravastatin 80 mg daily.  

## 2016-02-02 NOTE — Progress Notes (Signed)
Subjective:    Patient ID: Judy [name], female    DOB: [birth year], 82 y.o.   MRN: [id number]  HPI The patient is here for follow up.  Hypertension: She is taking her medication daily. She is compliant with a low sodium diet.  She denies chest pain, palpitations, edema, shortness of breath and regular headaches. She is active, but not exercising regularly.  She does not monitor her blood pressure at home.    Hyperlipidemia: She is taking her medication daily. She is compliant with a low fat/cholesterol diet. She is not exercising regularly. She denies myalgias.   Diabetes:  This is a new diagnosis.  She has made some changes in her diet and has lost some weight.  She has decreased her sugars and carbohydrates. She is active, but exercising regularly.   Medications and allergies reviewed with patient and updated if appropriate.  Patient Active Problem List   Diagnosis Date Noted  . Diabetes ([place]) 09/27/2015  . Numbness and tingling of foot 09/22/2015  . Encounter for therapeutic drug monitoring 07/25/2013  . Macular degeneration, wet ([place])   . LBBB (left bundle branch block) 03/21/2011  . Long term (current) use of anticoagulants 09/08/2010  . CAROTID ARTERY DISEASE 08/31/2009  . OBESITY 08/23/2009  . MIXED HYPERLIPIDEMIA 12/16/2008  . ESSENTIAL HYPERTENSION, BENIGN 12/16/2008  . CAD, NATIVE VESSEL 12/16/2008  . ATRIAL FIBRILLATION 12/16/2008    Current Outpatient Prescriptions on File Prior to Visit  Medication Sig Dispense Refill  . aspirin 81 MG tablet Take 81 mg by mouth daily.      Marland Kitchen azelastine (ASTELIN) 0.1 % nasal spray Place 2 sprays into the nose 2 (two) times daily as needed for rhinitis or allergies.    Marland Kitchen diltiazem (CARDIZEM CD) 120 MG 24 hr capsule TAKE ONE CAPSULE BY MOUTH NIGHTLY AT BEDTIME 90 capsule 3  . Fexofenadine HCl (ALLEGRA ALLERGY PO) Take 1 tablet by mouth daily.     . furosemide (LASIX) 20 MG tablet TAKE ONE TABLET BY MOUTH ONE TIME DAILY 90 tablet  2  . isosorbide mononitrate (IMDUR) 30 MG 24 hr tablet Take 1 tablet (30 mg total) by mouth at bedtime. 90 tablet 3  . lisinopril (PRINIVIL,ZESTRIL) 10 MG tablet TAKE ONE TABLET BY MOUTH ONE TIME DAILY. NEEDS OFFICE VISIT FOR FUTURE REFILLS. 90 tablet 1  . methocarbamol (ROBAXIN) 500 MG tablet Take 1 tablet (500 mg total) by mouth every 8 (eight) hours as needed for muscle spasms. 30 tablet 0  . metoprolol succinate (TOPROL-XL) 100 MG 24 hr tablet Take 1 tablet (100 mg total) by mouth daily. 90 tablet 3  . Multiple Vitamin (MULTIVITAMIN WITH MINERALS) TABS tablet Take 1 tablet by mouth daily.    Marland Kitchen NITROSTAT 0.4 MG SL tablet PLACE 1 TABLET UNDER TONGUE EVERY 5 MINUTES AS NEEDED FOR CHEST PAIN 25 tablet 8  . potassium chloride (KLOR-CON SPRINKLE) 10 MEQ CR capsule Take 1 capsule (10 mEq total) by mouth daily. 90 capsule 3  . pravastatin (PRAVACHOL) 80 MG tablet TAKE 1 TABLET (80 MG TOTAL) BY MOUTH AT BEDTIME. 90 tablet 3  . tobramycin (TOBREX) 0.3 % ophthalmic solution Place 1 drop into the left eye See admin instructions. Use for 2 days prior to procedure and day of procedure.    . warfarin (COUMADIN) 2.5 MG tablet Take as directed by anticoagulation clinic 90 tablet 1   No current facility-administered medications on file prior to visit.     Past Medical History:  Diagnosis Date  .  Atrial fibrillation ([place])    a. chronic anticoag, failed prior DCCV;  b. 03/2010 Echo: EF 55-65%, mod MR, mildly to mod dil LA, mod dil RA, mild to mod MR.  Marland Kitchen CAD (coronary artery disease)    a. BMS to RCA 96';  b. Stent PDA and OM DES 2006  . CAROTID ARTERY DISEASE    a. 11/2011 Carotid U/S:  0-39% bilat dzs.  Marland Kitchen HTN (hypertension)   . Hyperlipidemia   . LBBB (left bundle branch block)   . Macular degeneration, wet ([place]) 10/2012 dx   L>R, follows with retinal specialist->injections monthly.  . Obesity     Past Surgical History:  Procedure Laterality Date  . [place], 2006 x2    Drug-eluting stent placement to the PDA, drug-eluting stent  placement to the first obtuse  marginal, StarClose closure of the right common femoral arteriotomy site. Successful drug-eluting stent  placement in  both the posterior descending artery and the obtuse marginal. The lesion was directly stented using a 2.5 x 16 mm Taxus deployed at 14 atmospheres.   Marland Kitchen EYE SURGERY     Tear duct stent    Social History   Social History  . Marital status: Widowed    Spouse name: N/A  . Number of children: N/A  . Years of education: N/A   Social History Main Topics  . Smoking status: Never Smoker  . Smokeless tobacco: Never Used  . Alcohol use No  . Drug use: No  . Sexual activity: Not Asked   Other Topics Concern  . None   Social History Narrative   Lives in [place] by herself.  She is active around the house w/o symptoms or limitations.    Family History  Problem Relation Age of Onset  . CAD Brother     deceased  . CAD Brother     deceased  . CAD Sister     deceased  . Arthritis Mother     died in her 70's  . Arthritis Father     died in [place].    Review of Systems  Constitutional: Negative for fever.  Eyes: Negative for visual disturbance.  Respiratory: Negative for cough, shortness of breath and wheezing.   Cardiovascular: Negative for chest pain, palpitations and leg swelling.  Endocrine: Negative for polydipsia and polyuria.  Neurological: Positive for numbness (toes). Negative for dizziness, light-headedness and headaches.       Objective:   Vitals:   02/02/16 1315  BP: (!) 144/80  Pulse: 84  Resp: 16  Temp: 97.4 F (36.3 C)   Filed Weights   02/02/16 1315  Weight: 196 lb (88.9 kg)   Body mass index is 34.72 kg/m.   Physical Exam    Constitutional: Appears well-developed and well-nourished. No distress.  HENT:  Head: Normocephalic and atraumatic.  Neck: Neck supple. No tracheal deviation present. No thyromegaly present.  Cardiovascular: Normal rate,  irregular rhythm and normal heart sounds.   No murmur heard. No carotid bruit  Pulmonary/Chest: Effort normal and breath sounds normal. No respiratory distress. No has no wheezes. No rales.  Musculoskeletal: No edema.  Lymphadenopathy: No cervical adenopathy.  Skin: Skin is warm and dry. Not diaphoretic.  Psychiatric: Normal mood and affect. Behavior is normal.     Assessment & Plan:    See Problem List for Assessment and Plan of chronic medical problems.

## 2016-02-02 NOTE — Progress Notes (Signed)
Pre visit review using our clinic review tool, if applicable. No additional management support is needed unless otherwise documented below in the visit note. 

## 2016-02-02 NOTE — Assessment & Plan Note (Signed)
BP well controlled Current regimen effective and well tolerated Continue current medications at current doses  

## 2016-02-02 NOTE — Assessment & Plan Note (Addendum)
New since her last visit Discussed etiology and consequences of diabetes that is not controlled Discussed treatment Discussed the importance of low sugar, low carbohydrate diet-she is already made changes Discussed the importance of regular exercise and weight loss-she has lost weight since she was here last No medication needed at this time Will monitor every 6 months Check A1c today Deferred Nutrition referral Discussed glucometer-deferred today

## 2016-02-18 ENCOUNTER — Ambulatory Visit (INDEPENDENT_AMBULATORY_CARE_PROVIDER_SITE_OTHER): Payer: Medicare Other | Admitting: General Practice

## 2016-02-18 DIAGNOSIS — I4891 Unspecified atrial fibrillation: Secondary | ICD-10-CM | POA: Diagnosis not present

## 2016-02-18 DIAGNOSIS — Z23 Encounter for immunization: Secondary | ICD-10-CM | POA: Diagnosis not present

## 2016-02-18 DIAGNOSIS — Z5181 Encounter for therapeutic drug level monitoring: Secondary | ICD-10-CM | POA: Diagnosis not present

## 2016-02-18 LAB — POCT INR: INR: 2

## 2016-02-18 NOTE — Progress Notes (Signed)
I have reviewed and agree with the plan. 

## 2016-02-23 DIAGNOSIS — C44719 Basal cell carcinoma of skin of left lower limb, including hip: Secondary | ICD-10-CM | POA: Diagnosis not present

## 2016-03-07 DIAGNOSIS — H35372 Puckering of macula, left eye: Secondary | ICD-10-CM | POA: Diagnosis not present

## 2016-03-07 DIAGNOSIS — H43813 Vitreous degeneration, bilateral: Secondary | ICD-10-CM | POA: Diagnosis not present

## 2016-03-07 DIAGNOSIS — H35423 Microcystoid degeneration of retina, bilateral: Secondary | ICD-10-CM | POA: Diagnosis not present

## 2016-03-07 DIAGNOSIS — H353231 Exudative age-related macular degeneration, bilateral, with active choroidal neovascularization: Secondary | ICD-10-CM | POA: Diagnosis not present

## 2016-03-17 ENCOUNTER — Ambulatory Visit: Payer: Medicare Other | Admitting: Nurse Practitioner

## 2016-03-17 DIAGNOSIS — M1711 Unilateral primary osteoarthritis, right knee: Secondary | ICD-10-CM | POA: Diagnosis not present

## 2016-03-17 DIAGNOSIS — M25561 Pain in right knee: Secondary | ICD-10-CM | POA: Diagnosis not present

## 2016-03-24 ENCOUNTER — Ambulatory Visit (INDEPENDENT_AMBULATORY_CARE_PROVIDER_SITE_OTHER): Payer: Medicare Other | Admitting: General Practice

## 2016-03-24 DIAGNOSIS — Z5181 Encounter for therapeutic drug level monitoring: Secondary | ICD-10-CM | POA: Diagnosis not present

## 2016-03-24 LAB — POCT INR: INR: 2.3

## 2016-03-24 NOTE — Progress Notes (Signed)
I have reviewed and agree with the plan. 

## 2016-03-30 DIAGNOSIS — L570 Actinic keratosis: Secondary | ICD-10-CM | POA: Diagnosis not present

## 2016-03-30 DIAGNOSIS — Z23 Encounter for immunization: Secondary | ICD-10-CM | POA: Diagnosis not present

## 2016-03-30 DIAGNOSIS — Z85828 Personal history of other malignant neoplasm of skin: Secondary | ICD-10-CM | POA: Diagnosis not present

## 2016-04-17 DIAGNOSIS — M1711 Unilateral primary osteoarthritis, right knee: Secondary | ICD-10-CM | POA: Diagnosis not present

## 2016-04-28 ENCOUNTER — Ambulatory Visit (INDEPENDENT_AMBULATORY_CARE_PROVIDER_SITE_OTHER): Payer: Medicare Other | Admitting: General Practice

## 2016-04-28 DIAGNOSIS — Z5181 Encounter for therapeutic drug level monitoring: Secondary | ICD-10-CM

## 2016-04-28 LAB — POCT INR: INR: 2.2

## 2016-04-28 NOTE — Patient Instructions (Signed)
Pre visit review using our clinic review tool, if applicable. No additional management support is needed unless otherwise documented below in the visit note. 

## 2016-04-28 NOTE — Progress Notes (Signed)
I have reviewed and agree with the plan. 

## 2016-05-09 DIAGNOSIS — H353231 Exudative age-related macular degeneration, bilateral, with active choroidal neovascularization: Secondary | ICD-10-CM | POA: Diagnosis not present

## 2016-05-09 DIAGNOSIS — H43813 Vitreous degeneration, bilateral: Secondary | ICD-10-CM | POA: Diagnosis not present

## 2016-05-09 DIAGNOSIS — H35372 Puckering of macula, left eye: Secondary | ICD-10-CM | POA: Diagnosis not present

## 2016-05-09 DIAGNOSIS — H35423 Microcystoid degeneration of retina, bilateral: Secondary | ICD-10-CM | POA: Diagnosis not present

## 2016-05-24 ENCOUNTER — Other Ambulatory Visit: Payer: Self-pay | Admitting: Internal Medicine

## 2016-05-31 ENCOUNTER — Ambulatory Visit (INDEPENDENT_AMBULATORY_CARE_PROVIDER_SITE_OTHER): Payer: Medicare Other | Admitting: General Practice

## 2016-05-31 DIAGNOSIS — Z5181 Encounter for therapeutic drug level monitoring: Secondary | ICD-10-CM

## 2016-05-31 LAB — POCT INR: INR: 2.1

## 2016-05-31 NOTE — Progress Notes (Signed)
I have reviewed and agree with the plan. 

## 2016-06-29 ENCOUNTER — Other Ambulatory Visit: Payer: Self-pay | Admitting: Cardiovascular Disease

## 2016-06-29 NOTE — Progress Notes (Signed)
Patient ID: Judy [name], female   DOB: August 30, 1930, 83 y.o.   MRN: [id number]  83 y.o.  female with prior history of coronary artery disease status post prior stenting of the right coronary artery in 1996 and subsequent stenting of the PDA and OM in 2006. Chronic afib on anticoagulation with rate controll Seen in ER 10/14 for chest pain d/c home r/o Since then, she has done quite well. She lives by herself in [place] and is quite active around her home without symptoms or limitations. She does have macular degeneration requires monthly injections of the left eye and every other monthly injections into the right eye.   09/25/14  1-39% ICA Duplex  Problems with macular degeneration and tear duct on right eye Gets shots by Dr Tye Savoy   Some neuropathy in legs  Had [place] surgeon remove lesion from left leg while on coumadin   ROS: Denies fever, malais, weight loss, blurry vision, decreased visual acuity, cough, sputum, SOB, hemoptysis, pleuritic pain, palpitaitons, heartburn, abdominal pain, melena, lower extremity edema, claudication, or rash.  All other systems reviewed and negative  General: Affect appropriate Healthy:  appears stated age 57: normal Neck supple with no adenopathy JVP normal no bruits no thyromegaly Lungs clear with no wheezing and good diaphragmatic motion Heart:  S1/S2 no murmur, no rub, gallop or click PMI normal Abdomen: benighn, BS positve, no tenderness, no AAA no bruit.  No HSM or HJR Distal pulses intact with no bruits No edema Neuro non-focal Skin warm and dry scar LLE from skin cancer  No muscular weakness   Current Outpatient Prescriptions  Medication Sig Dispense Refill  . aspirin 81 MG tablet Take 81 mg by mouth daily.      Marland Kitchen azelastine (ASTELIN) 0.1 % nasal spray Place 2 sprays into the nose 2 (two) times daily as needed for rhinitis or allergies.    Marland Kitchen diltiazem (CARDIZEM CD) 120 MG 24 hr capsule TAKE ONE CAPSULE BY MOUTH NIGHTLY AT BEDTIME 90  capsule 3  . Fexofenadine HCl (ALLEGRA ALLERGY PO) Take 1 tablet by mouth daily.     . furosemide (LASIX) 20 MG tablet TAKE ONE TABLET BY MOUTH ONE TIME DAILY 90 tablet 2  . isosorbide mononitrate (IMDUR) 30 MG 24 hr tablet Take 1 tablet (30 mg total) by mouth at bedtime. 90 tablet 3  . lisinopril (PRINIVIL,ZESTRIL) 10 MG tablet TAKE ONE TABLET BY MOUTH ONE TIME DAILY. NEEDS OFFICE VISIT FOR FUTURE REFILLS. 90 tablet 1  . methocarbamol (ROBAXIN) 500 MG tablet Take 1 tablet (500 mg total) by mouth every 8 (eight) hours as needed for muscle spasms. 30 tablet 0  . metoprolol succinate (TOPROL-XL) 100 MG 24 hr tablet Take 1 tablet (100 mg total) by mouth daily. 90 tablet 3  . Multiple Vitamin (MULTIVITAMIN WITH MINERALS) TABS tablet Take 1 tablet by mouth daily.    Marland Kitchen NITROSTAT 0.4 MG SL tablet PLACE 1 TABLET UNDER TONGUE EVERY 5 MINUTES AS NEEDED FOR CHEST PAIN 25 tablet 8  . potassium chloride (KLOR-CON SPRINKLE) 10 MEQ CR capsule Take 1 capsule (10 mEq total) by mouth daily. 90 capsule 3  . pravastatin (PRAVACHOL) 80 MG tablet TAKE 1 TABLET (80 MG TOTAL) BY MOUTH AT BEDTIME. 90 tablet 3  . tobramycin (TOBREX) 0.3 % ophthalmic solution Place 1 drop into the left eye See admin instructions. Use for 2 days prior to procedure and day of procedure.    . warfarin (COUMADIN) 2.5 MG tablet TAKE AS DIRECTED BY ANTICOAGULATION CLINIC  90 tablet 1   No current facility-administered medications for this visit.     Allergies  Patient has no known allergies.  Electrocardiogram:  afib low voltage non specific ST chages  10/14  03/09/14  afib rate 72 ? Old IMI  04/28/15 afib rate 74 Old IMI 06/30/16 afib rate 58 old IMI   Assessment and Plan CAD: Stable with no angina and good activity level.  Continue medical Rx Carotid: plaque no stenosis f/u duplex 09/2016 Afib: chronic good rate control and anticoagulation  Anticoagulation:  No bleeding issues INR Rx gets checked every 6 weeks  Chol::  Labs wit hprimary   Lab Results  Component Value Date   LDLCALC 100 (H) 09/22/2015      Jenkins Rouge

## 2016-06-30 ENCOUNTER — Encounter: Payer: Self-pay | Admitting: Cardiovascular Disease

## 2016-06-30 ENCOUNTER — Encounter (INDEPENDENT_AMBULATORY_CARE_PROVIDER_SITE_OTHER): Payer: Self-pay

## 2016-06-30 ENCOUNTER — Ambulatory Visit (INDEPENDENT_AMBULATORY_CARE_PROVIDER_SITE_OTHER): Payer: Medicare Other | Admitting: Cardiovascular Disease

## 2016-06-30 VITALS — BP 132/84 | HR 100 | Ht 65.0 in | Wt 196.4 lb

## 2016-06-30 DIAGNOSIS — I10 Essential (primary) hypertension: Secondary | ICD-10-CM

## 2016-06-30 NOTE — Patient Instructions (Signed)

## 2016-07-02 ENCOUNTER — Other Ambulatory Visit: Payer: Self-pay | Admitting: Cardiovascular Disease

## 2016-07-03 NOTE — Telephone Encounter (Signed)
Medication Detail    Disp Refills Start End   lisinopril (PRINIVIL,ZESTRIL) 10 MG tablet 90 tablet 3 06/30/2016    Sig - Route: Take 1 tablet (10 mg total) by mouth daily. - Oral   E-Prescribing Status: Receipt confirmed by pharmacy (06/30/2016 3:26 PM EST)   Pharmacy   [facility] [place], [address]

## 2016-07-05 ENCOUNTER — Ambulatory Visit (INDEPENDENT_AMBULATORY_CARE_PROVIDER_SITE_OTHER): Payer: Medicare Other | Admitting: General Practice

## 2016-07-05 DIAGNOSIS — Z5181 Encounter for therapeutic drug level monitoring: Secondary | ICD-10-CM

## 2016-07-05 DIAGNOSIS — I4891 Unspecified atrial fibrillation: Secondary | ICD-10-CM

## 2016-07-05 LAB — POCT INR: INR: 2.2

## 2016-07-05 NOTE — Patient Instructions (Signed)
Pre visit review using our clinic review tool, if applicable. No additional management support is needed unless otherwise documented below in the visit note. 

## 2016-07-11 DIAGNOSIS — H35423 Microcystoid degeneration of retina, bilateral: Secondary | ICD-10-CM | POA: Diagnosis not present

## 2016-07-11 DIAGNOSIS — H35372 Puckering of macula, left eye: Secondary | ICD-10-CM | POA: Diagnosis not present

## 2016-07-11 DIAGNOSIS — H353231 Exudative age-related macular degeneration, bilateral, with active choroidal neovascularization: Secondary | ICD-10-CM | POA: Diagnosis not present

## 2016-07-11 DIAGNOSIS — H43813 Vitreous degeneration, bilateral: Secondary | ICD-10-CM | POA: Diagnosis not present

## 2016-07-26 ENCOUNTER — Other Ambulatory Visit: Payer: Self-pay | Admitting: Internal Medicine

## 2016-08-02 DIAGNOSIS — Z23 Encounter for immunization: Secondary | ICD-10-CM | POA: Diagnosis not present

## 2016-08-02 DIAGNOSIS — L821 Other seborrheic keratosis: Secondary | ICD-10-CM | POA: Diagnosis not present

## 2016-08-02 DIAGNOSIS — Z85828 Personal history of other malignant neoplasm of skin: Secondary | ICD-10-CM | POA: Diagnosis not present

## 2016-08-02 DIAGNOSIS — D1800 Hemangioma unspecified site: Secondary | ICD-10-CM | POA: Diagnosis not present

## 2016-08-02 DIAGNOSIS — Z808 Family history of malignant neoplasm of other organs or systems: Secondary | ICD-10-CM | POA: Diagnosis not present

## 2016-08-02 DIAGNOSIS — K130 Diseases of lips: Secondary | ICD-10-CM | POA: Diagnosis not present

## 2016-08-06 NOTE — Progress Notes (Signed)
Subjective:    Patient ID: Judy [name], female    DOB: 04-16-1931, 83 y.o.   MRN: [id number]  HPI The patient is here for follow up.  Diabetes: She is not on any medication.  She was diagnosed with diabetes last year.  She is compliant with a diabetic diet - except this week.  She is not exercising regularly.  She checks her feet daily and denies foot lesions. She is up-to-date with an ophthalmology examination.   Neuropathy:  The tingling/numbness in her feet is worse.  She denies pain.  she is unsure if the symptoms have gone to the top of her feet or legs.  She denies numbness/tingling in her hands. The symptoms are annoying, but do not affect her sleep.  She feels her balance is off a little.  She feels less flexible.     CAD, Afib, Hypertension: She is taking her medication daily. She is compliant with a low sodium diet.  She denies chest pain, palpitations, shortness of breath and regular headaches. She is not exercising regularly.  She does not monitor her blood pressure at home.    Hyperlipidemia: She is taking her medication daily. She is compliant with a low fat/cholesterol diet. She is not exercising regularly. She denies myalgias.    Medications and allergies reviewed with patient and updated if appropriate.  Patient Active Problem List   Diagnosis Date Noted  . Diabetes ([place]) 09/27/2015  . Numbness and tingling of foot 09/22/2015  . Encounter for therapeutic drug monitoring 07/25/2013  . Macular degeneration, wet ([place])   . LBBB (left bundle branch block) 03/21/2011  . Long term (current) use of anticoagulants 09/08/2010  . CAROTID ARTERY DISEASE 08/31/2009  . OBESITY 08/23/2009  . Mixed hyperlipidemia 12/16/2008  . Essential hypertension, benign 12/16/2008  . CAD, NATIVE VESSEL 12/16/2008  . ATRIAL FIBRILLATION 12/16/2008    Current Outpatient Prescriptions on File Prior to Visit  Medication Sig Dispense Refill  . aspirin 81 MG tablet Take 81 mg by mouth  daily.      Marland Kitchen azelastine (ASTELIN) 0.1 % nasal spray Place 2 sprays into the nose 2 (two) times daily as needed for rhinitis or allergies.    Marland Kitchen diltiazem (CARDIZEM CD) 120 MG 24 hr capsule TAKE ONE CAPSULE BY MOUTH NIGHTLY AT BEDTIME 90 capsule 3  . Fexofenadine HCl (ALLEGRA ALLERGY PO) Take 1 tablet by mouth daily.     . furosemide (LASIX) 20 MG tablet TAKE ONE TABLET BY MOUTH ONE TIME DAILY 90 tablet 2  . isosorbide mononitrate (IMDUR) 30 MG 24 hr tablet Take 1 tablet (30 mg total) by mouth at bedtime. 90 tablet 3  . lisinopril (PRINIVIL,ZESTRIL) 10 MG tablet Take 1 tablet (10 mg total) by mouth daily. 90 tablet 3  . methocarbamol (ROBAXIN) 500 MG tablet TAKE 1 TABLET BY MOUTH EVERY 8 HOURS AS NEEDED FOR MUSCLE SPASM 30 tablet 0  . metoprolol succinate (TOPROL-XL) 100 MG 24 hr tablet Take 1 tablet (100 mg total) by mouth daily. 90 tablet 3  . Multiple Vitamin (MULTIVITAMIN WITH MINERALS) TABS tablet Take 1 tablet by mouth daily.    Marland Kitchen NITROSTAT 0.4 MG SL tablet PLACE 1 TABLET UNDER TONGUE EVERY 5 MINUTES AS NEEDED FOR CHEST PAIN 25 tablet 8  . potassium chloride (KLOR-CON SPRINKLE) 10 MEQ CR capsule Take 1 capsule (10 mEq total) by mouth daily. 90 capsule 3  . pravastatin (PRAVACHOL) 80 MG tablet TAKE 1 TABLET (80 MG TOTAL) BY MOUTH AT BEDTIME.  90 tablet 3  . warfarin (COUMADIN) 2.5 MG tablet TAKE AS DIRECTED BY ANTICOAGULATION CLINIC 90 tablet 1   No current facility-administered medications on file prior to visit.     Past Medical History:  Diagnosis Date  . Atrial fibrillation ([place])    a. chronic anticoag, failed prior DCCV;  b. 03/2010 Echo: EF 55-65%, mod MR, mildly to mod dil LA, mod dil RA, mild to mod MR.  Marland Kitchen CAD (coronary artery disease)    a. BMS to RCA 96';  b. Stent PDA and OM DES 2006  . CAROTID ARTERY DISEASE    a. 11/2011 Carotid U/S:  0-39% bilat dzs.  Marland Kitchen HTN (hypertension)   . Hyperlipidemia   . LBBB (left bundle branch block)   . Macular degeneration, wet ([place]) 10/2012 dx     L>R, follows with retinal specialist->injections monthly.  . Obesity     Past Surgical History:  Procedure Laterality Date  . [place], 2006 x2   Drug-eluting stent placement to the PDA, drug-eluting stent  placement to the first obtuse  marginal, StarClose closure of the right common femoral arteriotomy site. Successful drug-eluting stent  placement in  both the posterior descending artery and the obtuse marginal. The lesion was directly stented using a 2.5 x 16 mm Taxus deployed at 14 atmospheres.   Marland Kitchen EYE SURGERY     Tear duct stent    Social History   Social History  . Marital status: Widowed    Spouse name: N/A  . Number of children: N/A  . Years of education: N/A   Social History Main Topics  . Smoking status: Never Smoker  . Smokeless tobacco: Never Used  . Alcohol use No  . Drug use: No  . Sexual activity: Not on file   Other Topics Concern  . Not on file   Social History Narrative   Lives in [place] by herself.  She is active around the house w/o symptoms or limitations.    Family History  Problem Relation Age of Onset  . CAD Brother     deceased  . CAD Brother     deceased  . CAD Sister     deceased  . Arthritis Mother     died in her 73's  . Arthritis Father     died in [place].    Review of Systems  Constitutional: Negative for chills and fever.  Respiratory: Negative for cough, shortness of breath and wheezing.   Cardiovascular: Positive for palpitations (rare) and leg swelling (mild, left leg). Negative for chest pain.  Neurological: Positive for numbness. Negative for dizziness, light-headedness and headaches.       Objective:   Vitals:   08/07/16 1035  BP: (!) 152/86  Pulse: 88  Resp: 16  Temp: 98 F (36.7 C)   Wt Readings from Last 3 Encounters:  08/07/16 197 lb (89.4 kg)  06/30/16 196 lb 6.4 oz (89.1 kg)  02/02/16 196 lb (88.9 kg)   Body mass index is 32.78 kg/m.   Physical Exam    Constitutional: Appears  well-developed and well-nourished. No distress.  HENT:  Head: Normocephalic and atraumatic.  Neck: Neck supple. No tracheal deviation present. No thyromegaly present.  No cervical lymphadenopathy Cardiovascular: Normal rate, irregular rhythm and normal heart sounds.   No murmur heard. No carotid bruit .  Trace left leg edema Pulmonary/Chest: Effort normal and breath sounds normal. No respiratory distress. No has no wheezes. No rales.  Skin: Skin is warm  and dry. Not diaphoretic.  Psychiatric: Normal mood and affect. Behavior is normal.      Assessment & Plan:    See Problem List for Assessment and Plan of chronic medical problems.   FU 6 months

## 2016-08-07 ENCOUNTER — Encounter: Payer: Self-pay | Admitting: Internal Medicine

## 2016-08-07 ENCOUNTER — Other Ambulatory Visit (INDEPENDENT_AMBULATORY_CARE_PROVIDER_SITE_OTHER): Payer: Medicare Other

## 2016-08-07 ENCOUNTER — Ambulatory Visit (INDEPENDENT_AMBULATORY_CARE_PROVIDER_SITE_OTHER): Payer: Medicare Other | Admitting: Internal Medicine

## 2016-08-07 VITALS — BP 152/86 | HR 88 | Temp 98.0°F | Resp 16 | Wt 197.0 lb

## 2016-08-07 DIAGNOSIS — E119 Type 2 diabetes mellitus without complications: Secondary | ICD-10-CM | POA: Diagnosis not present

## 2016-08-07 DIAGNOSIS — I10 Essential (primary) hypertension: Secondary | ICD-10-CM | POA: Diagnosis not present

## 2016-08-07 DIAGNOSIS — R202 Paresthesia of skin: Secondary | ICD-10-CM | POA: Diagnosis not present

## 2016-08-07 DIAGNOSIS — E782 Mixed hyperlipidemia: Secondary | ICD-10-CM

## 2016-08-07 DIAGNOSIS — R200 Anesthesia of skin: Secondary | ICD-10-CM | POA: Diagnosis not present

## 2016-08-07 LAB — CBC WITH DIFFERENTIAL/PLATELET
BASOS ABS: 0.1 10*3/uL (ref 0.0–0.1)
Basophils Relative: 0.8 % (ref 0.0–3.0)
EOS ABS: 0.2 10*3/uL (ref 0.0–0.7)
Eosinophils Relative: 3 % (ref 0.0–5.0)
HEMATOCRIT: 42.4 % (ref 36.0–46.0)
HEMOGLOBIN: 14.2 g/dL (ref 12.0–15.0)
LYMPHS PCT: 30.7 % (ref 12.0–46.0)
Lymphs Abs: 2.5 10*3/uL (ref 0.7–4.0)
MCHC: 33.5 g/dL (ref 30.0–36.0)
MCV: 90.3 fl (ref 78.0–100.0)
MONOS PCT: 9.4 % (ref 3.0–12.0)
Monocytes Absolute: 0.8 10*3/uL (ref 0.1–1.0)
Neutro Abs: 4.6 10*3/uL (ref 1.4–7.7)
Neutrophils Relative %: 56.1 % (ref 43.0–77.0)
PLATELETS: 235 10*3/uL (ref 150.0–400.0)
RBC: 4.7 Mil/uL (ref 3.87–5.11)
RDW: 13.5 % (ref 11.5–15.5)
WBC: 8.2 10*3/uL (ref 4.0–10.5)

## 2016-08-07 LAB — HEMOGLOBIN A1C: Hgb A1c MFr Bld: 6.5 % (ref 4.6–6.5)

## 2016-08-07 LAB — COMPREHENSIVE METABOLIC PANEL
ALBUMIN: 4.2 g/dL (ref 3.5–5.2)
ALK PHOS: 68 U/L (ref 39–117)
ALT: 15 U/L (ref 0–35)
AST: 18 U/L (ref 0–37)
BILIRUBIN TOTAL: 0.4 mg/dL (ref 0.2–1.2)
BUN: 13 mg/dL (ref 6–23)
CALCIUM: 9.7 mg/dL (ref 8.4–10.5)
CO2: 26 mEq/L (ref 19–32)
CREATININE: 0.92 mg/dL (ref 0.40–1.20)
Chloride: 107 mEq/L (ref 96–112)
GFR: 61.81 mL/min (ref >60.00)
Glucose, Bld: 84 mg/dL (ref 70–99)
Potassium: 4.2 mEq/L (ref 3.5–5.1)
Sodium: 142 mEq/L (ref 135–145)
Total Protein: 7.2 g/dL (ref 6.0–8.3)

## 2016-08-07 LAB — VITAMIN B12: VITAMIN B 12: 577 pg/mL (ref 211–911)

## 2016-08-07 MED ORDER — B COMPLEX PO TABS: 1.0000 | ORAL_TABLET | Freq: Every day | ORAL | Status: AC

## 2016-08-07 NOTE — Assessment & Plan Note (Signed)
Check lipid panel  Continue daily statin Regular exercise and healthy diet encouraged  

## 2016-08-07 NOTE — Assessment & Plan Note (Signed)
Likely neuropathy - just diagnosed with diabetes last year - unlikely related to diabetes Deferred neurology evaluation -she will think about it Check blood work Discussed gabapentin - she deferred today - will let me know if she changes her mind, but agree with avoiding for now since symptoms are tolerable

## 2016-08-07 NOTE — Progress Notes (Signed)
Pre visit review using our clinic review tool, if applicable. No additional management support is needed unless otherwise documented below in the visit note. 

## 2016-08-07 NOTE — Patient Instructions (Addendum)

## 2016-08-07 NOTE — Assessment & Plan Note (Signed)
BP Readings from Last 3 Encounters:  08/07/16 (!) 152/86  06/30/16 132/84  02/02/16 (!) 144/80   Elevated today, but has been better No change in meds - just monitor Check labs

## 2016-08-07 NOTE — Assessment & Plan Note (Signed)
Check a1c Low sugar / carb diet Stressed regular exercise, keeping weight down/weight loss 

## 2016-08-08 LAB — ANA: ANA: NEGATIVE

## 2016-08-09 ENCOUNTER — Ambulatory Visit (INDEPENDENT_AMBULATORY_CARE_PROVIDER_SITE_OTHER): Payer: Medicare Other

## 2016-08-09 DIAGNOSIS — Z5181 Encounter for therapeutic drug level monitoring: Secondary | ICD-10-CM | POA: Diagnosis not present

## 2016-08-09 DIAGNOSIS — I4891 Unspecified atrial fibrillation: Secondary | ICD-10-CM

## 2016-08-09 LAB — PROTEIN ELECTROPHORESIS, SERUM
ALPHA-2-GLOBULIN: 0.9 g/dL (ref 0.5–0.9)
Albumin ELP: 4.1 g/dL (ref 3.8–4.8)
Alpha-1-Globulin: 0.5 g/dL — ABNORMAL HIGH (ref 0.2–0.3)
Beta 2: 0.3 g/dL (ref 0.2–0.5)
Beta Globulin: 0.5 g/dL (ref 0.4–0.6)
GAMMA GLOBULIN: 0.9 g/dL (ref 0.8–1.7)
TOTAL PROTEIN, SERUM ELECTROPHOR: 7.2 g/dL (ref 6.1–8.1)

## 2016-08-09 LAB — POCT INR: INR: 2.5

## 2016-08-09 NOTE — Progress Notes (Signed)
I have reviewed and agree with the plan. 

## 2016-08-09 NOTE — Patient Instructions (Signed)
Pre visit review using our clinic review tool, if applicable. No additional management support is needed unless otherwise documented below in the visit note. 

## 2016-09-05 DIAGNOSIS — H35423 Microcystoid degeneration of retina, bilateral: Secondary | ICD-10-CM | POA: Diagnosis not present

## 2016-09-05 DIAGNOSIS — H43393 Other vitreous opacities, bilateral: Secondary | ICD-10-CM | POA: Diagnosis not present

## 2016-09-05 DIAGNOSIS — H353231 Exudative age-related macular degeneration, bilateral, with active choroidal neovascularization: Secondary | ICD-10-CM | POA: Diagnosis not present

## 2016-09-05 DIAGNOSIS — H35373 Puckering of macula, bilateral: Secondary | ICD-10-CM | POA: Diagnosis not present

## 2016-09-20 ENCOUNTER — Ambulatory Visit (INDEPENDENT_AMBULATORY_CARE_PROVIDER_SITE_OTHER): Payer: Medicare Other | Admitting: General Practice

## 2016-09-20 DIAGNOSIS — Z5181 Encounter for therapeutic drug level monitoring: Secondary | ICD-10-CM

## 2016-09-20 DIAGNOSIS — I4891 Unspecified atrial fibrillation: Secondary | ICD-10-CM

## 2016-09-20 LAB — POCT INR: INR: 2.5

## 2016-09-20 NOTE — Progress Notes (Signed)
I have reviewed and agree with the plan. 

## 2016-09-20 NOTE — Patient Instructions (Signed)
Pre visit review using our clinic review tool, if applicable. No additional management support is needed unless otherwise documented below in the visit note. 

## 2016-10-25 ENCOUNTER — Other Ambulatory Visit: Payer: Self-pay | Admitting: Cardiovascular Disease

## 2016-10-31 DIAGNOSIS — H353231 Exudative age-related macular degeneration, bilateral, with active choroidal neovascularization: Secondary | ICD-10-CM | POA: Diagnosis not present

## 2016-10-31 DIAGNOSIS — H35423 Microcystoid degeneration of retina, bilateral: Secondary | ICD-10-CM | POA: Diagnosis not present

## 2016-10-31 DIAGNOSIS — H43393 Other vitreous opacities, bilateral: Secondary | ICD-10-CM | POA: Diagnosis not present

## 2016-10-31 DIAGNOSIS — H43813 Vitreous degeneration, bilateral: Secondary | ICD-10-CM | POA: Diagnosis not present

## 2016-11-01 ENCOUNTER — Ambulatory Visit: Payer: Medicare Other

## 2016-11-03 ENCOUNTER — Ambulatory Visit (INDEPENDENT_AMBULATORY_CARE_PROVIDER_SITE_OTHER): Payer: Medicare Other | Admitting: General Practice

## 2016-11-03 DIAGNOSIS — I4891 Unspecified atrial fibrillation: Secondary | ICD-10-CM

## 2016-11-03 DIAGNOSIS — Z5181 Encounter for therapeutic drug level monitoring: Secondary | ICD-10-CM

## 2016-11-03 LAB — POCT INR: INR: 1.8

## 2016-11-03 NOTE — Patient Instructions (Signed)
Pre visit review using our clinic review tool, if applicable. No additional management support is needed unless otherwise documented below in the visit note. 

## 2016-11-06 NOTE — Progress Notes (Signed)
inr checkec.  Agree with management.   Binnie Rail, MD

## 2016-11-27 ENCOUNTER — Emergency Department (HOSPITAL_COMMUNITY)
Admission: EM | Admit: 2016-11-27 | Discharge: 2016-11-27 | Disposition: A | Discharge status: Home / Self Care | Payer: Medicare Other | Attending: Emergency Medicine | Admitting: Emergency Medicine

## 2016-11-27 ENCOUNTER — Emergency Department (HOSPITAL_COMMUNITY): Payer: Medicare Other

## 2016-11-27 ENCOUNTER — Encounter (HOSPITAL_COMMUNITY): Payer: Self-pay | Admitting: Emergency Medicine

## 2016-11-27 DIAGNOSIS — R404 Transient alteration of awareness: Secondary | ICD-10-CM | POA: Diagnosis not present

## 2016-11-27 DIAGNOSIS — R42 Dizziness and giddiness: Secondary | ICD-10-CM | POA: Diagnosis not present

## 2016-11-27 DIAGNOSIS — Z7901 Long term (current) use of anticoagulants: Secondary | ICD-10-CM | POA: Diagnosis not present

## 2016-11-27 DIAGNOSIS — I69393 Ataxia following cerebral infarction: Secondary | ICD-10-CM | POA: Insufficient documentation

## 2016-11-27 DIAGNOSIS — R112 Nausea with vomiting, unspecified: Secondary | ICD-10-CM | POA: Diagnosis not present

## 2016-11-27 DIAGNOSIS — I10 Essential (primary) hypertension: Secondary | ICD-10-CM | POA: Diagnosis not present

## 2016-11-27 DIAGNOSIS — Z7982 Long term (current) use of aspirin: Secondary | ICD-10-CM | POA: Insufficient documentation

## 2016-11-27 DIAGNOSIS — S199XXA Unspecified injury of neck, initial encounter: Secondary | ICD-10-CM | POA: Diagnosis not present

## 2016-11-27 DIAGNOSIS — Z955 Presence of coronary angioplasty implant and graft: Secondary | ICD-10-CM | POA: Insufficient documentation

## 2016-11-27 DIAGNOSIS — Z79899 Other long term (current) drug therapy: Secondary | ICD-10-CM | POA: Diagnosis not present

## 2016-11-27 DIAGNOSIS — E119 Type 2 diabetes mellitus without complications: Secondary | ICD-10-CM | POA: Diagnosis not present

## 2016-11-27 DIAGNOSIS — S0990XA Unspecified injury of head, initial encounter: Secondary | ICD-10-CM | POA: Diagnosis not present

## 2016-11-27 DIAGNOSIS — Z8673 Personal history of transient ischemic attack (TIA), and cerebral infarction without residual deficits: Secondary | ICD-10-CM

## 2016-11-27 DIAGNOSIS — I2510 Atherosclerotic heart disease of native coronary artery without angina pectoris: Secondary | ICD-10-CM | POA: Diagnosis not present

## 2016-11-27 LAB — URINALYSIS, ROUTINE W REFLEX MICROSCOPIC
BILIRUBIN URINE: NEGATIVE
Glucose, UA: NEGATIVE mg/dL
HGB URINE DIPSTICK: NEGATIVE
Ketones, ur: NEGATIVE mg/dL
Leukocytes, UA: NEGATIVE
Nitrite: NEGATIVE
Protein, ur: NEGATIVE mg/dL
SPECIFIC GRAVITY, URINE: 1.023 (ref 1.005–1.030)
pH: 5 (ref 5.0–8.0)

## 2016-11-27 LAB — BASIC METABOLIC PANEL
Anion gap: 9 (ref 5–15)
BUN: 16 mg/dL (ref 6–20)
CALCIUM: 9 mg/dL (ref 8.9–10.3)
CO2: 21 mmol/L — AB (ref 22–32)
CREATININE: 0.95 mg/dL (ref 0.44–1.00)
Chloride: 108 mmol/L (ref 101–111)
GFR calc Af Amer: >60 mL/min (ref >60)
GFR calc non Af Amer: 53 mL/min — ABNORMAL LOW (ref >60)
GLUCOSE: 162 mg/dL — AB (ref 65–99)
Potassium: 3.7 mmol/L (ref 3.5–5.1)
Sodium: 138 mmol/L (ref 135–145)

## 2016-11-27 LAB — PROTIME-INR
INR: 2.32
Prothrombin Time: 25.9 seconds — ABNORMAL HIGH (ref 11.4–15.2)

## 2016-11-27 LAB — CBC
HCT: 39 % (ref 36.0–46.0)
HEMOGLOBIN: 13 g/dL (ref 12.0–15.0)
MCH: 30 pg (ref 26.0–34.0)
MCHC: 33.3 g/dL (ref 30.0–36.0)
MCV: 89.9 fL (ref 78.0–100.0)
PLATELETS: 189 10*3/uL (ref 150–400)
RBC: 4.34 MIL/uL (ref 3.87–5.11)
RDW: 14 % (ref 11.5–15.5)
WBC: 6.5 10*3/uL (ref 4.0–10.5)

## 2016-11-27 LAB — CBG MONITORING, ED: GLUCOSE-CAPILLARY: 133 mg/dL — AB (ref 65–99)

## 2016-11-27 MED ORDER — MECLIZINE HCL 25 MG PO TABS: 25.0000 mg | ORAL_TABLET | Freq: Three times a day (TID) | ORAL | 0 refills | Status: DC | PRN

## 2016-11-27 MED ORDER — MECLIZINE HCL 25 MG PO TABS
25.0000 mg | ORAL_TABLET | Freq: Once | ORAL | Status: AC
  Administered 2016-11-27: 25 mg via ORAL
  Filled 2016-11-27: qty 1

## 2016-11-27 MED ORDER — LISINOPRIL 10 MG PO TABS
10.0000 mg | ORAL_TABLET | Freq: Once | ORAL | Status: AC
  Administered 2016-11-27: 10 mg via ORAL
  Filled 2016-11-27: qty 1

## 2016-11-27 MED ORDER — GADOBENATE DIMEGLUMINE 529 MG/ML IV SOLN
20.0000 mL | Freq: Once | INTRAVENOUS | Status: AC | PRN
  Administered 2016-11-27: 20 mL via INTRAVENOUS

## 2016-11-27 NOTE — ED Provider Notes (Signed)
[facility] DEPT Provider Note   CSN: [id number] Arrival date & time: 11/27/16  4496     History   Chief Complaint Chief Complaint  Patient presents with  . Dizziness  . Orthostatic Hypotension    HPI Judy [name] is a 83 y.o. female.  HPI Patient is anticoagulated with history of atrial fibrillation. She got up to go the bathroom at 5:15 in the morning. She reports after she stood from using the restroom she got and dizzy feeling that was like a lot of spinning. No associated headache. No associated focal weakness numbness or tingling of the extremities. She reports due to how dizzy she was, she fell landing on her left arm and shoulder. She reports she had artery broken her fall but she did hit her head lightly. She denies any headache. No confusion. No nausea or vomiting. She reports the symptoms she has now is pain in her right arm. Patient reports after it happened she did "checked herself out" to make sure that everything was working right. She has been up and ambulatory to the bathroom since the fall without symptoms and dizziness is nearly resolved. She denies any weakness or incoordination with gait at this time. No pain in the back hips or legs with ambulation. Past Medical History:  Diagnosis Date  . Atrial fibrillation ([place])    a. chronic anticoag, failed prior DCCV;  b. 03/2010 Echo: EF 55-65%, mod MR, mildly to mod dil LA, mod dil RA, mild to mod MR.  Marland Kitchen CAD (coronary artery disease)    a. BMS to RCA 96';  b. Stent PDA and OM DES 2006  . CAROTID ARTERY DISEASE    a. 11/2011 Carotid U/S:  0-39% bilat dzs.  Marland Kitchen HTN (hypertension)   . Hyperlipidemia   . LBBB (left bundle branch block)   . Macular degeneration, wet ([place]) 10/2012 dx   L>R, follows with retinal specialist->injections monthly.  . Obesity     Patient Active Problem List   Diagnosis Date Noted  . Diabetes ([place]) 09/27/2015  . Numbness and tingling of foot 09/22/2015  . Encounter for therapeutic drug  monitoring 07/25/2013  . Macular degeneration, wet ([place])   . LBBB (left bundle branch block) 03/21/2011  . Long term (current) use of anticoagulants 09/08/2010  . Carotid artery disease ([place]) 08/31/2009  . OBESITY 08/23/2009  . Mixed hyperlipidemia 12/16/2008  . Essential hypertension, benign 12/16/2008  . CAD, NATIVE VESSEL 12/16/2008  . ATRIAL FIBRILLATION 12/16/2008    Past Surgical History:  Procedure Laterality Date  . [place], 2006 x2   Drug-eluting stent placement to the PDA, drug-eluting stent  placement to the first obtuse  marginal, StarClose closure of the right common femoral arteriotomy site. Successful drug-eluting stent  placement in  both the posterior descending artery and the obtuse marginal. The lesion was directly stented using a 2.5 x 16 mm Taxus deployed at 14 atmospheres.   Marland Kitchen EYE SURGERY     Tear duct stent    OB History    No data available       Home Medications    Prior to Admission medications   Medication Sig Start Date End Date Taking? Authorizing Provider  aspirin 81 MG tablet Take 81 mg by mouth daily.     Yes [provider]  azelastine (ASTELIN) 0.1 % nasal spray Place 2 sprays into the nose 2 (two) times daily as needed for rhinitis or allergies.   Yes [provider]  b complex vitamins tablet Take 1 tablet by mouth daily. 08/07/16  Yes Burns, Claudina Lick, MD  diltiazem (CARDIZEM CD) 120 MG 24 hr capsule TAKE ONE CAPSULE BY MOUTH NIGHTLY AT BEDTIME 01/06/16  Yes Josue Hector, MD  Fexofenadine HCl (ALLEGRA ALLERGY PO) Take 1 tablet by mouth daily.    Yes [provider]  furosemide (LASIX) 20 MG tablet TAKE ONE TABLET BY MOUTH ONE TIME DAILY 10/26/16  Yes Josue Hector, MD  isosorbide mononitrate (IMDUR) 30 MG 24 hr tablet Take 1 tablet (30 mg total) by mouth at bedtime. 06/30/16  Yes Josue Hector, MD  lisinopril (PRINIVIL,ZESTRIL) 10 MG tablet Take 1 tablet (10 mg total) by mouth daily. 06/30/16   Yes Josue Hector, MD  methocarbamol (ROBAXIN) 500 MG tablet TAKE 1 TABLET BY MOUTH EVERY 8 HOURS AS NEEDED FOR MUSCLE SPASM 07/27/16  Yes Burns, Claudina Lick, MD  Multiple Vitamin (MULTIVITAMIN WITH MINERALS) TABS tablet Take 1 tablet by mouth daily.   Yes [provider]  NITROSTAT 0.4 MG SL tablet PLACE 1 TABLET UNDER TONGUE EVERY 5 MINUTES AS NEEDED FOR CHEST PAIN 08/04/15  Yes Rogelia Mire, NP  potassium chloride (KLOR-CON SPRINKLE) 10 MEQ CR capsule Take 1 capsule (10 mEq total) by mouth daily. 12/15/15  Yes Josue Hector, MD  tobramycin (TOBREX) 0.3 % ophthalmic solution Place 1 drop into both eyes daily as needed.   Yes [provider]  meclizine (ANTIVERT) 25 MG tablet Take 1 tablet (25 mg total) by mouth 3 (three) times daily as needed for dizziness. 11/27/16   Charlesetta Shanks, MD  metoprolol succinate (TOPROL-XL) 100 MG 24 hr tablet TAKE 1 TABLET (100 MG TOTAL) BY MOUTH DAILY. 11/30/16   Binnie Rail, MD  pravastatin (PRAVACHOL) 80 MG tablet TAKE 1 TABLET (80 MG TOTAL) BY MOUTH AT BEDTIME. 11/30/16   Josue Hector, MD  warfarin (COUMADIN) 2.5 MG tablet TAKE AS DIRECTED BY ANTICOAGULATION CLINIC 11/30/16   Binnie Rail, MD    Family History Family History  Problem Relation Age of Onset  . Arthritis Mother        died in her 63's  . Arthritis Father        died in [place].  Marland Kitchen CAD Brother        deceased  . CAD Brother        deceased  . CAD Sister        deceased    Social History Social History  Substance Use Topics  . Smoking status: Never Smoker  . Smokeless tobacco: Never Used  . Alcohol use No     Allergies   Betadine [povidone iodine]   Review of Systems Review of Systems 10 Systems reviewed and are negative for acute change except as noted in the HPI.   Physical Exam Updated Vital Signs BP (!) 167/76 (BP Location: Right Arm)   Pulse 88   Temp 97.8 F (36.6 C)   Resp (!) 22   Ht 5\' 5"  (1.651 m)   Wt 90.7 kg (200 lb)   SpO2 97%    BMI 33.28 kg/m   Physical Exam  Constitutional: She is oriented to person, place, and time. She appears well-developed and well-nourished. No distress.  HENT:  Head: Normocephalic and atraumatic.  Nose: Nose normal.  Mouth/Throat: Oropharynx is clear and moist.  Bilateral TMs normal.  Eyes: Conjunctivae and EOM are normal. Pupils are equal, round, and reactive to light.  Neck: Neck supple.  Cardiovascular: Normal rate and intact distal pulses.   No murmur heard. Irregularly irregular rhythm. Rate controlled.  Pulmonary/Chest: Effort normal and breath sounds normal. No respiratory distress.  Abdominal: Soft. She exhibits no distension. There is no tenderness. There is no guarding.  Musculoskeletal: Normal range of motion. She exhibits tenderness. She exhibits no edema.  Patient has contusion to the left upper arm with what appears to be a deep bruise developing over the mid humerus. There are however no bony deformities. Patient has excellent range of motion of the shoulder elbow and wrist without any significant pain. Excellent range of motion bilateral lower extremities. Patient can flex deeply and extended push me away without pain or weakness.  Neurological: She is alert and oriented to person, place, and time. No cranial nerve deficit or sensory deficit. She exhibits normal muscle tone. Coordination normal.  No cognitive delay. Follows all commands appropriately. Speech is clear without slurring. Upper and lower extremity strength testing is 5\5 motor.  Skin: Skin is warm and dry.  Psychiatric: She has a normal mood and affect.  Nursing note and vitals reviewed.    ED Treatments / Results  Labs (all labs ordered are listed, but only abnormal results are displayed) Labs Reviewed  BASIC METABOLIC PANEL - Abnormal; Notable for the following:       Result Value   CO2 21 (*)    Glucose, Bld 162 (*)    GFR calc non Af Amer 53 (*)    All other components within normal limits    PROTIME-INR - Abnormal; Notable for the following:    Prothrombin Time 25.9 (*)    All other components within normal limits  CBG MONITORING, ED - Abnormal; Notable for the following:    Glucose-Capillary 133 (*)    All other components within normal limits  CBC  URINALYSIS, ROUTINE W REFLEX MICROSCOPIC    EKG  EKG Interpretation  Date/Time:  Monday November 27 2016 06:35:30 EDT Ventricular Rate:  73 PR Interval:    QRS Duration: 145 QT Interval:  445 QTC Calculation: 491 R Axis:   64 Text Interpretation:  Atrial fibrillation Nonspecific intraventricular conduction delay Abnormal inferior Q waves Consider anterior infarct No significant change since last tracing Confirmed by Wandra Arthurs [phone]) on 11/30/2016 12:04:09 PM       Radiology No results found.  Procedures Procedures (including critical care time)  Medications Ordered in ED Medications  meclizine (ANTIVERT) tablet 25 mg (25 mg Oral Given 11/27/16 0742)  gadobenate dimeglumine (MULTIHANCE) injection 20 mL (20 mLs Intravenous Contrast Given 11/27/16 1528)  lisinopril (PRINIVIL,ZESTRIL) tablet 10 mg (10 mg Oral Given 11/27/16 1657)     Initial Impression / Assessment and Plan / ED Course  I have reviewed the triage vital signs and the nursing notes.  Pertinent labs & imaging results that were available during my care of the patient were reviewed by me and considered in my medical decision making (see chart for details).     Consult: 09:40 Dr. Leonel Ramsay of neurology. Proceed with MRI to determine if this is acute or old infarct. Patient is artery anticoagulated and treated for CVA.  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 Patient remained alert and appropriate. She has been up and ambulatory. At this time findings most consistent with peripheral vertigo with MRI  ruling out stroke. Patient counseled on  return precautions and Antivert for vertiginous symptoms. New Prescriptions Discharge Medication List as of 11/27/2016  4:40 PM    START taking these medications   Details  meclizine (ANTIVERT) 25 MG tablet Take 1 tablet (25 mg total) by mouth 3 (three) times daily as needed for dizziness., Starting Mon 11/27/2016, Print         Charlesetta Shanks, MD 12/03/16 [phone]

## 2016-11-27 NOTE — ED Notes (Signed)
Pt. Sat up to use restroom and had short episode of dizziness. EDP made aware.

## 2016-11-27 NOTE — ED Notes (Signed)
Pt. Transported to MRI 

## 2016-11-27 NOTE — ED Triage Notes (Signed)
[facility] reports pt got up at 0515 to use bathroom and felt dizzy and fell. EMS reports she hit her shoulder but has no pain. EMS reports the pt got up and back into the bed. EMS reports pt subsequently attempted to get back up and could not, thus she called 9-1-1. EMS reports 12 lead EKG is unremarkable. Vital signs lying 152/78 P-80, sitting 136/70 P-70. BS 127.   Pt states she got dizzy when she stood up to use bathroom and fell. Pt reports she hit her shoulder but does not have any pain.

## 2016-11-27 NOTE — ED Notes (Signed)
Pt Still in MRI 

## 2016-11-28 ENCOUNTER — Telehealth: Payer: Self-pay | Admitting: Cardiovascular Disease

## 2016-11-28 DIAGNOSIS — I779 Disorder of arteries and arterioles, unspecified: Secondary | ICD-10-CM

## 2016-11-28 DIAGNOSIS — I739 Peripheral vascular disease, unspecified: Principal | ICD-10-CM

## 2016-11-28 NOTE — Telephone Encounter (Signed)
New Message     Pt would like Dr Johnsie Cancel to put a order in for a Carotid test to be done

## 2016-11-28 NOTE — Telephone Encounter (Signed)
Notes Recorded by Josue Hector, MD on 09/25/2014 at 2:36 PM Plaque no bad stenosis f/u duplex 2 years    Placed order for carotid duplex for 2 year f/u. Will send message to schedule to make an appointment for patient.

## 2016-11-29 ENCOUNTER — Other Ambulatory Visit: Payer: Self-pay | Admitting: Internal Medicine

## 2016-11-30 ENCOUNTER — Other Ambulatory Visit: Payer: Self-pay | Admitting: Cardiovascular Disease

## 2016-11-30 ENCOUNTER — Other Ambulatory Visit: Payer: Self-pay | Admitting: Internal Medicine

## 2016-11-30 MED ORDER — WARFARIN SODIUM 2.5 MG PO TABS: ORAL_TABLET | ORAL | 1 refills | Status: DC

## 2016-12-04 ENCOUNTER — Ambulatory Visit (HOSPITAL_COMMUNITY)
Admission: RE | Admit: 2016-12-04 | Discharge: 2016-12-04 | Disposition: A | Discharge status: Home / Self Care | Payer: Medicare Other | Source: Ambulatory Visit | Attending: Cardiology | Admitting: Cardiology

## 2016-12-04 DIAGNOSIS — I6523 Occlusion and stenosis of bilateral carotid arteries: Secondary | ICD-10-CM | POA: Diagnosis not present

## 2016-12-04 DIAGNOSIS — I739 Peripheral vascular disease, unspecified: Secondary | ICD-10-CM

## 2016-12-04 DIAGNOSIS — I779 Disorder of arteries and arterioles, unspecified: Secondary | ICD-10-CM | POA: Insufficient documentation

## 2016-12-07 ENCOUNTER — Other Ambulatory Visit: Payer: Self-pay | Admitting: Cardiovascular Disease

## 2016-12-15 ENCOUNTER — Ambulatory Visit (INDEPENDENT_AMBULATORY_CARE_PROVIDER_SITE_OTHER): Payer: Medicare Other | Admitting: General Practice

## 2016-12-15 DIAGNOSIS — Z5181 Encounter for therapeutic drug level monitoring: Secondary | ICD-10-CM

## 2016-12-15 DIAGNOSIS — I4891 Unspecified atrial fibrillation: Secondary | ICD-10-CM

## 2016-12-15 LAB — POCT INR: INR: 3.5

## 2016-12-15 NOTE — Progress Notes (Signed)
I have reviewed and agree with the plan. 

## 2016-12-15 NOTE — Patient Instructions (Signed)
Pre visit review using our clinic review tool, if applicable. No additional management support is needed unless otherwise documented below in the visit note. 

## 2016-12-16 ENCOUNTER — Other Ambulatory Visit: Payer: Self-pay | Admitting: Internal Medicine

## 2016-12-25 ENCOUNTER — Encounter: Payer: Self-pay | Admitting: *Deleted

## 2016-12-29 ENCOUNTER — Ambulatory Visit (INDEPENDENT_AMBULATORY_CARE_PROVIDER_SITE_OTHER): Payer: Medicare Other | Admitting: General Practice

## 2016-12-29 DIAGNOSIS — Z5181 Encounter for therapeutic drug level monitoring: Secondary | ICD-10-CM

## 2016-12-29 DIAGNOSIS — I4891 Unspecified atrial fibrillation: Secondary | ICD-10-CM

## 2016-12-29 LAB — POCT INR: INR: 2.3

## 2016-12-29 NOTE — Patient Instructions (Signed)
Pre visit review using our clinic review tool, if applicable. No additional management support is needed unless otherwise documented below in the visit note. 

## 2016-12-29 NOTE — Progress Notes (Signed)
I have reviewed and agree with the plan. 

## 2017-01-05 ENCOUNTER — Other Ambulatory Visit: Payer: Self-pay

## 2017-01-06 NOTE — Progress Notes (Signed)
Patient ID: Judy [name], female   DOB: 1930/08/25, 83 y.o.   MRN: [id number]  83 y.o.  female with prior history of coronary artery disease status post prior stenting of the right coronary artery in 1996 and subsequent stenting of the PDA and OM in 2006. Chronic afib on anticoagulation with rate control She lives by herself in [place] and is quite active around her home without symptoms or limitations. She does have macular degeneration requires monthly injections of the left eye and every other monthly injections into the right eye.   09/2016   1-39% ICA Duplex  Problems with macular degeneration and tear duct on right eye Gets shots by Dr Tye Savoy   Some neuropathy in legs  Had [place] surgeon remove lesion from left leg while on coumadin   ROS: Denies fever, malais, weight loss, blurry vision, decreased visual acuity, cough, sputum, SOB, hemoptysis, pleuritic pain, palpitaitons, heartburn, abdominal pain, melena, lower extremity edema, claudication, or rash.  All other systems reviewed and negative  General: Affect appropriate Healthy:  appears stated age 83: normal Neck supple with no adenopathy JVP normal no bruits no thyromegaly Lungs clear with no wheezing and good diaphragmatic motion Heart:  S1/S2 no murmur, no rub, gallop or click PMI normal Abdomen: benighn, BS positve, no tenderness, no AAA no bruit.  No HSM or HJR Distal pulses intact with no bruits No edema Neuro non-focal Skin warm and dry No muscular weakness    Current Outpatient Prescriptions  Medication Sig Dispense Refill  . aspirin 81 MG tablet Take 81 mg by mouth daily.      Marland Kitchen azelastine (ASTELIN) 0.1 % nasal spray Place 2 sprays into the nose 2 (two) times daily as needed for rhinitis or allergies.    Marland Kitchen b complex vitamins tablet Take 1 tablet by mouth daily.    Marland Kitchen diltiazem (CARDIZEM CD) 120 MG 24 hr capsule TAKE ONE CAPSULE BY MOUTH NIGHTLY AT BEDTIME 90 capsule 3  . Fexofenadine HCl (ALLEGRA ALLERGY  PO) Take 1 tablet by mouth daily.     . furosemide (LASIX) 20 MG tablet TAKE ONE TABLET BY MOUTH ONE TIME DAILY 90 tablet 2  . isosorbide mononitrate (IMDUR) 30 MG 24 hr tablet Take 1 tablet (30 mg total) by mouth at bedtime. 90 tablet 3  . lisinopril (PRINIVIL,ZESTRIL) 10 MG tablet Take 1 tablet (10 mg total) by mouth daily. 90 tablet 3  . meclizine (ANTIVERT) 25 MG tablet Take 1 tablet (25 mg total) by mouth 3 (three) times daily as needed for dizziness. 30 tablet 0  . methocarbamol (ROBAXIN) 500 MG tablet TAKE 1 TABLET BY MOUTH EVERY 8 HOURS AS NEEDED FOR MUSCLE SPASM 30 tablet 0  . metoprolol succinate (TOPROL-XL) 100 MG 24 hr tablet TAKE 1 TABLET (100 MG TOTAL) BY MOUTH DAILY. 90 tablet 0  . Multiple Vitamin (MULTIVITAMIN WITH MINERALS) TABS tablet Take 1 tablet by mouth daily.    Marland Kitchen NITROSTAT 0.4 MG SL tablet PLACE 1 TABLET UNDER TONGUE EVERY 5 MINUTES AS NEEDED FOR CHEST PAIN 25 tablet 8  . potassium chloride (MICRO-K) 10 MEQ CR capsule TAKE 1 CAPSULE (10 MEQ TOTAL) BY MOUTH DAILY. 90 capsule 1  . pravastatin (PRAVACHOL) 80 MG tablet TAKE 1 TABLET (80 MG TOTAL) BY MOUTH AT BEDTIME. 90 tablet 1  . tobramycin (TOBREX) 0.3 % ophthalmic solution Place 1 drop into both eyes daily as needed.    . warfarin (COUMADIN) 2.5 MG tablet TAKE AS DIRECTED BY ANTICOAGULATION CLINIC 90 tablet 1  No current facility-administered medications for this visit.     Allergies  Betadine [povidone iodine]  Electrocardiogram:  afib low voltage non specific ST chages  10/14  03/09/14  afib rate 72 ? Old IMI  04/28/15 afib rate 74 Old IMI 06/30/16 afib rate 23 old IMI   Assessment and Plan CAD: Stable with no angina and good activity level.  Continue medical Rx Carotid: plaque no stenosis f/u duplex 09/2018 Afib: chronic good rate control and anticoagulation  Anticoagulation:  No bleeding issues INR Rx gets checked every 6 weeks  Chol::  Labs wit hprimary  Lab Results  Component Value Date   LDLCALC 100 (H)  09/22/2015      Jenkins Rouge

## 2017-01-08 ENCOUNTER — Encounter: Payer: Self-pay | Admitting: Cardiovascular Disease

## 2017-01-08 ENCOUNTER — Ambulatory Visit (INDEPENDENT_AMBULATORY_CARE_PROVIDER_SITE_OTHER): Payer: Medicare Other | Admitting: Cardiovascular Disease

## 2017-01-08 ENCOUNTER — Encounter (INDEPENDENT_AMBULATORY_CARE_PROVIDER_SITE_OTHER): Payer: Self-pay

## 2017-01-08 VITALS — BP 108/70 | HR 77 | Ht 65.0 in | Wt 192.0 lb

## 2017-01-08 DIAGNOSIS — I4819 Other persistent atrial fibrillation: Secondary | ICD-10-CM

## 2017-01-08 DIAGNOSIS — I481 Persistent atrial fibrillation: Secondary | ICD-10-CM | POA: Diagnosis not present

## 2017-01-08 NOTE — Patient Instructions (Signed)

## 2017-01-09 DIAGNOSIS — H35372 Puckering of macula, left eye: Secondary | ICD-10-CM | POA: Diagnosis not present

## 2017-01-09 DIAGNOSIS — H353231 Exudative age-related macular degeneration, bilateral, with active choroidal neovascularization: Secondary | ICD-10-CM | POA: Diagnosis not present

## 2017-01-09 DIAGNOSIS — H43393 Other vitreous opacities, bilateral: Secondary | ICD-10-CM | POA: Diagnosis not present

## 2017-01-09 DIAGNOSIS — H35423 Microcystoid degeneration of retina, bilateral: Secondary | ICD-10-CM | POA: Diagnosis not present

## 2017-01-26 ENCOUNTER — Ambulatory Visit (INDEPENDENT_AMBULATORY_CARE_PROVIDER_SITE_OTHER): Payer: Medicare Other | Admitting: General Practice

## 2017-01-26 DIAGNOSIS — I4891 Unspecified atrial fibrillation: Secondary | ICD-10-CM | POA: Diagnosis not present

## 2017-01-26 DIAGNOSIS — Z5181 Encounter for therapeutic drug level monitoring: Secondary | ICD-10-CM | POA: Diagnosis not present

## 2017-01-26 LAB — POCT INR: INR: 2.8

## 2017-01-26 NOTE — Progress Notes (Signed)
I have reviewed and agree with the plan. 

## 2017-01-26 NOTE — Patient Instructions (Signed)
Pre visit review using our clinic review tool, if applicable. No additional management support is needed unless otherwise documented below in the visit note. 

## 2017-02-07 ENCOUNTER — Encounter: Payer: Self-pay | Admitting: Internal Medicine

## 2017-02-07 ENCOUNTER — Ambulatory Visit (INDEPENDENT_AMBULATORY_CARE_PROVIDER_SITE_OTHER): Payer: Medicare Other | Admitting: Internal Medicine

## 2017-02-07 VITALS — BP 128/88 | HR 81 | Temp 97.8°F | Resp 16 | Wt 193.0 lb

## 2017-02-07 DIAGNOSIS — I4891 Unspecified atrial fibrillation: Secondary | ICD-10-CM | POA: Diagnosis not present

## 2017-02-07 DIAGNOSIS — E119 Type 2 diabetes mellitus without complications: Secondary | ICD-10-CM

## 2017-02-07 DIAGNOSIS — I10 Essential (primary) hypertension: Secondary | ICD-10-CM

## 2017-02-07 DIAGNOSIS — E782 Mixed hyperlipidemia: Secondary | ICD-10-CM

## 2017-02-07 DIAGNOSIS — I2510 Atherosclerotic heart disease of native coronary artery without angina pectoris: Secondary | ICD-10-CM | POA: Diagnosis not present

## 2017-02-07 MED ORDER — ACETIC ACID 2 % OT SOLN: 4.0000 [drp] | Freq: Three times a day (TID) | OTIC | 5 refills | Status: DC

## 2017-02-07 NOTE — Assessment & Plan Note (Addendum)
No chest pain or SOB On asa, warfarin, statin and metoprolol Following with cardiology encouraged increased activity Cmp, cbc

## 2017-02-07 NOTE — Progress Notes (Signed)
Subjective:    Patient ID: Judy [name], female    DOB: [birth year], 83 y.o.   MRN: [id number]  HPI The patient is here for follow up.  Diabetes: She is controlling her sugars with diet. She is compliant with a diabetic diet. She is not exercising regularly.  She checks her feet daily and denies foot lesions. She is up-to-date with an ophthalmology examination.   CAD, Afib, Hypertension: She is taking her medication daily. She is compliant with a low sodium diet.  She denies chest pain, edema, shortness of breath and regular headaches. She is not exercising regularly.  She does not monitor her blood pressure at home.    Hyperlipidemia: She is taking her medication daily. She is compliant with a low fat/cholesterol diet. She is not exercising regularly. She denies myalgias.     Medications and allergies reviewed with patient and updated if appropriate.  Patient Active Problem List   Diagnosis Date Noted  . Diabetes ([place]) 09/27/2015  . Numbness and tingling of foot 09/22/2015  . Encounter for therapeutic drug monitoring 07/25/2013  . Macular degeneration, wet ([place])   . LBBB (left bundle branch block) 03/21/2011  . Long term (current) use of anticoagulants 09/08/2010  . Carotid artery disease ([place]) 08/31/2009  . OBESITY 08/23/2009  . Mixed hyperlipidemia 12/16/2008  . Essential hypertension, benign 12/16/2008  . CAD, NATIVE VESSEL 12/16/2008  . ATRIAL FIBRILLATION 12/16/2008    Current Outpatient Prescriptions on File Prior to Visit  Medication Sig Dispense Refill  . aspirin 81 MG tablet Take 81 mg by mouth daily.      Marland Kitchen azelastine (ASTELIN) 0.1 % nasal spray Place 2 sprays into the nose 2 (two) times daily as needed for rhinitis or allergies.    Marland Kitchen b complex vitamins tablet Take 1 tablet by mouth daily.    Marland Kitchen diltiazem (CARDIZEM CD) 120 MG 24 hr capsule TAKE ONE CAPSULE BY MOUTH NIGHTLY AT BEDTIME 90 capsule 3  . Fexofenadine HCl (ALLEGRA ALLERGY PO) Take 1 tablet by mouth  daily.     . furosemide (LASIX) 20 MG tablet TAKE ONE TABLET BY MOUTH ONE TIME DAILY 90 tablet 2  . isosorbide mononitrate (IMDUR) 30 MG 24 hr tablet Take 1 tablet (30 mg total) by mouth at bedtime. 90 tablet 3  . lisinopril (PRINIVIL,ZESTRIL) 10 MG tablet Take 1 tablet (10 mg total) by mouth daily. 90 tablet 3  . meclizine (ANTIVERT) 25 MG tablet Take 1 tablet (25 mg total) by mouth 3 (three) times daily as needed for dizziness. 30 tablet 0  . methocarbamol (ROBAXIN) 500 MG tablet TAKE 1 TABLET BY MOUTH EVERY 8 HOURS AS NEEDED FOR MUSCLE SPASM 30 tablet 0  . metoprolol succinate (TOPROL-XL) 100 MG 24 hr tablet TAKE 1 TABLET (100 MG TOTAL) BY MOUTH DAILY. 90 tablet 0  . Multiple Vitamin (MULTIVITAMIN WITH MINERALS) TABS tablet Take 1 tablet by mouth daily.    Marland Kitchen NITROSTAT 0.4 MG SL tablet PLACE 1 TABLET UNDER TONGUE EVERY 5 MINUTES AS NEEDED FOR CHEST PAIN 25 tablet 8  . potassium chloride (MICRO-K) 10 MEQ CR capsule TAKE 1 CAPSULE (10 MEQ TOTAL) BY MOUTH DAILY. 90 capsule 1  . pravastatin (PRAVACHOL) 80 MG tablet TAKE 1 TABLET (80 MG TOTAL) BY MOUTH AT BEDTIME. 90 tablet 1  . tobramycin (TOBREX) 0.3 % ophthalmic solution Place 1 drop into both eyes daily as needed.    . warfarin (COUMADIN) 2.5 MG tablet TAKE AS DIRECTED BY ANTICOAGULATION CLINIC 90 tablet  1   No current facility-administered medications on file prior to visit.     Past Medical History:  Diagnosis Date  . Atrial fibrillation ([place])    a. chronic anticoag, failed prior DCCV;  b. 03/2010 Echo: EF 55-65%, mod MR, mildly to mod dil LA, mod dil RA, mild to mod MR.  Marland Kitchen CAD (coronary artery disease)    a. BMS to RCA 96';  b. Stent PDA and OM DES 2006  . CAROTID ARTERY DISEASE    a. 11/2011 Carotid U/S:  0-39% bilat dzs.  Marland Kitchen HTN (hypertension)   . Hyperlipidemia   . LBBB (left bundle branch block)   . Macular degeneration, wet ([place]) 10/2012 dx   L>R, follows with retinal specialist->injections monthly.  . Obesity     Past Surgical  History:  Procedure Laterality Date  . [place], 2006 x2   Drug-eluting stent placement to the PDA, drug-eluting stent  placement to the first obtuse  marginal, StarClose closure of the right common femoral arteriotomy site. Successful drug-eluting stent  placement in  both the posterior descending artery and the obtuse marginal. The lesion was directly stented using a 2.5 x 16 mm Taxus deployed at 14 atmospheres.   Marland Kitchen EYE SURGERY     Tear duct stent    Social History   Social History  . Marital status: Widowed    Spouse name: N/A  . Number of children: N/A  . Years of education: N/A   Social History Main Topics  . Smoking status: Never Smoker  . Smokeless tobacco: Never Used  . Alcohol use No  . Drug use: No  . Sexual activity: Not on file   Other Topics Concern  . Not on file   Social History Narrative   Lives in [place] by herself.  She is active around the house w/o symptoms or limitations.    Family History  Problem Relation Age of Onset  . Arthritis Mother        died in her 57's  . Arthritis Father        died in [place].  Marland Kitchen CAD Brother        deceased  . CAD Brother        deceased  . CAD Sister        deceased    Review of Systems  Constitutional: Negative for chills and fever.  HENT: Positive for postnasal drip.   Respiratory: Positive for cough. Negative for shortness of breath and wheezing.   Cardiovascular: Positive for palpitations. Negative for chest pain and leg swelling.  Neurological: Negative for dizziness, light-headedness and headaches.       Objective:   Vitals:   02/07/17 1039  BP: 128/88  Pulse: 81  Resp: 16  Temp: 97.8 F (36.6 C)  SpO2: 97%   Wt Readings from Last 3 Encounters:  02/07/17 193 lb (87.5 kg)  01/08/17 192 lb (87.1 kg)  11/27/16 200 lb (90.7 kg)   Body mass index is 32.12 kg/m.   Physical Exam    Constitutional: Appears well-developed and well-nourished. No distress.  HENT:  Head:  Normocephalic and atraumatic.  Neck: Neck supple. No tracheal deviation present. No thyromegaly present.  No cervical lymphadenopathy Cardiovascular: Normal rate, regular rhythm and normal heart sounds.   No murmur heard. No carotid bruit .  No edema Pulmonary/Chest: Effort normal and breath sounds normal. No respiratory distress. No has no wheezes. No rales.  Skin: Skin is warm and dry. Not diaphoretic.  Psychiatric: Normal mood and affect. Behavior is normal.   Diabetic Foot Exam - Simple   Simple Foot Form Diabetic Foot exam was performed with the following findings:  Yes   Visual Inspection No deformities, no ulcerations, no other skin breakdown bilaterally:  Yes Sensation Testing Intact to touch and monofilament testing bilaterally:  Yes Pulse Check Posterior Tibialis and Dorsalis pulse intact bilaterally:  Yes Comments       Assessment & Plan:    See Problem List for Assessment and Plan of chronic medical problems.

## 2017-02-07 NOTE — Assessment & Plan Note (Signed)
Lipids controlled Continue statin 

## 2017-02-07 NOTE — Assessment & Plan Note (Signed)
BP well controlled Current regimen effective and well tolerated Continue current medications at current doses cmp  

## 2017-02-07 NOTE — Assessment & Plan Note (Signed)
Rate controlled, on warfarin Has occasional palpitations Following with cardiology

## 2017-02-07 NOTE — Patient Instructions (Addendum)
  Test(s) ordered today. Your results will be released to [facility] (or called to you) after review, usually within 72hours after test completion. If any changes need to be made, you will be notified at that same time.  All other Health Maintenance issues reviewed.   All recommended immunizations and age-appropriate screenings are up-to-date or discussed.  No immunizations administered today.   Medications reviewed and updated.  Changes include trying a ear drop for itching.  Your prescription(s) have been submitted to your pharmacy. Please take as directed and contact our office if you believe you are having problem(s) with the medication(s).   Please followup in 6 months

## 2017-02-07 NOTE — Assessment & Plan Note (Signed)
Check a1c Low sugar / carb diet Stressed regular exercise   

## 2017-02-21 ENCOUNTER — Other Ambulatory Visit: Payer: Self-pay | Admitting: Internal Medicine

## 2017-02-23 ENCOUNTER — Ambulatory Visit: Payer: Medicare Other

## 2017-02-27 ENCOUNTER — Other Ambulatory Visit: Payer: Self-pay | Admitting: Internal Medicine

## 2017-02-28 ENCOUNTER — Ambulatory Visit: Payer: Medicare Other

## 2017-03-02 ENCOUNTER — Ambulatory Visit (INDEPENDENT_AMBULATORY_CARE_PROVIDER_SITE_OTHER): Payer: Medicare Other | Admitting: General Practice

## 2017-03-02 DIAGNOSIS — Z7901 Long term (current) use of anticoagulants: Secondary | ICD-10-CM

## 2017-03-02 DIAGNOSIS — Z5181 Encounter for therapeutic drug level monitoring: Secondary | ICD-10-CM | POA: Diagnosis not present

## 2017-03-02 DIAGNOSIS — I4891 Unspecified atrial fibrillation: Secondary | ICD-10-CM | POA: Diagnosis not present

## 2017-03-02 DIAGNOSIS — Z23 Encounter for immunization: Secondary | ICD-10-CM

## 2017-03-02 LAB — POCT INR: INR: 2.8

## 2017-03-02 NOTE — Patient Instructions (Signed)
Pre visit review using our clinic review tool, if applicable. No additional management support is needed unless otherwise documented below in the visit note. 

## 2017-03-02 NOTE — Progress Notes (Signed)
I have reviewed and agree with the plan. 

## 2017-03-20 DIAGNOSIS — H43393 Other vitreous opacities, bilateral: Secondary | ICD-10-CM | POA: Diagnosis not present

## 2017-03-20 DIAGNOSIS — H43813 Vitreous degeneration, bilateral: Secondary | ICD-10-CM | POA: Diagnosis not present

## 2017-03-20 DIAGNOSIS — H35423 Microcystoid degeneration of retina, bilateral: Secondary | ICD-10-CM | POA: Diagnosis not present

## 2017-03-20 DIAGNOSIS — H353231 Exudative age-related macular degeneration, bilateral, with active choroidal neovascularization: Secondary | ICD-10-CM | POA: Diagnosis not present

## 2017-03-24 ENCOUNTER — Other Ambulatory Visit: Payer: Self-pay | Admitting: Internal Medicine

## 2017-03-25 ENCOUNTER — Other Ambulatory Visit: Payer: Self-pay | Admitting: Cardiovascular Disease

## 2017-03-28 ENCOUNTER — Ambulatory Visit (INDEPENDENT_AMBULATORY_CARE_PROVIDER_SITE_OTHER): Payer: Medicare Other | Admitting: General Practice

## 2017-03-28 DIAGNOSIS — Z7901 Long term (current) use of anticoagulants: Secondary | ICD-10-CM

## 2017-03-28 DIAGNOSIS — I4891 Unspecified atrial fibrillation: Secondary | ICD-10-CM

## 2017-03-28 LAB — POCT INR: INR: 1.9

## 2017-03-28 NOTE — Patient Instructions (Signed)
Pre visit review using our clinic review tool, if applicable. No additional management support is needed unless otherwise documented below in the visit note. 

## 2017-03-29 NOTE — Progress Notes (Signed)
Agree with management.  Ladon Vandenberghe J Armany Mano, MD  

## 2017-04-11 ENCOUNTER — Telehealth: Payer: Self-pay | Admitting: Internal Medicine

## 2017-04-11 MED ORDER — MECLIZINE HCL 25 MG PO TABS: 25.0000 mg | ORAL_TABLET | Freq: Three times a day (TID) | ORAL | 0 refills | Status: DC | PRN

## 2017-04-11 NOTE — Telephone Encounter (Signed)
Spoke with patient today. Judy [name] was seen in the hospital in June 2018 and prescribed meclizine. Pt was seen by Dr. Quay Burow in August 2018 and would like a refill for this medication. She stated that [facility] is [facility] [place]. [place]

## 2017-04-11 NOTE — Telephone Encounter (Signed)
Reviewed chart pt is up-to-date sent refills to [facility].../lmb  

## 2017-04-25 ENCOUNTER — Ambulatory Visit (INDEPENDENT_AMBULATORY_CARE_PROVIDER_SITE_OTHER): Payer: Medicare Other | Admitting: General Practice

## 2017-04-25 DIAGNOSIS — I4891 Unspecified atrial fibrillation: Secondary | ICD-10-CM

## 2017-04-25 DIAGNOSIS — Z7901 Long term (current) use of anticoagulants: Secondary | ICD-10-CM | POA: Diagnosis not present

## 2017-04-25 LAB — POCT INR: INR: 2.9

## 2017-04-25 NOTE — Patient Instructions (Addendum)
Pre visit review using our clinic review tool, if applicable. No additional management support is needed unless otherwise documented below in the visit note.  Take 1 tablet daily except 1 1/2 every other Wednesday.  Re-check in the lab in 4 weeks.

## 2017-04-26 NOTE — Progress Notes (Signed)
Agree with management.  Judy Decock [name], MD  

## 2017-05-15 ENCOUNTER — Ambulatory Visit (INDEPENDENT_AMBULATORY_CARE_PROVIDER_SITE_OTHER): Payer: Medicare Other | Admitting: *Deleted

## 2017-05-15 VITALS — BP 136/82 | HR 60 | Resp 20 | Ht 65.0 in | Wt 194.0 lb

## 2017-05-15 DIAGNOSIS — Z0000 Encounter for general adult medical examination without abnormal findings: Secondary | ICD-10-CM

## 2017-05-15 NOTE — Progress Notes (Addendum)
Subjective:   Judy [name] is a 83 y.o. female who presents for Medicare Annual (Subsequent) preventive examination.  Review of Systems:  No ROS.  Medicare Wellness Visit. Additional risk factors are reflected in the social history.  Cardiac Risk Factors include: advanced age (>56men, >59 women);dyslipidemia;hypertension Sleep patterns: feels rested on waking, gets up 1 times nightly to void and sleeps 7-8 hours nightly.    Home Safety/Smoke Alarms: Feels safe in home. Smoke alarms in place.  Living environment; residence and Firearm Safety: 1-story house/ trailer, no firearms. Lives alone*, no needs for DME, good support system Seat Belt Safety/Bike Helmet: Wears seat belt.     Objective:     Vitals: BP 136/82   Pulse 60   Resp 20   Ht 5\' 5"  (1.651 m)   Wt 194 lb (88 kg)   SpO2 99%   BMI 32.28 kg/m   Body mass index is 32.28 kg/m.  Advanced Directives 05/15/2017 11/27/2016  Does Patient Have a Medical Advance Directive? No Yes  Type of Advance Directive - Living will  Does patient want to make changes to medical advance directive? - No - Patient declined  Would patient like information on creating a medical advance directive? Yes (ED - Information included in AVS) -    Tobacco Social History   Tobacco Use  Smoking Status Never Smoker  Smokeless Tobacco Never Used     Counseling given: Not Answered  Past Medical History:  Diagnosis Date  . Atrial fibrillation ([place])    a. chronic anticoag, failed prior DCCV;  b. 03/2010 Echo: EF 55-65%, mod MR, mildly to mod dil LA, mod dil RA, mild to mod MR.  Marland Kitchen CAD (coronary artery disease)    a. BMS to RCA 96';  b. Stent PDA and OM DES 2006  . CAROTID ARTERY DISEASE    a. 11/2011 Carotid U/S:  0-39% bilat dzs.  Marland Kitchen HTN (hypertension)   . Hyperlipidemia   . LBBB (left bundle branch block)   . Macular degeneration, wet ([place]) 10/2012 dx   L>R, follows with retinal specialist->injections monthly.  . Obesity   . Vertigo     Past Surgical History:  Procedure Laterality Date  . [place], 2006 x2   Drug-eluting stent placement to the PDA, drug-eluting stent  placement to the first obtuse  marginal, StarClose closure of the right common femoral arteriotomy site. Successful drug-eluting stent  placement in  both the posterior descending artery and the obtuse marginal. The lesion was directly stented using a 2.5 x 16 mm Taxus deployed at 14 atmospheres.   Marland Kitchen EYE SURGERY     Tear duct stent   Family History  Problem Relation Age of Onset  . Arthritis Mother        died in her 50's  . Arthritis Father        died in [place].  Marland Kitchen CAD Brother        deceased  . CAD Brother        deceased  . CAD Sister        deceased   Social History   Socioeconomic History  . Marital status: Widowed    Spouse name: None  . Number of children: None  . Years of education: None  . Highest education level: None  Social Needs  . Financial resource strain: None  . Food insecurity - worry: None  . Food insecurity - inability: None  . Transportation needs - medical: None  .  Transportation needs - non-medical: None  Occupational History  . None  Tobacco Use  . Smoking status: Never Smoker  . Smokeless tobacco: Never Used  Substance and Sexual Activity  . Alcohol use: No  . Drug use: No  . Sexual activity: None  Other Topics Concern  . None  Social History Narrative   Lives in [place] by herself.  She is active around the house w/o symptoms or limitations.    Outpatient Encounter Medications as of 05/15/2017  Medication Sig  . acetaminophen (TYLENOL) 500 MG tablet Take 500 mg by mouth every 6 (six) hours as needed.  Marland Kitchen aspirin 81 MG tablet Take 81 mg by mouth daily.    Marland Kitchen azelastine (ASTELIN) 0.1 % nasal spray Place 2 sprays into the nose 2 (two) times daily as needed for rhinitis or allergies.  Marland Kitchen b complex vitamins tablet Take 1 tablet by mouth daily.  Marland Kitchen diltiazem (CARDIZEM CD) 120 MG 24 hr capsule  TAKE ONE CAPSULE BY MOUTH NIGHTLY AT BEDTIME  . Fexofenadine HCl (ALLEGRA ALLERGY PO) Take 1 tablet by mouth daily.   . furosemide (LASIX) 20 MG tablet TAKE ONE TABLET BY MOUTH ONE TIME DAILY  . isosorbide mononitrate (IMDUR) 30 MG 24 hr tablet Take 1 tablet (30 mg total) by mouth at bedtime.  Marland Kitchen lisinopril (PRINIVIL,ZESTRIL) 10 MG tablet Take 1 tablet (10 mg total) by mouth daily.  . meclizine (ANTIVERT) 25 MG tablet Take 1 tablet (25 mg total) by mouth 3 (three) times daily as needed for dizziness.  . methocarbamol (ROBAXIN) 500 MG tablet TAKE 1 TABLET BY MOUTH EVERY 8 HOURS AS NEEDED FOR MUSCLE SPASMS  . metoprolol succinate (TOPROL-XL) 100 MG 24 hr tablet TAKE 1 TABLET (100 MG TOTAL) BY MOUTH DAILY.  . Multiple Vitamin (MULTIVITAMIN WITH MINERALS) TABS tablet Take 1 tablet by mouth daily.  Marland Kitchen NITROSTAT 0.4 MG SL tablet PLACE 1 TABLET UNDER TONGUE EVERY 5 MINUTES AS NEEDED FOR CHEST PAIN  . potassium chloride (MICRO-K) 10 MEQ CR capsule TAKE 1 CAPSULE (10 MEQ TOTAL) BY MOUTH DAILY.  . pravastatin (PRAVACHOL) 80 MG tablet TAKE 1 TABLET (80 MG TOTAL) BY MOUTH AT BEDTIME.  Marland Kitchen tobramycin (TOBREX) 0.3 % ophthalmic solution Place 1 drop into both eyes daily as needed.  . warfarin (COUMADIN) 2.5 MG tablet TAKE AS DIRECTED BY ANTICOAGULATION CLINIC  . [DISCONTINUED] acetic acid (VOSOL) 2 % otic solution Place 4 drops into the left ear 3 (three) times daily. (Patient not taking: Reported on 05/15/2017)   No facility-administered encounter medications on file as of 05/15/2017.     Activities of Daily Living In your present state of health, do you have any difficulty performing the following activities: 05/15/2017  Hearing? N  Vision? N  Difficulty concentrating or making decisions? N  Walking or climbing stairs? Y  Dressing or bathing? N  Doing errands, shopping? Y  Preparing Food and eating ? N  Using the Toilet? N  In the past six months, have you accidently leaked urine? N  Do you have problems  with loss of bowel control? N  Managing your Medications? N  Managing your Finances? N  Housekeeping or managing your Housekeeping? N  Some recent data might be hidden     Patient Care Team: Binnie Rail, MD as PCP - General (Internal Medicine) Josue Hector, MD (Cardiology) Netta Cedars, MD (Orthopedic Surgery) Sherlynn Stalls, MD (Ophthalmology)    Assessment:    Physical assessment deferred to PCP.  Exercise Activities and Dietary  recommendations Current Exercise Habits: The patient does not participate in regular exercise at present(chair exercise pamphlet provided), Exercise limited by: orthopedic condition(s) Diet (meal preparation, eat out, water intake, caffeinated beverages, dairy products, fruits and vegetables): in general, a "healthy" diet  , well balanced  Reviewed heart healthy diet, encouraged patient to increase daily water intake.  Goals    . Patient Stated     Be as healthy and as independent as possible, maintain mental sharpness by continuing to do bible study and crossword puzzles.       Fall Risk Fall Risk  05/15/2017 02/07/2017 09/22/2015 05/28/2013  Falls in the past year? Yes Yes No Yes  Number falls in past yr: 1 2 or more - 1  Injury with Fall? - No - No  Risk for fall due to : - History of fall(s);Impaired balance/gait - Impaired vision    Depression Screen PHQ 2/9 Scores 05/15/2017 02/07/2017 09/22/2015 09/21/2014  PHQ - 2 Score 0 0 0 0  PHQ- 9 Score 0 - - -     Cognitive Function MMSE - Mini Mental State Exam 05/15/2017  Orientation to time 5  Orientation to Place 5  Registration 3  Attention/ Calculation 5  Recall 2  Language- name 2 objects 2  Language- repeat 1  Language- follow 3 step command 3  Language- read & follow direction 1  Write a sentence 1  Copy design 1  Total score 29        Immunization History  Administered Date(s) Administered  . Influenza Split 03/22/2012  . Influenza Whole 03/21/2011  . Influenza, High  Dose Seasonal PF 04/06/2015, 02/18/2016, 03/02/2017  . Influenza-Unspecified 03/12/2013  . Pneumococcal Conjugate-13 09/21/2014  . Pneumococcal Polysaccharide-23 05/28/2013  . Zoster 06/12/2009   Screening Tests Health Maintenance  Topic Date Due  . OPHTHALMOLOGY EXAM  08/03/1942  . TETANUS/TDAP  08/04/1951  . HEMOGLOBIN A1C  02/04/2017  . FOOT EXAM  02/07/2018  . INFLUENZA VACCINE  Completed  . DEXA SCAN  Completed  . PNA vac Low Risk Adult  Completed       Plan:  Continue doing brain stimulating activities (puzzles, reading, adult coloring books, staying active) to keep memory sharp.   Continue to eat heart healthy diet (full of fruits, vegetables, whole grains, lean protein, water--limit salt, fat, and sugar intake) and increase physical activity as tolerated.   I have personally reviewed and noted the following in the patient's chart:   . Medical and social history . Use of alcohol, tobacco or illicit drugs  . Current medications and supplements . Functional ability and status . Nutritional status . Physical activity . Advanced directives . List of other physicians . Vitals . Screenings to include cognitive, depression, and falls . Referrals and appointments  In addition, I have reviewed and discussed with patient certain preventive protocols, quality metrics, and best practice recommendations. A written personalized care plan for preventive services as well as general preventive health recommendations were provided to patient.     Michiel Cowboy, RN  05/15/2017   Medical screening examination/treatment/procedure(s) were performed by non-physician practitioner and as supervising physician I was immediately available for consultation/collaboration. I agree with above. Binnie Rail, MD

## 2017-05-15 NOTE — Patient Instructions (Addendum)
Classical Stretch: The [facility]enterology Technique Tuesday, October 23, 05:30 am on [facility] Duration: 0:26:46 Description: CLASSICAL STRETCH: THE [facility] TECHNIQUE focuses on overall wellness and physical fitness. This series, hosted by [employer], features a graceful, fluid and controlled method of stretching the entire body. It combines yoga, tai chi, Pilates and ballet techniques, plus specific movements and stretches that reach muscles and ligaments not normally targeted in the average fitness program. The movements, designed in consultation with a physician and a physiotherapist, are simple, safe, effective and appropriate for all ages and fitness levels. [HD][CC][DVS] Broadcast In: [place] Website: [email or web address].[email or web address]  Continue doing brain stimulating activities (puzzles, reading, adult coloring books, staying active) to keep memory sharp.   Continue to eat heart healthy diet (full of fruits, vegetables, whole grains, lean protein, water--limit salt, fat, and sugar intake) and increase physical activity as tolerated.   Judy [name] , Thank you for taking time to come for your Medicare Wellness Visit. I appreciate your ongoing commitment to your health goals. Please review the following plan we discussed and let me know if I can assist you in the future.   These are the goals we discussed: Goals    . Patient Stated     Be as healthy and as independent as possible, maintain mental sharpness by continuing to do bible study and crossword puzzles.        This is a list of the screening recommended for you and due dates:  Health Maintenance  Topic Date Due  . Eye exam for diabetics  08/03/1942  . Tetanus Vaccine  08/04/1951  . Hemoglobin A1C  02/04/2017  . Complete foot exam   02/07/2018  . Flu Shot  Completed  . DEXA scan (bone density measurement)  Completed  . Pneumonia vaccines  Completed

## 2017-05-16 ENCOUNTER — Ambulatory Visit (INDEPENDENT_AMBULATORY_CARE_PROVIDER_SITE_OTHER): Payer: Medicare Other | Admitting: General Practice

## 2017-05-16 ENCOUNTER — Other Ambulatory Visit: Payer: Self-pay | Admitting: General Practice

## 2017-05-16 DIAGNOSIS — I4891 Unspecified atrial fibrillation: Secondary | ICD-10-CM | POA: Diagnosis not present

## 2017-05-16 DIAGNOSIS — Z7901 Long term (current) use of anticoagulants: Secondary | ICD-10-CM | POA: Diagnosis not present

## 2017-05-16 LAB — POCT INR: INR: 3.4

## 2017-05-16 MED ORDER — WARFARIN SODIUM 1 MG PO TABS: 1.0000 mg | ORAL_TABLET | Freq: Every day | ORAL | 0 refills | Status: DC

## 2017-05-16 NOTE — Patient Instructions (Addendum)
Pre visit review using our clinic review tool, if applicable. No additional management support is needed unless otherwise documented below in the visit note.  Skip dosage today (12/5) and then take 1 (2.5) mg tablet daily except take 1 (2.5) mg and 1 (1 mg) tablet on Wednesdays.     Re-check in 4 weeks.

## 2017-05-18 NOTE — Progress Notes (Signed)
Agree.  Stacy J Burns, MD  

## 2017-05-25 ENCOUNTER — Other Ambulatory Visit: Payer: Self-pay | Admitting: Cardiovascular Disease

## 2017-05-25 ENCOUNTER — Other Ambulatory Visit: Payer: Self-pay | Admitting: Internal Medicine

## 2017-05-29 DIAGNOSIS — H353231 Exudative age-related macular degeneration, bilateral, with active choroidal neovascularization: Secondary | ICD-10-CM | POA: Diagnosis not present

## 2017-05-29 DIAGNOSIS — H43813 Vitreous degeneration, bilateral: Secondary | ICD-10-CM | POA: Diagnosis not present

## 2017-05-29 DIAGNOSIS — H35423 Microcystoid degeneration of retina, bilateral: Secondary | ICD-10-CM | POA: Diagnosis not present

## 2017-05-29 DIAGNOSIS — H35372 Puckering of macula, left eye: Secondary | ICD-10-CM | POA: Diagnosis not present

## 2017-06-03 ENCOUNTER — Other Ambulatory Visit: Payer: Self-pay | Admitting: Cardiovascular Disease

## 2017-06-14 ENCOUNTER — Other Ambulatory Visit: Payer: Self-pay | Admitting: General Practice

## 2017-06-14 ENCOUNTER — Other Ambulatory Visit: Payer: Self-pay | Admitting: Internal Medicine

## 2017-06-14 MED ORDER — WARFARIN SODIUM 1 MG PO TABS: ORAL_TABLET | ORAL | 0 refills | Status: DC

## 2017-06-15 ENCOUNTER — Ambulatory Visit (INDEPENDENT_AMBULATORY_CARE_PROVIDER_SITE_OTHER): Payer: Medicare Other | Admitting: General Practice

## 2017-06-15 DIAGNOSIS — I4891 Unspecified atrial fibrillation: Secondary | ICD-10-CM | POA: Diagnosis not present

## 2017-06-15 DIAGNOSIS — Z7901 Long term (current) use of anticoagulants: Secondary | ICD-10-CM

## 2017-06-15 LAB — POCT INR: INR: 3.2

## 2017-06-15 NOTE — Patient Instructions (Addendum)
Pre visit review using our clinic review tool, if applicable. No additional management support is needed unless otherwise documented below in the visit note.  Skip dosage today (1/4) and then take 1 (2.5) mg tablet daily.  Re-check in 3 weeks.

## 2017-06-15 NOTE — Progress Notes (Signed)
Agree with management.  Judy [name] J Judy Wellnitz, MD  

## 2017-06-22 ENCOUNTER — Other Ambulatory Visit: Payer: Self-pay | Admitting: Cardiovascular Disease

## 2017-07-06 ENCOUNTER — Ambulatory Visit (INDEPENDENT_AMBULATORY_CARE_PROVIDER_SITE_OTHER): Payer: Medicare Other | Admitting: General Practice

## 2017-07-06 DIAGNOSIS — I4891 Unspecified atrial fibrillation: Secondary | ICD-10-CM

## 2017-07-06 DIAGNOSIS — Z7901 Long term (current) use of anticoagulants: Secondary | ICD-10-CM

## 2017-07-06 LAB — POCT INR: INR: 2.6

## 2017-07-06 NOTE — Patient Instructions (Addendum)
Pre visit review using our clinic review tool, if applicable. No additional management support is needed unless otherwise documented below in the visit note.  Continue to take 1 (2.5) mg tablet daily.  Re-check in 3 weeks.

## 2017-07-07 NOTE — Progress Notes (Signed)
Agree with management.  Judy [name] J Bana Borgmeyer, MD  

## 2017-07-19 ENCOUNTER — Other Ambulatory Visit: Payer: Self-pay | Admitting: Cardiovascular Disease

## 2017-08-05 NOTE — Progress Notes (Signed)
Subjective:    Patient ID: Judy [name], female    DOB: Jul 21, 1930, 84 y.o.   MRN: [id number]  HPI The patient is here for follow up.  CAD, Hypertension: She is taking her medication daily. She is compliant with a low sodium diet.  She denies chest pain, palpitations, edema, shortness of breath and regular headaches. She is not exercising regularly.  She does not monitor her blood pressure at home.    Diabetes: She is controlling her sugars with diet. She is compliant with a diabetic diet. She is not exercising regularly. She checks her feet daily and denies foot lesions. She is up-to-date with an ophthalmology examination.   Hyperlipidemia: She is taking her medication daily. She is compliant with a low fat/cholesterol diet. She is not exercising regularly. She denies myalgias.   Allergic rhinitis:  She is taking an oral antihistamine, but does not feel it is working.  He is experiencing nasal congestion, postnasal drip and itchy, watery eyes.  She denies any fevers, chills, cough, wheeze or shortness of breath.  Medications and allergies reviewed with patient and updated if appropriate.  Patient Active Problem List   Diagnosis Date Noted  . Diabetes ([place]) 09/27/2015  . Numbness and tingling of foot 09/22/2015  . Encounter for therapeutic drug monitoring 07/25/2013  . Macular degeneration, wet ([place])   . LBBB (left bundle branch block) 03/21/2011  . Long term (current) use of anticoagulants 09/08/2010  . Carotid artery disease ([place]) 08/31/2009  . OBESITY 08/23/2009  . Mixed hyperlipidemia 12/16/2008  . Essential hypertension, benign 12/16/2008  . CAD, NATIVE VESSEL 12/16/2008  . ATRIAL FIBRILLATION 12/16/2008    Current Outpatient Medications on File Prior to Visit  Medication Sig Dispense Refill  . acetaminophen (TYLENOL) 500 MG tablet Take 500 mg by mouth every 6 (six) hours as needed.    Marland Kitchen aspirin 81 MG tablet Take 81 mg by mouth daily.      Marland Kitchen azelastine (ASTELIN) 0.1 %  nasal spray Place 2 sprays into the nose 2 (two) times daily as needed for rhinitis or allergies.    Marland Kitchen b complex vitamins tablet Take 1 tablet by mouth daily.    Marland Kitchen diltiazem (CARDIZEM CD) 120 MG 24 hr capsule TAKE ONE CAPSULE BY MOUTH NIGHTLY AT BEDTIME 90 capsule 3  . Fexofenadine HCl (ALLEGRA ALLERGY PO) Take 1 tablet by mouth daily.     . furosemide (LASIX) 20 MG tablet TAKE ONE TABLET BY MOUTH ONE TIME DAILY 90 tablet 1  . isosorbide mononitrate (IMDUR) 30 MG 24 hr tablet TAKE 1 TABLET (30 MG TOTAL) BY MOUTH AT BEDTIME. 90 tablet 1  . lisinopril (PRINIVIL,ZESTRIL) 10 MG tablet TAKE 1 TABLET BY MOUTH DAILY 90 tablet 1  . meclizine (ANTIVERT) 25 MG tablet Take 1 tablet (25 mg total) by mouth 3 (three) times daily as needed for dizziness. 30 tablet 0  . methocarbamol (ROBAXIN) 500 MG tablet TAKE 1 TABLET BY MOUTH EVERY 8 HOURS AS NEEDED FOR MUSCLE SPASMS 30 tablet 1  . metoprolol succinate (TOPROL-XL) 100 MG 24 hr tablet TAKE 1 TABLET (100 MG TOTAL) BY MOUTH DAILY. 90 tablet 1  . Multiple Vitamin (MULTIVITAMIN WITH MINERALS) TABS tablet Take 1 tablet by mouth daily.    Marland Kitchen NITROSTAT 0.4 MG SL tablet PLACE 1 TABLET UNDER TONGUE EVERY 5 MINUTES AS NEEDED FOR CHEST PAIN 25 tablet 8  . potassium chloride (MICRO-K) 10 MEQ CR capsule TAKE 1 CAPSULE (10 MEQ TOTAL) BY MOUTH DAILY. 90 capsule  2  . pravastatin (PRAVACHOL) 80 MG tablet TAKE 1 TABLET (80 MG TOTAL) BY MOUTH AT BEDTIME. 90 tablet 1  . tobramycin (TOBREX) 0.3 % ophthalmic solution Place 1 drop into both eyes daily as needed.    . warfarin (COUMADIN) 1 MG tablet TAKE 1 TABLET BY MOUTH DAILY (TAKE IN ADDITION TO 2.5MG ) 30 tablet 0  . warfarin (COUMADIN) 1 MG tablet Take 1 tablet on Wednesdays .Patient uses 1 mg tabs in addition to 2.5 mg tabs. 50 tablet 0  . warfarin (COUMADIN) 2.5 MG tablet TAKE AS DIRECTED BY ANTICOAGULATION CLINIC 90 tablet 1   No current facility-administered medications on file prior to visit.     Past Medical History:    Diagnosis Date  . Atrial fibrillation ([place])    a. chronic anticoag, failed prior DCCV;  b. 03/2010 Echo: EF 55-65%, mod MR, mildly to mod dil LA, mod dil RA, mild to mod MR.  Marland Kitchen CAD (coronary artery disease)    a. BMS to RCA 96';  b. Stent PDA and OM DES 2006  . CAROTID ARTERY DISEASE    a. 11/2011 Carotid U/S:  0-39% bilat dzs.  Marland Kitchen HTN (hypertension)   . Hyperlipidemia   . LBBB (left bundle branch block)   . Macular degeneration, wet ([place]) 10/2012 dx   L>R, follows with retinal specialist->injections monthly.  . Obesity   . Vertigo     Past Surgical History:  Procedure Laterality Date  . [place], 2006 x2   Drug-eluting stent placement to the PDA, drug-eluting stent  placement to the first obtuse  marginal, StarClose closure of the right common femoral arteriotomy site. Successful drug-eluting stent  placement in  both the posterior descending artery and the obtuse marginal. The lesion was directly stented using a 2.5 x 16 mm Taxus deployed at 14 atmospheres.   Marland Kitchen EYE SURGERY     Tear duct stent    Social History   Socioeconomic History  . Marital status: Widowed    Spouse name: None  . Number of children: None  . Years of education: None  . Highest education level: None  Social Needs  . Financial resource strain: None  . Food insecurity - worry: None  . Food insecurity - inability: None  . Transportation needs - medical: None  . Transportation needs - non-medical: None  Occupational History  . None  Tobacco Use  . Smoking status: Never Smoker  . Smokeless tobacco: Never Used  Substance and Sexual Activity  . Alcohol use: No  . Drug use: No  . Sexual activity: None  Other Topics Concern  . None  Social History Narrative   Lives in [place] by herself.  She is active around the house w/o symptoms or limitations.    Family History  Problem Relation Age of Onset  . Arthritis Mother        died in her 84's  . Arthritis Father        died in [place].   Marland Kitchen CAD Brother        deceased  . CAD Brother        deceased  . CAD Sister        deceased    Review of Systems  Constitutional: Negative for appetite change and fever.  HENT: Positive for congestion and postnasal drip.   Eyes: Positive for itching (water eyes - allergies).  Respiratory: Negative for cough, shortness of breath and wheezing.   Cardiovascular: Negative for chest pain,  palpitations and leg swelling.  Neurological: Negative for light-headedness and headaches.       Objective:   Vitals:   08/06/17 0836  BP: (!) 142/80  Pulse: 68  Resp: 16  Temp: 97.9 F (36.6 C)  SpO2: 98%   Wt Readings from Last 3 Encounters:  08/06/17 194 lb (88 kg)  05/15/17 194 lb (88 kg)  02/07/17 193 lb (87.5 kg)   BP Readings from Last 3 Encounters:  08/06/17 (!) 142/80  05/15/17 136/82  02/07/17 128/88     Body mass index is 32.28 kg/m.   Physical Exam    Constitutional: Appears well-developed and well-nourished. No distress.  HENT:  Head: Normocephalic and atraumatic.  Neck: Neck supple. No tracheal deviation present. No thyromegaly present.  No cervical lymphadenopathy Cardiovascular: Normal rate, regular rhythm and normal heart sounds.   No murmur heard. No carotid bruit .  No edema Pulmonary/Chest: Effort normal and breath sounds normal. No respiratory distress. No has no wheezes. No rales.  Skin: Skin is warm and dry. Not diaphoretic.  Psychiatric: Normal mood and affect. Behavior is normal.      Assessment & Plan:    See Problem List for Assessment and Plan of chronic medical problems.

## 2017-08-05 NOTE — Patient Instructions (Addendum)
For your allergies try taking: An oral anti-histamine - allegra, zyrtec, xyzal or claritin Take a steroid based nasal spray - flonase, Nasacort, nasonex Use an anti-histamine nasal spray - Astelin   Test(s) ordered today. Your results will be released to Judy [name] (or called to you) after review, usually within 72hours after test completion. If any changes need to be made, you will be notified at that same time.  All other Health Maintenance issues reviewed.   All recommended immunizations and age-appropriate screenings are up-to-date or discussed.  No immunizations administered today.   Medications reviewed and updated.  No changes recommended at this time.   Please followup in 6 months

## 2017-08-06 ENCOUNTER — Other Ambulatory Visit (INDEPENDENT_AMBULATORY_CARE_PROVIDER_SITE_OTHER): Payer: Medicare Other

## 2017-08-06 ENCOUNTER — Ambulatory Visit (INDEPENDENT_AMBULATORY_CARE_PROVIDER_SITE_OTHER): Payer: Medicare Other | Admitting: Internal Medicine

## 2017-08-06 ENCOUNTER — Encounter: Payer: Self-pay | Admitting: Internal Medicine

## 2017-08-06 VITALS — BP 142/80 | HR 68 | Temp 97.9°F | Resp 16 | Ht 65.0 in | Wt 194.0 lb

## 2017-08-06 DIAGNOSIS — E119 Type 2 diabetes mellitus without complications: Secondary | ICD-10-CM | POA: Diagnosis not present

## 2017-08-06 DIAGNOSIS — I2510 Atherosclerotic heart disease of native coronary artery without angina pectoris: Secondary | ICD-10-CM | POA: Diagnosis not present

## 2017-08-06 DIAGNOSIS — E782 Mixed hyperlipidemia: Secondary | ICD-10-CM

## 2017-08-06 DIAGNOSIS — J3089 Other allergic rhinitis: Secondary | ICD-10-CM

## 2017-08-06 DIAGNOSIS — I10 Essential (primary) hypertension: Secondary | ICD-10-CM | POA: Diagnosis not present

## 2017-08-06 DIAGNOSIS — J309 Allergic rhinitis, unspecified: Secondary | ICD-10-CM | POA: Insufficient documentation

## 2017-08-06 LAB — LDL CHOLESTEROL, DIRECT: LDL DIRECT: 114 mg/dL

## 2017-08-06 LAB — COMPREHENSIVE METABOLIC PANEL
ALBUMIN: 4.1 g/dL (ref 3.5–5.2)
ALT: 16 U/L (ref 0–35)
AST: 17 U/L (ref 0–37)
Alkaline Phosphatase: 71 U/L (ref 39–117)
BILIRUBIN TOTAL: 0.5 mg/dL (ref 0.2–1.2)
BUN: 13 mg/dL (ref 6–23)
CO2: 25 mEq/L (ref 19–32)
CREATININE: 0.95 mg/dL (ref 0.40–1.20)
Calcium: 9.8 mg/dL (ref 8.4–10.5)
Chloride: 107 mEq/L (ref 96–112)
GFR: 59.42 mL/min — ABNORMAL LOW (ref >60.00)
Glucose, Bld: 122 mg/dL — ABNORMAL HIGH (ref 70–99)
Potassium: 3.9 mEq/L (ref 3.5–5.1)
SODIUM: 143 meq/L (ref 135–145)
TOTAL PROTEIN: 7.3 g/dL (ref 6.0–8.3)

## 2017-08-06 LAB — CBC WITH DIFFERENTIAL/PLATELET
Basophils Absolute: 0.1 10*3/uL (ref 0.0–0.1)
Basophils Relative: 0.7 % (ref 0.0–3.0)
EOS ABS: 0.3 10*3/uL (ref 0.0–0.7)
Eosinophils Relative: 3.3 % (ref 0.0–5.0)
HCT: 41 % (ref 36.0–46.0)
HEMOGLOBIN: 13.9 g/dL (ref 12.0–15.0)
Lymphocytes Relative: 25.8 % (ref 12.0–46.0)
Lymphs Abs: 2.1 10*3/uL (ref 0.7–4.0)
MCHC: 33.9 g/dL (ref 30.0–36.0)
MCV: 90.1 fl (ref 78.0–100.0)
MONO ABS: 0.7 10*3/uL (ref 0.1–1.0)
Monocytes Relative: 8.8 % (ref 3.0–12.0)
Neutro Abs: 5 10*3/uL (ref 1.4–7.7)
Neutrophils Relative %: 61.4 % (ref 43.0–77.0)
PLATELETS: 239 10*3/uL (ref 150.0–400.0)
RBC: 4.55 Mil/uL (ref 3.87–5.11)
RDW: 13.8 % (ref 11.5–15.5)
WBC: 8.2 10*3/uL (ref 4.0–10.5)

## 2017-08-06 LAB — LIPID PANEL
CHOLESTEROL: 211 mg/dL — AB (ref 0–200)
HDL: 48 mg/dL (ref >39.00)
NonHDL: 163.38
Total CHOL/HDL Ratio: 4
Triglycerides: 273 mg/dL — ABNORMAL HIGH (ref 0.0–149.0)
VLDL: 54.6 mg/dL — ABNORMAL HIGH (ref 0.0–40.0)

## 2017-08-06 LAB — HEMOGLOBIN A1C: Hgb A1c MFr Bld: 6.6 % — ABNORMAL HIGH (ref 4.6–6.5)

## 2017-08-06 NOTE — Assessment & Plan Note (Signed)
Diet controlled Continue diabetic diet Not currently exercising regularly, regular exercise encouraged Check A1c Up-to-date with eye appointment and will have report sent-next appointment next week Follow-up in 6 months

## 2017-08-06 NOTE — Assessment & Plan Note (Signed)
Check lipid panel  Continue daily statin Regular exercise and healthy diet encouraged  

## 2017-08-06 NOTE — Assessment & Plan Note (Signed)
She has some year-round allergies Currently taking an oral antihistamine-advised she may want to switch this to a different one Using a nasal spray-unsure which one Discussed using Astelin in addition to a steroid-based nasal spray Can refer to allergy if she wishes

## 2017-08-06 NOTE — Assessment & Plan Note (Signed)
No concerning symptoms including chest pain, palpitations or shortness of breath Continue current medications at current doses CMP, lipid panel

## 2017-08-06 NOTE — Assessment & Plan Note (Signed)
Blood pressure slightly elevated here today, but has been well controlled overall Continue current medications at current doses CMP

## 2017-08-07 DIAGNOSIS — H35373 Puckering of macula, bilateral: Secondary | ICD-10-CM | POA: Diagnosis not present

## 2017-08-07 DIAGNOSIS — H43813 Vitreous degeneration, bilateral: Secondary | ICD-10-CM | POA: Diagnosis not present

## 2017-08-07 DIAGNOSIS — H353231 Exudative age-related macular degeneration, bilateral, with active choroidal neovascularization: Secondary | ICD-10-CM | POA: Diagnosis not present

## 2017-08-07 DIAGNOSIS — H35423 Microcystoid degeneration of retina, bilateral: Secondary | ICD-10-CM | POA: Diagnosis not present

## 2017-08-08 ENCOUNTER — Encounter: Payer: Self-pay | Admitting: Emergency Medicine

## 2017-08-10 ENCOUNTER — Ambulatory Visit (INDEPENDENT_AMBULATORY_CARE_PROVIDER_SITE_OTHER): Payer: Medicare Other | Admitting: General Practice

## 2017-08-10 DIAGNOSIS — Z7901 Long term (current) use of anticoagulants: Secondary | ICD-10-CM

## 2017-08-10 DIAGNOSIS — I4891 Unspecified atrial fibrillation: Secondary | ICD-10-CM | POA: Diagnosis not present

## 2017-08-10 LAB — POCT INR: INR: 3.3

## 2017-08-10 NOTE — Patient Instructions (Signed)
Pre visit review using our clinic review tool, if applicable. No additional management support is needed unless otherwise documented below in the visit note.  Skip coumadin today continue to take 1 (2.5) mg tablet daily.  Re-check in 3 weeks.

## 2017-08-11 NOTE — Progress Notes (Signed)
Agree with management.  Devonna Oboyle J Cianni Manny, MD  

## 2017-08-25 ENCOUNTER — Other Ambulatory Visit: Payer: Self-pay | Admitting: Internal Medicine

## 2017-08-31 ENCOUNTER — Ambulatory Visit (INDEPENDENT_AMBULATORY_CARE_PROVIDER_SITE_OTHER): Payer: Medicare Other | Admitting: General Practice

## 2017-08-31 DIAGNOSIS — Z7901 Long term (current) use of anticoagulants: Secondary | ICD-10-CM | POA: Diagnosis not present

## 2017-08-31 DIAGNOSIS — I4891 Unspecified atrial fibrillation: Secondary | ICD-10-CM | POA: Diagnosis not present

## 2017-08-31 LAB — POCT INR: INR: 2.8

## 2017-08-31 NOTE — Progress Notes (Signed)
error 

## 2017-08-31 NOTE — Patient Instructions (Addendum)
Pre visit review using our clinic review tool, if applicable. No additional management support is needed unless otherwise documented below in the visit note.  Take 1/2 tablet today (3/22) and then take 1 (2.5) mg tablet daily except 1/2 tablet (1.25 mg) every Friday.  Re-check in 4 weeks.

## 2017-09-18 DIAGNOSIS — M7541 Impingement syndrome of right shoulder: Secondary | ICD-10-CM | POA: Diagnosis not present

## 2017-09-18 DIAGNOSIS — M25511 Pain in right shoulder: Secondary | ICD-10-CM | POA: Diagnosis not present

## 2017-09-18 NOTE — Progress Notes (Deleted)
Patient ID: Judy [name], female   DOB: 09/17/1930, 84 y.o.   MRN: [id number]  84 y.o. with history of RCA stenting in 1006 and PDA/OM in 2006.  Chronic afib anticoagulated with coumadin. She has macular degeneration and gets shots in both eyes. Some neuropathy in legs. No bleeding issues on coumadin. Still active and gets around pretty well. Compliant with meds No angina, dyspnea, palpitations or syncope  ***  General: There were no vitals taken for this visit. Affect appropriate Elderly white female  HEENT: normal Neck supple with no adenopathy JVP normal no bruits no thyromegaly Lungs clear with no wheezing and good diaphragmatic motion Heart:  S1/S2 no murmur, no rub, gallop or click PMI normal Abdomen: benighn, BS positve, no tenderness, no AAA no bruit.  No HSM or HJR Distal pulses intact with no bruits No edema Neuro non-focal Skin warm and dry No muscular weakness     Current Outpatient Medications  Medication Sig Dispense Refill  . acetaminophen (TYLENOL) 500 MG tablet Take 500 mg by mouth every 6 (six) hours as needed.    Marland Kitchen aspirin 81 MG tablet Take 81 mg by mouth daily.      Marland Kitchen azelastine (ASTELIN) 0.1 % nasal spray Place 2 sprays into the nose 2 (two) times daily as needed for rhinitis or allergies.    Marland Kitchen b complex vitamins tablet Take 1 tablet by mouth daily.    Marland Kitchen diltiazem (CARDIZEM CD) 120 MG 24 hr capsule TAKE ONE CAPSULE BY MOUTH NIGHTLY AT BEDTIME 90 capsule 3  . Fexofenadine HCl (ALLEGRA ALLERGY PO) Take 1 tablet by mouth daily.     . furosemide (LASIX) 20 MG tablet TAKE ONE TABLET BY MOUTH ONE TIME DAILY 90 tablet 1  . isosorbide mononitrate (IMDUR) 30 MG 24 hr tablet TAKE 1 TABLET (30 MG TOTAL) BY MOUTH AT BEDTIME. 90 tablet 1  . lisinopril (PRINIVIL,ZESTRIL) 10 MG tablet TAKE 1 TABLET BY MOUTH DAILY 90 tablet 1  . meclizine (ANTIVERT) 25 MG tablet Take 1 tablet (25 mg total) by mouth 3 (three) times daily as needed for dizziness. 30 tablet 0  . metoprolol  succinate (TOPROL-XL) 100 MG 24 hr tablet TAKE 1 TABLET (100 MG TOTAL) BY MOUTH DAILY. 90 tablet 1  . Multiple Vitamin (MULTIVITAMIN WITH MINERALS) TABS tablet Take 1 tablet by mouth daily.    Marland Kitchen NITROSTAT 0.4 MG SL tablet PLACE 1 TABLET UNDER TONGUE EVERY 5 MINUTES AS NEEDED FOR CHEST PAIN 25 tablet 8  . potassium chloride (MICRO-K) 10 MEQ CR capsule TAKE 1 CAPSULE (10 MEQ TOTAL) BY MOUTH DAILY. 90 capsule 2  . pravastatin (PRAVACHOL) 80 MG tablet TAKE 1 TABLET (80 MG TOTAL) BY MOUTH AT BEDTIME. 90 tablet 1  . tobramycin (TOBREX) 0.3 % ophthalmic solution Place 1 drop into both eyes daily as needed.    . warfarin (COUMADIN) 1 MG tablet TAKE 1 TABLET BY MOUTH DAILY (TAKE IN ADDITION TO 2.5MG ) 30 tablet 0  . warfarin (COUMADIN) 1 MG tablet Take 1 tablet on Wednesdays .Patient uses 1 mg tabs in addition to 2.5 mg tabs. 50 tablet 0  . warfarin (COUMADIN) 2.5 MG tablet TAKE AS DIRECTED BY ANTICOAGULATION CLINIC 90 tablet 1   No current facility-administered medications for this visit.     Allergies  Betadine [povidone iodine]  Electrocardiogram:  afib low voltage non specific ST chages  10/14  03/09/14  afib rate 72 ? Old IMI  04/28/15 afib rate 74 Old IMI 06/30/16 afib rate 100 old  IMI   Assessment and Plan CAD: Stable with no angina and good activity level.  Continue medical Rx Carotid: plaque no stenosis f/u duplex 09/2018 Afib: chronic good rate control and anticoagulation  Anticoagulation:  No bleeding issues INR Rx gets checked every 6 weeks  Chol::  Labs wit hprimary  Lab Results  Component Value Date   LDLCALC 100 (H) 09/22/2015      Jenkins Rouge

## 2017-09-26 ENCOUNTER — Ambulatory Visit: Payer: Medicare Other | Admitting: Cardiovascular Disease

## 2017-10-01 DIAGNOSIS — M25511 Pain in right shoulder: Secondary | ICD-10-CM | POA: Diagnosis not present

## 2017-10-01 DIAGNOSIS — M25512 Pain in left shoulder: Secondary | ICD-10-CM | POA: Diagnosis not present

## 2017-10-03 DIAGNOSIS — M25512 Pain in left shoulder: Secondary | ICD-10-CM | POA: Diagnosis not present

## 2017-10-03 DIAGNOSIS — M25511 Pain in right shoulder: Secondary | ICD-10-CM | POA: Diagnosis not present

## 2017-10-05 ENCOUNTER — Ambulatory Visit (INDEPENDENT_AMBULATORY_CARE_PROVIDER_SITE_OTHER): Payer: Medicare Other | Admitting: General Practice

## 2017-10-05 DIAGNOSIS — I4891 Unspecified atrial fibrillation: Secondary | ICD-10-CM | POA: Diagnosis not present

## 2017-10-05 DIAGNOSIS — Z7901 Long term (current) use of anticoagulants: Secondary | ICD-10-CM | POA: Diagnosis not present

## 2017-10-05 LAB — POCT INR: INR: 2.8

## 2017-10-05 NOTE — Progress Notes (Signed)
Agree with management.  Esperansa Sarabia J Sahory Nordling, MD  

## 2017-10-05 NOTE — Patient Instructions (Addendum)
Pre visit review using our clinic review tool, if applicable. No additional management support is needed unless otherwise documented below in the visit note.  Skip dosage today and then take 1 (2.5) mg tablet daily except 1 (1 mg tablet) on Fridays. Re-check in 4 weeks.

## 2017-10-09 DIAGNOSIS — M25511 Pain in right shoulder: Secondary | ICD-10-CM | POA: Diagnosis not present

## 2017-10-09 DIAGNOSIS — M25512 Pain in left shoulder: Secondary | ICD-10-CM | POA: Diagnosis not present

## 2017-10-12 DIAGNOSIS — M25512 Pain in left shoulder: Secondary | ICD-10-CM | POA: Diagnosis not present

## 2017-10-12 DIAGNOSIS — M25511 Pain in right shoulder: Secondary | ICD-10-CM | POA: Diagnosis not present

## 2017-10-15 DIAGNOSIS — M25511 Pain in right shoulder: Secondary | ICD-10-CM | POA: Diagnosis not present

## 2017-10-17 DIAGNOSIS — M25511 Pain in right shoulder: Secondary | ICD-10-CM | POA: Diagnosis not present

## 2017-10-22 DIAGNOSIS — M25511 Pain in right shoulder: Secondary | ICD-10-CM | POA: Diagnosis not present

## 2017-10-23 DIAGNOSIS — H35372 Puckering of macula, left eye: Secondary | ICD-10-CM | POA: Diagnosis not present

## 2017-10-23 DIAGNOSIS — H35423 Microcystoid degeneration of retina, bilateral: Secondary | ICD-10-CM | POA: Diagnosis not present

## 2017-10-23 DIAGNOSIS — H353231 Exudative age-related macular degeneration, bilateral, with active choroidal neovascularization: Secondary | ICD-10-CM | POA: Diagnosis not present

## 2017-10-23 DIAGNOSIS — H43813 Vitreous degeneration, bilateral: Secondary | ICD-10-CM | POA: Diagnosis not present

## 2017-10-26 DIAGNOSIS — M25511 Pain in right shoulder: Secondary | ICD-10-CM | POA: Diagnosis not present

## 2017-11-09 ENCOUNTER — Ambulatory Visit (INDEPENDENT_AMBULATORY_CARE_PROVIDER_SITE_OTHER): Payer: Medicare Other | Admitting: General Practice

## 2017-11-09 DIAGNOSIS — Z7901 Long term (current) use of anticoagulants: Secondary | ICD-10-CM

## 2017-11-09 DIAGNOSIS — I4891 Unspecified atrial fibrillation: Secondary | ICD-10-CM

## 2017-11-09 LAB — POCT INR: INR: 2.8 (ref 2.0–3.0)

## 2017-11-09 NOTE — Progress Notes (Signed)
Agree with management.  Judy Mathe J [name], MD  

## 2017-11-09 NOTE — Patient Instructions (Addendum)
Pre visit review using our clinic review tool, if applicable. No additional management support is needed unless otherwise documented below in the visit note.   Take 1 (2.5) mg tablet daily except nothing on Fridays.  Re-check in 4 weeks.  

## 2017-11-21 ENCOUNTER — Other Ambulatory Visit: Payer: Self-pay | Admitting: Internal Medicine

## 2017-11-21 ENCOUNTER — Other Ambulatory Visit: Payer: Self-pay | Admitting: Cardiovascular Disease

## 2017-11-21 ENCOUNTER — Other Ambulatory Visit: Payer: Self-pay | Admitting: General Practice

## 2017-11-21 DIAGNOSIS — L821 Other seborrheic keratosis: Secondary | ICD-10-CM | POA: Diagnosis not present

## 2017-11-21 DIAGNOSIS — D2261 Melanocytic nevi of right upper limb, including shoulder: Secondary | ICD-10-CM | POA: Diagnosis not present

## 2017-11-21 DIAGNOSIS — L570 Actinic keratosis: Secondary | ICD-10-CM | POA: Diagnosis not present

## 2017-11-21 DIAGNOSIS — D485 Neoplasm of uncertain behavior of skin: Secondary | ICD-10-CM | POA: Diagnosis not present

## 2017-11-21 DIAGNOSIS — Z85828 Personal history of other malignant neoplasm of skin: Secondary | ICD-10-CM | POA: Diagnosis not present

## 2017-11-21 MED ORDER — WARFARIN SODIUM 2.5 MG PO TABS: ORAL_TABLET | ORAL | 1 refills | Status: DC

## 2017-12-11 ENCOUNTER — Ambulatory Visit (INDEPENDENT_AMBULATORY_CARE_PROVIDER_SITE_OTHER): Payer: Medicare Other | Admitting: General Practice

## 2017-12-11 DIAGNOSIS — I4891 Unspecified atrial fibrillation: Secondary | ICD-10-CM

## 2017-12-11 DIAGNOSIS — Z7901 Long term (current) use of anticoagulants: Secondary | ICD-10-CM | POA: Diagnosis not present

## 2017-12-11 LAB — POCT INR: INR: 2.3 (ref 2.0–3.0)

## 2017-12-11 NOTE — Patient Instructions (Addendum)
Pre visit review using our clinic review tool, if applicable. No additional management support is needed unless otherwise documented below in the visit note.  Take 1 (2.5) mg tablet daily except nothing on Fridays.  Re-check in 4 weeks.

## 2017-12-11 NOTE — Progress Notes (Signed)
Agree with management.  Tierre Gerard J Kashana Breach, MD  

## 2017-12-13 ENCOUNTER — Other Ambulatory Visit: Payer: Self-pay | Admitting: Cardiovascular Disease

## 2018-01-08 DIAGNOSIS — H353231 Exudative age-related macular degeneration, bilateral, with active choroidal neovascularization: Secondary | ICD-10-CM | POA: Diagnosis not present

## 2018-01-08 DIAGNOSIS — H43813 Vitreous degeneration, bilateral: Secondary | ICD-10-CM | POA: Diagnosis not present

## 2018-01-08 DIAGNOSIS — H35423 Microcystoid degeneration of retina, bilateral: Secondary | ICD-10-CM | POA: Diagnosis not present

## 2018-01-08 DIAGNOSIS — H35372 Puckering of macula, left eye: Secondary | ICD-10-CM | POA: Diagnosis not present

## 2018-01-11 ENCOUNTER — Ambulatory Visit (INDEPENDENT_AMBULATORY_CARE_PROVIDER_SITE_OTHER): Payer: Medicare Other | Admitting: General Practice

## 2018-01-11 ENCOUNTER — Other Ambulatory Visit: Payer: Self-pay | Admitting: Cardiovascular Disease

## 2018-01-11 DIAGNOSIS — Z7901 Long term (current) use of anticoagulants: Secondary | ICD-10-CM

## 2018-01-11 DIAGNOSIS — I4891 Unspecified atrial fibrillation: Secondary | ICD-10-CM

## 2018-01-11 LAB — POCT INR: INR: 2.5 (ref 2.0–3.0)

## 2018-01-11 NOTE — Patient Instructions (Addendum)
Pre visit review using our clinic review tool, if applicable. No additional management support is needed unless otherwise documented below in the visit note.  ake 1 (2.5) mg tablet daily except nothing on Fridays.  Re-check in 4 weeks.

## 2018-01-11 NOTE — Progress Notes (Signed)
Agree with management.  Stacy J Burns, MD  

## 2018-02-02 NOTE — Progress Notes (Signed)
Subjective:    Patient ID: Judy [name], female    DOB: [birth year], 84 y.o.   MRN: [id number]  HPI The patient is here for follow up.  She saw orthopedics for her right shoulder and right knee pain.  She had injections in both.  They did help.  Diabetes: She is taking her medication daily as prescribed. She is compliant with a diabetic diet. She is not exercising regularly.  She checks her feet daily and denies foot lesions. She is up-to-date with an ophthalmology examination.   CAD, Afib, Hypertension: She is taking her medication daily. She is compliant with a low sodium diet.  She denies chest pain and regular headaches.  She does have chronic leg edema and her left leg is worse.  She has occasional palpitations from her atrial fibrillation.  She does get short of breath with walking long distances.  She is not exercising regularly.       Hyperlipidemia: She is taking her medication daily. She is compliant with a low fat/cholesterol diet. She is not exercising regularly. She denies myalgias.   Neuropathy:  She feels cold at night. The bottom of her feet are numb, but it does not reach the heel. She denies pain.  It does bother her walking.  It has progressed a little since it first started.  At her last appointment she was not interested in seeing neurology.  Medications and allergies reviewed with patient and updated if appropriate.  Patient Active Problem List   Diagnosis Date Noted  . Allergic rhinitis 08/06/2017  . Diabetes ([place]) 09/27/2015  . Numbness and tingling of foot 09/22/2015  . Encounter for therapeutic drug monitoring 07/25/2013  . Macular degeneration, wet ([place])   . LBBB (left bundle branch block) 03/21/2011  . Long term (current) use of anticoagulants 09/08/2010  . Carotid artery disease ([place]) 08/31/2009  . OBESITY 08/23/2009  . Mixed hyperlipidemia 12/16/2008  . Essential hypertension, benign 12/16/2008  . CAD, NATIVE VESSEL 12/16/2008  . ATRIAL  FIBRILLATION 12/16/2008    Current Outpatient Medications on File Prior to Visit  Medication Sig Dispense Refill  . acetaminophen (TYLENOL) 500 MG tablet Take 500 mg by mouth every 6 (six) hours as needed.    Marland Kitchen aspirin 81 MG tablet Take 81 mg by mouth daily.      Marland Kitchen azelastine (ASTELIN) 0.1 % nasal spray Place 2 sprays into the nose 2 (two) times daily as needed for rhinitis or allergies.    Marland Kitchen b complex vitamins tablet Take 1 tablet by mouth daily.    Marland Kitchen diltiazem (CARDIZEM CD) 120 MG 24 hr capsule TAKE ONE CAPSULE BY MOUTH NIGHTLY AT BEDTIME 90 capsule 3  . Fexofenadine HCl (ALLEGRA ALLERGY PO) Take 1 tablet by mouth daily.     . furosemide (LASIX) 20 MG tablet Take 1 tablet (20 mg total) by mouth daily. Please keep upcoming appt in September with Dr. Johnsie Cancel for future refills. Thank you 90 tablet 0  . isosorbide mononitrate (IMDUR) 30 MG 24 hr tablet TAKE 1 TABLET (30 MG TOTAL) BY MOUTH AT BEDTIME. 90 tablet 2  . lisinopril (PRINIVIL,ZESTRIL) 10 MG tablet TAKE 1 TABLET BY MOUTH DAILY 90 tablet 2  . meclizine (ANTIVERT) 25 MG tablet Take 1 tablet (25 mg total) by mouth 3 (three) times daily as needed for dizziness. 30 tablet 0  . metoprolol succinate (TOPROL-XL) 100 MG 24 hr tablet TAKE 1 TABLET (100 MG TOTAL) BY MOUTH DAILY. 90 tablet 1  . Multiple Vitamin (  MULTIVITAMIN WITH MINERALS) TABS tablet Take 1 tablet by mouth daily.    Marland Kitchen NITROSTAT 0.4 MG SL tablet PLACE 1 TABLET UNDER TONGUE EVERY 5 MINUTES AS NEEDED FOR CHEST PAIN 25 tablet 8  . potassium chloride (MICRO-K) 10 MEQ CR capsule TAKE 1 CAPSULE (10 MEQ TOTAL) BY MOUTH DAILY. 90 capsule 2  . pravastatin (PRAVACHOL) 80 MG tablet TAKE 1 TABLET (80 MG TOTAL) BY MOUTH AT BEDTIME. 90 tablet 0  . tobramycin (TOBREX) 0.3 % ophthalmic solution Place 1 drop into both eyes daily as needed.    . warfarin (COUMADIN) 1 MG tablet TAKE 1 TABLET BY MOUTH DAILY (TAKE IN ADDITION TO 2.5MG ) 30 tablet 0  . warfarin (COUMADIN) 2.5 MG tablet Take 1 tablet daily  or as directed by anticoagulation clinic. 90 tablet 1   No current facility-administered medications on file prior to visit.     Past Medical History:  Diagnosis Date  . Atrial fibrillation ([place])    a. chronic anticoag, failed prior DCCV;  b. 03/2010 Echo: EF 55-65%, mod MR, mildly to mod dil LA, mod dil RA, mild to mod MR.  Marland Kitchen CAD (coronary artery disease)    a. BMS to RCA 96';  b. Stent PDA and OM DES 2006  . CAROTID ARTERY DISEASE    a. 11/2011 Carotid U/S:  0-39% bilat dzs.  Marland Kitchen HTN (hypertension)   . Hyperlipidemia   . LBBB (left bundle branch block)   . Macular degeneration, wet ([place]) 10/2012 dx   L>R, follows with retinal specialist->injections monthly.  . Obesity   . Vertigo     Past Surgical History:  Procedure Laterality Date  . [place], 2006 x2   Drug-eluting stent placement to the PDA, drug-eluting stent  placement to the first obtuse  marginal, StarClose closure of the right common femoral arteriotomy site. Successful drug-eluting stent  placement in  both the posterior descending artery and the obtuse marginal. The lesion was directly stented using a 2.5 x 16 mm Taxus deployed at 14 atmospheres.   Marland Kitchen EYE SURGERY     Tear duct stent    Social History   Socioeconomic History  . Marital status: Widowed    Spouse name: Not on file  . Number of children: Not on file  . Years of education: Not on file  . Highest education level: Not on file  Occupational History  . Not on file  Social Needs  . Financial resource strain: Not on file  . Food insecurity:    Worry: Not on file    Inability: Not on file  . Transportation needs:    Medical: Not on file    Non-medical: Not on file  Tobacco Use  . Smoking status: Never Smoker  . Smokeless tobacco: Never Used  Substance and Sexual Activity  . Alcohol use: No  . Drug use: No  . Sexual activity: Not on file  Lifestyle  . Physical activity:    Days per week: Not on file    Minutes per session:  Not on file  . Stress: Not on file  Relationships  . Social connections:    Talks on phone: Not on file    Gets together: Not on file    Attends religious service: Not on file    Active member of club or organization: Not on file    Attends meetings of clubs or organizations: Not on file    Relationship status: Not on file  Other Topics Concern  .  Not on file  Social History Narrative   Lives in [place] by herself.  She is active around the house w/o symptoms or limitations.    Family History  Problem Relation Age of Onset  . Arthritis Mother        died in her 37's  . Arthritis Father        died in [place].  Marland Kitchen CAD Brother        deceased  . CAD Brother        deceased  . CAD Sister        deceased    Review of Systems  Constitutional: Negative for chills and fever.  Respiratory: Positive for shortness of breath (with walking a lot ). Negative for cough and wheezing.   Cardiovascular: Positive for palpitations (from afib, occasional) and leg swelling (left leg > rigth leg). Negative for chest pain.  Neurological: Positive for numbness (in feet). Negative for light-headedness and headaches.       Objective:   Vitals:   02/04/18 0958  BP: 124/72  Pulse: (!) 58  Resp: 16  Temp: (!) 97.5 F (36.4 C)  SpO2: 98%   BP Readings from Last 3 Encounters:  02/04/18 124/72  08/06/17 (!) 142/80  05/15/17 136/82   Wt Readings from Last 3 Encounters:  02/04/18 197 lb (89.4 kg)  08/06/17 194 lb (88 kg)  05/15/17 194 lb (88 kg)   Body mass index is 32.78 kg/m.   Physical Exam    Constitutional: Appears well-developed and well-nourished. No distress.  HENT:  Head: Normocephalic and atraumatic.  Neck: Neck supple. No tracheal deviation present. No thyromegaly present.  No cervical lymphadenopathy Cardiovascular: Normal rate, irregular rhythm and normal heart sounds.   No murmur heard. No carotid bruit .  Trace LLE edema Pulmonary/Chest: Effort normal and breath sounds  normal. No respiratory distress. No has no wheezes. No rales.  Skin: Skin is warm and dry. Not diaphoretic.  Psychiatric: Normal mood and affect. Behavior is normal.      Assessment & Plan:    See Problem List for Assessment and Plan of chronic medical problems.

## 2018-02-02 NOTE — Patient Instructions (Addendum)
  Test(s) ordered today. Your results will be released to MyChart (or called to you) after review, usually within 72hours after test completion. If any changes need to be made, you will be notified at that same time.  Medications reviewed and updated.  No changes recommended at this time.    Please followup in 6 months   

## 2018-02-04 ENCOUNTER — Other Ambulatory Visit (INDEPENDENT_AMBULATORY_CARE_PROVIDER_SITE_OTHER): Payer: Medicare Other

## 2018-02-04 ENCOUNTER — Ambulatory Visit (INDEPENDENT_AMBULATORY_CARE_PROVIDER_SITE_OTHER): Payer: Medicare Other | Admitting: Internal Medicine

## 2018-02-04 ENCOUNTER — Encounter: Payer: Self-pay | Admitting: Internal Medicine

## 2018-02-04 VITALS — BP 124/72 | HR 58 | Temp 97.5°F | Resp 16 | Ht 65.0 in | Wt 197.0 lb

## 2018-02-04 DIAGNOSIS — E782 Mixed hyperlipidemia: Secondary | ICD-10-CM | POA: Diagnosis not present

## 2018-02-04 DIAGNOSIS — R200 Anesthesia of skin: Secondary | ICD-10-CM | POA: Diagnosis not present

## 2018-02-04 DIAGNOSIS — E538 Deficiency of other specified B group vitamins: Secondary | ICD-10-CM | POA: Diagnosis not present

## 2018-02-04 DIAGNOSIS — E119 Type 2 diabetes mellitus without complications: Secondary | ICD-10-CM | POA: Diagnosis not present

## 2018-02-04 DIAGNOSIS — I10 Essential (primary) hypertension: Secondary | ICD-10-CM

## 2018-02-04 DIAGNOSIS — I4891 Unspecified atrial fibrillation: Secondary | ICD-10-CM

## 2018-02-04 DIAGNOSIS — R202 Paresthesia of skin: Secondary | ICD-10-CM

## 2018-02-04 DIAGNOSIS — I2510 Atherosclerotic heart disease of native coronary artery without angina pectoris: Secondary | ICD-10-CM

## 2018-02-04 LAB — CBC WITH DIFFERENTIAL/PLATELET
BASOS ABS: 0.1 10*3/uL (ref 0.0–0.1)
BASOS PCT: 0.9 % (ref 0.0–3.0)
EOS ABS: 0.2 10*3/uL (ref 0.0–0.7)
Eosinophils Relative: 2.8 % (ref 0.0–5.0)
HCT: 40.1 % (ref 36.0–46.0)
HEMOGLOBIN: 13.4 g/dL (ref 12.0–15.0)
Lymphocytes Relative: 32.9 % (ref 12.0–46.0)
Lymphs Abs: 2.3 10*3/uL (ref 0.7–4.0)
MCHC: 33.5 g/dL (ref 30.0–36.0)
MCV: 92.2 fl (ref 78.0–100.0)
MONO ABS: 0.6 10*3/uL (ref 0.1–1.0)
Monocytes Relative: 8.7 % (ref 3.0–12.0)
Neutro Abs: 3.9 10*3/uL (ref 1.4–7.7)
Neutrophils Relative %: 54.7 % (ref 43.0–77.0)
Platelets: 186 10*3/uL (ref 150.0–400.0)
RBC: 4.35 Mil/uL (ref 3.87–5.11)
RDW: 13.3 % (ref 11.5–15.5)
WBC: 7.1 10*3/uL (ref 4.0–10.5)

## 2018-02-04 LAB — COMPREHENSIVE METABOLIC PANEL
ALBUMIN: 4.2 g/dL (ref 3.5–5.2)
ALT: 16 U/L (ref 0–35)
AST: 18 U/L (ref 0–37)
Alkaline Phosphatase: 63 U/L (ref 39–117)
BILIRUBIN TOTAL: 0.5 mg/dL (ref 0.2–1.2)
BUN: 17 mg/dL (ref 6–23)
CALCIUM: 9.9 mg/dL (ref 8.4–10.5)
CHLORIDE: 107 meq/L (ref 96–112)
CO2: 28 mEq/L (ref 19–32)
CREATININE: 1.03 mg/dL (ref 0.40–1.20)
GFR: 54.06 mL/min — ABNORMAL LOW (ref >60.00)
Glucose, Bld: 103 mg/dL — ABNORMAL HIGH (ref 70–99)
Potassium: 4.4 mEq/L (ref 3.5–5.1)
SODIUM: 142 meq/L (ref 135–145)
TOTAL PROTEIN: 7.3 g/dL (ref 6.0–8.3)

## 2018-02-04 LAB — LIPID PANEL
CHOLESTEROL: 192 mg/dL (ref 0–200)
HDL: 50.2 mg/dL (ref >39.00)
NonHDL: 141.83
Total CHOL/HDL Ratio: 4
Triglycerides: 201 mg/dL — ABNORMAL HIGH (ref 0.0–149.0)
VLDL: 40.2 mg/dL — AB (ref 0.0–40.0)

## 2018-02-04 LAB — HEMOGLOBIN A1C: HEMOGLOBIN A1C: 6.3 % (ref 4.6–6.5)

## 2018-02-04 LAB — LDL CHOLESTEROL, DIRECT: Direct LDL: 116 mg/dL

## 2018-02-04 LAB — VITAMIN B12: VITAMIN B 12: 654 pg/mL (ref 211–911)

## 2018-02-04 NOTE — Assessment & Plan Note (Signed)
BP well controlled Current regimen effective and well tolerated Continue current medications at current doses cmp  

## 2018-02-04 NOTE — Assessment & Plan Note (Signed)
Taking B12 daily Check B12 level 

## 2018-02-04 NOTE — Assessment & Plan Note (Signed)
Irregular rhythm, has occasional palpitations Rate controlled on metoprolol and Cardizem Taking warfarin Check CBC, CMP

## 2018-02-04 NOTE — Assessment & Plan Note (Signed)
Numbness and tingling plantar surface of feet started approximately year and a half ago Had just been diagnosed with diabetes at that time so less likely to be related to diabetes No pain Some imbalance Since she is not having any pain in her symptoms are tolerable we both agree to hold off on gabapentin Has had low B12 in the past-currently taking B12 and we will agree to recheck her level Sugars have been well controlled Discussed neurology evaluation in the past and again today-she will consider this, but deferred for today

## 2018-02-04 NOTE — Assessment & Plan Note (Signed)
She is not experiencing any chest pain.  She has occasional palpitations from atrial fibrillation.  Some shortness of breath depending on how much she exerts herself No concerning symptoms Check CBC, CMP, lipid panel Has upcoming cardiology appointment Continue current medications

## 2018-02-04 NOTE — Assessment & Plan Note (Signed)
Diet controlled Not exercising secondary to arthritis Continue diabetic diet Check A1c Follow-up in 6 months

## 2018-02-04 NOTE — Assessment & Plan Note (Signed)
Check lipid panel  Continue daily statin Regular exercise and healthy diet encouraged  

## 2018-02-06 NOTE — Progress Notes (Signed)
Patient ID: Judy [name], female   DOB: [birth year], 84 y.o.   MRN: [id number]  84 y.o.  female with prior history of coronary artery disease status post prior stenting of the right coronary artery in 1996 and subsequent stenting of the PDA and OM in 2006. Chronic afib on anticoagulation with rate control She lives by herself in [place] and is quite active around her home without symptoms or limitations. She does have macular degeneration requires monthly injections of the left eye and every other monthly injections into the right eye.  Follows with Dr Tye Savoy for her eye issues  09/2016   1-39% ICA Duplex  No angina compliant with meds Primary follows labs for HLD on statin   Has macular degeneration and not driving except to church   ROS: Denies fever, malais, weight loss, blurry vision, decreased visual acuity, cough, sputum, SOB, hemoptysis, pleuritic pain, palpitaitons, heartburn, abdominal pain, melena, lower extremity edema, claudication, or rash.  All other systems reviewed and negative  General: Affect appropriate Elderly white female  HEENT: normal Neck supple with no adenopathy JVP normal no bruits no thyromegaly Lungs clear with no wheezing and good diaphragmatic motion Heart:  S1/S2 no murmur, no rub, gallop or click PMI normal Abdomen: benighn, BS positve, no tenderness, no AAA no bruit.  No HSM or HJR Distal pulses intact with no bruits No edema Neuro non-focal Skin warm and dry No muscular weakness     Current Outpatient Medications  Medication Sig Dispense Refill  . acetaminophen (TYLENOL) 500 MG tablet Take 500 mg by mouth every 6 (six) hours as needed.    Marland Kitchen aspirin 81 MG tablet Take 81 mg by mouth daily.      Marland Kitchen azelastine (ASTELIN) 0.1 % nasal spray Place 2 sprays into the nose 2 (two) times daily as needed for rhinitis or allergies.    Marland Kitchen b complex vitamins tablet Take 1 tablet by mouth daily.    Marland Kitchen diltiazem (CARDIZEM CD) 120 MG 24 hr capsule TAKE ONE  CAPSULE BY MOUTH NIGHTLY AT BEDTIME 90 capsule 3  . Fexofenadine HCl (ALLEGRA ALLERGY PO) Take 1 tablet by mouth daily.     . furosemide (LASIX) 20 MG tablet Take 1 tablet (20 mg total) by mouth daily. Please keep upcoming appt in September with Dr. Johnsie Cancel for future refills. Thank you 90 tablet 0  . isosorbide mononitrate (IMDUR) 30 MG 24 hr tablet TAKE 1 TABLET (30 MG TOTAL) BY MOUTH AT BEDTIME. 90 tablet 2  . lisinopril (PRINIVIL,ZESTRIL) 10 MG tablet TAKE 1 TABLET BY MOUTH DAILY 90 tablet 2  . meclizine (ANTIVERT) 25 MG tablet Take 1 tablet (25 mg total) by mouth 3 (three) times daily as needed for dizziness. 30 tablet 0  . metoprolol succinate (TOPROL-XL) 100 MG 24 hr tablet TAKE 1 TABLET (100 MG TOTAL) BY MOUTH DAILY. 90 tablet 1  . Multiple Vitamin (MULTIVITAMIN WITH MINERALS) TABS tablet Take 1 tablet by mouth daily.    Marland Kitchen NITROSTAT 0.4 MG SL tablet PLACE 1 TABLET UNDER TONGUE EVERY 5 MINUTES AS NEEDED FOR CHEST PAIN 25 tablet 8  . potassium chloride (MICRO-K) 10 MEQ CR capsule TAKE 1 CAPSULE (10 MEQ TOTAL) BY MOUTH DAILY. 90 capsule 2  . pravastatin (PRAVACHOL) 80 MG tablet TAKE 1 TABLET (80 MG TOTAL) BY MOUTH AT BEDTIME. 90 tablet 0  . tobramycin (TOBREX) 0.3 % ophthalmic solution Place 1 drop into both eyes daily as needed.    . warfarin (COUMADIN) 1 MG tablet TAKE 1  TABLET BY MOUTH DAILY (TAKE IN ADDITION TO 2.5MG ) 30 tablet 0  . warfarin (COUMADIN) 2.5 MG tablet Take 1 tablet daily or as directed by anticoagulation clinic. 90 tablet 1   No current facility-administered medications for this visit.     Allergies  Betadine [povidone iodine] and Methocarbamol  Electrocardiogram:  afib low voltage non specific ST chages  10/14  03/09/14  afib rate 72 ? Old IMI  04/28/15 afib rate 74 Old IMI 06/30/16 afib rate 100 old IMI  02/13/18 afib rate 69 aberrant beats   Assessment and Plan CAD: Stable with no angina and good activity level.  Continue medical Rx Carotid: plaque no stenosis f/u  duplex 09/2018 Afib: chronic good rate control and anticoagulation  Anticoagulation:  No bleeding issues INR Rx gets checked every 6 weeks  Chol::  Labs with primary continue pravachol   F/U in a year   [employer]

## 2018-02-08 ENCOUNTER — Ambulatory Visit (INDEPENDENT_AMBULATORY_CARE_PROVIDER_SITE_OTHER): Payer: Medicare Other | Admitting: General Practice

## 2018-02-08 DIAGNOSIS — Z7901 Long term (current) use of anticoagulants: Secondary | ICD-10-CM

## 2018-02-08 DIAGNOSIS — I4891 Unspecified atrial fibrillation: Secondary | ICD-10-CM

## 2018-02-08 LAB — POCT INR: INR: 2.8 (ref 2.0–3.0)

## 2018-02-08 NOTE — Patient Instructions (Addendum)
Pre visit review using our clinic review tool, if applicable. No additional management support is needed unless otherwise documented below in the visit note.   Take 1 (2.5) mg tablet daily except nothing on Fridays.  Re-check in 4 weeks.

## 2018-02-08 NOTE — Progress Notes (Signed)
Agree with management.  Stacy J Burns, MD  

## 2018-02-13 ENCOUNTER — Encounter: Payer: Self-pay | Admitting: Cardiovascular Disease

## 2018-02-13 ENCOUNTER — Ambulatory Visit (INDEPENDENT_AMBULATORY_CARE_PROVIDER_SITE_OTHER): Payer: Medicare Other | Admitting: Cardiovascular Disease

## 2018-02-13 VITALS — BP 138/78 | HR 80 | Ht 65.0 in | Wt 196.0 lb

## 2018-02-13 DIAGNOSIS — I4819 Other persistent atrial fibrillation: Secondary | ICD-10-CM

## 2018-02-13 DIAGNOSIS — I481 Persistent atrial fibrillation: Secondary | ICD-10-CM

## 2018-02-13 DIAGNOSIS — I6523 Occlusion and stenosis of bilateral carotid arteries: Secondary | ICD-10-CM

## 2018-02-13 NOTE — Patient Instructions (Addendum)
Medication Instructions:  Your physician recommends that you continue on your current medications as directed. Please refer to the Current Medication list given to you today.  Labwork: NONE  Testing/Procedures: Your physician has requested that you have a carotid duplex in April 2020. This test is an ultrasound of the carotid arteries in your neck. It looks at blood flow through these arteries that supply the brain with blood. Allow one hour for this exam. There are no restrictions or special instructions.  Follow-Up: Your physician wants you to follow-up in: 6 months with Dr. Johnsie Cancel. You will receive a reminder letter in the mail two months in advance. If you don't receive a letter, please call our office to schedule the follow-up appointment.   If you need a refill on your cardiac medications before your next appointment, please call your pharmacy.

## 2018-02-19 ENCOUNTER — Other Ambulatory Visit: Payer: Self-pay | Admitting: Cardiovascular Disease

## 2018-02-27 ENCOUNTER — Other Ambulatory Visit: Payer: Self-pay | Admitting: Cardiovascular Disease

## 2018-02-28 ENCOUNTER — Telehealth: Payer: Self-pay | Admitting: Internal Medicine

## 2018-02-28 ENCOUNTER — Other Ambulatory Visit: Payer: Self-pay | Admitting: Internal Medicine

## 2018-02-28 MED ORDER — METOPROLOL SUCCINATE ER 100 MG PO TB24: 100.0000 mg | ORAL_TABLET | Freq: Every day | ORAL | 1 refills | Status: DC

## 2018-02-28 NOTE — Telephone Encounter (Signed)
Copied from [place] [id number]. Topic: Quick Communication - Rx Refill/Question >> Feb 28, 2018  3:45 PM Blase Mess A wrote: Medication: metoprolol succinate (TOPROL-XL) 100 MG 24 hr tablet [[id number]]   Has the patient contacted their pharmacy? Yes  (Agent: If no, request that the patient contact the pharmacy for the refill.) (Agent: If yes, when and what did the pharmacy advise?)  Preferred Pharmacy (with phone number or street name): [facility] [place] [place] [facility] [place] [address] [place] [address] Phone: [phone] Fax: [phone]    Agent: Please be advised that RX refills may take up to 3 business days. We ask that you follow-up with your pharmacy.

## 2018-02-28 NOTE — Telephone Encounter (Signed)
metoprolol refill Last Refill:08/27/17 # 90 1 RF Last OV: 02/04/18 PCP: Dr Quay Burow [facility] [address]

## 2018-03-08 ENCOUNTER — Ambulatory Visit (INDEPENDENT_AMBULATORY_CARE_PROVIDER_SITE_OTHER): Payer: Medicare Other | Admitting: General Practice

## 2018-03-08 ENCOUNTER — Ambulatory Visit: Payer: Medicare Other

## 2018-03-08 DIAGNOSIS — Z23 Encounter for immunization: Secondary | ICD-10-CM | POA: Diagnosis not present

## 2018-03-08 DIAGNOSIS — Z7901 Long term (current) use of anticoagulants: Secondary | ICD-10-CM

## 2018-03-08 DIAGNOSIS — I4891 Unspecified atrial fibrillation: Secondary | ICD-10-CM

## 2018-03-08 LAB — POCT INR: INR: 2.9 (ref 2.0–3.0)

## 2018-03-08 NOTE — Patient Instructions (Signed)
Pre visit review using our clinic review tool, if applicable. No additional management support is needed unless otherwise documented below in the visit note.  Take 1 (2.5) mg tablet daily except nothing on Fridays and 1/2 tablet on Wednesdays. Re-check in 4 weeks.

## 2018-03-08 NOTE — Progress Notes (Signed)
poct inr

## 2018-03-11 ENCOUNTER — Other Ambulatory Visit: Payer: Self-pay | Admitting: Cardiovascular Disease

## 2018-03-26 DIAGNOSIS — H35372 Puckering of macula, left eye: Secondary | ICD-10-CM | POA: Diagnosis not present

## 2018-03-26 DIAGNOSIS — H353231 Exudative age-related macular degeneration, bilateral, with active choroidal neovascularization: Secondary | ICD-10-CM | POA: Diagnosis not present

## 2018-03-26 DIAGNOSIS — H35423 Microcystoid degeneration of retina, bilateral: Secondary | ICD-10-CM | POA: Diagnosis not present

## 2018-03-26 DIAGNOSIS — H43813 Vitreous degeneration, bilateral: Secondary | ICD-10-CM | POA: Diagnosis not present

## 2018-04-05 ENCOUNTER — Ambulatory Visit (INDEPENDENT_AMBULATORY_CARE_PROVIDER_SITE_OTHER): Payer: Medicare Other | Admitting: General Practice

## 2018-04-05 DIAGNOSIS — Z7901 Long term (current) use of anticoagulants: Secondary | ICD-10-CM

## 2018-04-05 DIAGNOSIS — I4891 Unspecified atrial fibrillation: Secondary | ICD-10-CM

## 2018-04-05 LAB — POCT INR: INR: 2.2 (ref 2.0–3.0)

## 2018-04-05 NOTE — Progress Notes (Signed)
Agree with management.  Akiah Bauch J Jerson Furukawa, MD  

## 2018-04-05 NOTE — Patient Instructions (Signed)
Pre visit review using our clinic review tool, if applicable. No additional management support is needed unless otherwise documented below in the visit note.  Take 1 (2.5) mg tablet daily except nothing on Fridays and 1/2 tablet on Wednesdays. Re-check in 4 weeks.

## 2018-04-06 ENCOUNTER — Other Ambulatory Visit: Payer: Self-pay | Admitting: Internal Medicine

## 2018-04-06 DIAGNOSIS — R202 Paresthesia of skin: Principal | ICD-10-CM

## 2018-04-06 DIAGNOSIS — R200 Anesthesia of skin: Secondary | ICD-10-CM

## 2018-04-08 ENCOUNTER — Other Ambulatory Visit: Payer: Self-pay | Admitting: Cardiovascular Disease

## 2018-04-09 ENCOUNTER — Other Ambulatory Visit: Payer: Self-pay | Admitting: Internal Medicine

## 2018-04-09 MED ORDER — MECLIZINE HCL 25 MG PO TABS: 25.0000 mg | ORAL_TABLET | Freq: Three times a day (TID) | ORAL | 0 refills | Status: DC | PRN

## 2018-04-09 NOTE — Telephone Encounter (Signed)
Reviewed chart pt is up-to-date sent refills to [facility].../lmb  

## 2018-04-09 NOTE — Telephone Encounter (Signed)
Requested medication (s) are due for refill today:  yes  Requested medication (s) are on the active medication list:  yes  Future visit scheduled:  yes  Last Refill: 04/11/17; #30; no refills  Requested Prescriptions  Pending Prescriptions Disp Refills   meclizine (ANTIVERT) 25 MG tablet 30 tablet 0    Sig: Take 1 tablet (25 mg total) by mouth 3 (three) times daily as needed for dizziness.     Not Delegated - Gastroenterology: Antiemetics Failed - 04/09/2018  7:59 AM      Failed - This refill cannot be delegated      Passed - Valid encounter within last 6 months    Recent Outpatient Visits          2 months ago Essential hypertension, benign   [place], Claudina Lick, MD   8 months ago Type 2 diabetes mellitus without complication, without long-term current use of insulin ([place])   [place], Claudina Lick, MD   1 year ago Essential hypertension, benign   [place], Claudina Lick, MD   1 year ago Mixed hyperlipidemia   [place], Stacy J, MD   2 years ago Essential hypertension, benign   [place], Claudina Lick, MD      Future Appointments            In 4 months [place], Claudina Lick, MD [place], [facility]

## 2018-04-09 NOTE — Telephone Encounter (Signed)
Copied from [place] [id number]. Topic: Quick Communication - Rx Refill/Question >> Apr 09, 2018  7:39 AM Keene Breath wrote: Medication: meclizine (ANTIVERT) 25 MG tablet  Patient called to request a refill for the above medication.  CB# [phone]  Preferred Pharmacy (with phone number or street name): [facility] [place] [place] [address] [phone] (Phone) [phone] (Fax)

## 2018-04-18 ENCOUNTER — Encounter: Payer: Self-pay | Admitting: Internal Medicine

## 2018-05-02 ENCOUNTER — Other Ambulatory Visit: Payer: Self-pay

## 2018-05-03 ENCOUNTER — Ambulatory Visit (INDEPENDENT_AMBULATORY_CARE_PROVIDER_SITE_OTHER): Payer: Medicare Other | Admitting: General Practice

## 2018-05-03 DIAGNOSIS — I4891 Unspecified atrial fibrillation: Secondary | ICD-10-CM

## 2018-05-03 DIAGNOSIS — Z7901 Long term (current) use of anticoagulants: Secondary | ICD-10-CM

## 2018-05-03 LAB — POCT INR: INR: 2 (ref 2.0–3.0)

## 2018-05-03 NOTE — Patient Instructions (Addendum)
Pre visit review using our clinic review tool, if applicable. No additional management support is needed unless otherwise documented below in the visit note.  Take 1/2 tablet daily and then continue to take 1 tablet daily except nothing on Fridays and 1/2 tablet on Wednesdays. Re-check in 4 weeks.

## 2018-05-04 NOTE — Progress Notes (Signed)
Agree with management.  Lynna Zamorano J Kijuana Ruppel, MD  

## 2018-05-21 ENCOUNTER — Encounter: Payer: Self-pay | Admitting: Internal Medicine

## 2018-05-21 ENCOUNTER — Other Ambulatory Visit: Payer: Self-pay | Admitting: Cardiovascular Disease

## 2018-05-21 ENCOUNTER — Ambulatory Visit (INDEPENDENT_AMBULATORY_CARE_PROVIDER_SITE_OTHER): Payer: Medicare Other | Admitting: Internal Medicine

## 2018-05-21 DIAGNOSIS — I6523 Occlusion and stenosis of bilateral carotid arteries: Secondary | ICD-10-CM | POA: Diagnosis not present

## 2018-05-21 DIAGNOSIS — B9789 Other viral agents as the cause of diseases classified elsewhere: Secondary | ICD-10-CM | POA: Diagnosis not present

## 2018-05-21 DIAGNOSIS — J069 Acute upper respiratory infection, unspecified: Secondary | ICD-10-CM | POA: Insufficient documentation

## 2018-05-21 MED ORDER — METHYLPREDNISOLONE ACETATE 40 MG/ML IJ SUSP
40.0000 mg | Freq: Once | INTRAMUSCULAR | Status: AC
  Administered 2018-05-21: 40 mg via INTRAMUSCULAR

## 2018-05-21 NOTE — Patient Instructions (Signed)
Keep taking the allegra and you can use saline spray or neti pot to help.   It is okay to take mucinex without the cold medicine. Ask the pharmacist if you need help.

## 2018-05-21 NOTE — Progress Notes (Signed)
   Subjective:    Patient ID: Judy [name], female    DOB: September 28, 1930, 84 y.o.   MRN: [id number]  HPI The patient is an 84 YO female coming in for sore throat and loss of voice. Starting to improve overall. She denies fevers or chills. Denies SOB. Cough is non-productive. She is in the heat at home and is not sure if this affected her. She was sneezing a lot on Sunday. Has not tried anything for it but does take allegra daily.   Review of Systems  Constitutional: Negative for activity change, appetite change, chills, fatigue, fever and unexpected weight change.  HENT: Positive for congestion, postnasal drip, rhinorrhea, sinus pressure and sneezing. Negative for ear discharge, ear pain, sinus pain, sore throat, tinnitus, trouble swallowing and voice change.   Eyes: Negative.   Respiratory: Positive for cough. Negative for chest tightness, shortness of breath and wheezing.   Cardiovascular: Negative.   Gastrointestinal: Negative.   Musculoskeletal: Positive for myalgias.  Neurological: Negative.       Objective:   Physical Exam  Constitutional: She is oriented to person, place, and time. She appears well-developed and well-nourished.  HENT:  Head: Normocephalic and atraumatic.  Oropharynx with redness and clear drainage, nose with swollen turbinates, TMs normal bilaterally  Eyes: EOM are normal.  Neck: Normal range of motion. No thyromegaly present.  Cardiovascular: Normal rate and regular rhythm.  Pulmonary/Chest: Effort normal and breath sounds normal. No respiratory distress. She has no wheezes. She has no rales.  Abdominal: Soft.  Musculoskeletal: She exhibits tenderness.  Lymphadenopathy:    She has no cervical adenopathy.  Neurological: She is alert and oriented to person, place, and time.  Skin: Skin is warm and dry.   Vitals:   05/21/18 1347  BP: 130/74  Pulse: 83  Temp: 98.6 F (37 C)  TempSrc: Oral  SpO2: 98%  Weight: 195 lb (88.5 kg)  Height: 5\' 5"  (1.651 m)       Assessment & Plan:  Depo-medrol 40 mg IM

## 2018-05-21 NOTE — Assessment & Plan Note (Signed)
Given depo-medrol 40 mg IM and no indication for antibiotics. Can continue allegra and do sinus rinses. Talked to her about typical course and conservative measures.

## 2018-05-31 ENCOUNTER — Ambulatory Visit (INDEPENDENT_AMBULATORY_CARE_PROVIDER_SITE_OTHER): Payer: Medicare Other | Admitting: General Practice

## 2018-05-31 DIAGNOSIS — Z7901 Long term (current) use of anticoagulants: Secondary | ICD-10-CM

## 2018-05-31 DIAGNOSIS — I4891 Unspecified atrial fibrillation: Secondary | ICD-10-CM

## 2018-05-31 LAB — POCT INR: INR: 2 (ref 2.0–3.0)

## 2018-05-31 NOTE — Patient Instructions (Addendum)
Pre visit review using our clinic review tool, if applicable. No additional management support is needed unless otherwise documented below in the visit note.  Take 1/2 tablet tonight and then take 1 tablet daily except nothing on Fridays.  Re-check in 4 weeks.

## 2018-06-01 NOTE — Progress Notes (Signed)
Agree with management.  Judy [name] Judy Sophiagrace [place], MD  

## 2018-06-13 ENCOUNTER — Ambulatory Visit (INDEPENDENT_AMBULATORY_CARE_PROVIDER_SITE_OTHER): Payer: Medicare Other | Admitting: Family Medicine

## 2018-06-13 ENCOUNTER — Ambulatory Visit: Payer: Self-pay

## 2018-06-13 ENCOUNTER — Encounter: Payer: Self-pay | Admitting: Family Medicine

## 2018-06-13 VITALS — BP 140/78 | HR 84 | Temp 98.0°F | Resp 16 | Wt 190.0 lb

## 2018-06-13 DIAGNOSIS — J0100 Acute maxillary sinusitis, unspecified: Secondary | ICD-10-CM

## 2018-06-13 MED ORDER — DOXYCYCLINE HYCLATE 100 MG PO TABS: 100.0000 mg | ORAL_TABLET | Freq: Two times a day (BID) | ORAL | 0 refills | Status: DC

## 2018-06-13 NOTE — Progress Notes (Signed)
Patient ID: Judy [name], female   DOB: 1930/11/03, 85 y.o.   MRN: [id number]  PCP: Binnie Rail, MD  Subjective:  Judy [name] is a 85 y.o. year old very pleasant female patient who presents with symptoms including nasal congestion, cough that is mildly productive with either clear or white sputum -started: 3 weeks ago , symptoms are not improving  -previous treatments: Mucinex and allegra have provided limited benefit -sick contacts/travel/risks: denies flu exposure. Recent sick contact exposure with family and grandchildren. -Hx of: allergies and atrial fibrillation  ROS-denies fever, SOB, NVD, tooth pain  Pertinent Past Medical History- HTN, Diabetes  Medications- reviewed  Current Outpatient Medications  Medication Sig Dispense Refill  . acetaminophen (TYLENOL) 500 MG tablet Take 500 mg by mouth every 6 (six) hours as needed.    Marland Kitchen aspirin 81 MG tablet Take 81 mg by mouth daily.      Marland Kitchen azelastine (ASTELIN) 0.1 % nasal spray Place 2 sprays into the nose 2 (two) times daily as needed for rhinitis or allergies.    Marland Kitchen b complex vitamins tablet Take 1 tablet by mouth daily.    Marland Kitchen diltiazem (CARDIZEM CD) 120 MG 24 hr capsule TAKE ONE CAPSULE BY MOUTH NIGHTLY AT BEDTIME 90 capsule 3  . Fexofenadine HCl (ALLEGRA ALLERGY PO) Take 1 tablet by mouth daily.     . furosemide (LASIX) 20 MG tablet TAKE ONE TABLET BY MOUTH DAILY 90 tablet 2  . isosorbide mononitrate (IMDUR) 30 MG 24 hr tablet TAKE 1 TABLET (30 MG TOTAL) BY MOUTH AT BEDTIME. 90 tablet 2  . lisinopril (PRINIVIL,ZESTRIL) 10 MG tablet TAKE 1 TABLET BY MOUTH DAILY 90 tablet 2  . meclizine (ANTIVERT) 25 MG tablet Take 1 tablet (25 mg total) by mouth 3 (three) times daily as needed for dizziness. 30 tablet 0  . metoprolol succinate (TOPROL-XL) 100 MG 24 hr tablet Take 1 tablet (100 mg total) by mouth daily. 90 tablet 1  . Multiple Vitamin (MULTIVITAMIN WITH MINERALS) TABS tablet Take 1 tablet by mouth daily.    Marland Kitchen NITROSTAT 0.4 MG SL  tablet PLACE 1 TABLET UNDER TONGUE EVERY 5 MINUTES AS NEEDED FOR CHEST PAIN 25 tablet 8  . potassium chloride (MICRO-K) 10 MEQ CR capsule TAKE 1 CAPSULE (10 MEQ TOTAL) BY MOUTH DAILY. 90 capsule 3  . simvastatin (ZOCOR) 40 MG tablet Please specify directions, refills and quantity 90 tablet 0  . tobramycin (TOBREX) 0.3 % ophthalmic solution Place 1 drop into both eyes daily as needed.    . warfarin (COUMADIN) 1 MG tablet TAKE 1 TABLET BY MOUTH DAILY (TAKE IN ADDITION TO 2.5MG ) 30 tablet 0  . warfarin (COUMADIN) 2.5 MG tablet Take 1 tablet daily or as directed by anticoagulation clinic. 90 tablet 1   No current facility-administered medications for this visit.     Objective: BP 140/78   Pulse 84   Temp 98 F (36.7 C) (Oral)   Resp 16   Wt 190 lb (86.2 kg)   SpO2 98%   BMI 31.62 kg/m  Gen: NAD, resting comfortably HEENT: Turbinates mildly erythematous, TMs normal bilaterally, oropharynx is clear and moist, maxillary sinus pain present with L > R. CV: Normal rate and regular rhythm Lungs: CTAB no crackles, wheeze, rhonchi Abdomen: soft/nontender/nondistended/normal bowel sounds. No rebound or guarding.  Ext: no edema Skin: warm, dry, no rash Neuro: grossly normal, moves all extremities  Assessment/Plan: 1. Acute maxillary sinusitis, recurrence not specified Duration of symptoms and sinus pain pressure today; will  treat with doxycycline BID. Opted for doxycycline vs Augmentin due to slightly decreased kidney function and tolerability by patient. Advised patient to follow up with her INR as antibiotics and warfarin have interactions. Also sent a note to Villa Herb, RN regarding this medication and follow up for INR check. Jenny Reichmann responded that she will contact patient for follow up INR. Advised continued use of Mucinex and she will continue Zyrtec for treatment of allergic rhinitis that is likely also contributing to symptoms.  Advised patient on supportive measures and follow up if fever  >101, if symptoms worsen or if symptoms are not improving in 3 to 4  days. Patient verbalizes understanding.   - doxycycline (VIBRA-TABS) 100 MG tablet; Take 1 tablet (100 mg total) by mouth 2 (two) times daily.  Dispense: 20 tablet; Refill: 0     Finally, we reviewed reasons to return to care including if symptoms worsen or persist or new concerns arise- once again particularly shortness of breath or fever.   Laurita Quint, FNP

## 2018-06-13 NOTE — Patient Instructions (Signed)
Please take medication with food as directed and follow up with Jenny Reichmann to have your blood work checked related to your warfarin.  Nasal saline rinses and Mucinex can be continued.   If no improvement or worsening symptoms, follow up with Dr. Quay Burow for further evaluation and treatment.   Feel better soon!   Sinusitis, Adult Sinusitis is soreness and swelling (inflammation) of your sinuses. Sinuses are hollow spaces in the bones around your face. They are located:  Around your eyes.  In the middle of your forehead.  Behind your nose.  In your cheekbones. Your sinuses and nasal passages are lined with a fluid called mucus. Mucus drains out of your sinuses. Swelling can trap mucus in your sinuses. This lets germs (bacteria, virus, or fungus) grow, which leads to infection. Most of the time, this condition is caused by a virus. What are the causes? This condition is caused by:  Allergies.  Asthma.  Germs.  Things that block your nose or sinuses.  Growths in the nose (nasal polyps).  Chemicals or irritants in the air.  Fungus (rare). What increases the risk? You are more likely to develop this condition if:  You have a weak body defense system (immune system).  You do a lot of swimming or diving.  You use nasal sprays too much.  You smoke. What are the signs or symptoms? The main symptoms of this condition are pain and a feeling of pressure around the sinuses. Other symptoms include:  Stuffy nose (congestion).  Runny nose (drainage).  Swelling and warmth in the sinuses.  Headache.  Toothache.  A cough that may get worse at night.  Mucus that collects in the throat or the back of the nose (postnasal drip).  Being unable to smell and taste.  Being very tired (fatigue).  A fever.  Sore throat.  Bad breath. How is this diagnosed? This condition is diagnosed based on:  Your symptoms.  Your medical history.  A physical exam.  Tests to find out if  your condition is short-term (acute) or long-term (chronic). Your doctor may: ? Check your nose for growths (polyps). ? Check your sinuses using a tool that has a light (endoscope). ? Check for allergies or germs. ? Do imaging tests, such as an MRI or CT scan. How is this treated? Treatment for this condition depends on the cause and whether it is short-term or long-term.  If caused by a virus, your symptoms should go away on their own within 10 days. You may be given medicines to relieve symptoms. They include: ? Medicines that shrink swollen tissue in the nose. ? Medicines that treat allergies (antihistamines). ? A spray that treats swelling of the nostrils. ? Rinses that help get rid of thick mucus in your nose (nasal saline washes).  If caused by bacteria, your doctor may wait to see if you will get better without treatment. You may be given antibiotic medicine if you have: ? A very bad infection. ? A weak body defense system.  If caused by growths in the nose, you may need to have surgery. Follow these instructions at home: Medicines  Take, use, or apply over-the-counter and prescription medicines only as told by your doctor. These may include nasal sprays.  If you were prescribed an antibiotic medicine, take it as told by your doctor. Do not stop taking the antibiotic even if you start to feel better. Hydrate and humidify   Drink enough water to keep your pee (urine) pale yellow.  Use a cool mist humidifier to keep the humidity level in your home above 50%.  Breathe in steam for 10-15 minutes, 3-4 times a day, or as told by your doctor. You can do this in the bathroom while a hot shower is running.  Try not to spend time in cool or dry air. Rest  Rest as much as you can.  Sleep with your head raised (elevated).  Make sure you get enough sleep each night. General instructions   Put a warm, moist washcloth on your face 3-4 times a day, or as often as told by your  doctor. This will help with discomfort.  Wash your hands often with soap and water. If there is no soap and water, use hand sanitizer.  Do not smoke. Avoid being around people who are smoking (secondhand smoke).  Keep all follow-up visits as told by your doctor. This is important. Contact a doctor if:  You have a fever.  Your symptoms get worse.  Your symptoms do not get better within 10 days. Get help right away if:  You have a very bad headache.  You cannot stop throwing up (vomiting).  You have very bad pain or swelling around your face or eyes.  You have trouble seeing.  You feel confused.  Your neck is stiff.  You have trouble breathing. Summary  Sinusitis is swelling of your sinuses. Sinuses are hollow spaces in the bones around your face.  This condition is caused by tissues in your nose that become inflamed or swollen. This traps germs. These can lead to infection.  If you were prescribed an antibiotic medicine, take it as told by your doctor. Do not stop taking it even if you start to feel better.  Keep all follow-up visits as told by your doctor. This is important. This information is not intended to replace advice given to you by your health care provider. Make sure you discuss any questions you have with your health care provider. Document Released: 11/15/2007 Document Revised: 10/29/2017 Document Reviewed: 10/29/2017 Elsevier Interactive Patient Education  2019 [employer].

## 2018-06-13 NOTE — Telephone Encounter (Signed)
Pt called to state she has been having sinus pressure and pain since the week after Thanksgiving.  She was in to the office and has been treating her symptoms as instructed.  She takes mucinex, and uses nasal saline spray. She is taking Allegra.  She states that her nose is runny and that in the mornings she has pain on the left side of her face under her eyes and down the side of her nose.  She states that her eye will have dry drainage every AM.  She has pressure in her left ear. She has felt chilled but has not checked her temperature. Appointment scheduled per protocol. Care advice read to patient. Pt verbalized understanding.   Reason for Disposition . Earache  Answer Assessment - Initial Assessment Questions 1. LOCATION: "Where does it hurt?"      Left side under eye with drainage 2. ONSET: "When did the sinus pain start?"  (e.g., hours, days)      Week after thanksgiving 3. SEVERITY: "How bad is the pain?"   (Scale 1-10; mild, moderate or severe)   - MILD (1-3): doesn't interfere with normal activities    - MODERATE (4-7): interferes with normal activities (e.g., work or school) or awakens from sleep   - SEVERE (8-10): excruciating pain and patient unable to do any normal activities        mild 4. RECURRENT SYMPTOM: "Have you ever had sinus problems before?" If so, ask: "When was the last time?" and "What happened that time?"      yes 5. NASAL CONGESTION: "Is the nose blocked?" If so, ask, "Can you open it or must you breathe through the mouth?"     runny 6. NASAL DISCHARGE: "Do you have discharge from your nose?" If so ask, "What color?"     A little color yellow 7. FEVER: "Do you have a fever?" If so, ask: "What is it, how was it measured, and when did it start?"      No but chilled sometime 8. OTHER SYMPTOMS: "Do you have any other symptoms?" (e.g., sore throat, cough, earache, difficulty breathing)     earache 9. PREGNANCY: "Is there any chance you are pregnant?" "When was your  last menstrual period?"     N/A  Protocols used: SINUS PAIN OR CONGESTION-A-AH

## 2018-06-18 ENCOUNTER — Ambulatory Visit (INDEPENDENT_AMBULATORY_CARE_PROVIDER_SITE_OTHER): Payer: Medicare Other | Admitting: General Practice

## 2018-06-18 DIAGNOSIS — I4891 Unspecified atrial fibrillation: Secondary | ICD-10-CM

## 2018-06-18 DIAGNOSIS — Z7901 Long term (current) use of anticoagulants: Secondary | ICD-10-CM | POA: Diagnosis not present

## 2018-06-18 DIAGNOSIS — H353231 Exudative age-related macular degeneration, bilateral, with active choroidal neovascularization: Secondary | ICD-10-CM | POA: Diagnosis not present

## 2018-06-18 DIAGNOSIS — H43393 Other vitreous opacities, bilateral: Secondary | ICD-10-CM | POA: Diagnosis not present

## 2018-06-18 DIAGNOSIS — H43813 Vitreous degeneration, bilateral: Secondary | ICD-10-CM | POA: Diagnosis not present

## 2018-06-18 DIAGNOSIS — H35423 Microcystoid degeneration of retina, bilateral: Secondary | ICD-10-CM | POA: Diagnosis not present

## 2018-06-18 LAB — POCT INR: INR: 2.1 (ref 2.0–3.0)

## 2018-06-18 NOTE — Patient Instructions (Addendum)
Pre visit review using our clinic review tool, if applicable. No additional management support is needed unless otherwise documented below in the visit note.  Continue to take 1 tablet daily except nothing on Fridays.  Re-check in 4 weeks.

## 2018-06-19 NOTE — Progress Notes (Signed)
Agree with management.  Aspyn Warnke J Christian Treadway, MD  

## 2018-06-26 ENCOUNTER — Ambulatory Visit (INDEPENDENT_AMBULATORY_CARE_PROVIDER_SITE_OTHER): Payer: Medicare Other | Admitting: Diagnostic Neuroimaging

## 2018-06-26 ENCOUNTER — Encounter: Payer: Self-pay | Admitting: Diagnostic Neuroimaging

## 2018-06-26 VITALS — BP 153/90 | HR 91 | Ht 65.0 in | Wt 190.0 lb

## 2018-06-26 DIAGNOSIS — R200 Anesthesia of skin: Secondary | ICD-10-CM

## 2018-06-26 NOTE — Patient Instructions (Signed)
NUMBNESS IN FEET (likely mild peripheral neuropathy; could be age related; also could be related to mild borderline diabetes) - monitor for now; may consider EMG/NCS and labs if significantly worsens - caution with balance; consider PT and cane if needed  RIGHT CEREBELLAR STROKE - continue coumadin, BP control, statin

## 2018-06-26 NOTE — Progress Notes (Signed)
[facility]  PATIENT: Judy [name] DOB: 1930/10/23  REFERRING CLINICIAN: S Burns HISTORY FROM: patient and daughter  REASON FOR VISIT: new consult    HISTORICAL  CHIEF COMPLAINT:  Chief Complaint  Patient presents with  . New Patient (Initial Visit)    referred by Billey Gosling, MD  . Numbness/ tingling feet    Rm 6, daughter, [name].   L foot more then R, worse at night.     HISTORY OF PRESENT ILLNESS:   85 year old female here for evaluation of lower extremity numbness and tingling.  2 to 3 years ago patient noticed that she was having cold sensation in the feet, especially when she would take off her socks and try to go to sleep.  She has been using heating pad to warm of her feet at nighttime before she goes to sleep.  No cyanosis or discoloration of feet noted.  In the past 6 to 12 months patient has onset of mild numbness and tingling in the bottom of her feet, in the center, lasting minutes or seconds at a time.  No associated pain.  Symptoms more affecting the left and the right foot.  Daughter is also noted some increasing balance and gait difficulty, possible shuffling steps and limping gait over the past 6 to 12 months.  Significant arthritis in her right knee which limits her mobility a little bit.  No significant low back pain.  No neck pain or arm pain.  30 years ago patient had some injury to her left foot when her grandson stepped on it damaged the fifth toe.  She continues to have some numbness and weakness in that region since that time.    REVIEW OF SYSTEMS: Full 14 system review of systems performed and negative with exception of: Palpitations swelling legs macular degeneration shortness of breath easy bruising feeling cold numbness weakness decreased energy joint pain aching muscles.   ALLERGIES: Allergies  Allergen Reactions  . Betadine [Povidone Iodine] Other (See Comments)    "burns eyes"  . Methocarbamol     dizziness     HOME MEDICATIONS: Outpatient Medications Prior to Visit  Medication Sig Dispense Refill  . acetaminophen (TYLENOL) 500 MG tablet Take 500 mg by mouth every 6 (six) hours as needed.    Marland Kitchen aspirin 81 MG tablet Take 81 mg by mouth daily.      Marland Kitchen azelastine (ASTELIN) 0.1 % nasal spray Place 2 sprays into the nose 2 (two) times daily as needed for rhinitis or allergies.    Marland Kitchen b complex vitamins tablet Take 1 tablet by mouth daily.    Marland Kitchen diltiazem (CARDIZEM CD) 120 MG 24 hr capsule TAKE ONE CAPSULE BY MOUTH NIGHTLY AT BEDTIME 90 capsule 3  . Fexofenadine HCl (ALLEGRA ALLERGY PO) Take 1 tablet by mouth daily.     . furosemide (LASIX) 20 MG tablet TAKE ONE TABLET BY MOUTH DAILY 90 tablet 2  . isosorbide mononitrate (IMDUR) 30 MG 24 hr tablet TAKE 1 TABLET (30 MG TOTAL) BY MOUTH AT BEDTIME. 90 tablet 2  . lisinopril (PRINIVIL,ZESTRIL) 10 MG tablet TAKE 1 TABLET BY MOUTH DAILY 90 tablet 2  . meclizine (ANTIVERT) 25 MG tablet Take 1 tablet (25 mg total) by mouth 3 (three) times daily as needed for dizziness. 30 tablet 0  . metoprolol succinate (TOPROL-XL) 100 MG 24 hr tablet Take 1 tablet (100 mg total) by mouth daily. 90 tablet 1  . Multiple Vitamin (MULTIVITAMIN WITH MINERALS) TABS tablet Take 1 tablet  by mouth daily.    Marland Kitchen NITROSTAT 0.4 MG SL tablet PLACE 1 TABLET UNDER TONGUE EVERY 5 MINUTES AS NEEDED FOR CHEST PAIN 25 tablet 8  . potassium chloride (MICRO-K) 10 MEQ CR capsule TAKE 1 CAPSULE (10 MEQ TOTAL) BY MOUTH DAILY. 90 capsule 3  . simvastatin (ZOCOR) 40 MG tablet Please specify directions, refills and quantity 90 tablet 0  . tobramycin (TOBREX) 0.3 % ophthalmic solution Place 1 drop into both eyes daily as needed.    . warfarin (COUMADIN) 1 MG tablet TAKE 1 TABLET BY MOUTH DAILY (TAKE IN ADDITION TO 2.5MG ) 30 tablet 0  . warfarin (COUMADIN) 2.5 MG tablet Take 1 tablet daily or as directed by anticoagulation clinic. 90 tablet 1  . doxycycline (VIBRA-TABS) 100 MG tablet Take 1 tablet (100 mg  total) by mouth 2 (two) times daily. (Patient not taking: Reported on 06/26/2018) 20 tablet 0   No facility-administered medications prior to visit.     PAST MEDICAL HISTORY: Past Medical History:  Diagnosis Date  . Atrial fibrillation ([place])    a. chronic anticoag, failed prior DCCV;  b. 03/2010 Echo: EF 55-65%, mod MR, mildly to mod dil LA, mod dil RA, mild to mod MR.  Marland Kitchen CAD (coronary artery disease)    a. BMS to RCA 96';  b. Stent PDA and OM DES 2006  . CAROTID ARTERY DISEASE    a. 11/2011 Carotid U/S:  0-39% bilat dzs.  Marland Kitchen HTN (hypertension)   . Hyperlipidemia   . LBBB (left bundle branch block)   . Macular degeneration, wet ([place]) 10/2012 dx   L>R, follows with retinal specialist->injections monthly.  . Obesity   . Vertigo     PAST SURGICAL HISTORY: Past Surgical History:  Procedure Laterality Date  . [place], 2006 x2   Drug-eluting stent placement to the PDA, drug-eluting stent  placement to the first obtuse  marginal, StarClose closure of the right common femoral arteriotomy site. Successful drug-eluting stent  placement in  both the posterior descending artery and the obtuse marginal. The lesion was directly stented using a 2.5 x 16 mm Taxus deployed at 14 atmospheres.   Marland Kitchen EYE SURGERY     Tear duct stent    FAMILY HISTORY: Family History  Problem Relation Age of Onset  . Arthritis Mother        died in her 65's  . Arthritis Father        died in [place].  Marland Kitchen CAD Brother        deceased  . CAD Brother        deceased  . CAD Sister        deceased    SOCIAL HISTORY: Social History   Socioeconomic History  . Marital status: Widowed    Spouse name: Not on file  . Number of children: Not on file  . Years of education: Not on file  . Highest education level: Not on file  Occupational History  . Not on file  Social Needs  . Financial resource strain: Not on file  . Food insecurity:    Worry: Not on file    Inability: Not on file  .  Transportation needs:    Medical: Not on file    Non-medical: Not on file  Tobacco Use  . Smoking status: Never Smoker  . Smokeless tobacco: Never Used  Substance and Sexual Activity  . Alcohol use: No  . Drug use: No  . Sexual activity: Not on file  Lifestyle  . Physical activity:    Days per week: Not on file    Minutes per session: Not on file  . Stress: Not on file  Relationships  . Social connections:    Talks on phone: Not on file    Gets together: Not on file    Attends religious service: Not on file    Active member of club or organization: Not on file    Attends meetings of clubs or organizations: Not on file    Relationship status: Not on file  . Intimate partner violence:    Fear of current or ex partner: Not on file    Emotionally abused: Not on file    Physically abused: Not on file    Forced sexual activity: Not on file  Other Topics Concern  . Not on file  Social History Narrative   Lives in [place] by herself.  She is active around the house w/o symptoms or limitations.     PHYSICAL EXAM  GENERAL EXAM/CONSTITUTIONAL: Vitals:  Vitals:   06/26/18 1010  BP: (!) 153/90  Pulse: 91  Weight: 190 lb (86.2 kg)  Height: 5\' 5"  (1.651 m)     Body mass index is 31.62 kg/m. Wt Readings from Last 3 Encounters:  06/26/18 190 lb (86.2 kg)  06/13/18 190 lb (86.2 kg)  05/21/18 195 lb (88.5 kg)     Patient is in no distress; well developed, nourished and groomed; neck is supple  CARDIOVASCULAR:  Examination of carotid arteries is normal; no carotid bruits  Regular rate and rhythm, no murmurs  Examination of peripheral vascular system by observation and palpation is normal  EYES:  Ophthalmoscopic exam of optic discs and posterior segments is normal; no papilledema or hemorrhages  No exam data present  MUSCULOSKELETAL:  Gait, strength, tone, movements noted in Neurologic exam below  NEUROLOGIC: MENTAL STATUS:  MMSE - Mini Mental State Exam  05/15/2017  Orientation to time 5  Orientation to Place 5  Registration 3  Attention/ Calculation 5  Recall 2  Language- name 2 objects 2  Language- repeat 1  Language- follow 3 step command 3  Language- read & follow direction 1  Write a sentence 1  Copy design 1  Total score 29    awake, alert, oriented to person, place and time  recent and remote memory intact  normal attention and concentration  language fluent, comprehension intact, naming intact  fund of knowledge appropriate  CRANIAL NERVE:   2nd - no papilledema on fundoscopic exam  2nd, 3rd, 4th, 6th - pupils equal and reactive to light, visual fields full to confrontation, extraocular muscles intact, no nystagmus  5th - facial sensation symmetric  7th - facial strength symmetric  8th - hearing intact  9th - palate elevates symmetrically, uvula midline  11th - shoulder shrug symmetric  12th - tongue protrusion midline  MOTOR:   normal bulk and tone, full strength in the BUE, BLE  SENSORY:   normal and symmetric to light touch, temperature  SLIGHTLY DECR VIB AT TOES (< 6 SEC)  DECR PP IN BOTTOM OF FEET  COORDINATION:   finger-nose-finger, fine finger movements normal  REFLEXES:   deep tendon reflexes --> BUE 1, KNEES TRACE; ANKLES 0  GAIT/STATION:   LIMPING GAIT DUE TO RIGHT KNEE PAIN; SLOW AND CAREFUL     DIAGNOSTIC DATA (LABS, IMAGING, TESTING) - I reviewed patient records, labs, notes, testing and imaging myself where available.  Lab Results  Component Value Date  WBC 7.1 02/04/2018   HGB 13.4 02/04/2018   HCT 40.1 02/04/2018   MCV 92.2 02/04/2018   PLT 186.0 02/04/2018      Component Value Date/Time   NA 142 02/04/2018 1104   K 4.4 02/04/2018 1104   CL 107 02/04/2018 1104   CO2 28 02/04/2018 1104   GLUCOSE 103 (H) 02/04/2018 1104   BUN 17 02/04/2018 1104   CREATININE 1.03 02/04/2018 1104   CALCIUM 9.9 02/04/2018 1104   PROT 7.3 02/04/2018 1104   ALBUMIN 4.2  02/04/2018 1104   AST 18 02/04/2018 1104   ALT 16 02/04/2018 1104   ALKPHOS 63 02/04/2018 1104   BILITOT 0.5 02/04/2018 1104   GFRNONAA 53 (L) 11/27/2016 0653   GFRAA >60 11/27/2016 0653   Lab Results  Component Value Date   CHOL 192 02/04/2018   HDL 50.20 02/04/2018   LDLCALC 100 (H) 09/22/2015   LDLDIRECT 116.0 02/04/2018   TRIG 201.0 (H) 02/04/2018   CHOLHDL 4 02/04/2018   Lab Results  Component Value Date   HGBA1C 6.3 02/04/2018   Lab Results  Component Value Date   JQGBEEFE07 121 02/04/2018   Lab Results  Component Value Date   TSH 1.81 09/22/2015    11/27/16 MRI brain / MRA head [I reviewed images myself and agree with interpretation. -VRP]  1. No acute intracranial abnormality. 2. Chronic right cerebellar infarct. 3. 45% proximal left ICA stenosis. 4. Patent vertebral arteries without significant stenosis.     ASSESSMENT AND PLAN  85 y.o. year old female here with numbness and tingling in feet since 2019, with signs and symptoms consistent with mild distal peripheral neuropathy.  Patient has borderline diabetes, controlled with diet and exercise.  Patient also has vascular risk factors on medical therapy.  Dx:  1. Numbness in feet     PLAN:  NUMBNESS IN FEET (likely mild peripheral neuropathy; could be idiopathic age related; also could be related to mild borderline diabetes) - monitor for now; may consider checking EMG/NCS and labs if significantly worsens - caution with balance; consider PT and cane if needed  RIGHT CEREBELLAR STROKE (remote) - continue coumadin, BP control, statin  Return pending symptoms; if symptoms worsen or fail to improve.    Penni Bombard, MD 9/75/8832, 54:98 AM Certified in Neurology, Neurophysiology and Neuroimaging  [facility] [address] [place], [place] [address] [phone]

## 2018-06-28 ENCOUNTER — Ambulatory Visit: Payer: Medicare Other

## 2018-07-19 ENCOUNTER — Ambulatory Visit (INDEPENDENT_AMBULATORY_CARE_PROVIDER_SITE_OTHER): Payer: Medicare Other | Admitting: General Practice

## 2018-07-19 DIAGNOSIS — I4891 Unspecified atrial fibrillation: Secondary | ICD-10-CM

## 2018-07-19 DIAGNOSIS — Z7901 Long term (current) use of anticoagulants: Secondary | ICD-10-CM | POA: Diagnosis not present

## 2018-07-19 LAB — POCT INR: INR: 2.6 (ref 2.0–3.0)

## 2018-07-19 NOTE — Progress Notes (Signed)
Agree with management.  Judy [name] J [name], MD  

## 2018-07-19 NOTE — Patient Instructions (Addendum)
Pre visit review using our clinic review tool, if applicable. No additional management support is needed unless otherwise documented below in the visit note.   Continue to take 1 tablet daily except nothing on Fridays.  Re-check in 6 weeks.  

## 2018-08-11 DIAGNOSIS — Z8673 Personal history of transient ischemic attack (TIA), and cerebral infarction without residual deficits: Secondary | ICD-10-CM | POA: Insufficient documentation

## 2018-08-11 NOTE — Assessment & Plan Note (Addendum)
On warfarin, statin BP and sugars well controlled

## 2018-08-11 NOTE — Progress Notes (Signed)
Subjective:    Patient ID: Judy [name], female    DOB: [birth year], 85 y.o.   MRN: [id number]  HPI The patient is here for follow up.  She is here today with her daughter.  Left ankle, lower leg swelling:  It started a couple of months ago.  It is mild, but she does not have any swelling in the right ankle and she was concerned why only the left leg was swollen.  It does get worse as the day progresses and resolves overnight.  It is not uncomfortable and does not bother her that much.  She did have surgery on that leg.  She does have neuropathy and it is worse in the left leg and foot.  Diabetes: She is controlling her sugars with diet. She is compliant with a diabetic diet. She is not exercising regularly. She checks her feet daily and denies foot lesions. She is up-to-date with an ophthalmology examination.   Hyperlipidemia: She is taking her medication daily. She is compliant with a low fat/cholesterol diet. She is not exercising regularly. She denies myalgias.   CAD, Afib, Hypertension: She is taking her medication daily. She is compliant with a low sodium diet.  She denies chest pain, palpitations, edema, shortness of breath and regular headaches. She is not exercising regularly.     H/o R cerebellar stroke:  She is taking her statin,BP medications and warfarin as prescribed.    Neuropathy:  She saw neurology in Jan and likely has mild peripheral neuropathy.  It could be related to her diabetes or idiopathic.  Monitor for now only - would consider EMG if worsens.  Medications and allergies reviewed with patient and updated if appropriate.  Patient Active Problem List   Diagnosis Date Noted  . History of R cerebellar stroke 08/11/2018  . Viral URI with cough 05/21/2018  . B12 deficiency 02/04/2018  . Allergic rhinitis 08/06/2017  . Diabetes ([place]) 09/27/2015  . Peripheral neuropathy 09/22/2015  . Encounter for therapeutic drug monitoring 07/25/2013  . Macular degeneration, wet  ([place])   . LBBB (left bundle branch block) 03/21/2011  . Long term (current) use of anticoagulants 09/08/2010  . Carotid artery disease ([place]) 08/31/2009  . OBESITY 08/23/2009  . Mixed hyperlipidemia 12/16/2008  . Essential hypertension, benign 12/16/2008  . CAD, NATIVE VESSEL 12/16/2008  . ATRIAL FIBRILLATION 12/16/2008    Current Outpatient Medications on File Prior to Visit  Medication Sig Dispense Refill  . acetaminophen (TYLENOL) 500 MG tablet Take 500 mg by mouth every 6 (six) hours as needed.    Marland Kitchen aspirin 81 MG tablet Take 81 mg by mouth daily.      Marland Kitchen azelastine (ASTELIN) 0.1 % nasal spray Place 2 sprays into the nose 2 (two) times daily as needed for rhinitis or allergies.    Marland Kitchen b complex vitamins tablet Take 1 tablet by mouth daily.    Marland Kitchen diltiazem (CARDIZEM CD) 120 MG 24 hr capsule TAKE ONE CAPSULE BY MOUTH NIGHTLY AT BEDTIME 90 capsule 3  . Fexofenadine HCl (ALLEGRA ALLERGY PO) Take 1 tablet by mouth daily.     . furosemide (LASIX) 20 MG tablet TAKE ONE TABLET BY MOUTH DAILY 90 tablet 2  . isosorbide mononitrate (IMDUR) 30 MG 24 hr tablet TAKE 1 TABLET (30 MG TOTAL) BY MOUTH AT BEDTIME. 90 tablet 2  . lisinopril (PRINIVIL,ZESTRIL) 10 MG tablet TAKE 1 TABLET BY MOUTH DAILY 90 tablet 2  . meclizine (ANTIVERT) 25 MG tablet Take 1 tablet (25 mg  total) by mouth 3 (three) times daily as needed for dizziness. 30 tablet 0  . metoprolol succinate (TOPROL-XL) 100 MG 24 hr tablet Take 1 tablet (100 mg total) by mouth daily. 90 tablet 1  . Multiple Vitamin (MULTIVITAMIN WITH MINERALS) TABS tablet Take 1 tablet by mouth daily.    Marland Kitchen NITROSTAT 0.4 MG SL tablet PLACE 1 TABLET UNDER TONGUE EVERY 5 MINUTES AS NEEDED FOR CHEST PAIN 25 tablet 8  . potassium chloride (MICRO-K) 10 MEQ CR capsule TAKE 1 CAPSULE (10 MEQ TOTAL) BY MOUTH DAILY. 90 capsule 3  . simvastatin (ZOCOR) 40 MG tablet Please specify directions, refills and quantity 90 tablet 0  . tobramycin (TOBREX) 0.3 % ophthalmic solution Place 1  drop into both eyes daily as needed.    . warfarin (COUMADIN) 1 MG tablet TAKE 1 TABLET BY MOUTH DAILY (TAKE IN ADDITION TO 2.5MG ) 30 tablet 0  . warfarin (COUMADIN) 2.5 MG tablet Take 1 tablet daily or as directed by anticoagulation clinic. 90 tablet 1   No current facility-administered medications on file prior to visit.     Past Medical History:  Diagnosis Date  . Atrial fibrillation ([place])    a. chronic anticoag, failed prior DCCV;  b. 03/2010 Echo: EF 55-65%, mod MR, mildly to mod dil LA, mod dil RA, mild to mod MR.  Marland Kitchen CAD (coronary artery disease)    a. BMS to RCA 96';  b. Stent PDA and OM DES 2006  . CAROTID ARTERY DISEASE    a. 11/2011 Carotid U/S:  0-39% bilat dzs.  Marland Kitchen HTN (hypertension)   . Hyperlipidemia   . LBBB (left bundle branch block)   . Macular degeneration, wet ([place]) 10/2012 dx   L>R, follows with retinal specialist->injections monthly.  . Obesity   . Vertigo     Past Surgical History:  Procedure Laterality Date  . [place], 2006 x2   Drug-eluting stent placement to the PDA, drug-eluting stent  placement to the first obtuse  marginal, StarClose closure of the right common femoral arteriotomy site. Successful drug-eluting stent  placement in  both the posterior descending artery and the obtuse marginal. The lesion was directly stented using a 2.5 x 16 mm Taxus deployed at 14 atmospheres.   Marland Kitchen EYE SURGERY     Tear duct stent    Social History   Socioeconomic History  . Marital status: Widowed    Spouse name: Not on file  . Number of children: Not on file  . Years of education: Not on file  . Highest education level: Not on file  Occupational History  . Not on file  Social Needs  . Financial resource strain: Not on file  . Food insecurity:    Worry: Not on file    Inability: Not on file  . Transportation needs:    Medical: Not on file    Non-medical: Not on file  Tobacco Use  . Smoking status: Never Smoker  . Smokeless tobacco:  Never Used  Substance and Sexual Activity  . Alcohol use: No  . Drug use: No  . Sexual activity: Not on file  Lifestyle  . Physical activity:    Days per week: Not on file    Minutes per session: Not on file  . Stress: Not on file  Relationships  . Social connections:    Talks on phone: Not on file    Gets together: Not on file    Attends religious service: Not on file  Active member of club or organization: Not on file    Attends meetings of clubs or organizations: Not on file    Relationship status: Not on file  Other Topics Concern  . Not on file  Social History Narrative   Lives in [place] by herself.  She is active around the house w/o symptoms or limitations.    Family History  Problem Relation Age of Onset  . Arthritis Mother        died in her 59's  . Arthritis Father        died in [place].  Marland Kitchen CAD Brother        deceased  . CAD Brother        deceased  . CAD Sister        deceased    Review of Systems  Constitutional: Negative for fever.  Respiratory: Negative for choking, shortness of breath and wheezing.   Cardiovascular: Positive for palpitations (occ) and leg swelling (LLE only). Negative for chest pain.  Neurological: Positive for numbness. Negative for light-headedness and headaches.       Objective:   Vitals:   08/12/18 0946  BP: 136/64  Pulse: 79  Resp: 16  Temp: 98.4 F (36.9 C)  SpO2: 99%   BP Readings from Last 3 Encounters:  08/12/18 136/64  06/26/18 (!) 153/90  06/13/18 140/78   Wt Readings from Last 3 Encounters:  08/12/18 193 lb 12.8 oz (87.9 kg)  06/26/18 190 lb (86.2 kg)  06/13/18 190 lb (86.2 kg)   Body mass index is 32.25 kg/m.   Physical Exam    Constitutional: Appears well-developed and well-nourished. No distress.  HENT:  Head: Normocephalic and atraumatic.  Neck: Neck supple. No tracheal deviation present. No thyromegaly present.  No cervical lymphadenopathy Cardiovascular: Normal rate, regular rhythm and normal  heart sounds.   No murmur heard. No carotid bruit .  No edema Pulmonary/Chest: Effort normal and breath sounds normal. No respiratory distress. No has no wheezes. No rales.  Skin: Skin is warm and dry. Not diaphoretic.  Psychiatric: Normal mood and affect. Behavior is normal.      Assessment & Plan:    See Problem List for Assessment and Plan of chronic medical problems.

## 2018-08-11 NOTE — Patient Instructions (Addendum)
Try mild compression socks - 10-20 mm Hg.   Tests ordered today. Your results will be released to [place] (or called to you) after review, usually within 72hours after test completion. If any changes need to be made, you will be notified at that same time.   Medications reviewed and updated.  Changes include :   none   Please followup in 6 months

## 2018-08-12 ENCOUNTER — Encounter: Payer: Self-pay | Admitting: Internal Medicine

## 2018-08-12 ENCOUNTER — Ambulatory Visit (INDEPENDENT_AMBULATORY_CARE_PROVIDER_SITE_OTHER): Payer: Medicare Other | Admitting: Internal Medicine

## 2018-08-12 ENCOUNTER — Other Ambulatory Visit (INDEPENDENT_AMBULATORY_CARE_PROVIDER_SITE_OTHER): Payer: Medicare Other

## 2018-08-12 VITALS — BP 136/64 | HR 79 | Temp 98.4°F | Resp 16 | Ht 65.0 in | Wt 193.8 lb

## 2018-08-12 DIAGNOSIS — E782 Mixed hyperlipidemia: Secondary | ICD-10-CM

## 2018-08-12 DIAGNOSIS — E119 Type 2 diabetes mellitus without complications: Secondary | ICD-10-CM

## 2018-08-12 DIAGNOSIS — I4891 Unspecified atrial fibrillation: Secondary | ICD-10-CM

## 2018-08-12 DIAGNOSIS — G6289 Other specified polyneuropathies: Secondary | ICD-10-CM

## 2018-08-12 DIAGNOSIS — Z8673 Personal history of transient ischemic attack (TIA), and cerebral infarction without residual deficits: Secondary | ICD-10-CM

## 2018-08-12 DIAGNOSIS — I10 Essential (primary) hypertension: Secondary | ICD-10-CM

## 2018-08-12 DIAGNOSIS — I2510 Atherosclerotic heart disease of native coronary artery without angina pectoris: Secondary | ICD-10-CM | POA: Diagnosis not present

## 2018-08-12 LAB — LIPID PANEL
Cholesterol: 210 mg/dL — ABNORMAL HIGH (ref 0–200)
HDL: 51.2 mg/dL (ref >39.00)
NonHDL: 159.15
TRIGLYCERIDES: 278 mg/dL — AB (ref 0.0–149.0)
Total CHOL/HDL Ratio: 4
VLDL: 55.6 mg/dL — ABNORMAL HIGH (ref 0.0–40.0)

## 2018-08-12 LAB — CBC WITH DIFFERENTIAL/PLATELET
BASOS ABS: 0.1 10*3/uL (ref 0.0–0.1)
Basophils Relative: 0.8 % (ref 0.0–3.0)
EOS ABS: 0.2 10*3/uL (ref 0.0–0.7)
Eosinophils Relative: 3.3 % (ref 0.0–5.0)
HCT: 41.4 % (ref 36.0–46.0)
Hemoglobin: 14 g/dL (ref 12.0–15.0)
Lymphocytes Relative: 31.9 % (ref 12.0–46.0)
Lymphs Abs: 2.3 10*3/uL (ref 0.7–4.0)
MCHC: 33.7 g/dL (ref 30.0–36.0)
MCV: 90.6 fl (ref 78.0–100.0)
Monocytes Absolute: 0.7 10*3/uL (ref 0.1–1.0)
Monocytes Relative: 9.1 % (ref 3.0–12.0)
Neutro Abs: 4 10*3/uL (ref 1.4–7.7)
Neutrophils Relative %: 54.9 % (ref 43.0–77.0)
Platelets: 229 10*3/uL (ref 150.0–400.0)
RBC: 4.56 Mil/uL (ref 3.87–5.11)
RDW: 14.7 % (ref 11.5–15.5)
WBC: 7.2 10*3/uL (ref 4.0–10.5)

## 2018-08-12 LAB — HEMOGLOBIN A1C: Hgb A1c MFr Bld: 6.2 % (ref 4.6–6.5)

## 2018-08-12 LAB — LDL CHOLESTEROL, DIRECT: Direct LDL: 125 mg/dL

## 2018-08-12 LAB — COMPREHENSIVE METABOLIC PANEL
ALT: 15 U/L (ref 0–35)
AST: 19 U/L (ref 0–37)
Albumin: 4.4 g/dL (ref 3.5–5.2)
Alkaline Phosphatase: 71 U/L (ref 39–117)
BUN: 14 mg/dL (ref 6–23)
CALCIUM: 9.6 mg/dL (ref 8.4–10.5)
CO2: 25 mEq/L (ref 19–32)
Chloride: 106 mEq/L (ref 96–112)
Creatinine, Ser: 0.89 mg/dL (ref 0.40–1.20)
GFR: 60.13 mL/min (ref >60.00)
Glucose, Bld: 105 mg/dL — ABNORMAL HIGH (ref 70–99)
POTASSIUM: 4.1 meq/L (ref 3.5–5.1)
Sodium: 141 mEq/L (ref 135–145)
Total Bilirubin: 0.5 mg/dL (ref 0.2–1.2)
Total Protein: 7.4 g/dL (ref 6.0–8.3)

## 2018-08-12 NOTE — Assessment & Plan Note (Signed)
BP well controlled Current regimen effective and well tolerated Continue current medications at current doses cmp  

## 2018-08-12 NOTE — Assessment & Plan Note (Signed)
No chest pain, shortness of breath On metoprolol, statin CBC, CMP, lipid panel

## 2018-08-12 NOTE — Assessment & Plan Note (Signed)
Diet controlled Check A1c Continue low sugar/carbohydrate diet Not able to exercise-she does do some walking-encouraged as much activity as possible

## 2018-08-12 NOTE — Assessment & Plan Note (Signed)
Controlled Continue diltiazem, warfarin Following with cardiology CBC, CMP

## 2018-08-12 NOTE — Assessment & Plan Note (Signed)
Check lipid panel  Continue daily statin Regular exercise and healthy diet encouraged  

## 2018-08-12 NOTE — Assessment & Plan Note (Addendum)
Numbness/tingling bilateral feet-left is worse, some imbalance History of B12 deficiency-taking B12 Discussed medication options, but discussed they are more likely to help pain and numbness/tingling Continue to check feet daily

## 2018-08-15 NOTE — Progress Notes (Deleted)
Patient ID: Judy [name], female   DOB: 1931/04/05, 85 y.o.   MRN: [id number]    85 y.o.  female with prior history of coronary artery disease status post prior stenting of the right coronary artery in 1996 and subsequent stenting of the PDA and OM in 2006. Chronic afib on anticoagulation with rate control She lives by herself in [place] and is quite active around her home without symptoms or limitations. She does have macular degeneration requires monthly injections of the left eye and every other monthly injections into the right eye.  Follows with Dr Tye Savoy for her eye issues  09/2016   1-39% ICA Duplex  No angina compliant with meds Primary follows labs for HLD on statin   ***   ROS: Denies fever, malais, weight loss, blurry vision, decreased visual acuity, cough, sputum, SOB, hemoptysis, pleuritic pain, palpitaitons, heartburn, abdominal pain, melena, lower extremity edema, claudication, or rash.  All other systems reviewed and negative  General: Affect appropriate Elderly white female  HEENT: poor vision macular degeneration  Neck supple with no adenopathy JVP normal no bruits no thyromegaly Lungs clear with no wheezing and good diaphragmatic motion Heart:  S1/S2 no murmur, no rub, gallop or click PMI normal Abdomen: benighn, BS positve, no tenderness, no AAA no bruit.  No HSM or HJR Distal pulses intact with no bruits No edema Neuro non-focal Skin warm and dry No muscular weakness     Current Outpatient Medications  Medication Sig Dispense Refill  . acetaminophen (TYLENOL) 500 MG tablet Take 500 mg by mouth every 6 (six) hours as needed.    Marland Kitchen aspirin 81 MG tablet Take 81 mg by mouth daily.      Marland Kitchen azelastine (ASTELIN) 0.1 % nasal spray Place 2 sprays into the nose 2 (two) times daily as needed for rhinitis or allergies.    Marland Kitchen b complex vitamins tablet Take 1 tablet by mouth daily.    Marland Kitchen diltiazem (CARDIZEM CD) 120 MG 24 hr capsule TAKE ONE CAPSULE BY MOUTH NIGHTLY AT  BEDTIME 90 capsule 3  . Fexofenadine HCl (ALLEGRA ALLERGY PO) Take 1 tablet by mouth daily.     . furosemide (LASIX) 20 MG tablet TAKE ONE TABLET BY MOUTH DAILY 90 tablet 2  . isosorbide mononitrate (IMDUR) 30 MG 24 hr tablet TAKE 1 TABLET (30 MG TOTAL) BY MOUTH AT BEDTIME. 90 tablet 2  . lisinopril (PRINIVIL,ZESTRIL) 10 MG tablet TAKE 1 TABLET BY MOUTH DAILY 90 tablet 2  . meclizine (ANTIVERT) 25 MG tablet Take 1 tablet (25 mg total) by mouth 3 (three) times daily as needed for dizziness. 30 tablet 0  . metoprolol succinate (TOPROL-XL) 100 MG 24 hr tablet Take 1 tablet (100 mg total) by mouth daily. 90 tablet 1  . Multiple Vitamin (MULTIVITAMIN WITH MINERALS) TABS tablet Take 1 tablet by mouth daily.    Marland Kitchen NITROSTAT 0.4 MG SL tablet PLACE 1 TABLET UNDER TONGUE EVERY 5 MINUTES AS NEEDED FOR CHEST PAIN 25 tablet 8  . potassium chloride (MICRO-K) 10 MEQ CR capsule TAKE 1 CAPSULE (10 MEQ TOTAL) BY MOUTH DAILY. 90 capsule 3  . simvastatin (ZOCOR) 40 MG tablet Please specify directions, refills and quantity 90 tablet 0  . tobramycin (TOBREX) 0.3 % ophthalmic solution Place 1 drop into both eyes daily as needed.    . warfarin (COUMADIN) 1 MG tablet TAKE 1 TABLET BY MOUTH DAILY (TAKE IN ADDITION TO 2.5MG ) 30 tablet 0  . warfarin (COUMADIN) 2.5 MG tablet Take 1 tablet daily  or as directed by anticoagulation clinic. 90 tablet 1   No current facility-administered medications for this visit.     Allergies  Betadine [povidone iodine] and Methocarbamol  Electrocardiogram:  afib low voltage non specific ST chages  10/14  03/09/14  afib rate 72 ? Old IMI  04/28/15 afib rate 74 Old IMI 06/30/16 afib rate 100 old IMI  02/13/18 afib rate 69 aberrant beats   Assessment and Plan CAD: Stable with no angina and good activity level.  Continue medical Rx Carotid: plaque no stenosis f/u duplex 09/2018 Afib: chronic good rate control and anticoagulation  Anticoagulation:  No bleeding issues INR Rx gets checked every 6  weeks  Chol::  Labs with primary continue pravachol   F/U in a year   [employer]

## 2018-08-22 ENCOUNTER — Other Ambulatory Visit: Payer: Self-pay | Admitting: Internal Medicine

## 2018-08-27 ENCOUNTER — Ambulatory Visit: Payer: Medicare Other | Admitting: Cardiovascular Disease

## 2018-08-30 ENCOUNTER — Other Ambulatory Visit: Payer: Self-pay

## 2018-08-30 ENCOUNTER — Ambulatory Visit (INDEPENDENT_AMBULATORY_CARE_PROVIDER_SITE_OTHER): Payer: Medicare Other | Admitting: General Practice

## 2018-08-30 DIAGNOSIS — I4891 Unspecified atrial fibrillation: Secondary | ICD-10-CM

## 2018-08-30 DIAGNOSIS — Z7901 Long term (current) use of anticoagulants: Secondary | ICD-10-CM

## 2018-08-30 LAB — POCT INR: INR: 2.5 (ref 2.0–3.0)

## 2018-08-30 NOTE — Patient Instructions (Signed)
Pre visit review using our clinic review tool, if applicable. No additional management support is needed unless otherwise documented below in the visit note.   Continue to take 1 tablet daily except nothing on Fridays.  Re-check in 6 weeks.  

## 2018-08-30 NOTE — Progress Notes (Signed)
Agree with management.  Marthella Osorno J Londynn Sonoda, MD  

## 2018-09-03 ENCOUNTER — Other Ambulatory Visit: Payer: Self-pay | Admitting: Cardiovascular Disease

## 2018-09-10 DIAGNOSIS — H353231 Exudative age-related macular degeneration, bilateral, with active choroidal neovascularization: Secondary | ICD-10-CM | POA: Diagnosis not present

## 2018-09-11 ENCOUNTER — Telehealth: Payer: Self-pay

## 2018-09-11 NOTE — Telephone Encounter (Signed)
Called patient about making a virtual visit in April. Patient was originally scheduled on 3/17 and appt was moved to 4/24. This appointment will need to be virtual, telephone, or rescheduled to a later date.

## 2018-09-18 ENCOUNTER — Encounter (HOSPITAL_COMMUNITY): Payer: Medicare Other

## 2018-09-20 NOTE — Telephone Encounter (Signed)
Follow Up:; ° ° °Returning your call. °

## 2018-09-20 NOTE — Telephone Encounter (Signed)
Left message for patient to call back  

## 2018-09-20 NOTE — Telephone Encounter (Signed)
Called patient back. Patient is refusing to do virtual visit. Rescheduled her to August.

## 2018-10-04 ENCOUNTER — Ambulatory Visit: Payer: Medicare Other | Admitting: Cardiovascular Disease

## 2018-10-11 ENCOUNTER — Ambulatory Visit (INDEPENDENT_AMBULATORY_CARE_PROVIDER_SITE_OTHER): Payer: Medicare Other | Admitting: General Practice

## 2018-10-11 DIAGNOSIS — Z7901 Long term (current) use of anticoagulants: Secondary | ICD-10-CM

## 2018-10-11 DIAGNOSIS — I4891 Unspecified atrial fibrillation: Secondary | ICD-10-CM

## 2018-10-11 LAB — POCT INR: INR: 2.4 (ref 2.0–3.0)

## 2018-10-11 NOTE — Patient Instructions (Signed)
Pre visit review using our clinic review tool, if applicable. No additional management support is needed unless otherwise documented below in the visit note.  Continue to take 1 tablet daily except nothing on Fridays.  Re-check in 6 weeks.

## 2018-10-13 NOTE — Progress Notes (Signed)
Agree with management.  Judy [name] J Judy Apps, MD  

## 2018-10-21 ENCOUNTER — Encounter (HOSPITAL_COMMUNITY): Payer: Medicare Other

## 2018-11-22 ENCOUNTER — Ambulatory Visit (INDEPENDENT_AMBULATORY_CARE_PROVIDER_SITE_OTHER): Payer: Medicare Other | Admitting: General Practice

## 2018-11-22 ENCOUNTER — Other Ambulatory Visit: Payer: Self-pay

## 2018-11-22 DIAGNOSIS — Z7901 Long term (current) use of anticoagulants: Secondary | ICD-10-CM

## 2018-11-22 DIAGNOSIS — I4891 Unspecified atrial fibrillation: Secondary | ICD-10-CM

## 2018-11-22 LAB — POCT INR: INR: 2.1 (ref 2.0–3.0)

## 2018-11-22 NOTE — Patient Instructions (Addendum)
Pre visit review using our clinic review tool, if applicable. No additional management support is needed unless otherwise documented below in the visit note.   Continue to take 1 tablet daily except nothing on Fridays.  Re-check in 6 weeks.

## 2018-12-03 DIAGNOSIS — H353231 Exudative age-related macular degeneration, bilateral, with active choroidal neovascularization: Secondary | ICD-10-CM | POA: Diagnosis not present

## 2018-12-18 ENCOUNTER — Other Ambulatory Visit: Payer: Self-pay

## 2018-12-18 ENCOUNTER — Ambulatory Visit (HOSPITAL_COMMUNITY)
Admission: RE | Admit: 2018-12-18 | Discharge: 2018-12-18 | Disposition: A | Discharge status: Home / Self Care | Payer: Medicare Other | Source: Ambulatory Visit | Attending: Cardiology | Admitting: Cardiology

## 2018-12-18 DIAGNOSIS — I6523 Occlusion and stenosis of bilateral carotid arteries: Secondary | ICD-10-CM

## 2018-12-25 DIAGNOSIS — Z85828 Personal history of other malignant neoplasm of skin: Secondary | ICD-10-CM | POA: Diagnosis not present

## 2018-12-25 DIAGNOSIS — D3610 Benign neoplasm of peripheral nerves and autonomic nervous system, unspecified: Secondary | ICD-10-CM | POA: Diagnosis not present

## 2018-12-25 DIAGNOSIS — W57XXXA Bitten or stung by nonvenomous insect and other nonvenomous arthropods, initial encounter: Secondary | ICD-10-CM | POA: Diagnosis not present

## 2018-12-25 DIAGNOSIS — L821 Other seborrheic keratosis: Secondary | ICD-10-CM | POA: Diagnosis not present

## 2019-01-02 ENCOUNTER — Other Ambulatory Visit: Payer: Self-pay | Admitting: Cardiovascular Disease

## 2019-01-10 ENCOUNTER — Other Ambulatory Visit: Payer: Self-pay

## 2019-01-10 ENCOUNTER — Ambulatory Visit (INDEPENDENT_AMBULATORY_CARE_PROVIDER_SITE_OTHER): Payer: Medicare Other | Admitting: General Practice

## 2019-01-10 DIAGNOSIS — Z7901 Long term (current) use of anticoagulants: Secondary | ICD-10-CM

## 2019-01-10 DIAGNOSIS — I4891 Unspecified atrial fibrillation: Secondary | ICD-10-CM

## 2019-01-10 LAB — POCT INR: INR: 2.5 (ref 2.0–3.0)

## 2019-01-10 NOTE — Patient Instructions (Signed)
Pre visit review using our clinic review tool, if applicable. No additional management support is needed unless otherwise documented below in the visit note.  Continue to take 1 tablet daily except nothing on Fridays.  Re-check in 6 weeks.

## 2019-01-16 ENCOUNTER — Other Ambulatory Visit: Payer: Self-pay | Admitting: *Deleted

## 2019-01-16 ENCOUNTER — Telehealth: Payer: Self-pay | Admitting: Nurse Practitioner

## 2019-01-16 NOTE — Progress Notes (Signed)
Virtual Visit via Video Note   This visit type was conducted due to national recommendations for restrictions regarding the COVID-19 Pandemic (e.g. social distancing) in an effort to limit this patient's exposure and mitigate transmission in our community.  Due to her co-morbid illnesses, this patient is at least at moderate risk for complications without adequate follow up.  This format is felt to be most appropriate for this patient at this time.  All issues noted in this document were discussed and addressed.  A limited physical exam was performed with this format.  Please refer to the patient's chart for her consent to telehealth for [facility].   Date:  01/20/2019   ID:  Judy [name], DOB Nov 06, 1930, MRN [id number]  Patient Location: Home Provider Location: Office  PCP:  Binnie Rail, MD  Cardiologist:   Johnsie Cancel Electrophysiologist:  None   Evaluation Performed:  Follow-Up Visit  Chief Complaint:  CAD/AFib  History of Present Illness:    85 y.o. history of CAD. First stent RCA 1996 and PDA/OM in 2006. Distant cerebellar stroke No carotid disease. Chronic afib on coumadin. Activity limited by macular degeneration and peripheral neuropathy. Has seen primary and neurology on B12 ? From diet controlled DM as well On statin for HLD   No palpitations, bleeding , chest pain   She has no complaints wants to come into office for ECG   The patient  does not have symptoms concerning for COVID-19 infection (fever, chills, cough, or new shortness of breath).    Past Medical History:  Diagnosis Date  . Atrial fibrillation ([place])    a. chronic anticoag, failed prior DCCV;  b. 03/2010 Echo: EF 55-65%, mod MR, mildly to mod dil LA, mod dil RA, mild to mod MR.  Marland Kitchen CAD (coronary artery disease)    a. BMS to RCA 96';  b. Stent PDA and OM DES 2006  . CAROTID ARTERY DISEASE    a. 11/2011 Carotid U/S:  0-39% bilat dzs.  Marland Kitchen HTN (hypertension)   . Hyperlipidemia   . LBBB (left bundle branch  block)   . Macular degeneration, wet ([place]) 10/2012 dx   L>R, follows with retinal specialist->injections monthly.  . Obesity   . Vertigo    Past Surgical History:  Procedure Laterality Date  . [place], 2006 x2   Drug-eluting stent placement to the PDA, drug-eluting stent  placement to the first obtuse  marginal, StarClose closure of the right common femoral arteriotomy site. Successful drug-eluting stent  placement in  both the posterior descending artery and the obtuse marginal. The lesion was directly stented using a 2.5 x 16 mm Taxus deployed at 14 atmospheres.   Marland Kitchen EYE SURGERY     Tear duct stent     No outpatient medications have been marked as taking for the 01/20/19 encounter (Telemedicine) with Josue Hector, MD.     Allergies:   Betadine [povidone iodine] and Methocarbamol   Social History   Tobacco Use  . Smoking status: Never Smoker  . Smokeless tobacco: Never Used  Substance Use Topics  . Alcohol use: No  . Drug use: No     Family Hx: The patient's family history includes Arthritis in her father and mother; CAD in her brother, brother, and sister.  ROS:   Please see the history of present illness.     All other systems reviewed and are negative.   Prior CV studies:   The following studies were reviewed today:  Carotid 12/18/18 plaque no stenosis   Labs/Other Tests and Data Reviewed:    EKG:  02/13/18 afib old IMI nonspecific ST chagnes   Recent Labs: 08/12/2018: ALT 15; BUN 14; Creatinine, Ser 0.89; Hemoglobin 14.0; Platelets 229.0; Potassium 4.1; Sodium 141   Recent Lipid Panel Lab Results  Component Value Date/Time   CHOL 210 (H) 08/12/2018 10:52 AM   TRIG 278.0 (H) 08/12/2018 10:52 AM   HDL 51.20 08/12/2018 10:52 AM   CHOLHDL 4 08/12/2018 10:52 AM   LDLCALC 100 (H) 09/22/2015 09:04 AM   LDLDIRECT 125.0 08/12/2018 10:52 AM    Wt Readings from Last 3 Encounters:  01/20/19 196 lb (88.9 kg)  08/12/18 193 lb 12.8 oz (87.9 kg)   06/26/18 190 lb (86.2 kg)     Objective:    Vital Signs:  BP 116/65 (BP Location: Left Arm, Patient Position: Sitting, Cuff Size: Normal)   Pulse 74   Ht 5\' 3"  (1.6 m)   Wt 196 lb (88.9 kg)   BMI 34.72 kg/m    Skin warm and dry No distress No tachypnea No JVP elevation  Neuro appears non focal No edema  ASSESSMENT & PLAN:    CAD:  Distant intervention to RCA and OM last in 2006 no angina continue medical RX given age  HLD:  Continue statin labs with primary   DM:  Diet controlled A1c 6.12 August 2018  Afib:  Good rate control INR consistently Rx last 2.5 on 01/10/19   Neuropathy:  On B12 ? Related to DM f/u neurology   Macular Degeneration: f/u Dr Tye Savoy has had injections and improved  COVID-19 Education: The signs and symptoms of COVID-19 were discussed with the patient and how to seek care for testing (follow up with PCP or arrange E-visit).  The importance of social distancing was discussed today.  Time:   Today, I have spent 30 minutes with the patient with telehealth technology discussing the above problems.     Medication Adjustments/Labs and Tests Ordered: Current medicines are reviewed at length with the patient today.  Concerns regarding medicines are outlined above.   Tests Ordered:  ECG   Medication Changes:  None   Disposition:  Follow up in person in 6 months   Signed, Jenkins Rouge, MD  01/20/2019 8:44 AM    [place]

## 2019-01-16 NOTE — Telephone Encounter (Signed)
Patient consents to virtual visit on 8/10 with Dr. Johnsie Cancel. She is concerned about not having an EKG and I advised that if Dr. Johnsie Cancel agrees that it is warranted, we can schedule a time for her to come in the office for an EKG. She verbalized understanding and agreement with plan.   YOUR CARDIOLOGY TEAM HAS ARRANGED FOR AN E-VISIT FOR YOUR APPOINTMENT - PLEASE REVIEW IMPORTANT INFORMATION BELOW SEVERAL DAYS PRIOR TO YOUR APPOINTMENT  Due to the recent COVID-19 pandemic, we are transitioning in-person office visits to tele-medicine visits in an effort to decrease unnecessary exposure to our patients, their families, and staff. These visits are billed to your insurance just like a normal visit is. We also encourage you to sign up for MyChart if you have not already done so. You will need a smartphone if possible. For patients that do not have this, we can still complete the visit using a regular telephone but do prefer a smartphone to enable video when possible. You may have a family member that lives with you that can help. If possible, we also ask that you have a blood pressure cuff and scale at home to measure your blood pressure, heart rate and weight prior to your scheduled appointment. Patients with clinical needs that need an in-person evaluation and testing will still be able to come to the office if absolutely necessary. If you have any questions, feel free to call our office.   YOUR PROVIDER WILL BE USING THE FOLLOWING PLATFORM TO COMPLETE YOUR VISIT: Telephone    2-3 DAYS BEFORE YOUR APPOINTMENT  You will receive a telephone call from one of our [facility] team members - your caller ID may say "Unknown caller." If this is a video visit, we will walk you through how to get the video launched on your phone. We will remind you check your blood pressure, heart rate and weight prior to your scheduled appointment. If you have an Apple Watch or Kardia, please upload any pertinent ECG strips the day  before or morning of your appointment to [facility]. Our staff will also make sure you have reviewed the consent and agree to move forward with your scheduled tele-health visit.    THE DAY OF YOUR APPOINTMENT  Approximately 15 minutes prior to your scheduled appointment, you will receive a telephone call from one of [facility] team - your caller ID may say "Unknown caller."  Our staff will confirm medications, vital signs for the day and any symptoms you may be experiencing. Please have this information available prior to the time of visit start. It may also be helpful for you to have a pad of paper and pen handy for any instructions given during your visit. They will also walk you through joining the smartphone meeting if this is a video visit.   CONSENT FOR TELE-HEALTH VISIT - PLEASE REVIEW  I hereby voluntarily request, consent and authorize [facility] and its employed or contracted physicians, physician assistants, nurse practitioners or other licensed health care professionals (the Practitioner), to provide me with telemedicine health care services (the "Services") as deemed necessary by the treating Practitioner. I acknowledge and consent to receive the Services by the Practitioner via telemedicine. I understand that the telemedicine visit will involve communicating with the Practitioner through live audiovisual communication technology and the disclosure of certain medical information by electronic transmission. I acknowledge that I have been given the opportunity to request an in-person assessment or other available alternative prior to the telemedicine visit and am voluntarily  participating in the telemedicine visit.  I understand that I have the right to withhold or withdraw my consent to the use of telemedicine in the course of my care at any time, without affecting my right to future care or treatment, and that the Practitioner or I may terminate the telemedicine visit at any time. I  understand that I have the right to inspect all information obtained and/or recorded in the course of the telemedicine visit and may receive copies of available information for a reasonable fee.  I understand that some of the potential risks of receiving the Services via telemedicine include:  Marland Kitchen Delay or interruption in medical evaluation due to technological equipment failure or disruption; . Information transmitted may not be sufficient (e.g. poor resolution of images) to allow for appropriate medical decision making by the Practitioner; and/or  . In rare instances, security protocols could fail, causing a breach of personal health information.  Furthermore, I acknowledge that it is my responsibility to provide information about my medical history, conditions and care that is complete and accurate to the best of my ability. I acknowledge that Practitioner's advice, recommendations, and/or decision may be based on factors not within their control, such as incomplete or inaccurate data provided by me or distortions of diagnostic images or specimens that may result from electronic transmissions. I understand that the practice of medicine is not an exact science and that Practitioner makes no warranties or guarantees regarding treatment outcomes. I acknowledge that I will receive a copy of this consent concurrently upon execution via email to the email address I last provided but may also request a printed copy by calling the office of [facility].    I understand that my insurance will be billed for this visit.   I have read or had this consent read to me. . I understand the contents of this consent, which adequately explains the benefits and risks of the Services being provided via telemedicine.  . I have been provided ample opportunity to ask questions regarding this consent and the Services and have had my questions answered to my satisfaction. . I give my informed consent for the services to be  provided through the use of telemedicine in my medical care  By participating in this telemedicine visit I agree to the above.

## 2019-01-20 ENCOUNTER — Other Ambulatory Visit: Payer: Self-pay

## 2019-01-20 ENCOUNTER — Telehealth (INDEPENDENT_AMBULATORY_CARE_PROVIDER_SITE_OTHER): Payer: Medicare Other | Admitting: Cardiovascular Disease

## 2019-01-20 DIAGNOSIS — E785 Hyperlipidemia, unspecified: Secondary | ICD-10-CM | POA: Diagnosis not present

## 2019-01-20 DIAGNOSIS — E11311 Type 2 diabetes mellitus with unspecified diabetic retinopathy with macular edema: Secondary | ICD-10-CM | POA: Diagnosis not present

## 2019-01-20 DIAGNOSIS — Z7901 Long term (current) use of anticoagulants: Secondary | ICD-10-CM | POA: Diagnosis not present

## 2019-01-20 DIAGNOSIS — I482 Chronic atrial fibrillation, unspecified: Secondary | ICD-10-CM

## 2019-01-20 DIAGNOSIS — Z955 Presence of coronary angioplasty implant and graft: Secondary | ICD-10-CM | POA: Diagnosis not present

## 2019-01-20 DIAGNOSIS — E1142 Type 2 diabetes mellitus with diabetic polyneuropathy: Secondary | ICD-10-CM | POA: Diagnosis not present

## 2019-01-20 DIAGNOSIS — I2510 Atherosclerotic heart disease of native coronary artery without angina pectoris: Secondary | ICD-10-CM | POA: Diagnosis not present

## 2019-01-20 NOTE — Patient Instructions (Addendum)
Medication Instructions:  Your physician recommends that you continue on your current medications as directed. Please refer to the Current Medication list given to you today.  If you need a refill on your cardiac medications before your next appointment, please call your pharmacy.    Lab work: None Ordered   Testing/Procedures: None Ordered   Follow-Up: Your physician recommends that you come into the office on Friday August 14 at 11:45 am for EKG   At [facility], you and your health needs are our priority.  As part of our continuing mission to provide you with exceptional heart care, we have created designated Provider Care Teams.  These Care Teams include your primary Cardiologist (physician) and Advanced Practice Providers (APPs -  Physician Assistants and Nurse Practitioners) who all work together to provide you with the care you need, when you need it. You will need a follow up appointment in 6 months.  Please call our office 2 months in advance to schedule this appointment.  You may see Jenkins Rouge, MD or one of the following Advanced Practice Providers on your designated Care Team:   Truitt Merle, NP Cecilie Kicks, NP . Kathyrn Drown, NP

## 2019-01-23 DIAGNOSIS — M25562 Pain in left knee: Secondary | ICD-10-CM | POA: Diagnosis not present

## 2019-01-23 DIAGNOSIS — M545 Low back pain: Secondary | ICD-10-CM | POA: Diagnosis not present

## 2019-01-24 ENCOUNTER — Ambulatory Visit: Payer: Medicare Other

## 2019-01-25 ENCOUNTER — Other Ambulatory Visit: Payer: Self-pay | Admitting: Internal Medicine

## 2019-01-30 ENCOUNTER — Telehealth: Payer: Self-pay | Admitting: Nurse Practitioner

## 2019-01-30 NOTE — Telephone Encounter (Signed)
Left message for patient regarding rescheduling her EKG that was scheduled for 8/14

## 2019-02-04 NOTE — Telephone Encounter (Signed)
Spoke with patient who states she had to cancel the ECG appointment at our office because she fell and hurt her knee. She states she has to find out when her daughter can bring her back. I advised that she has an appointment with her PCP, Dr. Quay Burow, next week and that I can send a note to ask Dr. Quay Burow to get an ECG at that appointment and send to Dr. Johnsie Cancel for review. Patient states that would be great so that she doesn't have to get her daughter to bring her to 2 different appointments. She thanked me for the call.

## 2019-02-04 NOTE — Telephone Encounter (Signed)
We can do an EKG next week - put note in visit info

## 2019-02-04 NOTE — Telephone Encounter (Signed)
Follow up    Patient is returning call in reference to EKG. She would like to discuss before she reschedules.

## 2019-02-06 NOTE — Telephone Encounter (Signed)
Has been entered.Marland KitchenJohny [name]

## 2019-02-10 ENCOUNTER — Telehealth: Payer: Self-pay | Admitting: Cardiovascular Disease

## 2019-02-10 DIAGNOSIS — Z5181 Encounter for therapeutic drug level monitoring: Secondary | ICD-10-CM

## 2019-02-10 DIAGNOSIS — I2510 Atherosclerotic heart disease of native coronary artery without angina pectoris: Secondary | ICD-10-CM

## 2019-02-10 DIAGNOSIS — E782 Mixed hyperlipidemia: Secondary | ICD-10-CM

## 2019-02-10 MED ORDER — ROSUVASTATIN CALCIUM 10 MG PO TABS: 10.0000 mg | ORAL_TABLET | Freq: Every day | ORAL | 1 refills | Status: DC

## 2019-02-10 MED ORDER — SIMVASTATIN 40 MG PO TABS: 40.0000 mg | ORAL_TABLET | Freq: Every day | ORAL | 2 refills | Status: DC

## 2019-02-10 NOTE — Telephone Encounter (Signed)
Pt's pharmacy is calling back to inform Dr. Johnsie Cancel about a interaction between pt's medication Diltiazem and Simvastatin. Pharmacy would like to know if Dr. Johnsie Cancel is aware of the interaction. Please address

## 2019-02-10 NOTE — Telephone Encounter (Signed)
Spoke with the pt and informed her that per Dr Johnsie Cancel, we will discontinue her Pravastatin 80 mg, due to the cost being too expensive, and send in for her to take Simvastatin 40 mg po daily, but she will need to come back to the office to have her lipids and liver enzymes checked in 3 months.  Confirmed the pharmacy of choice with the pt.  Pt requested a 90 day supply to be sent to her pharmacy.  Pt is aware that Simvastatin will now only cost her $6.  Scheduled the pt to come in for repeat labs to check lipid/liver in 3 months on 05/12/19 at 0915.  Pt is aware to come fasting to this lab appt.  Pt education provided on how to correctly fast for her lab appt.  Pt verbalized understanding and agrees with this plan. Pt was more than gracious for all the assistance provided.

## 2019-02-10 NOTE — Telephone Encounter (Signed)
Attempted to call the pt back and she did not answer, only a busy tone.  This is to make the switch now from simvastatin to rosuvastatin 10 mg po daily, for the Pharmacist at [facility] stated that there is an interaction b/t pts diltiazem and simvastatin, therefore Dr. Johnsie Cancel would like for her to take rosuvastatin 10 mg po daily instead.

## 2019-02-10 NOTE — Telephone Encounter (Signed)
Can change to crestor 10 mg then

## 2019-02-10 NOTE — Telephone Encounter (Signed)
Please address

## 2019-02-10 NOTE — Telephone Encounter (Signed)
Can do simvastatin 40 mg and f/u liver and lipid in 3 months

## 2019-02-10 NOTE — Addendum Note (Signed)
Addended by: Nuala Alpha on: 02/10/2019 02:28 PM   Modules accepted: Orders

## 2019-02-10 NOTE — Telephone Encounter (Signed)
Spoke with the pt and informed her that simvastatin interacts with her diltiazem, therefor Dr. Johnsie Cancel suggest that we now switch her to rosuvastatin 10 mg po daily, instead. Informed the pt that this med will only be $9. Pt requesting a 90 day supply of this med to be sent to her pharmacy. Advised the pt to keep her lab appt as scheduled for 11/30 to check lipid/liver.  Pt verbalized understanding and agrees with this plan.

## 2019-02-10 NOTE — Telephone Encounter (Signed)
Dr. Johnsie Cancel, the pt does not want to take Pravastatin 80 mg po daily anymore, due to the cost being $26.  [facility] has listed some other alternatives with the prices listed below:  Simvastatin 40 mg po daily will be $6  Rosuvastatin 10 mg po daily will be $9  Atorvastatin 20 mg po daily will be $14   Please advise on what regimen you would like the pt to take. Triage to follow-up with the pt thereafter.  Thanks!

## 2019-02-10 NOTE — Telephone Encounter (Signed)
[facility] pharmacy stating that pt is requesting a alternative lower cost medication for pravastatin 80 mg tablet, pt paying $26. Pharmacy stated that they have simvastatin 40 mg for $6, Rosuvastatin 10 mg for $9 and atorvastatin 20 mg for $14. Please address

## 2019-02-11 NOTE — Patient Instructions (Addendum)
  Your a1c was checked today.   Flu immunization administered today.  An EKG was done today.   Medications reviewed and updated.  Changes include :   none    Please followup in 6 months

## 2019-02-11 NOTE — Progress Notes (Signed)
Subjective:    Patient ID: Judy [name], female    DOB: December 23, 1930, 85 y.o.   MRN: [id number]  HPI The patient is here for follow up.  She is not exercising regularly.     Diabetes: She is controlling her sugars with diet. She is fairly  compliant with a diabetic diet.    She has numbness/tingling in her feet that is mild and intermittent.   Afib, Hypertension: She is taking her medication daily. She is compliant with a low sodium diet.  She denies chest pain, palpitations, edema, shortness of breath and regular headaches.  Hyperlipidemia: She is taking her medication daily. She is compliant with a low fat/cholesterol diet. She denies myalgias.   H/o CVA:  She is taking her statin and warfarin.  She is taking her blood pressure medication daily as prescribed.  Neuropathy, mild:  Her feet numbness/tingling is mild and is intermittent.  She is taking a B complex daily.  She is not on any medication for the neuropathy.    Medications and allergies reviewed with patient and updated if appropriate.  Patient Active Problem List   Diagnosis Date Noted  . History of R cerebellar stroke 08/11/2018  . B12 deficiency 02/04/2018  . Allergic rhinitis 08/06/2017  . Diabetes ([place]) 09/27/2015  . Peripheral neuropathy 09/22/2015  . Encounter for therapeutic drug monitoring 07/25/2013  . Macular degeneration, wet ([place])   . LBBB (left bundle branch block) 03/21/2011  . Long term (current) use of anticoagulants 09/08/2010  . Carotid artery disease ([place]) 08/31/2009  . OBESITY 08/23/2009  . Mixed hyperlipidemia 12/16/2008  . Essential hypertension, benign 12/16/2008  . CAD, NATIVE VESSEL 12/16/2008  . ATRIAL FIBRILLATION 12/16/2008    Current Outpatient Medications on File Prior to Visit  Medication Sig Dispense Refill  . acetaminophen (TYLENOL) 500 MG tablet Take 500 mg by mouth every 6 (six) hours as needed.    Marland Kitchen aspirin 81 MG tablet Take 81 mg by mouth daily.      Marland Kitchen azelastine  (ASTELIN) 0.1 % nasal spray Place 2 sprays into the nose 2 (two) times daily as needed for rhinitis or allergies.    Marland Kitchen b complex vitamins tablet Take 1 tablet by mouth daily.    Marland Kitchen diltiazem (CARDIZEM CD) 120 MG 24 hr capsule TAKE ONE CAPSULE BY MOUTH NIGHTLY AT BEDTIME 90 capsule 3  . Fexofenadine HCl (ALLEGRA ALLERGY PO) Take 1 tablet by mouth daily.     . furosemide (LASIX) 20 MG tablet TAKE ONE TABLET BY MOUTH DAILY 90 tablet 0  . isosorbide mononitrate (IMDUR) 30 MG 24 hr tablet TAKE 1 TABLET (30 MG TOTAL) BY MOUTH AT BEDTIME. 90 tablet 1  . lisinopril (PRINIVIL,ZESTRIL) 10 MG tablet TAKE 1 TABLET BY MOUTH DAILY 90 tablet 1  . meclizine (ANTIVERT) 25 MG tablet Take 1 tablet (25 mg total) by mouth 3 (three) times daily as needed for dizziness. 30 tablet 0  . metoprolol succinate (TOPROL-XL) 100 MG 24 hr tablet TAKE 1 TABLET BY MOUTH EVERY DAY 90 tablet 1  . Multiple Vitamin (MULTIVITAMIN WITH MINERALS) TABS tablet Take 1 tablet by mouth daily.    Marland Kitchen NITROSTAT 0.4 MG SL tablet PLACE 1 TABLET UNDER TONGUE EVERY 5 MINUTES AS NEEDED FOR CHEST PAIN 25 tablet 8  . potassium chloride (MICRO-K) 10 MEQ CR capsule TAKE 1 CAPSULE (10 MEQ TOTAL) BY MOUTH DAILY. 90 capsule 3  . rosuvastatin (CRESTOR) 10 MG tablet Take 1 tablet (10 mg total) by mouth daily.  90 tablet 1  . tobramycin (TOBREX) 0.3 % ophthalmic solution Place 1 drop into both eyes daily as needed.    . warfarin (COUMADIN) 1 MG tablet TAKE 1 TABLET BY MOUTH DAILY (TAKE IN ADDITION TO 2.5MG ) 30 tablet 0  . warfarin (COUMADIN) 2.5 MG tablet TAKE 1 TABLET DAILY OR AS DIRECTED BY ANTICOAGULATION CLINIC. 90 tablet 1   No current facility-administered medications on file prior to visit.     Past Medical History:  Diagnosis Date  . Atrial fibrillation ([place])    a. chronic anticoag, failed prior DCCV;  b. 03/2010 Echo: EF 55-65%, mod MR, mildly to mod dil LA, mod dil RA, mild to mod MR.  Marland Kitchen CAD (coronary artery disease)    a. BMS to RCA 96';  b.  Stent PDA and OM DES 2006  . CAROTID ARTERY DISEASE    a. 11/2011 Carotid U/S:  0-39% bilat dzs.  Marland Kitchen HTN (hypertension)   . Hyperlipidemia   . LBBB (left bundle branch block)   . Macular degeneration, wet ([place]) 10/2012 dx   L>R, follows with retinal specialist->injections monthly.  . Obesity   . Vertigo     Past Surgical History:  Procedure Laterality Date  . [place], 2006 x2   Drug-eluting stent placement to the PDA, drug-eluting stent  placement to the first obtuse  marginal, StarClose closure of the right common femoral arteriotomy site. Successful drug-eluting stent  placement in  both the posterior descending artery and the obtuse marginal. The lesion was directly stented using a 2.5 x 16 mm Taxus deployed at 14 atmospheres.   Marland Kitchen EYE SURGERY     Tear duct stent    Social History   Socioeconomic History  . Marital status: Widowed    Spouse name: Not on file  . Number of children: Not on file  . Years of education: Not on file  . Highest education level: Not on file  Occupational History  . Not on file  Social Needs  . Financial resource strain: Not on file  . Food insecurity    Worry: Not on file    Inability: Not on file  . Transportation needs    Medical: Not on file    Non-medical: Not on file  Tobacco Use  . Smoking status: Never Smoker  . Smokeless tobacco: Never Used  Substance and Sexual Activity  . Alcohol use: No  . Drug use: No  . Sexual activity: Not on file  Lifestyle  . Physical activity    Days per week: Not on file    Minutes per session: Not on file  . Stress: Not on file  Relationships  . Social Herbalist on phone: Not on file    Gets together: Not on file    Attends religious service: Not on file    Active member of club or organization: Not on file    Attends meetings of clubs or organizations: Not on file    Relationship status: Not on file  Other Topics Concern  . Not on file  Social History Narrative    Lives in [place] by herself.  She is active around the house w/o symptoms or limitations.    Family History  Problem Relation Age of Onset  . Arthritis Mother        died in her 40's  . Arthritis Father        died in [place].  Marland Kitchen CAD Brother  deceased  . CAD Brother        deceased  . CAD Sister        deceased    Review of Systems  Constitutional: Negative for chills and fever.  Respiratory: Negative for cough, shortness of breath and wheezing.   Cardiovascular: Negative for chest pain, palpitations and leg swelling.  Neurological: Negative for light-headedness and headaches.       Objective:   Vitals:   02/12/19 0938  BP: 138/72  Pulse: 73  Resp: 16  Temp: 98.3 F (36.8 C)  SpO2: 99%   BP Readings from Last 3 Encounters:  02/12/19 138/72  01/20/19 116/65  08/12/18 136/64   Wt Readings from Last 3 Encounters:  02/12/19 190 lb (86.2 kg)  01/20/19 196 lb (88.9 kg)  08/12/18 193 lb 12.8 oz (87.9 kg)   Body mass index is 33.66 kg/m.   Physical Exam    Constitutional: Appears well-developed and well-nourished. No distress.  HENT:  Head: Normocephalic and atraumatic.  Neck: Neck supple. No tracheal deviation present. No thyromegaly present.  No cervical lymphadenopathy Cardiovascular: Normal rate, irregular rhythm and normal heart sounds.  No murmur heard. No carotid bruit .  No edema Pulmonary/Chest: Effort normal and breath sounds normal. No respiratory distress. No has no wheezes. No rales.  Skin: Skin is warm and dry. Not diaphoretic.  Psychiatric: Normal mood and affect. Behavior is normal.      Assessment & Plan:    See Problem List for Assessment and Plan of chronic medical problems.

## 2019-02-12 ENCOUNTER — Other Ambulatory Visit: Payer: Self-pay

## 2019-02-12 ENCOUNTER — Encounter: Payer: Self-pay | Admitting: Internal Medicine

## 2019-02-12 ENCOUNTER — Ambulatory Visit (INDEPENDENT_AMBULATORY_CARE_PROVIDER_SITE_OTHER): Payer: Medicare Other | Admitting: Internal Medicine

## 2019-02-12 VITALS — BP 138/72 | HR 73 | Temp 98.3°F | Resp 16 | Ht 63.0 in | Wt 190.0 lb

## 2019-02-12 DIAGNOSIS — I6523 Occlusion and stenosis of bilateral carotid arteries: Secondary | ICD-10-CM | POA: Diagnosis not present

## 2019-02-12 DIAGNOSIS — I4891 Unspecified atrial fibrillation: Secondary | ICD-10-CM | POA: Diagnosis not present

## 2019-02-12 DIAGNOSIS — Z8673 Personal history of transient ischemic attack (TIA), and cerebral infarction without residual deficits: Secondary | ICD-10-CM | POA: Diagnosis not present

## 2019-02-12 DIAGNOSIS — G6289 Other specified polyneuropathies: Secondary | ICD-10-CM

## 2019-02-12 DIAGNOSIS — I10 Essential (primary) hypertension: Secondary | ICD-10-CM | POA: Diagnosis not present

## 2019-02-12 DIAGNOSIS — E119 Type 2 diabetes mellitus without complications: Secondary | ICD-10-CM

## 2019-02-12 DIAGNOSIS — E538 Deficiency of other specified B group vitamins: Secondary | ICD-10-CM

## 2019-02-12 DIAGNOSIS — E782 Mixed hyperlipidemia: Secondary | ICD-10-CM | POA: Diagnosis not present

## 2019-02-12 LAB — POCT GLYCOSYLATED HEMOGLOBIN (HGB A1C): Hemoglobin A1C: 6.4 % — AB (ref 4.0–5.6)

## 2019-02-12 NOTE — Assessment & Plan Note (Signed)
BP well controlled Current regimen effective and well tolerated Continue current medications at current doses  

## 2019-02-12 NOTE — Assessment & Plan Note (Signed)
Taking Crestor, aspirin 81 mg, warfarin Blood pressure controlled Continue above

## 2019-02-12 NOTE — Assessment & Plan Note (Signed)
Following with cardiology Asymptomatic On warfarin, diltiazem EKG today stable-A. fib with rate of 68, QRS widening, inferior infarct-age undetermined.  No change compared to EKG from 2019

## 2019-02-12 NOTE — Assessment & Plan Note (Signed)
A1c today 6.4% Controlled by diet Follow-up in 6 months Advised eye exam by Dr. Bing Plume

## 2019-02-12 NOTE — Assessment & Plan Note (Signed)
Mild peripheral neuropathy Intermittent numbness/tingling in her feet No treatment needed at this time Continue B complex Sugars controlled

## 2019-02-12 NOTE — Assessment & Plan Note (Signed)
Statin just changed by cardiology Will be having cholesterol rechecked the end of this month

## 2019-02-16 ENCOUNTER — Other Ambulatory Visit: Payer: Self-pay | Admitting: Cardiovascular Disease

## 2019-02-16 ENCOUNTER — Other Ambulatory Visit: Payer: Self-pay | Admitting: Internal Medicine

## 2019-02-25 DIAGNOSIS — H353231 Exudative age-related macular degeneration, bilateral, with active choroidal neovascularization: Secondary | ICD-10-CM | POA: Diagnosis not present

## 2019-02-25 DIAGNOSIS — H35372 Puckering of macula, left eye: Secondary | ICD-10-CM | POA: Diagnosis not present

## 2019-02-25 DIAGNOSIS — H43393 Other vitreous opacities, bilateral: Secondary | ICD-10-CM | POA: Diagnosis not present

## 2019-02-25 DIAGNOSIS — H43813 Vitreous degeneration, bilateral: Secondary | ICD-10-CM | POA: Diagnosis not present

## 2019-02-27 ENCOUNTER — Ambulatory Visit (INDEPENDENT_AMBULATORY_CARE_PROVIDER_SITE_OTHER): Payer: Medicare Other | Admitting: Internal Medicine

## 2019-02-27 ENCOUNTER — Other Ambulatory Visit: Payer: Self-pay

## 2019-02-27 ENCOUNTER — Encounter: Payer: Self-pay | Admitting: Internal Medicine

## 2019-02-27 DIAGNOSIS — J029 Acute pharyngitis, unspecified: Secondary | ICD-10-CM

## 2019-02-27 DIAGNOSIS — I6523 Occlusion and stenosis of bilateral carotid arteries: Secondary | ICD-10-CM

## 2019-02-27 MED ORDER — AMOXICILLIN 500 MG PO CAPS: 500.0000 mg | ORAL_CAPSULE | Freq: Three times a day (TID) | ORAL | 0 refills | Status: DC

## 2019-02-27 NOTE — Progress Notes (Signed)
Virtual Visit via Video Note  I connected with Judy [name] on 02/27/19 at 10:45 AM EDT by a video enabled telemedicine application and verified that I am speaking with the correct person using two identifiers.   I discussed the limitations of evaluation and management by telemedicine and the availability of in person appointments. The patient expressed understanding and agreed to proceed.  The patient is currently in her car with her daughter and I am in the office.    No referring provider.    History of Present Illness: She is here for an acute visit for cold symptoms.   Her symptoms started couple of days ago  She is experiencing sore throat, throat discoloration and white spots on tongue and tonsils.  Her throat is red.  She also has some cough from drainage, postnasal drip, but denies any other symptoms.  She has tried taking gargling with salt water or Listerine.  She denies sick contacts.  She feels she is low risk for COVID.  She has been extremely careful taking all precautions.  She denies COVID exposure.  She has not been any antibiotics recently.  Review of Systems  Constitutional: Negative for chills and fever.  HENT: Positive for sore throat. Negative for congestion, ear pain and sinus pain.        PND, no change in taste or smell  Respiratory: Positive for cough (from drainage). Negative for shortness of breath and wheezing.   Gastrointestinal: Negative for diarrhea and nausea.  Musculoskeletal: Negative for myalgias.  Neurological: Negative for headaches.     Social History   Socioeconomic History  . Marital status: Widowed    Spouse name: Not on file  . Number of children: Not on file  . Years of education: Not on file  . Highest education level: Not on file  Occupational History  . Not on file  Social Needs  . Financial resource strain: Not on file  . Food insecurity    Worry: Not on file    Inability: Not on file  . Transportation needs   Medical: Not on file    Non-medical: Not on file  Tobacco Use  . Smoking status: Never Smoker  . Smokeless tobacco: Never Used  Substance and Sexual Activity  . Alcohol use: No  . Drug use: No  . Sexual activity: Not on file  Lifestyle  . Physical activity    Days per week: Not on file    Minutes per session: Not on file  . Stress: Not on file  Relationships  . Social Herbalist on phone: Not on file    Gets together: Not on file    Attends religious service: Not on file    Active member of club or organization: Not on file    Attends meetings of clubs or organizations: Not on file    Relationship status: Not on file  Other Topics Concern  . Not on file  Social History Narrative   Lives in [place] by herself.  She is active around the house w/o symptoms or limitations.     Observations/Objective: Appears well in NAD Unable to visualize her tongue and throat well enough to describe No obvious facial swelling No difficulty breathing or distress  Assessment and Plan:  See Problem List for Assessment and Plan of chronic medical problems.   Follow Up Instructions:    I discussed the assessment and treatment plan with the patient. The patient was provided an opportunity to ask  questions and all were answered. The patient agreed with the plan and demonstrated an understanding of the instructions.   The patient was advised to call back or seek an in-person evaluation if the symptoms worsen or if the condition fails to improve as anticipated.    Binnie Rail, MD

## 2019-02-27 NOTE — Assessment & Plan Note (Signed)
Symptoms are consistent with acute pharyngitis-likely bacterial We discussed options-we are limited in testing for this because we do not want her coming into the office if she is sick She is very low risk for COVID so we will hold off on COVID testing Empiric treatment for strep throat with amoxicillin Tylenol as needed Salt water gargles She will call if there is no improvement or if her symptoms change

## 2019-02-28 ENCOUNTER — Ambulatory Visit: Payer: Medicare Other

## 2019-03-01 ENCOUNTER — Other Ambulatory Visit: Payer: Self-pay | Admitting: Cardiovascular Disease

## 2019-03-03 ENCOUNTER — Telehealth: Payer: Self-pay | Admitting: Internal Medicine

## 2019-03-03 ENCOUNTER — Other Ambulatory Visit: Payer: Self-pay

## 2019-03-03 NOTE — Telephone Encounter (Signed)
Pt is calling again to see what she can have RXd for sinus infection.

## 2019-03-03 NOTE — Telephone Encounter (Signed)
FYI

## 2019-03-03 NOTE — Telephone Encounter (Signed)
If she still taking the amoxicillin?  That would cover a sinus infection.

## 2019-03-03 NOTE — Telephone Encounter (Signed)
Copied from [place] [id number]. Topic: General - Inquiry >> Mar 03, 2019  8:58 AM Richardo Priest, NT wrote: Reason for CRM: Patient called in stated she called the on call nurse on Saturday and believes she has a sinus infection. Patient is wondering if something can be called in for a sinus infection, as she has been taking mucinex for 4 days. Please advise.

## 2019-03-03 NOTE — Telephone Encounter (Signed)
Called [facility] 03/01/19 at 10:41am.  States that mom was given amoxicillin.  States patient now has spots on her throat and seemed fatigue.   Was given at home care instructions by [facility].

## 2019-03-03 NOTE — Telephone Encounter (Signed)
Was seen on 9/17.

## 2019-03-03 NOTE — Telephone Encounter (Signed)
Stop amoxicillin.  Start Omnicef-pending.  Continue Mucinex, saline nasal spray and other over-the-counter cold medications.

## 2019-03-03 NOTE — Telephone Encounter (Signed)
Shooting pains in R side of head. Productive cough clear phlegm. R ear pain and R throat pain. She has been taking Amoxicillin since Thursday and it has not helped her symptoms. Taking mucinex which has helped some with congestion. Please advise.

## 2019-03-04 MED ORDER — CEFDINIR 300 MG PO CAPS: 300.0000 mg | ORAL_CAPSULE | Freq: Two times a day (BID) | ORAL | 0 refills | Status: DC

## 2019-03-04 NOTE — Telephone Encounter (Signed)
Rx sent pt aware 

## 2019-03-07 ENCOUNTER — Ambulatory Visit: Payer: Medicare Other

## 2019-03-14 ENCOUNTER — Telehealth: Payer: Self-pay | Admitting: Internal Medicine

## 2019-03-14 ENCOUNTER — Ambulatory Visit: Payer: Medicare Other

## 2019-03-14 NOTE — Telephone Encounter (Signed)
Daughter is asking for any OTC suggestions for sore throat and cough that would be safe for her to take with medications.

## 2019-03-14 NOTE — Telephone Encounter (Signed)
Still have drainage, sore throat, nagging non productive cough, daughter said that throat looks ok but red. Pt states every time she eats something or puts a cough drop in her mouth it irritates her throat and makes her want to cough. No more than usual SOB, no fever, no chills, no body aches.

## 2019-03-14 NOTE — Telephone Encounter (Signed)
Pt called stating she has finished the antibiotic and it has helped some but has not taken the problem completely away. Please advise.

## 2019-03-14 NOTE — Telephone Encounter (Signed)
Plain Mucinex, Coricidin cold products, saline nasal spray.  She can continue her allergy medications, which should help.

## 2019-03-14 NOTE — Telephone Encounter (Signed)
We have two options - she can either go to urgent care for evaluation since they can listen to her and look at her throat or we can try a second antibiotic. ( doxycycline)

## 2019-03-14 NOTE — Telephone Encounter (Signed)
LVM for pt to call back in regards.  

## 2019-03-14 NOTE — Telephone Encounter (Signed)
Pts daughter aware of response and expressed understanding.  

## 2019-03-18 ENCOUNTER — Other Ambulatory Visit: Payer: Self-pay

## 2019-03-18 ENCOUNTER — Ambulatory Visit (HOSPITAL_COMMUNITY)
Admission: EM | Admit: 2019-03-18 | Discharge: 2019-03-18 | Disposition: A | Discharge status: Home / Self Care | Payer: Medicare Other | Attending: Emergency Medicine | Admitting: Emergency Medicine

## 2019-03-18 ENCOUNTER — Encounter (HOSPITAL_COMMUNITY): Payer: Self-pay

## 2019-03-18 DIAGNOSIS — Z20828 Contact with and (suspected) exposure to other viral communicable diseases: Secondary | ICD-10-CM | POA: Insufficient documentation

## 2019-03-18 DIAGNOSIS — J029 Acute pharyngitis, unspecified: Secondary | ICD-10-CM | POA: Insufficient documentation

## 2019-03-18 LAB — POCT RAPID STREP A: Streptococcus, Group A Screen (Direct): NEGATIVE

## 2019-03-18 NOTE — ED Provider Notes (Signed)
[facility]    CSN: [id number] Arrival date & time: 03/18/19  1420      History   Chief Complaint Chief Complaint  Patient presents with  . Sore Throat    HPI Judy [name] is a 85 y.o. female.   Patient presents with a one-month history of a sore throat.  She was seen on a virtual visit by her PCP and treated with amoxicillin on 02/27/2019; this antibiotic was changed to doxycycline on 03/03/2019 because her symptoms were not improving.  She denies fever, chills, difficulty swallowing, ear pain, cough, shortness of breath, abdominal pain, vomiting, diarrhea, rash, or other symptoms.  The history is provided by the patient.    Past Medical History:  Diagnosis Date  . Atrial fibrillation ([place])    a. chronic anticoag, failed prior DCCV;  b. 03/2010 Echo: EF 55-65%, mod MR, mildly to mod dil LA, mod dil RA, mild to mod MR.  Marland Kitchen CAD (coronary artery disease)    a. BMS to RCA 96';  b. Stent PDA and OM DES 2006  . CAROTID ARTERY DISEASE    a. 11/2011 Carotid U/S:  0-39% bilat dzs.  Marland Kitchen HTN (hypertension)   . Hyperlipidemia   . LBBB (left bundle branch block)   . Macular degeneration, wet ([place]) 10/2012 dx   L>R, follows with retinal specialist->injections monthly.  . Obesity   . Vertigo     Patient Active Problem List   Diagnosis Date Noted  . Acute pharyngitis 02/27/2019  . History of R cerebellar stroke 08/11/2018  . B12 deficiency 02/04/2018  . Allergic rhinitis 08/06/2017  . Diabetes ([place]) 09/27/2015  . Peripheral neuropathy 09/22/2015  . Encounter for therapeutic drug monitoring 07/25/2013  . Macular degeneration, wet ([place])   . LBBB (left bundle branch block) 03/21/2011  . Long term (current) use of anticoagulants 09/08/2010  . Carotid artery disease ([place]) 08/31/2009  . OBESITY 08/23/2009  . Mixed hyperlipidemia 12/16/2008  . Essential hypertension, benign 12/16/2008  . CAD, NATIVE VESSEL 12/16/2008  . ATRIAL FIBRILLATION 12/16/2008    Past Surgical  History:  Procedure Laterality Date  . [place], 2006 x2   Drug-eluting stent placement to the PDA, drug-eluting stent  placement to the first obtuse  marginal, StarClose closure of the right common femoral arteriotomy site. Successful drug-eluting stent  placement in  both the posterior descending artery and the obtuse marginal. The lesion was directly stented using a 2.5 x 16 mm Taxus deployed at 14 atmospheres.   Marland Kitchen EYE SURGERY     Tear duct stent    OB History   No obstetric history on file.      Home Medications    Prior to Admission medications   Medication Sig Start Date End Date Taking? Authorizing Provider  acetaminophen (TYLENOL) 500 MG tablet Take 500 mg by mouth every 6 (six) hours as needed.    [provider]  aspirin 81 MG tablet Take 81 mg by mouth daily.      [provider]  azelastine (ASTELIN) 0.1 % nasal spray Place 2 sprays into the nose 2 (two) times daily as needed for rhinitis or allergies.    [provider]  b complex vitamins tablet Take 1 tablet by mouth daily. 08/07/16   Binnie Rail, MD  diltiazem (CARDIZEM CD) 120 MG 24 hr capsule TAKE ONE CAPSULE BY MOUTH NIGHTLY AT BEDTIME 03/11/18   Josue Hector, MD  Fexofenadine HCl (ALLEGRA ALLERGY PO) Take 1  tablet by mouth daily.     [provider]  furosemide (LASIX) 20 MG tablet TAKE ONE TABLET BY MOUTH DAILY 01/02/19   Josue Hector, MD  isosorbide mononitrate (IMDUR) 30 MG 24 hr tablet TAKE 1 TABLET (30 MG TOTAL) BY MOUTH AT BEDTIME. 03/03/19   Josue Hector, MD  lisinopril (ZESTRIL) 10 MG tablet TAKE 1 TABLET BY MOUTH EVERY DAY 03/03/19   Josue Hector, MD  meclizine (ANTIVERT) 25 MG tablet Take 1 tablet (25 mg total) by mouth 3 (three) times daily as needed for dizziness. 04/09/18   Binnie Rail, MD  metoprolol succinate (TOPROL-XL) 100 MG 24 hr tablet TAKE 1 TABLET BY MOUTH EVERY DAY 02/16/19   Binnie Rail, MD  Multiple Vitamin (MULTIVITAMIN  WITH MINERALS) TABS tablet Take 1 tablet by mouth daily.    [provider]  NITROSTAT 0.4 MG SL tablet PLACE 1 TABLET UNDER TONGUE EVERY 5 MINUTES AS NEEDED FOR CHEST PAIN 08/04/15   Theora Gianotti, NP  potassium chloride (MICRO-K) 10 MEQ CR capsule TAKE 1 CAPSULE (10 MEQ TOTAL) BY MOUTH DAILY. 02/18/19   Josue Hector, MD  rosuvastatin (CRESTOR) 10 MG tablet Take 1 tablet (10 mg total) by mouth daily. 02/10/19   Josue Hector, MD  tobramycin (TOBREX) 0.3 % ophthalmic solution Place 1 drop into both eyes daily as needed.    [provider]  warfarin (COUMADIN) 1 MG tablet TAKE 1 TABLET BY MOUTH DAILY (TAKE IN ADDITION TO 2.5MG ) 06/14/17   Binnie Rail, MD  warfarin (COUMADIN) 2.5 MG tablet TAKE 1 TABLET DAILY OR AS DIRECTED BY ANTICOAGULATION CLINIC. 01/27/19   Binnie Rail, MD    Family History Family History  Problem Relation Age of Onset  . Arthritis Mother        died in her 49's  . Arthritis Father        died in [place].  Marland Kitchen CAD Brother        deceased  . CAD Brother        deceased  . CAD Sister        deceased    Social History Social History   Tobacco Use  . Smoking status: Never Smoker  . Smokeless tobacco: Never Used  Substance Use Topics  . Alcohol use: No  . Drug use: No     Allergies   Betadine [povidone iodine] and Methocarbamol   Review of Systems Review of Systems  Constitutional: Negative for chills and fever.  HENT: Positive for sore throat. Negative for congestion, ear pain, rhinorrhea and trouble swallowing.   Eyes: Negative for pain and visual disturbance.  Respiratory: Negative for cough and shortness of breath.   Cardiovascular: Negative for chest pain and palpitations.  Gastrointestinal: Negative for abdominal pain, diarrhea and vomiting.  Genitourinary: Negative for dysuria and hematuria.  Musculoskeletal: Negative for arthralgias and back pain.  Skin: Negative for color change and rash.  Neurological: Negative for  seizures and syncope.  All other systems reviewed and are negative.    Physical Exam Triage Vital Signs ED Triage Vitals  Enc Vitals Group     BP      Pulse      Resp      Temp      Temp src      SpO2      Weight      Height      Head Circumference      Peak Flow  Pain Score      Pain Loc      Pain Edu?      Excl. in [place]?    No data found.  Updated Vital Signs BP 97/67 (BP Location: Right Arm)   Pulse 82   Temp 98.4 F (36.9 C) (Oral)   Resp 18   SpO2 96%   Visual Acuity Right Eye Distance:   Left Eye Distance:   Bilateral Distance:    Right Eye Near:   Left Eye Near:    Bilateral Near:     Physical Exam Vitals signs and nursing note reviewed.  Constitutional:      General: She is not in acute distress.    Appearance: She is well-developed. She is not ill-appearing.  HENT:     Head: Normocephalic and atraumatic.     Mouth/Throat:     Mouth: Mucous membranes are moist.     Pharynx: Oropharynx is clear.  Eyes:     Conjunctiva/sclera: Conjunctivae normal.  Neck:     Musculoskeletal: Neck supple.  Cardiovascular:     Rate and Rhythm: Normal rate and regular rhythm.     Heart sounds: No murmur.  Pulmonary:     Effort: Pulmonary effort is normal. No respiratory distress.     Breath sounds: Normal breath sounds.  Abdominal:     Palpations: Abdomen is soft.     Tenderness: There is no abdominal tenderness. There is no guarding or rebound.  Skin:    General: Skin is warm and dry.     Findings: No rash.  Neurological:     Mental Status: She is alert.      UC Treatments / Results  Labs (all labs ordered are listed, but only abnormal results are displayed) Labs Reviewed  NOVEL CORONAVIRUS, NAA (HOSP ORDER, SEND-OUT TO REF LAB; TAT 18-24 HRS)  CULTURE, GROUP A STREP [facility])  POCT RAPID STREP A    EKG   Radiology No results found.  Procedures Procedures (including critical care time)  Medications Ordered in UC Medications - No data to  display  Initial Impression / Assessment and Plan / UC Course  I have reviewed the triage vital signs and the nursing notes.  Pertinent labs & imaging results that were available during my care of the patient were reviewed by me and considered in my medical decision making (see chart for details).    Sore throat.  Rapid strep test negative; throat culture pending.  COVID test performed here.  Instructed patient to self quarantine until her test results are back.  Instructed patient to go to the emergency department if she develops high fever, difficulty swallowing, shortness of breath, severe diarrhea, or other concerning symptoms.  Instructed patient to follow-up with her PCP tomorrow and suggested that she may consider consultation with an ENT if her sore throat continues.  Patient agrees with plan of care.      Final Clinical Impressions(s) / UC Diagnoses   Final diagnoses:  Sore throat     Discharge Instructions     Your rapid strep test is negative.  A throat culture is pending; we will call you if it is positive requiring treatment.    Your COVID test is pending.  You should self quarantine until your test result is back and is negative.    Go to the emergency department if you develop high fever, difficulty swallowing, shortness of breath, severe diarrhea, or other concerning symptoms.    Follow up with your primary care provider  tomorrow to discuss your ongoing sore throat.  Consider seeing an Ear, Nose, & Throat provider for further evaluation.         ED Prescriptions    None     PDMP not reviewed this encounter.   Sharion Balloon, NP 03/18/19 1515

## 2019-03-18 NOTE — Discharge Instructions (Addendum)
Your rapid strep test is negative.  A throat culture is pending; we will call you if it is positive requiring treatment.    Your COVID test is pending.  You should self quarantine until your test result is back and is negative.    Go to the emergency department if you develop high fever, difficulty swallowing, shortness of breath, severe diarrhea, or other concerning symptoms.    Follow up with your primary care provider tomorrow to discuss your ongoing sore throat.  Consider seeing an Ear, Nose, & Throat provider for further evaluation.

## 2019-03-18 NOTE — ED Triage Notes (Signed)
Pt presents to UC stating she was seen virtually for sore throat one month ago. Pt's PCP prescribed antibiotic 2 different times and pt continues to have sore throat. Pt denies fever.

## 2019-03-20 ENCOUNTER — Other Ambulatory Visit: Payer: Self-pay | Admitting: Cardiovascular Disease

## 2019-03-20 LAB — NOVEL CORONAVIRUS, NAA (HOSP ORDER, SEND-OUT TO REF LAB; TAT 18-24 HRS): SARS-CoV-2, NAA: NOT DETECTED

## 2019-03-21 ENCOUNTER — Ambulatory Visit: Payer: Self-pay | Admitting: *Deleted

## 2019-03-21 ENCOUNTER — Telehealth: Payer: Self-pay | Admitting: Internal Medicine

## 2019-03-21 ENCOUNTER — Ambulatory Visit: Payer: Medicare Other

## 2019-03-21 LAB — CULTURE, GROUP A STREP (THRC)

## 2019-03-21 NOTE — Telephone Encounter (Signed)
   Reason for Disposition . [1] Sore throat is the only symptom AND [2] present > 48 hours  Answer Assessment - Initial Assessment Questions 1. ONSET: "When did the throat start hurting?" (Hours or days ago)      About 1 month 2. SEVERITY: "How bad is the sore throat?" (Scale 1-10; mild, moderate or severe)   - MILD (1-3):  doesn't interfere with eating or normal activities   - MODERATE (4-7): interferes with eating some solids and normal activities   - SEVERE (8-10):  excruciating pain, interferes with most normal activities   - SEVERE DYSPHAGIA: can't swallow liquids, drooling     7 - Moderate, also has coughing when putting food in her mouth due to sore throat  3. STREP EXPOSURE: "Has there been any exposure to strep within the past week?" If so, ask: "What type of contact occurred?"      None 4.  VIRAL SYMPTOMS: "Are there any symptoms of a cold, such as a runny nose, cough, hoarse voice or red eyes?"      Denies all  5. FEVER: "Do you have a fever?" If so, ask: "What is your temperature, how was it measured, and when did it start?"     No fever  6. PUS ON THE TONSILS: "Is there pus on the tonsils in the back of your throat?"    There was pus on tonsils a few weeks ago, unsure if it is there now  7. OTHER SYMPTOMS: "Do you have any other symptoms?" (e.g., difficulty breathing, headache, rash)     Denies all  8. PREGNANCY: "Is there any chance you are pregnant?" "When was your last menstrual period?"     N/A  Protocols used: SORE THROAT-A-AH  Patient has had sore throat for about a month.  She has had a televisit with Dr. Quay Burow and was treated for pharyngitis with Amoxicillin.  She states the sore throat improved briefly, but then became sore again when when antibiotic was finished.  She went to [facility] on 03/18/2019 for evaluation of sore throat and had negative Strep and COVID tests.  Patient rates the discomfort at 7/10 on pain scale.  States she begins coughing whenever she  tries to eat something due to the sore throat.  She denies fever or any other symptoms.  Advised patient that given how long the sore throat has persisted and the treatments that she has tried have not helped- recommend that she see PCP within 3 days.  Patient scheduled for an appointment on 03/24/2019 at 10 am.

## 2019-03-21 NOTE — Telephone Encounter (Signed)
° °  COVID results given to daughter Angela Nevin

## 2019-03-23 NOTE — Progress Notes (Signed)
Subjective:    Patient ID: Judy [name], female    DOB: 07/25/1930, 85 y.o.   MRN: [id number]  HPI The patient is here for an acute visit.   He has had a sore throat for 1 month.  We did need visit 9/17.  She did test negative for COVID and subsequently negative for strep.  She completed amoxicillin x3 days without improvement, so I changed to doxycycline, which did not help.  She went to the ED 10/6 for her persistent sore throat.  She had no other symptoms.  Her exam in the ED was normal.  Rapid strep test was negative.  Throat culture was performed and was negative.  COVID test done in ED also negative.   She had soreness in the right posterior mouth - in the right tonsil area.  There was an area of white in that area.  Her daughter took pictures 1 month ago when her symptoms started and there is a white area in the right tonsil region.  She is concerned that it is related to Crestor, which she had started around that time.  She stopped taking it last night and switch back to the pravastatin.  When her symptoms first started she did have some postnasal drainage and a cough.  The cough was like a tickle in her throat and she also had it when she ate something or talk.  She still has some soreness in her throat, but it is better.  She has pain with swallowing at times.  Her cough is better.  After questioning more about other things that may have happened a month ago just before the pain started she did recall eating a chicken pot pie it was very hot and she thinks she burned that area in her mouth.  Medications and allergies reviewed with patient and updated if appropriate.  Patient Active Problem List   Diagnosis Date Noted  . Acute pharyngitis 02/27/2019  . History of R cerebellar stroke 08/11/2018  . B12 deficiency 02/04/2018  . Allergic rhinitis 08/06/2017  . Diabetes ([place]) 09/27/2015  . Peripheral neuropathy 09/22/2015  . Encounter for therapeutic drug monitoring 07/25/2013   . Macular degeneration, wet ([place])   . LBBB (left bundle branch block) 03/21/2011  . Long term (current) use of anticoagulants 09/08/2010  . Carotid artery disease ([place]) 08/31/2009  . OBESITY 08/23/2009  . Mixed hyperlipidemia 12/16/2008  . Essential hypertension, benign 12/16/2008  . CAD, NATIVE VESSEL 12/16/2008  . ATRIAL FIBRILLATION 12/16/2008    Current Outpatient Medications on File Prior to Visit  Medication Sig Dispense Refill  . acetaminophen (TYLENOL) 500 MG tablet Take 500 mg by mouth every 6 (six) hours as needed.    Marland Kitchen aspirin 81 MG tablet Take 81 mg by mouth daily.      Marland Kitchen azelastine (ASTELIN) 0.1 % nasal spray Place 2 sprays into the nose 2 (two) times daily as needed for rhinitis or allergies.    Marland Kitchen b complex vitamins tablet Take 1 tablet by mouth daily.    Marland Kitchen diltiazem (CARDIZEM CD) 120 MG 24 hr capsule TAKE ONE CAPSULE BY MOUTH NIGHTLY AT BEDTIME 90 capsule 3  . Fexofenadine HCl (ALLEGRA ALLERGY PO) Take 1 tablet by mouth daily.     . furosemide (LASIX) 20 MG tablet TAKE ONE TABLET BY MOUTH DAILY 90 tablet 0  . isosorbide mononitrate (IMDUR) 30 MG 24 hr tablet TAKE 1 TABLET (30 MG TOTAL) BY MOUTH AT BEDTIME. 90 tablet 3  . lisinopril (  ZESTRIL) 10 MG tablet TAKE 1 TABLET BY MOUTH EVERY DAY 90 tablet 3  . meclizine (ANTIVERT) 25 MG tablet Take 1 tablet (25 mg total) by mouth 3 (three) times daily as needed for dizziness. 30 tablet 0  . metoprolol succinate (TOPROL-XL) 100 MG 24 hr tablet TAKE 1 TABLET BY MOUTH EVERY DAY 90 tablet 1  . Multiple Vitamin (MULTIVITAMIN WITH MINERALS) TABS tablet Take 1 tablet by mouth daily.    Marland Kitchen NITROSTAT 0.4 MG SL tablet PLACE 1 TABLET UNDER TONGUE EVERY 5 MINUTES AS NEEDED FOR CHEST PAIN 25 tablet 8  . potassium chloride (MICRO-K) 10 MEQ CR capsule TAKE 1 CAPSULE (10 MEQ TOTAL) BY MOUTH DAILY. 90 capsule 3  . rosuvastatin (CRESTOR) 10 MG tablet Take 1 tablet (10 mg total) by mouth daily. 90 tablet 1  . tobramycin (TOBREX) 0.3 % ophthalmic  solution Place 1 drop into both eyes daily as needed.    . warfarin (COUMADIN) 1 MG tablet TAKE 1 TABLET BY MOUTH DAILY (TAKE IN ADDITION TO 2.5MG ) 30 tablet 0  . warfarin (COUMADIN) 2.5 MG tablet TAKE 1 TABLET DAILY OR AS DIRECTED BY ANTICOAGULATION CLINIC. 90 tablet 1   No current facility-administered medications on file prior to visit.     Past Medical History:  Diagnosis Date  . Atrial fibrillation ([place])    a. chronic anticoag, failed prior DCCV;  b. 03/2010 Echo: EF 55-65%, mod MR, mildly to mod dil LA, mod dil RA, mild to mod MR.  Marland Kitchen CAD (coronary artery disease)    a. BMS to RCA 96';  b. Stent PDA and OM DES 2006  . CAROTID ARTERY DISEASE    a. 11/2011 Carotid U/S:  0-39% bilat dzs.  Marland Kitchen HTN (hypertension)   . Hyperlipidemia   . LBBB (left bundle branch block)   . Macular degeneration, wet ([place]) 10/2012 dx   L>R, follows with retinal specialist->injections monthly.  . Obesity   . Vertigo     Past Surgical History:  Procedure Laterality Date  . [place], 2006 x2   Drug-eluting stent placement to the PDA, drug-eluting stent  placement to the first obtuse  marginal, StarClose closure of the right common femoral arteriotomy site. Successful drug-eluting stent  placement in  both the posterior descending artery and the obtuse marginal. The lesion was directly stented using a 2.5 x 16 mm Taxus deployed at 14 atmospheres.   Marland Kitchen EYE SURGERY     Tear duct stent    Social History   Socioeconomic History  . Marital status: Widowed    Spouse name: Not on file  . Number of children: Not on file  . Years of education: Not on file  . Highest education level: Not on file  Occupational History  . Not on file  Social Needs  . Financial resource strain: Not on file  . Food insecurity    Worry: Not on file    Inability: Not on file  . Transportation needs    Medical: Not on file    Non-medical: Not on file  Tobacco Use  . Smoking status: Never Smoker  .  Smokeless tobacco: Never Used  Substance and Sexual Activity  . Alcohol use: No  . Drug use: No  . Sexual activity: Not on file  Lifestyle  . Physical activity    Days per week: Not on file    Minutes per session: Not on file  . Stress: Not on file  Relationships  . Social connections  Talks on phone: Not on file    Gets together: Not on file    Attends religious service: Not on file    Active member of club or organization: Not on file    Attends meetings of clubs or organizations: Not on file    Relationship status: Not on file  Other Topics Concern  . Not on file  Social History Narrative   Lives in [place] by herself.  She is active around the house w/o symptoms or limitations.    Family History  Problem Relation Age of Onset  . Arthritis Mother        died in her 13's  . Arthritis Father        died in [place].  Marland Kitchen CAD Brother        deceased  . CAD Brother        deceased  . CAD Sister        deceased    Review of Systems  Constitutional: Negative for chills and fever.  HENT: Positive for postnasal drip (clear) and sore throat. Negative for congestion, ear pain, sinus pressure, sinus pain and trouble swallowing.   Respiratory: Positive for cough (tickle in the throat, when ate, when she talked - better but not gone). Negative for shortness of breath and wheezing.   Neurological: Negative for headaches.       Objective:   Vitals:   03/24/19 0950  BP: 130/62  Pulse: 98  Resp: 16  Temp: 98.4 F (36.9 C)  SpO2: 99%   BP Readings from Last 3 Encounters:  03/24/19 130/62  03/18/19 97/67  02/12/19 138/72   Wt Readings from Last 3 Encounters:  03/24/19 180 lb (81.6 kg)  02/12/19 190 lb (86.2 kg)  01/20/19 196 lb (88.9 kg)   Body mass index is 31.89 kg/m.   Physical Exam    GENERAL APPEARANCE: Appears stated age, well appearing, NAD EYES: conjunctiva clear, no icterus HEENT: bilateral tympanic membranes and ear canals normal, oropharynx with no  erythema, no ulcer or exudate in mouth.  No thyromegaly, trachea midline, no cervical or supraclavicular lymphadenopathy LUNGS: Clear to auscultation without wheeze or crackles, unlabored breathing, good air entry bilaterally CARDIOVASCULAR: Normal S1,S2 without murmurs, no edema SKIN: Warm, dry      Assessment & Plan:    See Problem List for Assessment and Plan of chronic medical problems.

## 2019-03-24 ENCOUNTER — Ambulatory Visit (INDEPENDENT_AMBULATORY_CARE_PROVIDER_SITE_OTHER): Payer: Medicare Other | Admitting: Internal Medicine

## 2019-03-24 ENCOUNTER — Encounter: Payer: Self-pay | Admitting: Internal Medicine

## 2019-03-24 ENCOUNTER — Other Ambulatory Visit: Payer: Self-pay

## 2019-03-24 ENCOUNTER — Ambulatory Visit (INDEPENDENT_AMBULATORY_CARE_PROVIDER_SITE_OTHER): Payer: Medicare Other | Admitting: General Practice

## 2019-03-24 DIAGNOSIS — J029 Acute pharyngitis, unspecified: Secondary | ICD-10-CM | POA: Diagnosis not present

## 2019-03-24 DIAGNOSIS — I6523 Occlusion and stenosis of bilateral carotid arteries: Secondary | ICD-10-CM | POA: Diagnosis not present

## 2019-03-24 DIAGNOSIS — Z7901 Long term (current) use of anticoagulants: Secondary | ICD-10-CM | POA: Diagnosis not present

## 2019-03-24 LAB — POCT INR: INR: 2.1 (ref 2.0–3.0)

## 2019-03-24 NOTE — Patient Instructions (Addendum)
Continue symptomatic treatment for your sore throat.  This should resolve completely, if it does not let me know.

## 2019-03-24 NOTE — Assessment & Plan Note (Signed)
Exam is normal now and her sore throat is improving Upon further questioning she had soreness in 1 specific area of her mouth which seems to be related to burning her mouth after eating something very hot Reviewing the pictures her daughter had is consistent with this-the area looked like there was an ulcer there Stressed that I do not think this is medication related Continue symptomatic treatment-I would expect her symptoms to completely resolve with a little more time, but advised her to call and let me know if they do not

## 2019-03-24 NOTE — Patient Instructions (Incomplete)
Pre visit review using our clinic review tool, if applicable. No additional management support is needed unless otherwise documented below in the visit note.   Continue to take 1 tablet daily except nothing on Fridays.  Re-check in 6 weeks.  

## 2019-03-28 ENCOUNTER — Ambulatory Visit: Payer: Medicare Other

## 2019-03-31 ENCOUNTER — Other Ambulatory Visit: Payer: Self-pay | Admitting: Cardiovascular Disease

## 2019-04-10 DIAGNOSIS — J029 Acute pharyngitis, unspecified: Secondary | ICD-10-CM | POA: Diagnosis not present

## 2019-04-10 DIAGNOSIS — J039 Acute tonsillitis, unspecified: Secondary | ICD-10-CM | POA: Diagnosis not present

## 2019-04-10 DIAGNOSIS — R0982 Postnasal drip: Secondary | ICD-10-CM | POA: Diagnosis not present

## 2019-05-02 ENCOUNTER — Ambulatory Visit (INDEPENDENT_AMBULATORY_CARE_PROVIDER_SITE_OTHER): Payer: Medicare Other | Admitting: General Practice

## 2019-05-02 ENCOUNTER — Other Ambulatory Visit: Payer: Self-pay

## 2019-05-02 DIAGNOSIS — Z7901 Long term (current) use of anticoagulants: Secondary | ICD-10-CM | POA: Diagnosis not present

## 2019-05-02 LAB — POCT INR: INR: 2.3 (ref 2.0–3.0)

## 2019-05-02 NOTE — Patient Instructions (Signed)
Pre visit review using our clinic review tool, if applicable. No additional management support is needed unless otherwise documented below in the visit note.   Continue to take 1 tablet daily except nothing on Fridays.  Re-check in 6 weeks.  

## 2019-05-02 NOTE — Progress Notes (Signed)
Medical screening examination/treatment/procedure(s) were performed by non-physician practitioner and as supervising physician I was immediately available for consultation/collaboration. I agree with above. James John, MD   

## 2019-05-12 ENCOUNTER — Other Ambulatory Visit: Payer: Medicare Other | Admitting: *Deleted

## 2019-05-12 ENCOUNTER — Other Ambulatory Visit: Payer: Self-pay

## 2019-05-12 ENCOUNTER — Encounter (INDEPENDENT_AMBULATORY_CARE_PROVIDER_SITE_OTHER): Payer: Self-pay

## 2019-05-12 DIAGNOSIS — Z5181 Encounter for therapeutic drug level monitoring: Secondary | ICD-10-CM

## 2019-05-12 DIAGNOSIS — I2510 Atherosclerotic heart disease of native coronary artery without angina pectoris: Secondary | ICD-10-CM | POA: Diagnosis not present

## 2019-05-12 DIAGNOSIS — E782 Mixed hyperlipidemia: Secondary | ICD-10-CM

## 2019-05-12 LAB — LIPID PANEL
Chol/HDL Ratio: 3.8 ratio (ref 0.0–4.4)
Cholesterol, Total: 178 mg/dL (ref 100–199)
HDL: 47 mg/dL (ref >39)
LDL Chol Calc (NIH): 84 mg/dL (ref 0–99)
Triglycerides: 287 mg/dL — ABNORMAL HIGH (ref 0–149)
VLDL Cholesterol Cal: 47 mg/dL — ABNORMAL HIGH (ref 5–40)

## 2019-05-12 LAB — HEPATIC FUNCTION PANEL
ALT: 14 IU/L (ref 0–32)
AST: 21 IU/L (ref 0–40)
Albumin: 4.2 g/dL (ref 3.6–4.6)
Alkaline Phosphatase: 67 IU/L (ref 39–117)
Bilirubin Total: 0.4 mg/dL (ref 0.0–1.2)
Bilirubin, Direct: 0.13 mg/dL (ref 0.00–0.40)
Total Protein: 6.3 g/dL (ref 6.0–8.5)

## 2019-05-20 DIAGNOSIS — H43813 Vitreous degeneration, bilateral: Secondary | ICD-10-CM | POA: Diagnosis not present

## 2019-05-20 DIAGNOSIS — H353231 Exudative age-related macular degeneration, bilateral, with active choroidal neovascularization: Secondary | ICD-10-CM | POA: Diagnosis not present

## 2019-05-20 DIAGNOSIS — H43393 Other vitreous opacities, bilateral: Secondary | ICD-10-CM | POA: Diagnosis not present

## 2019-05-20 DIAGNOSIS — H35372 Puckering of macula, left eye: Secondary | ICD-10-CM | POA: Diagnosis not present

## 2019-05-22 ENCOUNTER — Telehealth: Payer: Self-pay | Admitting: Internal Medicine

## 2019-05-22 MED ORDER — MECLIZINE HCL 25 MG PO TABS: 25.0000 mg | ORAL_TABLET | Freq: Three times a day (TID) | ORAL | 5 refills | Status: DC | PRN

## 2019-05-22 NOTE — Telephone Encounter (Signed)
meclizine (ANTIVERT) 25 MG tablet  Pt states that she takes these occasionally for dizzy spells and has had a few lately and if she takes these they go away. She wants a refill. States she does not do the virtual appt well and this is an ongoing problem that comes and goes and she just needs a refill. She is doing fine now as she has been taking these. If it cannot be refilled pls call86 and covid concerns [facility] [id number], [place] Phone:  [phone]  Fax:  [phone]     .

## 2019-05-22 NOTE — Telephone Encounter (Signed)
rx sent to pharmacy

## 2019-05-30 ENCOUNTER — Ambulatory Visit (INDEPENDENT_AMBULATORY_CARE_PROVIDER_SITE_OTHER): Payer: Medicare Other | Admitting: General Practice

## 2019-05-30 ENCOUNTER — Other Ambulatory Visit: Payer: Self-pay

## 2019-05-30 DIAGNOSIS — I4891 Unspecified atrial fibrillation: Secondary | ICD-10-CM

## 2019-05-30 DIAGNOSIS — Z7901 Long term (current) use of anticoagulants: Secondary | ICD-10-CM

## 2019-05-30 LAB — POCT INR: INR: 1.5 — AB (ref 2.0–3.0)

## 2019-05-30 NOTE — Patient Instructions (Addendum)
Pre visit review using our clinic review tool, if applicable. No additional management support is needed unless otherwise documented below in the visit note.  Take 1 tablet today (12/18) and then continue to take 1 tablet daily except nothing on Fridays.  Re-check in 4 weeks.

## 2019-05-30 NOTE — Progress Notes (Signed)
Agree with management.  Elica Almas J Windy Dudek, MD  

## 2019-06-20 ENCOUNTER — Ambulatory Visit: Payer: Medicare Other

## 2019-06-27 ENCOUNTER — Other Ambulatory Visit: Payer: Self-pay

## 2019-06-27 ENCOUNTER — Ambulatory Visit (INDEPENDENT_AMBULATORY_CARE_PROVIDER_SITE_OTHER): Payer: Medicare Other | Admitting: General Practice

## 2019-06-27 DIAGNOSIS — Z7901 Long term (current) use of anticoagulants: Secondary | ICD-10-CM

## 2019-06-27 DIAGNOSIS — I4891 Unspecified atrial fibrillation: Secondary | ICD-10-CM | POA: Diagnosis not present

## 2019-06-27 LAB — POCT INR: INR: 1.7 — AB (ref 2.0–3.0)

## 2019-06-27 NOTE — Patient Instructions (Signed)
.  lbpcmh  Take 1 tablet today (1/15) and then change dosage and take 1 (2.5 mg) tablet daily except take 1 (1 mg) tablet on Fridays.

## 2019-06-28 NOTE — Progress Notes (Signed)
Agree with management.  Deshay Kirstein J Yoniel Arkwright, MD  

## 2019-07-23 ENCOUNTER — Other Ambulatory Visit: Payer: Self-pay | Admitting: General Practice

## 2019-07-23 ENCOUNTER — Other Ambulatory Visit: Payer: Self-pay

## 2019-07-23 DIAGNOSIS — Z7901 Long term (current) use of anticoagulants: Secondary | ICD-10-CM

## 2019-07-23 MED ORDER — WARFARIN SODIUM 2.5 MG PO TABS: ORAL_TABLET | ORAL | 1 refills | Status: DC

## 2019-07-25 ENCOUNTER — Telehealth: Payer: Self-pay | Admitting: Internal Medicine

## 2019-07-25 DIAGNOSIS — I10 Essential (primary) hypertension: Secondary | ICD-10-CM

## 2019-07-25 NOTE — Chronic Care Management (AMB) (Signed)
  Chronic Care Management   Note  07/25/2019 Name: Judy [name] MRN: [id number] DOB: 22-Nov-1930  Judy [name] is a 86 y.o. year old female who is a primary care patient of Burns, Claudina Lick, MD. I reached out to Charlette Caffey by phone today in response to a referral sent by Judy [name] PCP, Binnie Rail, MD.   Judy [name]:  1. CCM service includes personalized support from designated clinical staff supervised by her physician, [name] individualized plan of care and coordination with other care providers 2. 24/7 contact phone numbers for assistance for urgent and routine care needs. 3. Service will only be billed when office clinical staff spend 20 minutes or more in a month to coordinate care. 4. Only one practitioner may furnish and bill the service in a calendar month. 5. The patient may stop CCM services at any time (effective at the end of the month) by phone call to the office staff. 6. The patient will be responsible for cost sharing (co-pay) of up to 20% of the service fee (after annual deductible is met).  Patient agreed to services and verbal consent obtained.   Follow up plan:   Raynicia Dukes [employer] Scheduler

## 2019-07-29 ENCOUNTER — Ambulatory Visit (INDEPENDENT_AMBULATORY_CARE_PROVIDER_SITE_OTHER): Payer: Medicare Other | Admitting: General Practice

## 2019-07-29 ENCOUNTER — Other Ambulatory Visit: Payer: Self-pay

## 2019-07-29 DIAGNOSIS — Z7901 Long term (current) use of anticoagulants: Secondary | ICD-10-CM | POA: Diagnosis not present

## 2019-07-29 DIAGNOSIS — I4891 Unspecified atrial fibrillation: Secondary | ICD-10-CM

## 2019-07-29 LAB — POCT INR: INR: 1.7 — AB (ref 2.0–3.0)

## 2019-07-29 NOTE — Addendum Note (Signed)
Addended by: Aviva Signs M on: 07/29/2019 05:30 AM   Modules accepted: Orders

## 2019-07-29 NOTE — Patient Instructions (Addendum)
Pre visit review using our clinic review tool, if applicable. No additional management support is needed unless otherwise documented below in the visit note.  Take 2 tablets today (5 mg) and then change dosage and take 1 (2.5 mg) tablet daily except take 2 (1 mg tablets ) on Fridays.

## 2019-07-29 NOTE — Progress Notes (Signed)
Agree with management.  Damien Batty J Dinh Ayotte, MD  

## 2019-07-30 ENCOUNTER — Ambulatory Visit: Payer: Medicare Other | Admitting: Pharmacist

## 2019-07-30 DIAGNOSIS — I10 Essential (primary) hypertension: Secondary | ICD-10-CM

## 2019-07-30 DIAGNOSIS — E119 Type 2 diabetes mellitus without complications: Secondary | ICD-10-CM

## 2019-07-30 DIAGNOSIS — J3089 Other allergic rhinitis: Secondary | ICD-10-CM

## 2019-07-30 DIAGNOSIS — I4891 Unspecified atrial fibrillation: Secondary | ICD-10-CM

## 2019-07-30 DIAGNOSIS — E782 Mixed hyperlipidemia: Secondary | ICD-10-CM

## 2019-07-30 DIAGNOSIS — I2510 Atherosclerotic heart disease of native coronary artery without angina pectoris: Secondary | ICD-10-CM

## 2019-07-30 NOTE — Patient Instructions (Signed)
Visit Information  Thank you for meeting with me to discuss your medications! I look forward to working with you to achieve your health care goals. Below is a summary of what we talked about during the visit:  Goals Addressed            This Visit's Progress   . Pharmacy Care Plan       Current Barriers:  . Chronic Disease Management support, education, and care coordination needs related to Atrial Fibrillation, CAD, HTN, HLD, and prediabetes  Pharmacist Clinical Goal(s):  Marland Kitchen Maintain blood pressure between 100/60 and 140/90 . Maintain heart rate between 60 and 100 . Maintain LDL (bad cholesterol) less than 100 (currently 84) . Maintain INR between 2 and 2.5   Interventions: . Comprehensive medication review performed. . Discussed possibility of switching warfarin to Eliquis or Xarelto for less blood monitoring, no food/drug interactions, and lower risk of bleeding . Discuss need to continue furosemide with Dr Johnsie Cancel . Discussed how metoprolol and diltiazem affect blood pressure and heart rate . Get a new bottle of Nitrostat every 6-12 months  . Check blood pressure when feeling dizzy. If top number is less than 100 contact me or Dr Quay Burow  Patient Self Care Activities:  . Self administers medications as prescribed, Calls pharmacy for medication refills, and Calls provider office for new concerns or questions  Initial goal documentation       Ms. Tuft was given information about Chronic Care Management services today including:  1. CCM service includes personalized support from designated clinical staff supervised by her physician, including individualized plan of care and coordination with other care providers 2. 24/7 contact phone numbers for assistance for urgent and routine care needs. 3. Service will only be billed when office clinical staff spend 20 minutes or more in a month to coordinate care. 4. Only one practitioner may furnish and bill the service in a calendar  month. 5. The patient may stop CCM services at any time (effective at the end of the month) by phone call to the office staff. 6. The patient will be responsible for cost sharing (co-pay) of up to 20% of the service fee (after annual deductible is met).  Patient agreed to services and verbal consent obtained.   Print copy of patient instructions provided.  Telephone follow up appointment with pharmacy team member scheduled for: 10/03/19 @ 11:30am   Charlene Brooke, PharmD Clinical Pharmacist [facility] [phone]    Dizziness Dizziness is a common problem. It makes you feel unsteady or light-headed. You may feel like you are about to pass out (faint). Dizziness can lead to getting hurt if you stumble or fall. Dizziness can be caused by many things, including:  Medicines.  Not having enough water in your body (dehydration).  Illness. Follow these instructions at home: Eating and drinking   Drink enough fluid to keep your pee (urine) clear or pale yellow. This helps to keep you from getting dehydrated. Try to drink more clear fluids, such as water.  Do not drink alcohol.  Limit how much caffeine you drink or eat, if your doctor tells you to do that.  Limit how much salt (sodium) you drink or eat, if your doctor tells you to do that. Activity   Avoid making quick movements. ? When you stand up from sitting in a chair, steady yourself until you feel okay. ? In the morning, first sit up on the side of the bed. When you feel  okay, stand slowly while you hold onto something. Do this until you know that your balance is fine.  If you need to stand in one place for a long time, move your legs often. Tighten and relax the muscles in your legs while you are standing.  Do not drive or use heavy machinery if you feel dizzy.  Avoid bending down if you feel dizzy. Place items in your home so you can reach them easily without leaning over. Lifestyle  Do  not use any products that contain nicotine or tobacco, such as cigarettes and e-cigarettes. If you need help quitting, ask your doctor.  Try to lower your stress level. You can do this by using methods such as yoga or meditation. Talk with your doctor if you need help. General instructions  Watch your dizziness for any changes.  Take over-the-counter and prescription medicines only as told by your doctor. Talk with your doctor if you think that you are dizzy because of a medicine that you are taking.  Tell a friend or a family member that you are feeling dizzy. If he or she notices any changes in your behavior, have this person call your doctor.  Keep all follow-up visits as told by your doctor. This is important. Contact a doctor if:  Your dizziness does not go away.  Your dizziness or light-headedness gets worse.  You feel sick to your stomach (nauseous).  You have trouble hearing.  You have new symptoms.  You are unsteady on your feet.  You feel like the room is spinning. Get help right away if:  You throw up (vomit) or have watery poop (diarrhea), and you cannot eat or drink anything.  You have trouble: ? Talking. ? Walking. ? Swallowing. ? Using your arms, hands, or legs.  You feel generally weak.  You are not thinking clearly, or you have trouble forming sentences. A friend or family member may notice this.  You have: ? Chest pain. ? Pain in your belly (abdomen). ? Shortness of breath. ? Sweating.  Your vision changes.  You are bleeding.  You have a very bad headache.  You have neck pain or a stiff neck.  You have a fever. These symptoms may be an emergency. Do not wait to see if the symptoms will go away. Get medical help right away. Call your local emergency services (911 in the [place] Do not drive yourself to the hospital. Summary  Dizziness makes you feel unsteady or light-headed. You may feel like you are about to pass out (faint).  Drink enough  fluid to keep your pee (urine) clear or pale yellow. Do not drink alcohol.  Avoid making quick movements if you feel dizzy.  Watch your dizziness for any changes. This information is not intended to replace advice given to you by your health care provider. Make sure you discuss any questions you have with your health care provider. Document Revised: 06/01/2017 Document Reviewed: 06/15/2016 Elsevier Patient Education  [place].

## 2019-07-30 NOTE — Progress Notes (Signed)
  I have reviewed this encounter including the documentation in this note and/or discussed this patient with the care management provider. I am certifying that I agree with the content of this note as supervising physician.  Rolin Schult J Rayette Mogg, MD   07/30/2019  

## 2019-07-30 NOTE — Chronic Care Management (AMB) (Signed)
Chronic Care Management Pharmacy  Name: Judy [name]  MRN: [id number] DOB: [birth year]   Chief Complaint/ HPI   Judy [name],  86 y.o. , female presents for their Initial CCM visit with the clinical pharmacist via telephone.  PCP : Binnie Rail, MD  Their chronic conditions include: HTN, AFIB, CAD, DM, HLD, allergies  Office Visits: 03/24/19 Dr Quay Burow OV: sore throat, likely due to burning her mouth on hot food. Rec'd symptomatic treatment.  02/27/19 Dr Quay Burow VV: sore throat, empirically treated for strep with amoxicillin, switched to doxycyline a week later.  02/12/19 Dr Quay Burow OV:   Consult Visit: 05/20/19 Dr Baird Cancer (ophthalmology): claims data  03/18/19 ED visit: sore throat x 1 month. Strep negative, COVID negative.  02/10/19 Dr Johnsie Cancel (cardiology): changed pravastatin to simvastatin [facility] issues [facility], however simvastatin interacts with diltiazem so changed to rosuvastatin 10 mg.   01/20/19 Dr Johnsie Cancel (cardiology): no med changes   Medications: Outpatient Encounter Medications as of 07/30/2019  Medication Sig  . acetaminophen (TYLENOL) 500 MG tablet Take 500 mg by mouth every 6 (six) hours as needed.  Marland Kitchen aspirin 81 MG tablet Take 81 mg by mouth daily.    Marland Kitchen b complex vitamins tablet Take 1 tablet by mouth daily.  Marland Kitchen diltiazem (CARDIZEM CD) 120 MG 24 hr capsule TAKE ONE CAPSULE BY MOUTH NIGHTLY AT BEDTIME  . Fexofenadine HCl (ALLEGRA ALLERGY PO) Take 1 tablet by mouth daily.   . furosemide (LASIX) 20 MG tablet TAKE ONE TABLET BY MOUTH DAILY  . isosorbide mononitrate (IMDUR) 30 MG 24 hr tablet TAKE 1 TABLET (30 MG TOTAL) BY MOUTH AT BEDTIME.  Marland Kitchen lisinopril (ZESTRIL) 10 MG tablet TAKE 1 TABLET BY MOUTH EVERY DAY  . meclizine (ANTIVERT) 25 MG tablet Take 1 tablet (25 mg total) by mouth 3 (three) times daily as needed for dizziness.  . metoprolol succinate (TOPROL-XL) 100 MG 24 hr tablet TAKE 1 TABLET BY MOUTH EVERY DAY  . Multiple Vitamin (MULTIVITAMIN WITH MINERALS)  TABS tablet Take 1 tablet by mouth daily.  Marland Kitchen NITROSTAT 0.4 MG SL tablet PLACE 1 TABLET UNDER TONGUE EVERY 5 MINUTES AS NEEDED FOR CHEST PAIN  . potassium chloride (MICRO-K) 10 MEQ CR capsule TAKE 1 CAPSULE (10 MEQ TOTAL) BY MOUTH DAILY.  . rosuvastatin (CRESTOR) 10 MG tablet Take 1 tablet (10 mg total) by mouth daily.  . sodium chloride (OCEAN) 0.65 % SOLN nasal spray Place 1 spray into both nostrils as needed for congestion.  Marland Kitchen warfarin (COUMADIN) 1 MG tablet TAKE 1 TABLET BY MOUTH DAILY (TAKE IN ADDITION TO 2.5MG )  . warfarin (COUMADIN) 2.5 MG tablet Take 1 tablet daily or take as directed by anticoagulation clinic  . azelastine (ASTELIN) 0.1 % nasal spray Place 2 sprays into the nose 2 (two) times daily as needed for rhinitis or allergies.  Marland Kitchen tobramycin (TOBREX) 0.3 % ophthalmic solution Place 1 drop into both eyes daily as needed.   No facility-administered encounter medications on file as of 07/30/2019.     Current Diagnosis/Assessment:  Goals Addressed            This Visit's Progress   . Pharmacy Care Plan       Current Barriers:  . Chronic Disease Management support, education, and care coordination needs related to Atrial Fibrillation, CAD, HTN, HLD, and prediabetes  Pharmacist Clinical Goal(s):  Marland Kitchen Maintain blood pressure between 100/60 and 140/90 . Maintain heart rate between 60 and 100 . Maintain LDL (bad cholesterol) less  than 100 (currently 78) . Maintain INR between 2 and 2.5  Interventions: . Comprehensive medication review performed. . Discussed possibility of switching warfarin to Eliquis or Xarelto for less blood monitoring, no food/drug interactions, and lower risk of bleeding . Discuss need to continue furosemide with Dr Johnsie Cancel . Discussed how metoprolol and diltiazem affect blood pressure and heart rate . Get a new bottle of Nitrostat every 6-12 months   Patient Self Care Activities:  . Self administers medications as prescribed, Calls pharmacy for  medication refills, and Calls provider office for new concerns or questions  Initial goal documentation       Afib/CAD/Hypertension   Patient is currently rate controlled.  Office blood pressures are  BP Readings from Last 3 Encounters:  03/24/19 130/62  03/18/19 97/67  02/12/19 138/72    Patient checks BP at home infrequently  Patient home BP readings are ranging: "normal" however pt cannot remember specific numbers  Patient has failed these meds in past: n/a  Patient is currently controlled on the following medications: diltiazem 120 mg daily HS, metoprolol succinate 100 mg daily HS, lisinopril 10 mg daily HS, furosemide 20 mg daily, potassium cl 10 meq daily, isosorbide MN 30 mg daily HS, warfarin as directed, nitroglycerin 0.4 mg SL prn, aspirin 81 mg daily  We discussed: BP and HR goals, metoprolol and diltiazem affect HR and BP, s/sx of low heart rate and low BP. Pt does have dizzy spells that she attributes to an "ear problem" but she does not know what BP is during those times.   Also discussed warfarin, INR has been < 2 last 3 times, pt attribute this to recent illness. Discussed possibility of switching to DOAC, pt wants to stay on warfarin for cost and will think about DOAC when they are generic.  Discussed nitroglycerin use, pt has not had to use in many years, she still keeps in purse. She has not refilled in over a year, discussed expiration of NTG and need to refill every 6-12 months.  Discussed furosemide, pt has mild swelling in her left leg, reports she was put on furosemide after she was hospitalized with edema around her lungs. She asked if she should still take it daily, pt has f/u with cardiologist in 2 months and will discuss then.  Plan  Continue current medications and control with diet and exercise  Refill nitroglycerin to pick up fresh bottle Consider switch to DOAC when generic available Discuss continued furosemide use with Dr  Johnsie Cancel  Hyperlipidemia   Lipid Panel     Component Value Date/Time   CHOL 178 05/12/2019 0925   TRIG 287 (H) 05/12/2019 0925   HDL 47 05/12/2019 0925   CHOLHDL 3.8 05/12/2019 0925   CHOLHDL 4 08/12/2018 1052   VLDL 55.6 (H) 08/12/2018 1052   LDLCALC 84 05/12/2019 0925   LDLDIRECT 125.0 08/12/2018 1052   LABVLDL 47 (H) 05/12/2019 0925     Patient has failed these meds in past: pravastatin (cost) Patient is currently controlled on the following medications: rosuvastatin 10 mg daily  We discussed: cholesterol goals, importance of statin for secondary prevention. Pt is mindful of her diet and avoids fried foods.  Plan  Continue current medications and control with diet and exercise   Diabetes   Recent Relevant Labs: Lab Results  Component Value Date/Time   HGBA1C 6.4 (A) 02/12/2019 10:49 AM   HGBA1C 6.2 08/12/2018 10:52 AM   HGBA1C 6.3 02/04/2018 11:04 AM    No medication indicated.  We  discussed: A1c is in prediabetic range, pt has never taken meds for DM. Continuing to eat well and exercise will help prevent progression to DM.  Plan  Continue control with diet and exercise   Allergies   Patient is currently controlled on the following medications: Allegra, saline spray  We discussed:  Allergies are worse in the fall, she controls her symptoms well with OTC meds.  Plan  Continue current medications  Health Maintenance   Patient is currently controlled on the following medications: Vitamin B complex, multivitamin, probiotic daily, metamucil, meclizine 25 mg TID prn dizziness   Plan  Continue current medications    Medication Management   [facility] Uses pill box which she sets up herself Pt endorses 100% compliance  We discussed:  Benefits of med synchronization, pill packaging and delivery. Pt wishes to continue dispensing with her current pharmacy.  Plan  Continue medication management strategy   Follow up: 3  month phone visit  Charlene Brooke, PharmD Clinical Pharmacist [facility] [phone]

## 2019-08-06 ENCOUNTER — Other Ambulatory Visit: Payer: Self-pay | Admitting: Cardiovascular Disease

## 2019-08-12 DIAGNOSIS — H353231 Exudative age-related macular degeneration, bilateral, with active choroidal neovascularization: Secondary | ICD-10-CM | POA: Diagnosis not present

## 2019-08-12 DIAGNOSIS — H43393 Other vitreous opacities, bilateral: Secondary | ICD-10-CM | POA: Diagnosis not present

## 2019-08-12 DIAGNOSIS — H35372 Puckering of macula, left eye: Secondary | ICD-10-CM | POA: Diagnosis not present

## 2019-08-12 DIAGNOSIS — H43813 Vitreous degeneration, bilateral: Secondary | ICD-10-CM | POA: Diagnosis not present

## 2019-08-14 NOTE — Patient Instructions (Addendum)
  Blood work was ordered.     Medications reviewed and updated.  Changes include :   none  Your prescription(s) have been submitted to your pharmacy. Please take as directed and contact our office if you believe you are having problem(s) with the medication(s).   Please followup in 6 months   

## 2019-08-14 NOTE — Progress Notes (Signed)
Subjective:    Patient ID: Judy [name], female    DOB: [birth year], 86 y.o.   MRN: [id number]  HPI The patient is here for follow up of their chronic medical problems, including diabetes, Afib, hypertension, hyperlipidemia, h/o CVA, neuropathy.  She is taking all of her medications as prescribed.   She is not exercising regularly.   Has b/l knee pain.   She is eating well.  She is eating less and has lost weight.   She had vertigo in December.  She took the meclizine.    Medications and allergies reviewed with patient and updated if appropriate.  Patient Active Problem List   Diagnosis Date Noted  . History of R cerebellar stroke 08/11/2018  . B12 deficiency 02/04/2018  . Allergic rhinitis 08/06/2017  . Diabetes ([place]) 09/27/2015  . Peripheral neuropathy 09/22/2015  . Encounter for therapeutic drug monitoring 07/25/2013  . Macular degeneration, wet ([place])   . LBBB (left bundle branch block) 03/21/2011  . Long term (current) use of anticoagulants 09/08/2010  . Carotid artery disease ([place]) 08/31/2009  . OBESITY 08/23/2009  . Mixed hyperlipidemia 12/16/2008  . Essential hypertension, benign 12/16/2008  . CAD, NATIVE VESSEL 12/16/2008  . ATRIAL FIBRILLATION 12/16/2008    Current Outpatient Medications on File Prior to Visit  Medication Sig Dispense Refill  . acetaminophen (TYLENOL) 500 MG tablet Take 500 mg by mouth every 6 (six) hours as needed.    Marland Kitchen aspirin 81 MG tablet Take 81 mg by mouth daily.      Marland Kitchen azelastine (ASTELIN) 0.1 % nasal spray Place 2 sprays into the nose 2 (two) times daily as needed for rhinitis or allergies.    Marland Kitchen b complex vitamins tablet Take 1 tablet by mouth daily.    Marland Kitchen diltiazem (CARDIZEM CD) 120 MG 24 hr capsule TAKE ONE CAPSULE BY MOUTH NIGHTLY AT BEDTIME 90 capsule 3  . Fexofenadine HCl (ALLEGRA ALLERGY PO) Take 1 tablet by mouth daily.     . furosemide (LASIX) 20 MG tablet TAKE ONE TABLET BY MOUTH DAILY 90 tablet 2  . isosorbide mononitrate  (IMDUR) 30 MG 24 hr tablet TAKE 1 TABLET (30 MG TOTAL) BY MOUTH AT BEDTIME. 90 tablet 3  . lisinopril (ZESTRIL) 10 MG tablet TAKE 1 TABLET BY MOUTH EVERY DAY 90 tablet 3  . metoprolol succinate (TOPROL-XL) 100 MG 24 hr tablet TAKE 1 TABLET BY MOUTH EVERY DAY 90 tablet 1  . Multiple Vitamin (MULTIVITAMIN WITH MINERALS) TABS tablet Take 1 tablet by mouth daily.    . potassium chloride (MICRO-K) 10 MEQ CR capsule TAKE 1 CAPSULE (10 MEQ TOTAL) BY MOUTH DAILY. 90 capsule 3  . rosuvastatin (CRESTOR) 10 MG tablet TAKE 1 TABLET BY MOUTH EVERY DAY 90 tablet 1  . sodium chloride (OCEAN) 0.65 % SOLN nasal spray Place 1 spray into both nostrils as needed for congestion.    Marland Kitchen tobramycin (TOBREX) 0.3 % ophthalmic solution Place 1 drop into both eyes daily as needed.    . warfarin (COUMADIN) 1 MG tablet TAKE 1 TABLET BY MOUTH DAILY (TAKE IN ADDITION TO 2.5MG ) 30 tablet 0  . warfarin (COUMADIN) 2.5 MG tablet Take 1 tablet daily or take as directed by anticoagulation clinic 90 tablet 1   No current facility-administered medications on file prior to visit.    Past Medical History:  Diagnosis Date  . Atrial fibrillation ([place])    a. chronic anticoag, failed prior DCCV;  b. 03/2010 Echo: EF 55-65%, mod MR, mildly to mod  dil LA, mod dil RA, mild to mod MR.  Marland Kitchen CAD (coronary artery disease)    a. BMS to RCA 96';  b. Stent PDA and OM DES 2006  . CAROTID ARTERY DISEASE    a. 11/2011 Carotid U/S:  0-39% bilat dzs.  Marland Kitchen HTN (hypertension)   . Hyperlipidemia   . LBBB (left bundle branch block)   . Macular degeneration, wet ([place]) 10/2012 dx   L>R, follows with retinal specialist->injections monthly.  . Obesity   . Vertigo     Past Surgical History:  Procedure Laterality Date  . [place], 2006 x2   Drug-eluting stent placement to the PDA, drug-eluting stent  placement to the first obtuse  marginal, StarClose closure of the right common femoral arteriotomy site. Successful drug-eluting stent   placement in  both the posterior descending artery and the obtuse marginal. The lesion was directly stented using a 2.5 x 16 mm Taxus deployed at 14 atmospheres.   Marland Kitchen EYE SURGERY     Tear duct stent    Social History   Socioeconomic History  . Marital status: Widowed    Spouse name: Not on file  . Number of children: Not on file  . Years of education: Not on file  . Highest education level: Not on file  Occupational History  . Not on file  Tobacco Use  . Smoking status: Never Smoker  . Smokeless tobacco: Never Used  Substance and Sexual Activity  . Alcohol use: No  . Drug use: No  . Sexual activity: Not on file  Other Topics Concern  . Not on file  Social History Narrative   Lives in [place] by herself.  She is active around the house w/o symptoms or limitations.   Social Determinants of Health   Financial Resource Strain:   . Difficulty of Paying Living Expenses: Not on file  Food Insecurity:   . Worried About Charity fundraiser in the Last Year: Not on file  . Ran Out of Food in the Last Year: Not on file  Transportation Needs:   . Lack of Transportation (Medical): Not on file  . Lack of Transportation (Non-Medical): Not on file  Physical Activity:   . Days of Exercise per Week: Not on file  . Minutes of Exercise per Session: Not on file  Stress:   . Feeling of Stress : Not on file  Social Connections:   . Frequency of Communication with Friends and Family: Not on file  . Frequency of Social Gatherings with Friends and Family: Not on file  . Attends Religious Services: Not on file  . Active Member of Clubs or Organizations: Not on file  . Attends Archivist Meetings: Not on file  . Marital Status: Not on file    Family History  Problem Relation Age of Onset  . Arthritis Mother        died in her 25's  . Arthritis Father        died in [place].  Marland Kitchen CAD Brother        deceased  . CAD Brother        deceased  . CAD Sister        deceased    Review of  Systems  Constitutional: Negative for chills and fever.  Respiratory: Positive for cough (from drainage). Negative for shortness of breath and wheezing.   Cardiovascular: Positive for palpitations (Afib) and leg swelling (occ left leg - ? related to knee).  Negative for chest pain.  Neurological: Negative for light-headedness and headaches.       Objective:   Vitals:   08/15/19 0926  BP: 128/70  Pulse: 86  Resp: 16  Temp: 98.4 F (36.9 C)  SpO2: 97%   BP Readings from Last 3 Encounters:  08/15/19 128/70  03/24/19 130/62  03/18/19 97/67   Wt Readings from Last 3 Encounters:  08/15/19 171 lb (77.6 kg)  03/24/19 180 lb (81.6 kg)  02/12/19 190 lb (86.2 kg)   Body mass index is 30.29 kg/m.   Physical Exam    Constitutional: Appears well-developed and well-nourished. No distress.  HENT:  Head: Normocephalic and atraumatic.  Neck: Neck supple. No tracheal deviation present. No thyromegaly present.  No cervical lymphadenopathy Cardiovascular: Normal rate, regular rhythm and normal heart sounds.   No murmur heard. No carotid bruit .  No edema Pulmonary/Chest: Effort normal and breath sounds normal. No respiratory distress. No has no wheezes. No rales.  Skin: Skin is warm and dry. Not diaphoretic.  Psychiatric: Normal mood and affect. Behavior is normal.      Assessment & Plan:    See Problem List for Assessment and Plan of chronic medical problems.    This visit occurred during the SARS-CoV-2 public health emergency.  Safety protocols were in place, including screening questions prior to the visit, additional usage of staff PPE, and extensive cleaning of exam room while observing appropriate contact time as indicated for disinfecting solutions.

## 2019-08-15 ENCOUNTER — Encounter: Payer: Self-pay | Admitting: Internal Medicine

## 2019-08-15 ENCOUNTER — Other Ambulatory Visit: Payer: Self-pay

## 2019-08-15 ENCOUNTER — Ambulatory Visit (INDEPENDENT_AMBULATORY_CARE_PROVIDER_SITE_OTHER): Payer: Medicare Other | Admitting: Internal Medicine

## 2019-08-15 VITALS — BP 128/70 | HR 86 | Temp 98.4°F | Resp 16 | Ht 63.0 in | Wt 171.0 lb

## 2019-08-15 DIAGNOSIS — E782 Mixed hyperlipidemia: Secondary | ICD-10-CM | POA: Diagnosis not present

## 2019-08-15 DIAGNOSIS — I2510 Atherosclerotic heart disease of native coronary artery without angina pectoris: Secondary | ICD-10-CM

## 2019-08-15 DIAGNOSIS — Z8673 Personal history of transient ischemic attack (TIA), and cerebral infarction without residual deficits: Secondary | ICD-10-CM | POA: Diagnosis not present

## 2019-08-15 DIAGNOSIS — I10 Essential (primary) hypertension: Secondary | ICD-10-CM

## 2019-08-15 DIAGNOSIS — G6289 Other specified polyneuropathies: Secondary | ICD-10-CM | POA: Diagnosis not present

## 2019-08-15 DIAGNOSIS — E119 Type 2 diabetes mellitus without complications: Secondary | ICD-10-CM | POA: Diagnosis not present

## 2019-08-15 DIAGNOSIS — I4891 Unspecified atrial fibrillation: Secondary | ICD-10-CM | POA: Diagnosis not present

## 2019-08-15 LAB — COMPREHENSIVE METABOLIC PANEL
ALT: 15 U/L (ref 0–35)
AST: 22 U/L (ref 0–37)
Albumin: 4.3 g/dL (ref 3.5–5.2)
Alkaline Phosphatase: 63 U/L (ref 39–117)
BUN: 15 mg/dL (ref 6–23)
CO2: 27 mEq/L (ref 19–32)
Calcium: 10.3 mg/dL (ref 8.4–10.5)
Chloride: 105 mEq/L (ref 96–112)
Creatinine, Ser: 1.02 mg/dL (ref 0.40–1.20)
GFR: 51.26 mL/min — ABNORMAL LOW (ref >60.00)
Glucose, Bld: 104 mg/dL — ABNORMAL HIGH (ref 70–99)
Potassium: 4.3 mEq/L (ref 3.5–5.1)
Sodium: 140 mEq/L (ref 135–145)
Total Bilirubin: 0.5 mg/dL (ref 0.2–1.2)
Total Protein: 7.5 g/dL (ref 6.0–8.3)

## 2019-08-15 LAB — CBC WITH DIFFERENTIAL/PLATELET
Basophils Absolute: 0 10*3/uL (ref 0.0–0.1)
Basophils Relative: 0.6 % (ref 0.0–3.0)
Eosinophils Absolute: 0.2 10*3/uL (ref 0.0–0.7)
Eosinophils Relative: 2.8 % (ref 0.0–5.0)
HCT: 41.7 % (ref 36.0–46.0)
Hemoglobin: 14.2 g/dL (ref 12.0–15.0)
Lymphocytes Relative: 32.5 % (ref 12.0–46.0)
Lymphs Abs: 2.5 10*3/uL (ref 0.7–4.0)
MCHC: 34 g/dL (ref 30.0–36.0)
MCV: 90.5 fl (ref 78.0–100.0)
Monocytes Absolute: 0.7 10*3/uL (ref 0.1–1.0)
Monocytes Relative: 9 % (ref 3.0–12.0)
Neutro Abs: 4.2 10*3/uL (ref 1.4–7.7)
Neutrophils Relative %: 55.1 % (ref 43.0–77.0)
Platelets: 216 10*3/uL (ref 150.0–400.0)
RBC: 4.61 Mil/uL (ref 3.87–5.11)
RDW: 13.7 % (ref 11.5–15.5)
WBC: 7.7 10*3/uL (ref 4.0–10.5)

## 2019-08-15 LAB — LIPID PANEL
Cholesterol: 185 mg/dL (ref 0–200)
HDL: 52 mg/dL (ref >39.00)
NonHDL: 133.32
Total CHOL/HDL Ratio: 4
Triglycerides: 204 mg/dL — ABNORMAL HIGH (ref 0.0–149.0)
VLDL: 40.8 mg/dL — ABNORMAL HIGH (ref 0.0–40.0)

## 2019-08-15 LAB — LDL CHOLESTEROL, DIRECT: Direct LDL: 97 mg/dL

## 2019-08-15 LAB — HEMOGLOBIN A1C: Hgb A1c MFr Bld: 6.3 % (ref 4.6–6.5)

## 2019-08-15 MED ORDER — NITROGLYCERIN 0.4 MG SL SUBL: SUBLINGUAL_TABLET | SUBLINGUAL | 8 refills | Status: DC

## 2019-08-15 MED ORDER — MECLIZINE HCL 25 MG PO TABS: 25.0000 mg | ORAL_TABLET | Freq: Three times a day (TID) | ORAL | 5 refills | Status: DC | PRN

## 2019-08-15 NOTE — Assessment & Plan Note (Signed)
Chronic BP well controlled Current regimen effective and well tolerated Continue current medications at current doses cmp  

## 2019-08-15 NOTE — Assessment & Plan Note (Signed)
Chronic Taking crestor, ASA 81 mg, warfarin BP well controlled

## 2019-08-15 NOTE — Assessment & Plan Note (Signed)
Chronic Diet controlled Check a1c Low sugar / carb diet Stressed regular exercise

## 2019-08-15 NOTE — Assessment & Plan Note (Signed)
Chronic Following with cardiology Rate controlled On warfarin, metoprolol Cbc, cmp

## 2019-08-15 NOTE — Assessment & Plan Note (Signed)
Chronic Mild, intermittent N/T  Not on medication Sugars well controlled

## 2019-08-15 NOTE — Assessment & Plan Note (Signed)
Chronic Check lipid panel  Continue daily statin Regular exercise and healthy diet encouraged  

## 2019-08-18 ENCOUNTER — Telehealth: Payer: Self-pay | Admitting: Internal Medicine

## 2019-08-18 NOTE — Telephone Encounter (Signed)
Please return  call to discuss lab results 

## 2019-08-19 NOTE — Telephone Encounter (Signed)
LVM for pt to call back in regards.  

## 2019-08-19 NOTE — Telephone Encounter (Signed)
Please return  call to discuss lab results 

## 2019-08-20 ENCOUNTER — Other Ambulatory Visit: Payer: Self-pay

## 2019-08-20 MED ORDER — METOPROLOL SUCCINATE ER 100 MG PO TB24: 100.0000 mg | ORAL_TABLET | Freq: Every day | ORAL | 1 refills | Status: DC

## 2019-08-26 ENCOUNTER — Other Ambulatory Visit: Payer: Self-pay

## 2019-08-26 ENCOUNTER — Ambulatory Visit (INDEPENDENT_AMBULATORY_CARE_PROVIDER_SITE_OTHER): Payer: Medicare Other | Admitting: General Practice

## 2019-08-26 ENCOUNTER — Other Ambulatory Visit: Payer: Self-pay | Admitting: General Practice

## 2019-08-26 DIAGNOSIS — Z7901 Long term (current) use of anticoagulants: Secondary | ICD-10-CM | POA: Diagnosis not present

## 2019-08-26 DIAGNOSIS — I4891 Unspecified atrial fibrillation: Secondary | ICD-10-CM

## 2019-08-26 LAB — POCT INR: INR: 2 (ref 2.0–3.0)

## 2019-08-26 MED ORDER — WARFARIN SODIUM 1 MG PO TABS: ORAL_TABLET | ORAL | 0 refills | Status: DC

## 2019-08-26 NOTE — Patient Instructions (Addendum)
Pre visit review using our clinic review tool, if applicable. No additional management support is needed unless otherwise documented below in the visit note.  Continue to take 1 (2.5 mg) tablet daily except take 2 (1 mg tablets ) on Fridays.

## 2019-08-26 NOTE — Progress Notes (Signed)
Agree with management.  Judy [name] Judy [name] Charlie Seda, MD  

## 2019-09-15 DIAGNOSIS — M1712 Unilateral primary osteoarthritis, left knee: Secondary | ICD-10-CM | POA: Diagnosis not present

## 2019-09-15 DIAGNOSIS — M25562 Pain in left knee: Secondary | ICD-10-CM | POA: Diagnosis not present

## 2019-09-15 DIAGNOSIS — M25561 Pain in right knee: Secondary | ICD-10-CM | POA: Diagnosis not present

## 2019-09-23 NOTE — Progress Notes (Signed)
Date:  09/29/2019   ID:  Charlette Caffey, DOB February 13, 1931, MRN [id number]  PCP:  Binnie Rail, MD  Cardiologist:   Johnsie Cancel Electrophysiologist:  None   Evaluation Performed:  Follow-Up Visit  Chief Complaint:  CAD/AFib  History of Present Illness:    86 y.o. history of CAD. First stent RCA 1996 and PDA/OM in 2006. Distant cerebellar stroke No carotid disease. Chronic afib on coumadin. Activity limited by macular degeneration and peripheral neuropathy. Has seen primary and neurology on B12 ? From diet controlled DM as well On statin for HLD  Statin Changed to crestor due to interaction of simvastatin with cardizem    No palpitations, bleeding , chest pain   She has no cardiac  complaints    Daughter with her today  Has had COVID vaccine   The patient  does not have symptoms concerning for COVID-19 infection (fever, chills, cough, or new shortness of breath).    Past Medical History:  Diagnosis Date  . Atrial fibrillation ([place])    a. chronic anticoag, failed prior DCCV;  b. 03/2010 Echo: EF 55-65%, mod MR, mildly to mod dil LA, mod dil RA, mild to mod MR.  Marland Kitchen CAD (coronary artery disease)    a. BMS to RCA 96';  b. Stent PDA and OM DES 2006  . CAROTID ARTERY DISEASE    a. 11/2011 Carotid U/S:  0-39% bilat dzs.  Marland Kitchen HTN (hypertension)   . Hyperlipidemia   . LBBB (left bundle branch block)   . Macular degeneration, wet ([place]) 10/2012 dx   L>R, follows with retinal specialist->injections monthly.  . Obesity   . Vertigo    Past Surgical History:  Procedure Laterality Date  . [place], 2006 x2   Drug-eluting stent placement to the PDA, drug-eluting stent  placement to the first obtuse  marginal, StarClose closure of the right common femoral arteriotomy site. Successful drug-eluting stent  placement in  both the posterior descending artery and the obtuse marginal. The lesion was directly stented using a 2.5 x 16 mm Taxus deployed at 14 atmospheres.   Marland Kitchen EYE  SURGERY     Tear duct stent     Current Meds  Medication Sig  . acetaminophen (TYLENOL) 500 MG tablet Take 500 mg by mouth every 6 (six) hours as needed.  Marland Kitchen aspirin 81 MG tablet Take 81 mg by mouth daily.    Marland Kitchen azelastine (ASTELIN) 0.1 % nasal spray Place 2 sprays into the nose 2 (two) times daily as needed for rhinitis or allergies.  Marland Kitchen b complex vitamins tablet Take 1 tablet by mouth daily.  Marland Kitchen diltiazem (CARDIZEM CD) 120 MG 24 hr capsule TAKE ONE CAPSULE BY MOUTH NIGHTLY AT BEDTIME  . Fexofenadine HCl (ALLEGRA ALLERGY PO) Take 1 tablet by mouth daily.   . furosemide (LASIX) 20 MG tablet TAKE ONE TABLET BY MOUTH DAILY  . isosorbide mononitrate (IMDUR) 30 MG 24 hr tablet TAKE 1 TABLET (30 MG TOTAL) BY MOUTH AT BEDTIME.  Marland Kitchen lisinopril (ZESTRIL) 10 MG tablet TAKE 1 TABLET BY MOUTH EVERY DAY  . meclizine (ANTIVERT) 25 MG tablet Take 1 tablet (25 mg total) by mouth 3 (three) times daily as needed for dizziness.  . metoprolol succinate (TOPROL-XL) 100 MG 24 hr tablet Take 1 tablet (100 mg total) by mouth daily. Take with or immediately following a meal.  . Multiple Vitamin (MULTIVITAMIN WITH MINERALS) TABS tablet Take 1 tablet by mouth daily.  . nitroGLYCERIN (NITROSTAT) 0.4 MG  SL tablet PLACE 1 TABLET UNDER TONGUE EVERY 5 MINUTES AS NEEDED FOR CHEST PAIN  . potassium chloride (MICRO-K) 10 MEQ CR capsule TAKE 1 CAPSULE (10 MEQ TOTAL) BY MOUTH DAILY.  . rosuvastatin (CRESTOR) 10 MG tablet TAKE 1 TABLET BY MOUTH EVERY DAY  . sodium chloride (OCEAN) 0.65 % SOLN nasal spray Place 1 spray into both nostrils as needed for congestion.  Marland Kitchen tobramycin (TOBREX) 0.3 % ophthalmic solution Place 1 drop into both eyes daily as needed.  . warfarin (COUMADIN) 1 MG tablet Take 2 tablets on Fridays or as directed by anticoagulation clinic  . warfarin (COUMADIN) 2.5 MG tablet Take 1 tablet daily or take as directed by anticoagulation clinic     Allergies:   Betadine [povidone iodine] and Methocarbamol   Social  History   Tobacco Use  . Smoking status: Never Smoker  . Smokeless tobacco: Never Used  Substance Use Topics  . Alcohol use: No  . Drug use: No     Family Hx: The patient's family history includes Arthritis in her father and mother; CAD in her brother, brother, and sister.  ROS:   Please see the history of present illness.     All other systems reviewed and are negative.   Prior CV studies:   The following studies were reviewed today:  Carotid 12/18/18 plaque no stenosis   Labs/Other Tests and Data Reviewed:    EKG:  9//2/20  afib old IMI nonspecific ST chagnes   Recent Labs: 08/15/2019: ALT 15; BUN 15; Creatinine, Ser 1.02; Hemoglobin 14.2; Platelets 216.0; Potassium 4.3; Sodium 140   Recent Lipid Panel Lab Results  Component Value Date/Time   CHOL 185 08/15/2019 10:05 AM   CHOL 178 05/12/2019 09:25 AM   TRIG 204.0 (H) 08/15/2019 10:05 AM   HDL 52.00 08/15/2019 10:05 AM   HDL 47 05/12/2019 09:25 AM   CHOLHDL 4 08/15/2019 10:05 AM   LDLCALC 84 05/12/2019 09:25 AM   LDLDIRECT 97.0 08/15/2019 10:05 AM    Wt Readings from Last 3 Encounters:  09/29/19 166 lb (75.3 kg)  08/15/19 171 lb (77.6 kg)  03/24/19 180 lb (81.6 kg)     Objective:    Vital Signs:  BP 116/66   Pulse 66   Ht 5\' 3"  (1.6 m)   Wt 166 lb (75.3 kg)   SpO2 98%   BMI 29.41 kg/m    Affect appropriate Healthy:  appears stated age HEENT: normal Neck supple with no adenopathy JVP normal no bruits no thyromegaly Lungs clear with no wheezing and good diaphragmatic motion Heart:  S1/S2 no murmur, no rub, gallop or click PMI normal Abdomen: benighn, BS positve, no tenderness, no AAA no bruit.  No HSM or HJR Distal pulses intact with no bruits No edema Neuro non-focal Skin warm and dry No muscular weakness   ASSESSMENT & PLAN:    CAD:  Distant intervention to RCA and OM last in 2006 no angina continue medical RX given age  HLD:  Continue statin LDL 97  08/15/19   DM:  Diet controlled A1c  6.12 August 2018  Afib:  Good rate control INR consistently Rx  2.0 last on 08/26/19   Neuropathy:  On B12 ? Related to DM f/u neurology   Macular Degeneration: f/u Dr Tye Savoy has had injections and improved  COVID-19 Education: The signs and symptoms of COVID-19 were discussed with the patient and how to seek care for testing (follow up with PCP or arrange E-visit).  The importance of social  distancing was discussed today.   Medication Adjustments/Labs and Tests Ordered: Current medicines are reviewed at length with the patient today.  Concerns regarding medicines are outlined above.   Tests Ordered:  None   Medication Changes:  None   Disposition:  Follow up in a year   Signed, Jenkins Rouge, MD  09/29/2019 10:23 AM    [place]

## 2019-09-29 ENCOUNTER — Other Ambulatory Visit: Payer: Self-pay

## 2019-09-29 ENCOUNTER — Encounter: Payer: Self-pay | Admitting: Cardiovascular Disease

## 2019-09-29 ENCOUNTER — Ambulatory Visit (INDEPENDENT_AMBULATORY_CARE_PROVIDER_SITE_OTHER): Payer: Medicare Other | Admitting: Cardiovascular Disease

## 2019-09-29 VITALS — BP 116/66 | HR 66 | Ht 63.0 in | Wt 166.0 lb

## 2019-09-29 DIAGNOSIS — I2510 Atherosclerotic heart disease of native coronary artery without angina pectoris: Secondary | ICD-10-CM | POA: Diagnosis not present

## 2019-09-29 NOTE — Patient Instructions (Addendum)
Medication Instructions:   *If you need a refill on your cardiac medications before your next appointment, please call your pharmacy*  Lab Work:  If you have labs (blood work) drawn today and your tests are completely normal, you will receive your results only by: Marland Kitchen MyChart Message (if you have MyChart) OR . A paper copy in the mail If you have any lab test that is abnormal or we need to change your treatment, we will call you to review the results.  Follow-Up: At [facility], you and your health needs are our priority.  As part of our continuing mission to provide you with exceptional heart care, we have created designated Provider Care Teams.  These Care Teams include your primary Cardiologist (physician) and Advanced Practice Providers (APPs -  Physician Assistants and Nurse Practitioners) who all work together to provide you with the care you need, when you need it.  We recommend signing up for the patient portal called "MyChart".  Sign up information is provided on this After Visit Summary.  MyChart is used to connect with patients for Virtual Visits (Telemedicine).  Patients are able to view lab/test results, encounter notes, upcoming appointments, etc.  Non-urgent messages can be sent to your provider as well.   To learn more about what you can do with MyChart, go to [email or web address].    Your next appointment:   12 month(s)  The format for your next appointment:   Either In Person or Virtual  Provider:   You may see Jenkins Rouge, MDor one of the following Advanced Practice Providers on your designated Care Team:    Truitt Merle, NP  Cecilie Kicks, NP  Kathyrn Drown, NP

## 2019-09-30 ENCOUNTER — Ambulatory Visit (INDEPENDENT_AMBULATORY_CARE_PROVIDER_SITE_OTHER): Payer: Medicare Other | Admitting: General Practice

## 2019-09-30 ENCOUNTER — Other Ambulatory Visit: Payer: Self-pay

## 2019-09-30 DIAGNOSIS — Z7901 Long term (current) use of anticoagulants: Secondary | ICD-10-CM

## 2019-09-30 DIAGNOSIS — I4891 Unspecified atrial fibrillation: Secondary | ICD-10-CM

## 2019-09-30 LAB — POCT INR: INR: 2.2 (ref 2.0–3.0)

## 2019-09-30 NOTE — Patient Instructions (Addendum)
Pre visit review using our clinic review tool, if applicable. No additional management support is needed unless otherwise documented below in the visit note.  Continue to take 1 (2.5 mg) tablet daily except take 2 (1 mg tablets ) on Fridays. Re-check in 6 weeks.

## 2019-09-30 NOTE — Progress Notes (Signed)
Agree with management.  Judy [name] J Vivia Rosenburg, MD  

## 2019-10-03 ENCOUNTER — Telehealth: Payer: Medicare Other

## 2019-10-20 ENCOUNTER — Other Ambulatory Visit: Payer: Self-pay | Admitting: Internal Medicine

## 2019-10-20 ENCOUNTER — Other Ambulatory Visit: Payer: Self-pay | Admitting: Cardiovascular Disease

## 2019-10-20 DIAGNOSIS — Z7901 Long term (current) use of anticoagulants: Secondary | ICD-10-CM

## 2019-10-21 DIAGNOSIS — H43813 Vitreous degeneration, bilateral: Secondary | ICD-10-CM | POA: Diagnosis not present

## 2019-10-21 DIAGNOSIS — H43393 Other vitreous opacities, bilateral: Secondary | ICD-10-CM | POA: Diagnosis not present

## 2019-10-21 DIAGNOSIS — H35372 Puckering of macula, left eye: Secondary | ICD-10-CM | POA: Diagnosis not present

## 2019-10-21 DIAGNOSIS — H353231 Exudative age-related macular degeneration, bilateral, with active choroidal neovascularization: Secondary | ICD-10-CM | POA: Diagnosis not present

## 2019-11-11 ENCOUNTER — Ambulatory Visit (INDEPENDENT_AMBULATORY_CARE_PROVIDER_SITE_OTHER): Payer: Medicare Other | Admitting: General Practice

## 2019-11-11 ENCOUNTER — Other Ambulatory Visit: Payer: Self-pay

## 2019-11-11 DIAGNOSIS — Z7901 Long term (current) use of anticoagulants: Secondary | ICD-10-CM | POA: Diagnosis not present

## 2019-11-11 DIAGNOSIS — I4891 Unspecified atrial fibrillation: Secondary | ICD-10-CM

## 2019-11-11 LAB — POCT INR: INR: 2.1 (ref 2.0–3.0)

## 2019-11-11 NOTE — Patient Instructions (Signed)
Pre visit review using our clinic review tool, if applicable. No additional management support is needed unless otherwise documented below in the visit note.  Continue to take 1 (2.5 mg) tablet daily except take 2 (1 mg tablets ) on Fridays. Re-check in 6 weeks.

## 2019-11-11 NOTE — Progress Notes (Signed)
Agree with management.  [place] Throckmorton J Conswella Bruney, MD  

## 2019-12-23 ENCOUNTER — Other Ambulatory Visit: Payer: Self-pay

## 2019-12-23 ENCOUNTER — Ambulatory Visit: Payer: Medicare Other

## 2019-12-23 ENCOUNTER — Ambulatory Visit (INDEPENDENT_AMBULATORY_CARE_PROVIDER_SITE_OTHER): Payer: Medicare Other | Admitting: General Practice

## 2019-12-23 DIAGNOSIS — Z7901 Long term (current) use of anticoagulants: Secondary | ICD-10-CM | POA: Diagnosis not present

## 2019-12-23 DIAGNOSIS — I4891 Unspecified atrial fibrillation: Secondary | ICD-10-CM

## 2019-12-23 LAB — POCT INR: INR: 3 (ref 2.0–3.0)

## 2019-12-23 NOTE — Progress Notes (Signed)
Agree with management.  Aniko Finnigan J Delmo Matty, MD  

## 2019-12-23 NOTE — Patient Instructions (Signed)
Pre visit review using our clinic review tool, if applicable. No additional management support is needed unless otherwise documented below in the visit note.  Skip coumadin today (7/13) and then change dosage and take 1 (2.5 mg) tablet daily except take 1 (1 mg tablets ) on Fridays. Re-check in 4 weeks.

## 2019-12-30 DIAGNOSIS — H43393 Other vitreous opacities, bilateral: Secondary | ICD-10-CM | POA: Diagnosis not present

## 2019-12-30 DIAGNOSIS — H35372 Puckering of macula, left eye: Secondary | ICD-10-CM | POA: Diagnosis not present

## 2019-12-30 DIAGNOSIS — H43813 Vitreous degeneration, bilateral: Secondary | ICD-10-CM | POA: Diagnosis not present

## 2019-12-30 DIAGNOSIS — H353231 Exudative age-related macular degeneration, bilateral, with active choroidal neovascularization: Secondary | ICD-10-CM | POA: Diagnosis not present

## 2020-01-20 ENCOUNTER — Other Ambulatory Visit: Payer: Self-pay

## 2020-01-20 ENCOUNTER — Ambulatory Visit (INDEPENDENT_AMBULATORY_CARE_PROVIDER_SITE_OTHER): Payer: Medicare Other | Admitting: General Practice

## 2020-01-20 DIAGNOSIS — I4891 Unspecified atrial fibrillation: Secondary | ICD-10-CM

## 2020-01-20 DIAGNOSIS — Z7901 Long term (current) use of anticoagulants: Secondary | ICD-10-CM | POA: Diagnosis not present

## 2020-01-20 LAB — POCT INR: INR: 2.3 (ref 2.0–3.0)

## 2020-01-20 NOTE — Patient Instructions (Addendum)
Pre visit review using our clinic review tool, if applicable. No additional management support is needed unless otherwise documented below in the visit note.  Continue to take 1 (2.5 mg) tablet daily except take 1 (1 mg tablets ) on Fridays. Re-check in 4 weeks.

## 2020-01-20 NOTE — Progress Notes (Signed)
Medical screening examination/treatment/procedure(s) were performed by non-physician practitioner and as supervising physician I was immediately available for consultation/collaboration. I agree with above. Teal Bontrager, MD   

## 2020-01-29 ENCOUNTER — Other Ambulatory Visit: Payer: Self-pay | Admitting: Cardiovascular Disease

## 2020-02-06 ENCOUNTER — Other Ambulatory Visit: Payer: Self-pay | Admitting: Internal Medicine

## 2020-02-06 DIAGNOSIS — Z7901 Long term (current) use of anticoagulants: Secondary | ICD-10-CM

## 2020-02-14 ENCOUNTER — Other Ambulatory Visit: Payer: Self-pay | Admitting: Internal Medicine

## 2020-02-15 ENCOUNTER — Other Ambulatory Visit: Payer: Self-pay | Admitting: Cardiovascular Disease

## 2020-02-15 ENCOUNTER — Encounter: Payer: Self-pay | Admitting: Internal Medicine

## 2020-02-15 NOTE — Patient Instructions (Addendum)
  Blood work was ordered.   ° ° °Medications reviewed and updated.  Changes include :   none ° ° ° °Please followup in 6 months ° ° °

## 2020-02-15 NOTE — Progress Notes (Signed)
Subjective:    Patient ID: Judy [name], female    DOB: 03-01-1931, 86 y.o.   MRN: [id number]  HPI The patient is here for follow up of their chronic medical problems, including DM, Afib, htn, hyperlipidemia, h/o CVA, neuropathy, B12 def  She is taking all of her medications as prescribed.   She is not able to exercise due to knee pain.  She is eating well.     Medications and allergies reviewed with patient and updated if appropriate.  Patient Active Problem List   Diagnosis Date Noted  . History of R cerebellar stroke 08/11/2018  . B12 deficiency 02/04/2018  . Allergic rhinitis 08/06/2017  . Diabetes ([place]) 09/27/2015  . Peripheral neuropathy 09/22/2015  . Encounter for therapeutic drug monitoring 07/25/2013  . Macular degeneration, wet ([place])   . LBBB (left bundle branch block) 03/21/2011  . Long term (current) use of anticoagulants 09/08/2010  . Carotid artery disease ([place]) 08/31/2009  . OBESITY 08/23/2009  . Mixed hyperlipidemia 12/16/2008  . Essential hypertension, benign 12/16/2008  . CAD, NATIVE VESSEL 12/16/2008  . ATRIAL FIBRILLATION 12/16/2008    Current Outpatient Medications on File Prior to Visit  Medication Sig Dispense Refill  . acetaminophen (TYLENOL) 500 MG tablet Take 500 mg by mouth every 6 (six) hours as needed.    Marland Kitchen aspirin 81 MG tablet Take 81 mg by mouth daily.      Marland Kitchen azelastine (ASTELIN) 0.1 % nasal spray Place 2 sprays into the nose 2 (two) times daily as needed for rhinitis or allergies.    Marland Kitchen b complex vitamins tablet Take 1 tablet by mouth daily.    Marland Kitchen diltiazem (CARDIZEM CD) 120 MG 24 hr capsule TAKE ONE CAPSULE BY MOUTH NIGHTLY AT BEDTIME 90 capsule 3  . Fexofenadine HCl (ALLEGRA ALLERGY PO) Take 1 tablet by mouth daily.     . furosemide (LASIX) 20 MG tablet TAKE ONE TABLET BY MOUTH DAILY 90 tablet 2  . isosorbide mononitrate (IMDUR) 30 MG 24 hr tablet TAKE 1 TABLET (30 MG TOTAL) BY MOUTH AT BEDTIME. 90 tablet 3  . lisinopril (ZESTRIL) 10  MG tablet TAKE 1 TABLET BY MOUTH EVERY DAY 90 tablet 3  . meclizine (ANTIVERT) 25 MG tablet Take 1 tablet (25 mg total) by mouth 3 (three) times daily as needed for dizziness. 30 tablet 5  . Multiple Vitamin (MULTIVITAMIN WITH MINERALS) TABS tablet Take 1 tablet by mouth daily.    . nitroGLYCERIN (NITROSTAT) 0.4 MG SL tablet PLACE 1 TABLET UNDER TONGUE EVERY 5 MINUTES AS NEEDED FOR CHEST PAIN 25 tablet 8  . potassium chloride (MICRO-K) 10 MEQ CR capsule TAKE 1 CAPSULE (10 MEQ TOTAL) BY MOUTH DAILY. 90 capsule 3  . rosuvastatin (CRESTOR) 10 MG tablet TAKE 1 TABLET BY MOUTH EVERY DAY 90 tablet 2  . sodium chloride (OCEAN) 0.65 % SOLN nasal spray Place 1 spray into both nostrils as needed for congestion.    Marland Kitchen tobramycin (TOBREX) 0.3 % ophthalmic solution Place 1 drop into both eyes daily as needed.    . warfarin (COUMADIN) 1 MG tablet TAKE 2 TABLETS ON FRIDAYS OR AS DIRECTED BY ANTICOAGULATION CLINIC 30 tablet 0  . warfarin (COUMADIN) 2.5 MG tablet Take 1 tablet daily or take as directed by anticoagulation clinic 90 tablet 1  . metoprolol succinate (TOPROL-XL) 100 MG 24 hr tablet TAKE 1 TABLET (100 MG TOTAL) BY MOUTH DAILY. TAKE WITH OR IMMEDIATELY FOLLOWING A MEAL. 90 tablet 1   No current facility-administered medications  on file prior to visit.    Past Medical History:  Diagnosis Date  . Atrial fibrillation ([place])    a. chronic anticoag, failed prior DCCV;  b. 03/2010 Echo: EF 55-65%, mod MR, mildly to mod dil LA, mod dil RA, mild to mod MR.  Marland Kitchen CAD (coronary artery disease)    a. BMS to RCA 96';  b. Stent PDA and OM DES 2006  . CAROTID ARTERY DISEASE    a. 11/2011 Carotid U/S:  0-39% bilat dzs.  Marland Kitchen HTN (hypertension)   . Hyperlipidemia   . LBBB (left bundle branch block)   . Macular degeneration, wet ([place]) 10/2012 dx   L>R, follows with retinal specialist->injections monthly.  . Obesity   . Vertigo     Past Surgical History:  Procedure Laterality Date  . [place],  2006 x2   Drug-eluting stent placement to the PDA, drug-eluting stent  placement to the first obtuse  marginal, StarClose closure of the right common femoral arteriotomy site. Successful drug-eluting stent  placement in  both the posterior descending artery and the obtuse marginal. The lesion was directly stented using a 2.5 x 16 mm Taxus deployed at 14 atmospheres.   Marland Kitchen EYE SURGERY     Tear duct stent    Social History   Socioeconomic History  . Marital status: Widowed    Spouse name: Not on file  . Number of children: Not on file  . Years of education: Not on file  . Highest education level: Not on file  Occupational History  . Not on file  Tobacco Use  . Smoking status: Never Smoker  . Smokeless tobacco: Never Used  Vaping Use  . Vaping Use: Never used  Substance and Sexual Activity  . Alcohol use: No  . Drug use: No  . Sexual activity: Not on file  Other Topics Concern  . Not on file  Social History Narrative   Lives in [place] by herself.  She is active around the house w/o symptoms or limitations.   Social Determinants of Health   Financial Resource Strain: Low Risk   . Difficulty of Paying Living Expenses: Not hard at all  Food Insecurity: No Food Insecurity  . Worried About Charity fundraiser in the Last Year: Never true  . Ran Out of Food in the Last Year: Never true  Transportation Needs: No Transportation Needs  . Lack of Transportation (Medical): No  . Lack of Transportation (Non-Medical): No  Physical Activity: Sufficiently Active  . Days of Exercise per Week: 5 days  . Minutes of Exercise per Session: 30 min  Stress: Stress Concern Present  . Feeling of Stress : Rather much  Social Connections: Moderately Integrated  . Frequency of Communication with Friends and Family: More than three times a week  . Frequency of Social Gatherings with Friends and Family: More than three times a week  . Attends Religious Services: 1 to 4 times per year  . Active Member of  Clubs or Organizations: Yes  . Attends Archivist Meetings: 1 to 4 times per year  . Marital Status: Widowed    Family History  Problem Relation Age of Onset  . Arthritis Mother        died in her 91's  . Arthritis Father        died in [place].  Marland Kitchen CAD Brother        deceased  . CAD Brother  deceased  . CAD Sister        deceased    Review of Systems  Constitutional: Negative for chills and fever.  Respiratory: Negative for cough, shortness of breath and wheezing.   Cardiovascular: Positive for palpitations (occ). Negative for chest pain and leg swelling.  Neurological: Positive for dizziness (occ - uses meclizine). Negative for light-headedness and headaches.       Objective:   Vitals:   02/17/20 0923  BP: 138/72  Pulse: 64  Temp: 97.8 F (36.6 C)  SpO2: 97%   BP Readings from Last 3 Encounters:  02/17/20 138/72  02/17/20 138/72  09/29/19 116/66   Wt Readings from Last 3 Encounters:  02/17/20 164 lb 14.5 oz (74.8 kg)  02/17/20 164 lb 14.4 oz (74.8 kg)  09/29/19 166 lb (75.3 kg)   Body mass index is 29.21 kg/m.   Physical Exam    Constitutional: Appears well-developed and well-nourished. No distress.  HENT:  Head: Normocephalic and atraumatic.  Neck: Neck supple. No tracheal deviation present. No thyromegaly present.  No cervical lymphadenopathy Cardiovascular: Normal rate, irregular rhythm and normal heart sounds.   No murmur heard. No carotid bruit .  No edema Pulmonary/Chest: Effort normal and breath sounds normal. No respiratory distress. No has no wheezes. No rales.  Skin: Skin is warm and dry. Not diaphoretic.  Psychiatric: Normal mood and affect. Behavior is normal.    Diabetic Foot Exam - Simple   Simple Foot Form Diabetic Foot exam was performed with the following findings: Yes 02/17/2020  9:47 AM  Visual Inspection No deformities, no ulcerations, no other skin breakdown bilaterally: Yes Sensation Testing Intact to touch and  monofilament testing bilaterally: Yes Pulse Check Posterior Tibialis and Dorsalis pulse intact bilaterally: Yes Comments      Assessment & Plan:    See Problem List for Assessment and Plan of chronic medical problems.    This visit occurred during the SARS-CoV-2 public health emergency.  Safety protocols were in place, including screening questions prior to the visit, additional usage of staff PPE, and extensive cleaning of exam room while observing appropriate contact time as indicated for disinfecting solutions.

## 2020-02-17 ENCOUNTER — Ambulatory Visit (INDEPENDENT_AMBULATORY_CARE_PROVIDER_SITE_OTHER): Payer: Medicare Other | Admitting: General Practice

## 2020-02-17 ENCOUNTER — Other Ambulatory Visit: Payer: Self-pay

## 2020-02-17 ENCOUNTER — Ambulatory Visit (INDEPENDENT_AMBULATORY_CARE_PROVIDER_SITE_OTHER): Payer: Medicare Other

## 2020-02-17 ENCOUNTER — Ambulatory Visit (INDEPENDENT_AMBULATORY_CARE_PROVIDER_SITE_OTHER): Payer: Medicare Other | Admitting: Internal Medicine

## 2020-02-17 VITALS — BP 138/72 | HR 64 | Temp 97.8°F | Wt 164.9 lb

## 2020-02-17 VITALS — BP 138/72 | HR 64 | Temp 97.8°F | Ht 63.0 in | Wt 164.9 lb

## 2020-02-17 DIAGNOSIS — Z8673 Personal history of transient ischemic attack (TIA), and cerebral infarction without residual deficits: Secondary | ICD-10-CM

## 2020-02-17 DIAGNOSIS — I2510 Atherosclerotic heart disease of native coronary artery without angina pectoris: Secondary | ICD-10-CM

## 2020-02-17 DIAGNOSIS — I4891 Unspecified atrial fibrillation: Secondary | ICD-10-CM

## 2020-02-17 DIAGNOSIS — E538 Deficiency of other specified B group vitamins: Secondary | ICD-10-CM | POA: Diagnosis not present

## 2020-02-17 DIAGNOSIS — Z7901 Long term (current) use of anticoagulants: Secondary | ICD-10-CM

## 2020-02-17 DIAGNOSIS — G6289 Other specified polyneuropathies: Secondary | ICD-10-CM

## 2020-02-17 DIAGNOSIS — Z0000 Encounter for general adult medical examination without abnormal findings: Secondary | ICD-10-CM

## 2020-02-17 DIAGNOSIS — E782 Mixed hyperlipidemia: Secondary | ICD-10-CM

## 2020-02-17 DIAGNOSIS — I10 Essential (primary) hypertension: Secondary | ICD-10-CM

## 2020-02-17 DIAGNOSIS — E119 Type 2 diabetes mellitus without complications: Secondary | ICD-10-CM

## 2020-02-17 LAB — POCT INR: INR: 2.5 (ref 2.0–3.0)

## 2020-02-17 NOTE — Assessment & Plan Note (Signed)
Chronic Follows with cardio On warfarin, metoprolol Cbc, cmp inr checked today at coumadin clinic here - 2.5

## 2020-02-17 NOTE — Progress Notes (Signed)
Subjective:   PAULETT KAUFHOLD is a 86 y.o. female who presents for Medicare Annual (Subsequent) preventive examination.  Review of Systems    No ROS. Medicare Wellness Visit Cardiac Risk Factors include: advanced age (>31men, >24 women);diabetes mellitus;dyslipidemia;hypertension     Objective:    Today's Vitals   02/17/20 0936  BP: 138/72  Pulse: 64  Temp: 97.8 F (36.6 C)  SpO2: 97%  Weight: 164 lb 14.5 oz (74.8 kg)  Height: 5\' 3"  (1.6 m)   Body mass index is 29.21 kg/m.  Advanced Directives 02/17/2020 05/15/2017 11/27/2016  Does Patient Have a Medical Advance Directive? Yes No Yes  Type of Advance Directive - - Living will  Does patient want to make changes to medical advance directive? No - Patient declined - No - Patient declined  Would patient like information on creating a medical advance directive? - Yes (ED - Information included in AVS) -    Current Medications (verified) Outpatient Encounter Medications as of 02/17/2020  Medication Sig  . acetaminophen (TYLENOL) 500 MG tablet Take 500 mg by mouth every 6 (six) hours as needed.  Marland Kitchen aspirin 81 MG tablet Take 81 mg by mouth daily.    Marland Kitchen azelastine (ASTELIN) 0.1 % nasal spray Place 2 sprays into the nose 2 (two) times daily as needed for rhinitis or allergies.  Marland Kitchen b complex vitamins tablet Take 1 tablet by mouth daily.  Marland Kitchen diltiazem (CARDIZEM CD) 120 MG 24 hr capsule TAKE ONE CAPSULE BY MOUTH NIGHTLY AT BEDTIME  . Fexofenadine HCl (ALLEGRA ALLERGY PO) Take 1 tablet by mouth daily.   . furosemide (LASIX) 20 MG tablet TAKE ONE TABLET BY MOUTH DAILY  . isosorbide mononitrate (IMDUR) 30 MG 24 hr tablet TAKE 1 TABLET (30 MG TOTAL) BY MOUTH AT BEDTIME.  Marland Kitchen lisinopril (ZESTRIL) 10 MG tablet TAKE 1 TABLET BY MOUTH EVERY DAY  . meclizine (ANTIVERT) 25 MG tablet Take 1 tablet (25 mg total) by mouth 3 (three) times daily as needed for dizziness.  . metoprolol succinate (TOPROL-XL) 100 MG 24 hr tablet Take 1 tablet (100 mg total) by  mouth daily. Take with or immediately following a meal.  . Multiple Vitamin (MULTIVITAMIN WITH MINERALS) TABS tablet Take 1 tablet by mouth daily.  . nitroGLYCERIN (NITROSTAT) 0.4 MG SL tablet PLACE 1 TABLET UNDER TONGUE EVERY 5 MINUTES AS NEEDED FOR CHEST PAIN  . potassium chloride (MICRO-K) 10 MEQ CR capsule TAKE 1 CAPSULE (10 MEQ TOTAL) BY MOUTH DAILY.  . rosuvastatin (CRESTOR) 10 MG tablet TAKE 1 TABLET BY MOUTH EVERY DAY  . sodium chloride (OCEAN) 0.65 % SOLN nasal spray Place 1 spray into both nostrils as needed for congestion.  Marland Kitchen tobramycin (TOBREX) 0.3 % ophthalmic solution Place 1 drop into both eyes daily as needed.  . warfarin (COUMADIN) 1 MG tablet TAKE 2 TABLETS ON FRIDAYS OR AS DIRECTED BY ANTICOAGULATION CLINIC  . warfarin (COUMADIN) 2.5 MG tablet Take 1 tablet daily or take as directed by anticoagulation clinic   No facility-administered encounter medications on file as of 02/17/2020.    Allergies (verified) Betadine [povidone iodine] and Methocarbamol   History: Past Medical History:  Diagnosis Date  . Atrial fibrillation ([place])    a. chronic anticoag, failed prior DCCV;  b. 03/2010 Echo: EF 55-65%, mod MR, mildly to mod dil LA, mod dil RA, mild to mod MR.  Marland Kitchen CAD (coronary artery disease)    a. BMS to RCA 96';  b. Stent PDA and OM DES 2006  .  CAROTID ARTERY DISEASE    a. 11/2011 Carotid U/S:  0-39% bilat dzs.  Marland Kitchen HTN (hypertension)   . Hyperlipidemia   . LBBB (left bundle branch block)   . Macular degeneration, wet ([place]) 10/2012 dx   L>R, follows with retinal specialist->injections monthly.  . Obesity   . Vertigo    Past Surgical History:  Procedure Laterality Date  . [place], 2006 x2   Drug-eluting stent placement to the PDA, drug-eluting stent  placement to the first obtuse  marginal, StarClose closure of the right common femoral arteriotomy site. Successful drug-eluting stent  placement in  both the posterior descending artery and the obtuse  marginal. The lesion was directly stented using a 2.5 x 16 mm Taxus deployed at 14 atmospheres.   Marland Kitchen EYE SURGERY     Tear duct stent   Family History  Problem Relation Age of Onset  . Arthritis Mother        died in her 59's  . Arthritis Father        died in [place].  Marland Kitchen CAD Brother        deceased  . CAD Brother        deceased  . CAD Sister        deceased   Social History   Socioeconomic History  . Marital status: Widowed    Spouse name: Not on file  . Number of children: Not on file  . Years of education: Not on file  . Highest education level: Not on file  Occupational History  . Not on file  Tobacco Use  . Smoking status: Never Smoker  . Smokeless tobacco: Never Used  Vaping Use  . Vaping Use: Never used  Substance and Sexual Activity  . Alcohol use: No  . Drug use: No  . Sexual activity: Not on file  Other Topics Concern  . Not on file  Social History Narrative   Lives in [place] by herself.  She is active around the house w/o symptoms or limitations.   Social Determinants of Health   Financial Resource Strain: Low Risk   . Difficulty of Paying Living Expenses: Not hard at all  Food Insecurity: No Food Insecurity  . Worried About Charity fundraiser in the Last Year: Never true  . Ran Out of Food in the Last Year: Never true  Transportation Needs: No Transportation Needs  . Lack of Transportation (Medical): No  . Lack of Transportation (Non-Medical): No  Physical Activity: Sufficiently Active  . Days of Exercise per Week: 5 days  . Minutes of Exercise per Session: 30 min  Stress: Stress Concern Present  . Feeling of Stress : Rather much  Social Connections: Moderately Integrated  . Frequency of Communication with Friends and Family: More than three times a week  . Frequency of Social Gatherings with Friends and Family: More than three times a week  . Attends Religious Services: 1 to 4 times per year  . Active Member of Clubs or Organizations: Yes  . Attends  Archivist Meetings: 1 to 4 times per year  . Marital Status: Widowed    Tobacco Counseling Counseling given: Not Answered   Clinical Intake:  Pre-visit preparation completed: Yes  Pain : No/denies pain     BMI - recorded: 29.21 Nutritional Status: BMI 25 -29 Overweight Nutritional Risks: None Diabetes: Yes CBG done?: No Did pt. bring in CBG monitor from home?: No  How often do you need to have  someone help you when you read instructions, pamphlets, or other written materials from your doctor or pharmacy?: 1 - Never What is the last grade level you completed in school?: HSG  Diabetic? yes  Interpreter Needed?: No  Information entered by :: Hasheem Voland N. Mckenna Gamm, LPN   Activities of Daily Living In your present state of health, do you have any difficulty performing the following activities: 02/17/2020  Hearing? N  Vision? N  Difficulty concentrating or making decisions? Y  Comment sometimes  Walking or climbing stairs? N  Dressing or bathing? N  Doing errands, shopping? Y  Comment does not drive anymore  Preparing Food and eating ? N  Using the Toilet? N  In the past six months, have you accidently leaked urine? N  Do you have problems with loss of bowel control? N  Managing your Medications? N  Managing your Finances? N  Housekeeping or managing your Housekeeping? N  Some recent data might be hidden    Patient Care Team: Binnie Rail, MD as PCP - General (Internal Medicine) Josue Hector, MD as PCP - Cardiology (Cardiology) Josue Hector, MD (Cardiology) Netta Cedars, MD (Orthopedic Surgery) Sherlynn Stalls, MD (Ophthalmology) Charlton Haws, [facility] as Pharmacist (Pharmacist)  Indicate any recent Medical Services you may have received from other than [facility] providers in the past year (date may be approximate).     Assessment:   This is a routine wellness examination for Jin.  Hearing/Vision screen No exam data present  Dietary  issues and exercise activities discussed: Current Exercise Habits: Home exercise routine, Type of exercise: Other - see comments (active around the house), Time (Minutes): 30, Frequency (Times/Week): 5, Weekly Exercise (Minutes/Week): 150, Intensity: Mild, Exercise limited by: cardiac condition(s)  Goals    .  Patient Stated (pt-stated)      My goal is to continue to be active around the house and increase my water intake.    Marland Kitchen  Pharmacy Care Plan      Current Barriers:  . Chronic Disease Management support, education, and care coordination needs related to Atrial Fibrillation, CAD, HTN, HLD, and prediabetes  Pharmacist Clinical Goal(s):  Marland Kitchen Maintain blood pressure between 100/60 and 140/90 . Maintain heart rate between 60 and 100 . Maintain LDL (bad cholesterol) less than 100 (currently 84) . Maintain INR between 2 and 2.5   Interventions: . Comprehensive medication review performed. . Discussed possibility of switching warfarin to Eliquis or Xarelto for less blood monitoring, no food/drug interactions, and lower risk of bleeding . Discuss need to continue furosemide with Dr Johnsie Cancel . Discussed how metoprolol and diltiazem affect blood pressure and heart rate . Get a new bottle of Nitrostat every 6-12 months  . Check blood pressure when feeling dizzy. If top number is less than 100 contact me or Dr Quay Burow  Patient Self Care Activities:  . Self administers medications as prescribed, Calls pharmacy for medication refills, and Calls provider office for new concerns or questions  Initial goal documentation      Depression Screen PHQ 2/9 Scores 02/17/2020 08/15/2019 05/15/2017 02/07/2017 09/22/2015 09/21/2014 05/28/2013  PHQ - 2 Score 0 0 0 0 0 0 0  PHQ- 9 Score - - 0 - - - -    Fall Risk Fall Risk  02/17/2020 08/15/2019 05/02/2018 05/15/2017 02/07/2017  Falls in the past year? 1 0 0 Yes Yes  Comment - - Emmi Telephone Survey: data to providers prior to load - -  Number falls in past yr: 0  0 - 1  2 or more  Comment - - - - -  Injury with Fall? 0 0 - - No  Risk for fall due to : Impaired balance/gait - - - History of fall(s);Impaired balance/gait  Risk for fall due to: Comment due to vertigo - - - -  Follow up Falls evaluation completed - - - -    Any stairs in or around the home? No  If so, are there any without handrails? No  Home free of loose throw rugs in walkways, pet beds, electrical cords, etc? Yes  Adequate lighting in your home to reduce risk of falls? Yes   ASSISTIVE DEVICES UTILIZED TO PREVENT FALLS:  Life alert? No  Use of a cane, walker or w/c? No  Grab bars in the bathroom? Yes  Shower chair or bench in shower? Yes  Elevated toilet seat or a handicapped toilet? Yes   TIMED UP AND GO:  Was the test performed? No .  Length of time to ambulate 10 feet: 0 sec.   Gait steady and fast without use of assistive device  Cognitive Function: MMSE - Mini Mental State Exam 05/15/2017  Orientation to time 5  Orientation to Place 5  Registration 3  Attention/ Calculation 5  Recall 2  Language- name 2 objects 2  Language- repeat 1  Language- follow 3 step command 3  Language- read & follow direction 1  Write a sentence 1  Copy design 1  Total score 29     6CIT Screen 02/17/2020  What Year? 0 points  What month? 0 points  What time? 0 points  Count back from 20 0 points  Months in reverse 0 points  Repeat phrase 2 points  Total Score 2    Immunizations Immunization History  Administered Date(s) Administered  . Influenza Split 03/22/2012  . Influenza Whole 03/21/2011  . Influenza, High Dose Seasonal PF 04/06/2015, 02/18/2016, 03/02/2017, 03/08/2018  . Influenza,inj,quad, With Preservative 02/26/2019  . Influenza-Unspecified 03/12/2013  . Pneumococcal Conjugate-13 09/21/2014  . Pneumococcal Polysaccharide-23 05/28/2013  . Zoster 06/12/2009    TDAP status: Due, Education has been provided regarding the importance of this vaccine. Advised may receive  this vaccine at local pharmacy or Health Dept. Aware to provide a copy of the vaccination record if obtained from local pharmacy or Health Dept. Verbalized acceptance and understanding. Flu Vaccine status: Up to date Pneumococcal vaccine status: Up to date Covid-19 vaccine status: Completed vaccines  Qualifies for Shingles Vaccine? Yes   Zostavax completed No   Shingrix Completed?: No.    Education has been provided regarding the importance of this vaccine. Patient has been advised to call insurance company to determine out of pocket expense if they have not yet received this vaccine. Advised may also receive vaccine at local pharmacy or Health Dept. Verbalized acceptance and understanding.  Screening Tests Health Maintenance  Topic Date Due  . OPHTHALMOLOGY EXAM  Never done  . COVID-19 Vaccine (1) Never done  . TETANUS/TDAP  Never done  . INFLUENZA VACCINE  01/11/2020  . HEMOGLOBIN A1C  02/15/2020  . FOOT EXAM  02/16/2021  . DEXA SCAN  Completed  . PNA vac Low Risk Adult  Completed    Health Maintenance  Health Maintenance Due  Topic Date Due  . OPHTHALMOLOGY EXAM  Never done  . COVID-19 Vaccine (1) Never done  . TETANUS/TDAP  Never done  . INFLUENZA VACCINE  01/11/2020  . HEMOGLOBIN A1C  02/15/2020    Colorectal cancer screening:  No longer required.  Mammogram status: No longer required.   Lung Cancer Screening: (Low Dose CT Chest recommended if Age 50-80 years, 30 pack-year currently smoking OR have quit w/in 15years.) does not qualify.   Lung Cancer Screening Referral: no  Additional Screening:  Hepatitis C Screening: does not qualify; Completed: no  Vision Screening: Recommended annual ophthalmology exams for early detection of glaucoma and other disorders of the eye. Is the patient up to date with their annual eye exam?  Yes  Who is the provider or what is the name of the office in which the patient attends annual eye exams? Sherlynn Stalls, MD If pt is not  established with a provider, would they like to be referred to a provider to establish care? No .   Dental Screening: Recommended annual dental exams for proper oral hygiene  Community Resource Referral / Chronic Care Management: CRR required this visit?  No   CCM required this visit?  No      Plan:     I have personally reviewed and noted the following in the patient's chart:   . Medical and social history . Use of alcohol, tobacco or illicit drugs  . Current medications and supplements . Functional ability and status . Nutritional status . Physical activity . Advanced directives . List of other physicians . Hospitalizations, surgeries, and ER visits in previous 12 months . Vitals . Screenings to include cognitive, depression, and falls . Referrals and appointments  In addition, I have reviewed and discussed with patient certain preventive protocols, quality metrics, and best practice recommendations. A written personalized care plan for preventive services as well as general preventive health recommendations were provided to patient.     Sheral Flow, LPN   [id number]/02/[id number]   Nurse Notes: n/a

## 2020-02-17 NOTE — Patient Instructions (Addendum)
Judy [name] , Thank you for taking time to come for your Medicare Wellness Visit. I appreciate your ongoing commitment to your health goals. Please review the following plan we discussed and let me know if I can assist you in the future.   Screening recommendations/referrals: Colonoscopy: never done Mammogram: never done Bone Density: 09/21/2014; patient reported Recommended yearly ophthalmology/optometry visit for glaucoma screening and checkup Recommended yearly dental visit for hygiene and checkup  Vaccinations: Influenza vaccine: up to date Pneumococcal vaccine: up to date Tdap vaccine: never done Shingles vaccine: never done   Covid-19: up to date; will call back with dates  Advanced directives: Please bring a copy of your health care power of attorney and living will to the office at your convenience.  Conditions/risks identified: Yes; Reviewed health maintenance screenings with patient today and relevant education, vaccines, and/or referrals were provided. Please continue to do your personal lifestyle choices by: daily care of teeth and gums, regular physical activity (goal should be 5 days a week for 30 minutes), eat a healthy diet, avoid tobacco and drug use, limiting any alcohol intake, taking a low-dose aspirin (if not allergic or have been advised by your provider otherwise) and taking vitamins and minerals as recommended by your provider. Continue doing brain stimulating activities (puzzles, reading, adult coloring books, staying active) to keep memory sharp. Continue to eat heart healthy diet (full of fruits, vegetables, whole grains, lean protein, water--limit salt, fat, and sugar intake) and increase physical activity as tolerated.  Next appointment: Please schedule your next Medicare Wellness Visit with your Nurse Health Advisor in 1 year.  Preventive Care 16 Years and Older, Female Preventive care refers to lifestyle choices and visits with your health care provider that can  promote health and wellness. What does preventive care include?  A yearly physical exam. This is also called an annual well check.  Dental exams once or twice a year.  Routine eye exams. Ask your health care provider how often you should have your eyes checked.  Personal lifestyle choices, including:  Daily care of your teeth and gums.  Regular physical activity.  Eating a healthy diet.  Avoiding tobacco and drug use.  Limiting alcohol use.  Practicing safe sex.  Taking low-dose aspirin every day.  Taking vitamin and mineral supplements as recommended by your health care provider. What happens during an annual well check? The services and screenings done by your health care provider during your annual well check will depend on your age, overall health, lifestyle risk factors, and family history of disease. Counseling  Your health care provider may ask you questions about your:  Alcohol use.  Tobacco use.  Drug use.  Emotional well-being.  Home and relationship well-being.  Sexual activity.  Eating habits.  History of falls.  Memory and ability to understand (cognition).  Work and work Statistician.  Reproductive health. Screening  You may have the following tests or measurements:  Height, weight, and BMI.  Blood pressure.  Lipid and cholesterol levels. These may be checked every 5 years, or more frequently if you are over 28 years old.  Skin check.  Lung cancer screening. You may have this screening every year starting at age 28 if you have a 30-pack-year history of smoking and currently smoke or have quit within the past 15 years.  Fecal occult blood test (FOBT) of the stool. You may have this test every year starting at age 9.  Flexible sigmoidoscopy or colonoscopy. You may have a sigmoidoscopy every 5  years or a colonoscopy every 10 years starting at age 73.  Hepatitis C blood test.  Hepatitis B blood test.  Sexually transmitted disease  (STD) testing.  Diabetes screening. This is done by checking your blood sugar (glucose) after you have not eaten for a while (fasting). You may have this done every 1-3 years.  Bone density scan. This is done to screen for osteoporosis. You may have this done starting at age 34.  Mammogram. This may be done every 1-2 years. Talk to your health care provider about how often you should have regular mammograms. Talk with your health care provider about your test results, treatment options, and if necessary, the need for more tests. Vaccines  Your health care provider may recommend certain vaccines, such as:  Influenza vaccine. This is recommended every year.  Tetanus, diphtheria, and acellular pertussis (Tdap, Td) vaccine. You may need a Td booster every 10 years.  Zoster vaccine. You may need this after age 60.  Pneumococcal 13-valent conjugate (PCV13) vaccine. One dose is recommended after age 74.  Pneumococcal polysaccharide (PPSV23) vaccine. One dose is recommended after age 76. Talk to your health care provider about which screenings and vaccines you need and how often you need them. This information is not intended to replace advice given to you by your health care provider. Make sure you discuss any questions you have with your health care provider. Document Released: 06/25/2015 Document Revised: 02/16/2016 Document Reviewed: 03/30/2015 Elsevier Interactive Patient Education  2017 [place] Prevention in the Home Falls can cause injuries. They can happen to people of all ages. There are many things you can do to make your home safe and to help prevent falls. What can I do on the outside of my home?  Regularly fix the edges of walkways and driveways and fix any cracks.  Remove anything that might make you trip as you walk through a door, such as a raised step or threshold.  Trim any bushes or trees on the path to your home.  Use bright outdoor lighting.  Clear any  walking paths of anything that might make someone trip, such as rocks or tools.  Regularly check to see if handrails are loose or broken. Make sure that both sides of any steps have handrails.  Any raised decks and porches should have guardrails on the edges.  Have any leaves, snow, or ice cleared regularly.  Use sand or salt on walking paths during winter.  Clean up any spills in your garage right away. This includes oil or grease spills. What can I do in the bathroom?  Use night lights.  Install grab bars by the toilet and in the tub and shower. Do not use towel bars as grab bars.  Use non-skid mats or decals in the tub or shower.  If you need to sit down in the shower, use a plastic, non-slip stool.  Keep the floor dry. Clean up any water that spills on the floor as soon as it happens.  Remove soap buildup in the tub or shower regularly.  Attach bath mats securely with double-sided non-slip rug tape.  Do not have throw rugs and other things on the floor that can make you trip. What can I do in the bedroom?  Use night lights.  Make sure that you have a light by your bed that is easy to reach.  Do not use any sheets or blankets that are too big for your bed. They should not hang  down onto the floor.  Have a firm chair that has side arms. You can use this for support while you get dressed.  Do not have throw rugs and other things on the floor that can make you trip. What can I do in the kitchen?  Clean up any spills right away.  Avoid walking on wet floors.  Keep items that you use a lot in easy-to-reach places.  If you need to reach something above you, use a strong step stool that has a grab bar.  Keep electrical cords out of the way.  Do not use floor polish or wax that makes floors slippery. If you must use wax, use non-skid floor wax.  Do not have throw rugs and other things on the floor that can make you trip. What can I do with my stairs?  Do not leave  any items on the stairs.  Make sure that there are handrails on both sides of the stairs and use them. Fix handrails that are broken or loose. Make sure that handrails are as long as the stairways.  Check any carpeting to make sure that it is firmly attached to the stairs. Fix any carpet that is loose or worn.  Avoid having throw rugs at the top or bottom of the stairs. If you do have throw rugs, attach them to the floor with carpet tape.  Make sure that you have a light switch at the top of the stairs and the bottom of the stairs. If you do not have them, ask someone to add them for you. What else can I do to help prevent falls?  Wear shoes that:  Do not have high heels.  Have rubber bottoms.  Are comfortable and fit you well.  Are closed at the toe. Do not wear sandals.  If you use a stepladder:  Make sure that it is fully opened. Do not climb a closed stepladder.  Make sure that both sides of the stepladder are locked into place.  Ask someone to hold it for you, if possible.  Clearly mark and make sure that you can see:  Any grab bars or handrails.  First and last steps.  Where the edge of each step is.  Use tools that help you move around (mobility aids) if they are needed. These include:  Canes.  Walkers.  Scooters.  Crutches.  Turn on the lights when you go into a dark area. Replace any light bulbs as soon as they burn out.  Set up your furniture so you have a clear path. Avoid moving your furniture around.  If any of your floors are uneven, fix them.  If there are any pets around you, be aware of where they are.  Review your medicines with your doctor. Some medicines can make you feel dizzy. This can increase your chance of falling. Ask your doctor what other things that you can do to help prevent falls. This information is not intended to replace advice given to you by your health care provider. Make sure you discuss any questions you have with your  health care provider. Document Released: 03/25/2009 Document Revised: 11/04/2015 Document Reviewed: 07/03/2014 Elsevier Interactive Patient Education  2017 [employer].

## 2020-02-17 NOTE — Assessment & Plan Note (Addendum)
Chronic Check lipids Continue daily statin Regular exercise and healthy diet encouraged

## 2020-02-17 NOTE — Assessment & Plan Note (Signed)
Chronic Taking warfarin, ASA 81 mg, crestor BP controlled Sugars controlled

## 2020-02-17 NOTE — Assessment & Plan Note (Signed)
Chronic Intermittent N/T - mild overall Not on medication Sugars controlled monitor

## 2020-02-17 NOTE — Assessment & Plan Note (Signed)
Chronic Well controlled Diet controlled Check a1c Low sugar / carb diet Encouraged as much activity as possible

## 2020-02-17 NOTE — Patient Instructions (Addendum)
Pre visit review using our clinic review tool, if applicable. No additional management support is needed unless otherwise documented below in the visit note.  Skip dosage today for dental procedure tomorrow.  On Thursday and Friday take 2 tablets and then continue to take 1 (2.5 mg) tablet daily except take 1 (1 mg tablets ) on Fridays. Re-check in 4 weeks.

## 2020-02-17 NOTE — Assessment & Plan Note (Signed)
Chronic Taking B complex daily

## 2020-02-17 NOTE — Progress Notes (Signed)
Agree with management.  Judy [name] [name], MD  

## 2020-02-17 NOTE — Assessment & Plan Note (Signed)
Chronic BP well controlled Current regimen effective and well tolerated Continue current medications at current doses cmp  

## 2020-02-18 ENCOUNTER — Telehealth: Payer: Self-pay | Admitting: Internal Medicine

## 2020-02-18 LAB — CBC WITH DIFFERENTIAL/PLATELET
Absolute Monocytes: 630 cells/uL (ref 200–950)
Basophils Absolute: 49 cells/uL (ref 0–200)
Basophils Relative: 0.7 %
Eosinophils Absolute: 231 cells/uL (ref 15–500)
Eosinophils Relative: 3.3 %
HCT: 40.6 % (ref 35.0–45.0)
Hemoglobin: 13.5 g/dL (ref 11.7–15.5)
Lymphs Abs: 2051 cells/uL (ref 850–3900)
MCH: 30 pg (ref 27.0–33.0)
MCHC: 33.3 g/dL (ref 32.0–36.0)
MCV: 90.2 fL (ref 80.0–100.0)
MPV: 10.6 fL (ref 7.5–12.5)
Monocytes Relative: 9 %
Neutro Abs: 4039 cells/uL (ref 1500–7800)
Neutrophils Relative %: 57.7 %
Platelets: 211 10*3/uL (ref 140–400)
RBC: 4.5 10*6/uL (ref 3.80–5.10)
RDW: 13.1 % (ref 11.0–15.0)
Total Lymphocyte: 29.3 %
WBC: 7 10*3/uL (ref 3.8–10.8)

## 2020-02-18 LAB — HEMOGLOBIN A1C
Hgb A1c MFr Bld: 5.8 % of total Hgb — ABNORMAL HIGH (ref <5.7)
Mean Plasma Glucose: 120 (calc)
eAG (mmol/L): 6.6 (calc)

## 2020-02-18 LAB — LIPID PANEL
Cholesterol: 178 mg/dL (ref <200)
HDL: 50 mg/dL (ref >=50)
LDL Cholesterol (Calc): 98 mg/dL (calc)
Non-HDL Cholesterol (Calc): 128 mg/dL (calc) (ref <130)
Total CHOL/HDL Ratio: 3.6 (calc) (ref <5.0)
Triglycerides: 199 mg/dL — ABNORMAL HIGH (ref <150)

## 2020-02-18 LAB — COMPLETE METABOLIC PANEL WITH GFR
AG Ratio: 1.8 (calc) (ref 1.0–2.5)
ALT: 15 U/L (ref 6–29)
AST: 22 U/L (ref 10–35)
Albumin: 4.3 g/dL (ref 3.6–5.1)
Alkaline phosphatase (APISO): 67 U/L (ref 37–153)
BUN/Creatinine Ratio: 15 (calc) (ref 6–22)
BUN: 16 mg/dL (ref 7–25)
CO2: 25 mmol/L (ref 20–32)
Calcium: 9.9 mg/dL (ref 8.6–10.4)
Chloride: 109 mmol/L (ref 98–110)
Creat: 1.05 mg/dL — ABNORMAL HIGH (ref 0.60–0.88)
GFR, Est African American: 55 mL/min/{1.73_m2} — ABNORMAL LOW (ref >=60)
GFR, Est Non African American: 48 mL/min/{1.73_m2} — ABNORMAL LOW (ref >=60)
Globulin: 2.4 g/dL (calc) (ref 1.9–3.7)
Glucose, Bld: 106 mg/dL — ABNORMAL HIGH (ref 65–99)
Potassium: 5.1 mmol/L (ref 3.5–5.3)
Sodium: 143 mmol/L (ref 135–146)
Total Bilirubin: 0.3 mg/dL (ref 0.2–1.2)
Total Protein: 6.7 g/dL (ref 6.1–8.1)

## 2020-02-18 LAB — TSH: TSH: 2.29 mIU/L (ref 0.40–4.50)

## 2020-02-18 NOTE — Telephone Encounter (Signed)
Updated immunization record.Marland KitchenJohny [name]

## 2020-02-18 NOTE — Telephone Encounter (Signed)
    Patient's daughter calling to report she had [place] vaccine 01/20 and 07/23/19

## 2020-02-24 DIAGNOSIS — H35372 Puckering of macula, left eye: Secondary | ICD-10-CM | POA: Diagnosis not present

## 2020-02-24 DIAGNOSIS — H43813 Vitreous degeneration, bilateral: Secondary | ICD-10-CM | POA: Diagnosis not present

## 2020-02-24 DIAGNOSIS — H353231 Exudative age-related macular degeneration, bilateral, with active choroidal neovascularization: Secondary | ICD-10-CM | POA: Diagnosis not present

## 2020-02-24 DIAGNOSIS — H43393 Other vitreous opacities, bilateral: Secondary | ICD-10-CM | POA: Diagnosis not present

## 2020-02-27 ENCOUNTER — Other Ambulatory Visit: Payer: Self-pay | Admitting: Cardiovascular Disease

## 2020-03-13 DIAGNOSIS — Z23 Encounter for immunization: Secondary | ICD-10-CM | POA: Diagnosis not present

## 2020-03-16 ENCOUNTER — Ambulatory Visit (INDEPENDENT_AMBULATORY_CARE_PROVIDER_SITE_OTHER): Payer: Medicare Other | Admitting: General Practice

## 2020-03-16 ENCOUNTER — Other Ambulatory Visit: Payer: Self-pay

## 2020-03-16 DIAGNOSIS — I4891 Unspecified atrial fibrillation: Secondary | ICD-10-CM | POA: Diagnosis not present

## 2020-03-16 DIAGNOSIS — Z7901 Long term (current) use of anticoagulants: Secondary | ICD-10-CM | POA: Diagnosis not present

## 2020-03-16 LAB — POCT INR: INR: 1.9 — AB (ref 2.0–3.0)

## 2020-03-16 NOTE — Patient Instructions (Addendum)
Pre visit review using our clinic review tool, if applicable. No additional management support is needed unless otherwise documented below in the visit note.  Take 3.5 mg today and then continue to take 1 (2.5 mg) tablet daily except take 1 (1 mg tablets ) on Fridays. Re-check in 4 weeks.

## 2020-03-17 NOTE — Progress Notes (Signed)
Agree with management.  Judy Coyt J Jillyan Plitt, MD  

## 2020-03-22 ENCOUNTER — Other Ambulatory Visit: Payer: Self-pay | Admitting: Internal Medicine

## 2020-03-22 DIAGNOSIS — Z7901 Long term (current) use of anticoagulants: Secondary | ICD-10-CM

## 2020-04-13 ENCOUNTER — Other Ambulatory Visit: Payer: Self-pay

## 2020-04-13 ENCOUNTER — Ambulatory Visit (INDEPENDENT_AMBULATORY_CARE_PROVIDER_SITE_OTHER): Payer: Medicare Other | Admitting: General Practice

## 2020-04-13 DIAGNOSIS — I4891 Unspecified atrial fibrillation: Secondary | ICD-10-CM | POA: Diagnosis not present

## 2020-04-13 DIAGNOSIS — Z23 Encounter for immunization: Secondary | ICD-10-CM

## 2020-04-13 DIAGNOSIS — Z7901 Long term (current) use of anticoagulants: Secondary | ICD-10-CM

## 2020-04-13 LAB — POCT INR: INR: 1.8 — AB (ref 2.0–3.0)

## 2020-04-13 NOTE — Patient Instructions (Signed)
Pre visit review using our clinic review tool, if applicable. No additional management support is needed unless otherwise documented below in the visit note. Take 3.5 mg today and then change dosage and take  1 (2.5 mg) tablet daily except take 2 (1 mg tablets ) on Fridays. Re-check in 4 weeks.

## 2020-04-13 NOTE — Progress Notes (Signed)
Agree with management.  Travarius Lange J Chantele Corado, MD  

## 2020-04-20 DIAGNOSIS — H353231 Exudative age-related macular degeneration, bilateral, with active choroidal neovascularization: Secondary | ICD-10-CM | POA: Diagnosis not present

## 2020-04-20 DIAGNOSIS — H43393 Other vitreous opacities, bilateral: Secondary | ICD-10-CM | POA: Diagnosis not present

## 2020-04-20 DIAGNOSIS — H35372 Puckering of macula, left eye: Secondary | ICD-10-CM | POA: Diagnosis not present

## 2020-04-20 DIAGNOSIS — H43813 Vitreous degeneration, bilateral: Secondary | ICD-10-CM | POA: Diagnosis not present

## 2020-05-11 ENCOUNTER — Ambulatory Visit (INDEPENDENT_AMBULATORY_CARE_PROVIDER_SITE_OTHER): Payer: Medicare Other | Admitting: General Practice

## 2020-05-11 ENCOUNTER — Other Ambulatory Visit: Payer: Self-pay

## 2020-05-11 DIAGNOSIS — Z7901 Long term (current) use of anticoagulants: Secondary | ICD-10-CM | POA: Diagnosis not present

## 2020-05-11 DIAGNOSIS — I4891 Unspecified atrial fibrillation: Secondary | ICD-10-CM

## 2020-05-11 LAB — POCT INR: INR: 2.6 (ref 2.0–3.0)

## 2020-05-11 NOTE — Patient Instructions (Addendum)
Pre visit review using our clinic review tool, if applicable. No additional management support is needed unless otherwise documented below in the visit note.  Take 2 mg today and then continue  dosage and take 1 (2.5 mg) tablet daily except take 2 (1 mg tablets ) on Fridays. Re-check in 4 weeks.

## 2020-05-12 NOTE — Progress Notes (Signed)
Agree with management.  Danniel Tones J Dillon Mcreynolds, MD  

## 2020-06-08 ENCOUNTER — Other Ambulatory Visit: Payer: Self-pay

## 2020-06-08 ENCOUNTER — Ambulatory Visit (INDEPENDENT_AMBULATORY_CARE_PROVIDER_SITE_OTHER): Payer: Medicare Other | Admitting: General Practice

## 2020-06-08 DIAGNOSIS — Z7901 Long term (current) use of anticoagulants: Secondary | ICD-10-CM | POA: Diagnosis not present

## 2020-06-08 DIAGNOSIS — I4891 Unspecified atrial fibrillation: Secondary | ICD-10-CM

## 2020-06-08 LAB — POCT INR: INR: 2.3 (ref 2.0–3.0)

## 2020-06-08 NOTE — Patient Instructions (Signed)
Pre visit review using our clinic review tool, if applicable. No additional management support is needed unless otherwise documented below in the visit note.  Continue  dosage and take 1 (2.5 mg) tablet daily except take 2 (1 mg tablets ) on Fridays. Re-check in 6 weeks.

## 2020-06-15 DIAGNOSIS — H353223 Exudative age-related macular degeneration, left eye, with inactive scar: Secondary | ICD-10-CM | POA: Diagnosis not present

## 2020-06-15 DIAGNOSIS — H353211 Exudative age-related macular degeneration, right eye, with active choroidal neovascularization: Secondary | ICD-10-CM | POA: Diagnosis not present

## 2020-06-15 DIAGNOSIS — H35372 Puckering of macula, left eye: Secondary | ICD-10-CM | POA: Diagnosis not present

## 2020-06-15 DIAGNOSIS — H35423 Microcystoid degeneration of retina, bilateral: Secondary | ICD-10-CM | POA: Diagnosis not present

## 2020-07-13 DIAGNOSIS — M79641 Pain in right hand: Secondary | ICD-10-CM | POA: Diagnosis not present

## 2020-07-13 DIAGNOSIS — M1711 Unilateral primary osteoarthritis, right knee: Secondary | ICD-10-CM | POA: Diagnosis not present

## 2020-07-13 DIAGNOSIS — M25561 Pain in right knee: Secondary | ICD-10-CM | POA: Diagnosis not present

## 2020-07-14 ENCOUNTER — Other Ambulatory Visit: Payer: Self-pay | Admitting: Internal Medicine

## 2020-07-14 ENCOUNTER — Other Ambulatory Visit: Payer: Self-pay | Admitting: Cardiovascular Disease

## 2020-07-14 DIAGNOSIS — Z7901 Long term (current) use of anticoagulants: Secondary | ICD-10-CM

## 2020-07-19 ENCOUNTER — Encounter: Payer: Self-pay | Admitting: Internal Medicine

## 2020-07-19 ENCOUNTER — Telehealth (INDEPENDENT_AMBULATORY_CARE_PROVIDER_SITE_OTHER): Payer: Medicare Other | Admitting: Internal Medicine

## 2020-07-19 ENCOUNTER — Other Ambulatory Visit: Payer: Self-pay

## 2020-07-19 DIAGNOSIS — U071 COVID-19: Secondary | ICD-10-CM | POA: Diagnosis not present

## 2020-07-19 NOTE — Assessment & Plan Note (Signed)
Acute She tested positive on a home test yesterday Symptoms consistent with Covid Discussed the importance of symptomatic treatment.  Her symptoms are mild so no further treatment necessary at this time Continue Mucinex and any other cold medications needed for symptom relief Advised to call if symptoms worsen, change or if they have any questions or concerns

## 2020-07-19 NOTE — Progress Notes (Signed)
Virtual Visit via telephone Note  I connected with Judy [name] on 07/19/20 at  1:30 PM EST by telephone and verified that I am speaking with the correct person using two identifiers.   I discussed the limitations of evaluation and management by telemedicine and the availability of in person appointments. The patient expressed understanding and agreed to proceed.  Present for the visit:  Myself, Dr Billey Gosling, Judy [name] and her daughter [name].  The patient is currently at home and I am in the office.    No referring provider.    History of Present Illness: She is here for an acute visit for cold symptoms.   Her symptoms started a few days ago.    She is experiencing nasal congestion, PND, cough from PND, joint pain and headaches.  She has some arthritis, but this joint pain is in multiple joints and just started.  She has tried taking mucinex   Possible covid contact last Wednesday - symptoms started a few days later.  She has been out several places in the past week.   She has had covid booster.  She tested positive with a home test yesterday.     Review of Systems  Constitutional: Negative for chills and fever.  HENT: Positive for congestion (with PND). Negative for ear pain, sinus pain and sore throat.   Respiratory: Positive for cough (PND). Negative for shortness of breath and wheezing.   Gastrointestinal: Negative for abdominal pain, diarrhea and nausea.  Musculoskeletal: Positive for joint pain (increased x few days).  Neurological: Positive for headaches.      Social History   Socioeconomic History  . Marital status: Widowed    Spouse name: Not on file  . Number of children: Not on file  . Years of education: Not on file  . Highest education level: Not on file  Occupational History  . Not on file  Tobacco Use  . Smoking status: Never Smoker  . Smokeless tobacco: Never Used  Vaping Use  . Vaping Use: Never used  Substance and Sexual Activity  . Alcohol  use: No  . Drug use: No  . Sexual activity: Not on file  Other Topics Concern  . Not on file  Social History Narrative   Lives in [place] by herself.  She is active around the house w/o symptoms or limitations.   Social Determinants of Health   Financial Resource Strain: Low Risk   . Difficulty of Paying Living Expenses: Not hard at all  Food Insecurity: No Food Insecurity  . Worried About Charity fundraiser in the Last Year: Never true  . Ran Out of Food in the Last Year: Never true  Transportation Needs: No Transportation Needs  . Lack of Transportation (Medical): No  . Lack of Transportation (Non-Medical): No  Physical Activity: Sufficiently Active  . Days of Exercise per Week: 5 days  . Minutes of Exercise per Session: 30 min  Stress: Stress Concern Present  . Feeling of Stress : Rather much  Social Connections: Moderately Integrated  . Frequency of Communication with Friends and Family: More than three times a week  . Frequency of Social Gatherings with Friends and Family: More than three times a week  . Attends Religious Services: 1 to 4 times per year  . Active Member of Clubs or Organizations: Yes  . Attends Archivist Meetings: 1 to 4 times per year  . Marital Status: Widowed     Observations/Objective:    Assessment  and Plan:  See Problem List for Assessment and Plan of chronic medical problems.   Follow Up Instructions:    I discussed the assessment and treatment plan with the patient. The patient was provided an opportunity to ask questions and all were answered. The patient agreed with the plan and demonstrated an understanding of the instructions.   The patient was advised to call back or seek an in-person evaluation if the symptoms worsen or if the condition fails to improve as anticipated.  Time spent on telephone call - 10 minutes   Binnie Rail, MD

## 2020-07-20 ENCOUNTER — Ambulatory Visit: Payer: Medicare Other

## 2020-07-26 ENCOUNTER — Telehealth: Payer: Self-pay | Admitting: Internal Medicine

## 2020-07-26 ENCOUNTER — Other Ambulatory Visit: Payer: Self-pay

## 2020-07-26 DIAGNOSIS — U071 COVID-19: Secondary | ICD-10-CM

## 2020-07-26 MED ORDER — BENZONATATE 100 MG PO CAPS: 100.0000 mg | ORAL_CAPSULE | Freq: Three times a day (TID) | ORAL | 0 refills | Status: DC | PRN

## 2020-07-26 NOTE — Telephone Encounter (Signed)
Spoke with patient's daughter. I gave her number to post covid clinic and also did referral for infusion. (I put order in under your name to sign since Quay Burow is out)  In the meantime are you able to call her in anything for cough? Daughter states she has been taking OTC Mucinex extra strength but she is coughing pretty bad.

## 2020-07-26 NOTE — Telephone Encounter (Signed)
I will send in [facility] but please stress to them to need to be seen in [place] somewhere soon. Please call to check on her tomorrow to see if she was able to be seen.

## 2020-07-26 NOTE — Telephone Encounter (Signed)
[facility]   Patient's daughter Hillary Bow called. States her mother tested positive for COVID on an at home test last Sunday. They already spoke to Dr. Quay Burow about her symptoms on Monday. She states that she would like to speak to Dr. Quay Burow about her mother's worsening symptoms. Her mother is coughing a lot more and she is concerned about this spreading to her lungs. She is also feeling very weak. Caller declined triage concerned that they may advise her to go to the ER or Urgent Care. She would like a message to be sent to Dr. Quay Burow requesting a call back. She believes her mother may be a candidate for the infusion. They are very concerned by her worsening symptoms from [place].   Please advise.

## 2020-07-27 ENCOUNTER — Telehealth: Payer: Medicare Other | Admitting: Family Medicine

## 2020-07-27 ENCOUNTER — Other Ambulatory Visit: Payer: Self-pay

## 2020-07-27 ENCOUNTER — Ambulatory Visit (HOSPITAL_COMMUNITY)
Admission: RE | Admit: 2020-07-27 | Discharge: 2020-07-27 | Disposition: A | Discharge status: Home / Self Care | Payer: Medicare Other | Source: Ambulatory Visit | Attending: Nurse Practitioner | Admitting: Nurse Practitioner

## 2020-07-27 ENCOUNTER — Ambulatory Visit (INDEPENDENT_AMBULATORY_CARE_PROVIDER_SITE_OTHER): Payer: Medicare Other | Admitting: Nurse Practitioner

## 2020-07-27 ENCOUNTER — Telehealth: Payer: Self-pay | Admitting: Internal Medicine

## 2020-07-27 VITALS — BP 140/86 | HR 78 | Temp 98.4°F

## 2020-07-27 DIAGNOSIS — B001 Herpesviral vesicular dermatitis: Secondary | ICD-10-CM

## 2020-07-27 DIAGNOSIS — U071 COVID-19: Secondary | ICD-10-CM | POA: Insufficient documentation

## 2020-07-27 DIAGNOSIS — R059 Cough, unspecified: Secondary | ICD-10-CM | POA: Diagnosis not present

## 2020-07-27 DIAGNOSIS — I517 Cardiomegaly: Secondary | ICD-10-CM | POA: Diagnosis not present

## 2020-07-27 MED ORDER — PREDNISONE 10 MG PO TABS: 10.0000 mg | ORAL_TABLET | Freq: Every day | ORAL | 0 refills | Status: AC

## 2020-07-27 MED ORDER — AMOXICILLIN-POT CLAVULANATE 875-125 MG PO TABS: 1.0000 | ORAL_TABLET | Freq: Two times a day (BID) | ORAL | 0 refills | Status: AC

## 2020-07-27 MED ORDER — ACYCLOVIR 5 % EX OINT: 1.0000 "application " | TOPICAL_OINTMENT | CUTANEOUS | 0 refills | Status: AC | PRN

## 2020-07-27 NOTE — Telephone Encounter (Signed)
Patients daughter called and is requesting a call back. She had some questions about her mother medication. She can be reached at [phone]

## 2020-07-27 NOTE — Progress Notes (Addendum)
@Patient  ID: Judy [name], female    DOB: Apr 21, 1931, 87 y.o.   MRN: [id number]  Chief Complaint  Patient presents with  . Covid Positive    A week ago Sunday. Today she is feeling better - Cough  -body aches in joints  -lots of mucus.     Referring provider: Binnie Rail, MD     HPI  Patient presents today for post COVID care clinic visit.  Patient states that she has tested positive for Covid on 07/18/2020.  Symptoms started a few days before that.  She states that she has ongoing issues with sinus congestion cough chest congestion and body aches since diagnosed with Covid.  She is trying to stay active and well-hydrated.  She does have family present with her today.  They have been checking her O2 sats at home.  O2 sats were 94% on room air upon arrival to the office today.  Patient also complains of a cold sore to her bottom lip today.  We discussed that we will go ahead and start patient on antibiotic and prednisone today.  Chest exam did reveal rhonchi to left and right lower lobes.  We will check a chest x-ray.  Advised patient to discuss medication changes with Coumadin clinic.  Denies f/c/s, n/v/d, hemoptysis, PND, chest pain or edema. .   Allergies  Allergen Reactions  . Betadine [Povidone Iodine] Other (See Comments)    "burns eyes"  . Methocarbamol     dizziness    Immunization History  Administered Date(s) Administered  . Fluad Quad(high Dose 65+) 04/13/2020  . Influenza Split 03/22/2012  . Influenza Whole 03/21/2011  . Influenza, High Dose Seasonal PF 04/06/2015, 02/18/2016, 03/02/2017, 03/08/2018  . Influenza,inj,quad, With Preservative 02/26/2019  . Influenza-Unspecified 03/12/2013  . PFIZER(Purple Top)SARS-COV-2 Vaccination 07/02/2019, 07/23/2019  . Pneumococcal Conjugate-13 09/21/2014  . Pneumococcal Polysaccharide-23 05/28/2013  . Zoster 06/12/2009    Past Medical History:  Diagnosis Date  . Atrial fibrillation ([place])    a. chronic anticoag,  failed prior DCCV;  b. 03/2010 Echo: EF 55-65%, mod MR, mildly to mod dil LA, mod dil RA, mild to mod MR.  Marland Kitchen CAD (coronary artery disease)    a. BMS to RCA 96';  b. Stent PDA and OM DES 2006  . CAROTID ARTERY DISEASE    a. 11/2011 Carotid U/S:  0-39% bilat dzs.  Marland Kitchen HTN (hypertension)   . Hyperlipidemia   . LBBB (left bundle branch block)   . Macular degeneration, wet ([place]) 10/2012 dx   L>R, follows with retinal specialist->injections monthly.  . Obesity   . Vertigo     Tobacco History: Social History   Tobacco Use  Smoking Status Never Smoker  Smokeless Tobacco Never Used   Counseling given: Yes   Outpatient Encounter Medications as of 07/27/2020  Medication Sig  . acetaminophen (TYLENOL) 500 MG tablet Take 500 mg by mouth every 6 (six) hours as needed.  Marland Kitchen acyclovir ointment (ZOVIRAX) 5 % Apply 1 application topically every 4 (four) hours as needed for up to 7 days.  Marland Kitchen amoxicillin-clavulanate (AUGMENTIN) 875-125 MG tablet Take 1 tablet by mouth 2 (two) times daily for 7 days.  Marland Kitchen aspirin 81 MG tablet Take 81 mg by mouth daily.  Marland Kitchen azelastine (ASTELIN) 0.1 % nasal spray Place 2 sprays into the nose 2 (two) times daily as needed for rhinitis or allergies.  Marland Kitchen b complex vitamins tablet Take 1 tablet by mouth daily.  . benzonatate (TESSALON) 100 MG capsule Take 1  capsule (100 mg total) by mouth 3 (three) times daily as needed.  . diltiazem (CARDIZEM CD) 120 MG 24 hr capsule Take 1 capsule (120 mg total) by mouth at bedtime. Please make yearly appt with Dr. Johnsie Cancel for April 2022 before anymore refills. Thank you 1st attempt  . Fexofenadine HCl (ALLEGRA ALLERGY PO) Take 1 tablet by mouth daily.   . isosorbide mononitrate (IMDUR) 30 MG 24 hr tablet Take 1 tablet (30 mg total) by mouth at bedtime. Please make yearly appt with Dr. Johnsie Cancel for April 2022 with Dr. Johnsie Cancel for future refills. Thank you 1st attempt  . lisinopril (ZESTRIL) 10 MG tablet Take 1 tablet (10 mg total) by mouth daily. Please  make yearly appt with Dr. Johnsie Cancel for April 2022 before anymore refills. Thank you 1st attempt  . metoprolol succinate (TOPROL-XL) 100 MG 24 hr tablet TAKE 1 TABLET (100 MG TOTAL) BY MOUTH DAILY. TAKE WITH OR IMMEDIATELY FOLLOWING A MEAL.  . Multiple Vitamin (MULTIVITAMIN WITH MINERALS) TABS tablet Take 1 tablet by mouth daily.  . nitroGLYCERIN (NITROSTAT) 0.4 MG SL tablet PLACE 1 TABLET UNDER TONGUE EVERY 5 MINUTES AS NEEDED FOR CHEST PAIN  . potassium chloride (MICRO-K) 10 MEQ CR capsule TAKE 1 CAPSULE (10 MEQ TOTAL) BY MOUTH DAILY.  Marland Kitchen predniSONE (DELTASONE) 10 MG tablet Take 1 tablet (10 mg total) by mouth daily with breakfast for 5 days.  . rosuvastatin (CRESTOR) 10 MG tablet TAKE 1 TABLET BY MOUTH EVERY DAY  . sodium chloride (OCEAN) 0.65 % SOLN nasal spray Place 1 spray into both nostrils as needed for congestion.  Marland Kitchen tobramycin (TOBREX) 0.3 % ophthalmic solution Place 1 drop into both eyes daily as needed.  . warfarin (COUMADIN) 1 MG tablet TAKE 2 TABLETS ON FRIDAYS OR AS DIRECTED BY ANTICOAGULATION CLINIC  . warfarin (COUMADIN) 2.5 MG tablet TAKE 1 TABLET DAILY OR TAKE AS DIRECTED BY ANTICOAGULATION CLINIC  . furosemide (LASIX) 20 MG tablet Take 1 tablet (20 mg total) by mouth daily. Please make yearly appt with Dr. Johnsie Cancel for April 2022 before anymore refills. Thank you 1st attempt (Patient not taking: Reported on 07/27/2020)  . meclizine (ANTIVERT) 25 MG tablet Take 1 tablet (25 mg total) by mouth 3 (three) times daily as needed for dizziness. (Patient not taking: Reported on 07/27/2020)   No facility-administered encounter medications on file as of 07/27/2020.     Review of Systems  Review of Systems  Constitutional: Positive for fatigue. Negative for fever.  HENT: Negative.   Respiratory: Positive for cough and shortness of breath.   Cardiovascular: Negative.   Gastrointestinal: Negative.   Allergic/Immunologic: Negative.   Neurological: Negative.   Psychiatric/Behavioral:  Negative.        Physical Exam  BP 140/86   Pulse 78   Temp 98.4 F (36.9 C)   SpO2 90%   Wt Readings from Last 5 Encounters:  02/17/20 164 lb 14.5 oz (74.8 kg)  02/17/20 164 lb 14.4 oz (74.8 kg)  09/29/19 166 lb (75.3 kg)  08/15/19 171 lb (77.6 kg)  03/24/19 180 lb (81.6 kg)     Physical Exam Vitals and nursing note reviewed.  Constitutional:      General: She is not in acute distress.    Appearance: She is well-developed and well-nourished.  Cardiovascular:     Rate and Rhythm: Normal rate and regular rhythm.  Pulmonary:     Effort: Pulmonary effort is normal.     Breath sounds: Rhonchi present.  Musculoskeletal:     Right  lower leg: No edema.     Left lower leg: No edema.  Neurological:     Mental Status: She is alert and oriented to person, place, and time.  Psychiatric:        Mood and Affect: Mood and affect and mood normal.        Behavior: Behavior normal.        Assessment & Plan:   COVID Cough Sinus congestion:   Stay well hydrated  Stay active  Deep breathing exercises  May take tylenol or fever or pain  May take mucinex twice daily  Will order chest x ray:  [facility] [address], [place] [address] [phone] MON - FRI 8:00 AM - 4:00 PM - WALK IN   Follow up:  Follow up in 2 weeks or sooner if needed      Fenton Foy, NP 07/27/2020

## 2020-07-27 NOTE — Telephone Encounter (Signed)
Called and spoke with patient's daughter Judy [name]. She was able to get her mom an appointment today to be seen. She will call back if script isn't at pharmacy.

## 2020-07-27 NOTE — Patient Instructions (Addendum)
Covid 19 Cough Sinus congestion:   Stay well hydrated  Stay active  Deep breathing exercises  May take tylenol or fever or pain  May take mucinex twice daily  Will order chest x ray:  [facility] [address], [place] [address] [phone] MON - FRI 8:00 AM - 4:00 PM - WALK IN   Follow up:  Follow up in 2 weeks or sooner if needed

## 2020-07-27 NOTE — Assessment & Plan Note (Signed)
Cough Sinus congestion:   Stay well hydrated  Stay active  Deep breathing exercises  May take tylenol or fever or pain  May take mucinex twice daily  Will order chest x ray:  [facility] [address], [place] [address] [phone] MON - FRI 8:00 AM - 4:00 PM - WALK IN   Follow up:  Follow up in 2 weeks or sooner if needed

## 2020-07-28 ENCOUNTER — Ambulatory Visit: Payer: Medicare Other

## 2020-07-29 ENCOUNTER — Telehealth: Payer: Self-pay | Admitting: Nurse Practitioner

## 2020-07-29 ENCOUNTER — Other Ambulatory Visit: Payer: Self-pay

## 2020-07-29 ENCOUNTER — Ambulatory Visit (INDEPENDENT_AMBULATORY_CARE_PROVIDER_SITE_OTHER): Payer: Medicare Other | Admitting: General Practice

## 2020-07-29 ENCOUNTER — Telehealth: Payer: Self-pay

## 2020-07-29 DIAGNOSIS — I4891 Unspecified atrial fibrillation: Secondary | ICD-10-CM | POA: Diagnosis not present

## 2020-07-29 DIAGNOSIS — Z7901 Long term (current) use of anticoagulants: Secondary | ICD-10-CM | POA: Diagnosis not present

## 2020-07-29 LAB — POCT INR: INR: 8 — AB (ref 2.0–3.0)

## 2020-07-29 MED ORDER — ACYCLOVIR 5 % EX OINT: TOPICAL_OINTMENT | CUTANEOUS | 0 refills | Status: DC

## 2020-07-29 NOTE — Progress Notes (Signed)
Medical screening examination/treatment/procedure(s) were performed by non-physician practitioner and as supervising physician I was immediately available for consultation/collaboration. I agree with above. Kawena Lyday, MD   

## 2020-07-29 NOTE — Patient Instructions (Signed)
Pre visit review using our clinic review tool, if applicable. No additional management support is needed unless otherwise documented below in the visit note.  Stop coumadin!  Go to ER if any unusual bleeding occurs.  Re-check on Tuesday.

## 2020-08-01 NOTE — Telephone Encounter (Signed)
noted 

## 2020-08-02 ENCOUNTER — Telehealth: Payer: Medicare Other

## 2020-08-03 ENCOUNTER — Ambulatory Visit: Payer: Medicare Other

## 2020-08-03 ENCOUNTER — Ambulatory Visit (INDEPENDENT_AMBULATORY_CARE_PROVIDER_SITE_OTHER): Payer: Medicare Other | Admitting: General Practice

## 2020-08-03 ENCOUNTER — Other Ambulatory Visit: Payer: Self-pay

## 2020-08-03 ENCOUNTER — Ambulatory Visit (INDEPENDENT_AMBULATORY_CARE_PROVIDER_SITE_OTHER): Payer: Medicare Other | Admitting: Nurse Practitioner

## 2020-08-03 VITALS — BP 113/74 | HR 95 | Temp 96.9°F | Resp 18

## 2020-08-03 DIAGNOSIS — J1282 Pneumonia due to coronavirus disease 2019: Secondary | ICD-10-CM | POA: Diagnosis not present

## 2020-08-03 DIAGNOSIS — I4891 Unspecified atrial fibrillation: Secondary | ICD-10-CM

## 2020-08-03 DIAGNOSIS — Z7901 Long term (current) use of anticoagulants: Secondary | ICD-10-CM

## 2020-08-03 DIAGNOSIS — U071 COVID-19: Secondary | ICD-10-CM | POA: Insufficient documentation

## 2020-08-03 LAB — POCT INR: INR: 2 (ref 2.0–3.0)

## 2020-08-03 MED ORDER — PREDNISONE 5 MG PO TABS: 5.0000 mg | ORAL_TABLET | Freq: Every day | ORAL | 0 refills | Status: AC

## 2020-08-03 NOTE — Patient Instructions (Signed)
Pre visit review using our clinic review tool, if applicable. No additional management support is needed unless otherwise documented below in the visit note. Continue to take 2.5 mg daily except take 2 mg on Thursdays.  Re-check in 10 days.

## 2020-08-03 NOTE — Progress Notes (Signed)
@Patient  ID: Judy [name], female    DOB: [birth year], 87 y.o.   MRN: [id number]  Chief Complaint  Patient presents with  . Follow-up    Follow up - slowly improving, fatigue, cough    Referring provider: Binnie Rail, MD     HPI   Patient presents today for post COVID care clinic visit.  Patient presents for follow up visit. Patient states that she has tested positive for Covid on 07/18/2020.  Symptoms started a few days before that.  Patient was last seen in our office on 07/27/2020.  Chest x-ray revealed Covid pneumonia.  Patient was prescribed Augmentin and prednisone.  She was also prescribed a acyclovir cream for cold sore.  Patient states that she is slowly improving.  She is trying to stay active.  She is trying to eat well and stay well-hydrated.  She admits that she has had a poor appetite.  She admits that she is not being compliant with deep breathing exercises.  We discussed the importance of continuing deep breathing exercises. Denies f/c/s, n/v/d, hemoptysis, PND, chest pain or edema.       Allergies  Allergen Reactions  . Betadine [Povidone Iodine] Other (See Comments)    "burns eyes"  . Methocarbamol     dizziness    Immunization History  Administered Date(s) Administered  . Fluad Quad(high Dose 65+) 04/13/2020  . Influenza Split 03/22/2012  . Influenza Whole 03/21/2011  . Influenza, High Dose Seasonal PF 04/06/2015, 02/18/2016, 03/02/2017, 03/08/2018  . Influenza,inj,quad, With Preservative 02/26/2019  . Influenza-Unspecified 03/12/2013  . PFIZER(Purple Top)SARS-COV-2 Vaccination 07/02/2019, 07/23/2019  . Pneumococcal Conjugate-13 09/21/2014  . Pneumococcal Polysaccharide-23 05/28/2013  . Zoster 06/12/2009    Past Medical History:  Diagnosis Date  . Atrial fibrillation ([place])    a. chronic anticoag, failed prior DCCV;  b. 03/2010 Echo: EF 55-65%, mod MR, mildly to mod dil LA, mod dil RA, mild to mod MR.  Marland Kitchen CAD (coronary artery disease)    a. BMS to RCA  96';  b. Stent PDA and OM DES 2006  . CAROTID ARTERY DISEASE    a. 11/2011 Carotid U/S:  0-39% bilat dzs.  Marland Kitchen HTN (hypertension)   . Hyperlipidemia   . LBBB (left bundle branch block)   . Macular degeneration, wet ([place]) 10/2012 dx   L>R, follows with retinal specialist->injections monthly.  . Obesity   . Vertigo     Tobacco History: Social History   Tobacco Use  Smoking Status Never Smoker  Smokeless Tobacco Never Used   Counseling given: Yes   Outpatient Encounter Medications as of 08/03/2020  Medication Sig  . predniSONE (DELTASONE) 5 MG tablet Take 1 tablet (5 mg total) by mouth daily with breakfast for 5 days.  Marland Kitchen acetaminophen (TYLENOL) 500 MG tablet Take 500 mg by mouth every 6 (six) hours as needed.  Marland Kitchen acyclovir ointment (ZOVIRAX) 5 % Apply 1 application topically every 4 (four) hours as needed for up to 7 days.  Marland Kitchen acyclovir ointment (ZOVIRAX) 5 % Use every 4 hours up to 7 days as needed  . amoxicillin-clavulanate (AUGMENTIN) 875-125 MG tablet Take 1 tablet by mouth 2 (two) times daily for 7 days.  Marland Kitchen aspirin 81 MG tablet Take 81 mg by mouth daily.  Marland Kitchen azelastine (ASTELIN) 0.1 % nasal spray Place 2 sprays into the nose 2 (two) times daily as needed for rhinitis or allergies.  Marland Kitchen b complex vitamins tablet Take 1 tablet by mouth daily.  . benzonatate (TESSALON) 100 MG capsule  Take 1 capsule (100 mg total) by mouth 3 (three) times daily as needed.  . diltiazem (CARDIZEM CD) 120 MG 24 hr capsule Take 1 capsule (120 mg total) by mouth at bedtime. Please make yearly appt with Dr. Johnsie Cancel for April 2022 before anymore refills. Thank you 1st attempt  . Fexofenadine HCl (ALLEGRA ALLERGY PO) Take 1 tablet by mouth daily.   . furosemide (LASIX) 20 MG tablet Take 1 tablet (20 mg total) by mouth daily. Please make yearly appt with Dr. Johnsie Cancel for April 2022 before anymore refills. Thank you 1st attempt (Patient not taking: Reported on 07/27/2020)  . isosorbide mononitrate (IMDUR) 30 MG 24 hr  tablet Take 1 tablet (30 mg total) by mouth at bedtime. Please make yearly appt with Dr. Johnsie Cancel for April 2022 with Dr. Johnsie Cancel for future refills. Thank you 1st attempt  . lisinopril (ZESTRIL) 10 MG tablet Take 1 tablet (10 mg total) by mouth daily. Please make yearly appt with Dr. Johnsie Cancel for April 2022 before anymore refills. Thank you 1st attempt  . meclizine (ANTIVERT) 25 MG tablet Take 1 tablet (25 mg total) by mouth 3 (three) times daily as needed for dizziness. (Patient not taking: Reported on 07/27/2020)  . metoprolol succinate (TOPROL-XL) 100 MG 24 hr tablet TAKE 1 TABLET (100 MG TOTAL) BY MOUTH DAILY. TAKE WITH OR IMMEDIATELY FOLLOWING A MEAL.  . Multiple Vitamin (MULTIVITAMIN WITH MINERALS) TABS tablet Take 1 tablet by mouth daily.  . nitroGLYCERIN (NITROSTAT) 0.4 MG SL tablet PLACE 1 TABLET UNDER TONGUE EVERY 5 MINUTES AS NEEDED FOR CHEST PAIN  . potassium chloride (MICRO-K) 10 MEQ CR capsule TAKE 1 CAPSULE (10 MEQ TOTAL) BY MOUTH DAILY.  . rosuvastatin (CRESTOR) 10 MG tablet TAKE 1 TABLET BY MOUTH EVERY DAY  . sodium chloride (OCEAN) 0.65 % SOLN nasal spray Place 1 spray into both nostrils as needed for congestion.  Marland Kitchen tobramycin (TOBREX) 0.3 % ophthalmic solution Place 1 drop into both eyes daily as needed.  . warfarin (COUMADIN) 1 MG tablet TAKE 2 TABLETS ON FRIDAYS OR AS DIRECTED BY ANTICOAGULATION CLINIC  . warfarin (COUMADIN) 2.5 MG tablet TAKE 1 TABLET DAILY OR TAKE AS DIRECTED BY ANTICOAGULATION CLINIC   No facility-administered encounter medications on file as of 08/03/2020.     Review of Systems  Review of Systems  Constitutional: Positive for fatigue. Negative for fever.  HENT: Negative.   Respiratory: Positive for cough and shortness of breath.   Cardiovascular: Negative.  Negative for chest pain, palpitations and leg swelling.  Gastrointestinal: Negative.   Allergic/Immunologic: Negative.   Neurological: Negative.   Psychiatric/Behavioral: Negative.         Physical Exam  BP 113/74   Pulse 95   Temp (!) 96.9 F (36.1 C)   Resp 18   SpO2 96% Comment: RA  Wt Readings from Last 5 Encounters:  02/17/20 164 lb 14.5 oz (74.8 kg)  02/17/20 164 lb 14.4 oz (74.8 kg)  09/29/19 166 lb (75.3 kg)  08/15/19 171 lb (77.6 kg)  03/24/19 180 lb (81.6 kg)     Physical Exam Vitals and nursing note reviewed.  Constitutional:      General: She is not in acute distress.    Appearance: She is well-developed and well-nourished.  Cardiovascular:     Rate and Rhythm: Normal rate and regular rhythm.  Pulmonary:     Effort: Pulmonary effort is normal.     Breath sounds: Normal breath sounds.  Musculoskeletal:     Right lower leg: No edema.  Left lower leg: No edema.  Neurological:     Mental Status: She is alert and oriented to person, place, and time.  Psychiatric:        Mood and Affect: Mood and affect and mood normal.        Behavior: Behavior normal.      Imaging: DG Chest 2 View  Result Date: 07/28/2020 CLINICAL DATA:  Cough, COVID positive. EXAM: CHEST - 2 VIEW COMPARISON:  03/26/2013 FINDINGS: Patchy heterogeneous bilateral airspace opacities in a mid-lower lung zone predominant distribution. Stable cardiomegaly. Unchanged mediastinal contours. No pleural fluid or pneumothorax. Stable osseous structures. Diffuse degenerative change in the spine. IMPRESSION: 1. Patchy heterogeneous bilateral airspace opacities consistent with multifocal pneumonia, pattern typical of COVID-19. 2. Stable cardiomegaly. Electronically Signed   By: Keith Rake M.D.   On: 07/28/2020 20:36     Assessment & Plan:   Pneumonia due to COVID-19 virus Cough Sinus congestion:   Stay well hydrated  Stay active  Deep breathing exercises  May take tylenol for fever or pain  May take mucinex twice daily  Will order prednisone  May take melatonin at night  May take delsym    Follow up:  Follow up in 2 weeks or sooner if  needed      Fenton Foy, NP 08/03/2020

## 2020-08-03 NOTE — Assessment & Plan Note (Signed)
Cough Sinus congestion:   Stay well hydrated  Stay active  Deep breathing exercises  May take tylenol for fever or pain  May take mucinex twice daily  Will order prednisone  May take melatonin at night  May take delsym    Follow up:  Follow up in 2 weeks or sooner if needed

## 2020-08-03 NOTE — Patient Instructions (Addendum)
COVID Cough Sinus congestion:   Stay well hydrated  Stay active  Deep breathing exercises  May take tylenol for fever or pain  May take mucinex twice daily  Will order prednisone  May take melatonin at night  May take delsym    Follow up:  Follow up in 2 weeks or sooner if needed

## 2020-08-03 NOTE — Progress Notes (Signed)
Agree with management.  Melessa Cowell J Jacobi Nile, MD  

## 2020-08-04 NOTE — Telephone Encounter (Signed)
Sent to provider 

## 2020-08-05 ENCOUNTER — Telehealth: Payer: Self-pay | Admitting: Internal Medicine

## 2020-08-05 NOTE — Telephone Encounter (Signed)
Patients daughter, [name] called and is requesting a call back to go over her mother medications/ medications list. She can be reaches at [phone]

## 2020-08-10 DIAGNOSIS — H353223 Exudative age-related macular degeneration, left eye, with inactive scar: Secondary | ICD-10-CM | POA: Diagnosis not present

## 2020-08-10 DIAGNOSIS — H35373 Puckering of macula, bilateral: Secondary | ICD-10-CM | POA: Diagnosis not present

## 2020-08-10 DIAGNOSIS — H353211 Exudative age-related macular degeneration, right eye, with active choroidal neovascularization: Secondary | ICD-10-CM | POA: Diagnosis not present

## 2020-08-10 DIAGNOSIS — H35423 Microcystoid degeneration of retina, bilateral: Secondary | ICD-10-CM | POA: Diagnosis not present

## 2020-08-10 NOTE — Telephone Encounter (Signed)
Message left for patient's daughter.  If she calls please see what questions she has regarding medications and which ones she had questions about.  Thanks

## 2020-08-12 ENCOUNTER — Other Ambulatory Visit: Payer: Self-pay

## 2020-08-12 ENCOUNTER — Ambulatory Visit (INDEPENDENT_AMBULATORY_CARE_PROVIDER_SITE_OTHER): Payer: Medicare Other | Admitting: General Practice

## 2020-08-12 DIAGNOSIS — I4891 Unspecified atrial fibrillation: Secondary | ICD-10-CM | POA: Diagnosis not present

## 2020-08-12 DIAGNOSIS — Z7901 Long term (current) use of anticoagulants: Secondary | ICD-10-CM | POA: Diagnosis not present

## 2020-08-12 LAB — POCT INR: INR: 2.2 (ref 2.0–3.0)

## 2020-08-12 NOTE — Progress Notes (Signed)
Agree with management.  Jailyne Chieffo J Magally Vahle, MD  

## 2020-08-12 NOTE — Patient Instructions (Signed)
Pre visit review using our clinic review tool, if applicable. No additional management support is needed unless otherwise documented below in the visit note.  Continue to take 2.5 mg daily except take 2 mg on Thursdays.  Re-check in 4 weeks.

## 2020-08-17 ENCOUNTER — Ambulatory Visit (INDEPENDENT_AMBULATORY_CARE_PROVIDER_SITE_OTHER): Payer: Medicare Other | Admitting: Nurse Practitioner

## 2020-08-17 VITALS — BP 112/70 | HR 95 | Temp 97.9°F

## 2020-08-17 DIAGNOSIS — U071 COVID-19: Secondary | ICD-10-CM

## 2020-08-17 DIAGNOSIS — J1282 Pneumonia due to coronavirus disease 2019: Secondary | ICD-10-CM | POA: Diagnosis not present

## 2020-08-17 NOTE — Patient Instructions (Signed)
Covid 19 Fatigue:   Stay well hydrated  Stay active  Deep breathing exercises  May start vitamin C daily, vitamin D3 daily, Zinc daily  May take tylenol for fever or pain  May take mucinex twice daily  Will order chest x ray:  [facility] [address], [place] [address] [phone] MON - FRI 8:00 AM - 4:00 PM - WALK IN   Follow up:  Follow up in 4 weeks or sooner if needed

## 2020-08-17 NOTE — Assessment & Plan Note (Signed)
Fatigue:   Stay well hydrated  Stay active  Deep breathing exercises  May start vitamin C daily, vitamin D3 daily, Zinc daily  May take tylenol for fever or pain  May take mucinex twice daily  Will order chest x ray:  [facility] [address], [place] [address] [phone] MON - FRI 8:00 AM - 4:00 PM - WALK IN   Follow up:  Follow up in 4 weeks or sooner if needed

## 2020-08-17 NOTE — Progress Notes (Signed)
@Patient  ID: Judy [name], female    DOB: 1931-05-27, 87 y.o.   MRN: [id number]  Chief Complaint  Patient presents with  . Follow-up    Slowly improving    Referring provider: Binnie Rail, MD     HPI  Patient presents today for post COVID care clinic visit/follow-up.  Patient was last seen in our office on 08/03/2020.  Patient's chest x-ray on 07/27/2020 did show Covid pneumonia.  Patient states that she is feeling much better since her last visit here.  She does have issues ongoing with fatigue but overall is improving.  She will be due for repeat imaging next week. Denies f/c/s, n/v/d, hemoptysis, PND, chest pain or edema.  Vitals with BMI 08/17/2020 08/03/2020 07/27/2020  Height - - -  Weight - - -  BMI - - -  Systolic 062 376 283  Diastolic 70 74 86  Pulse 95 95 -       Allergies  Allergen Reactions  . Betadine [Povidone Iodine] Other (See Comments)    "burns eyes"  . Methocarbamol     dizziness    Immunization History  Administered Date(s) Administered  . Fluad Quad(high Dose 65+) 04/13/2020  . Influenza Split 03/22/2012  . Influenza Whole 03/21/2011  . Influenza, High Dose Seasonal PF 04/06/2015, 02/18/2016, 03/02/2017, 03/08/2018  . Influenza,inj,quad, With Preservative 02/26/2019  . Influenza-Unspecified 03/12/2013  . PFIZER(Purple Top)SARS-COV-2 Vaccination 07/02/2019, 07/23/2019  . Pneumococcal Conjugate-13 09/21/2014  . Pneumococcal Polysaccharide-23 05/28/2013  . Zoster 06/12/2009    Past Medical History:  Diagnosis Date  . Atrial fibrillation ([place])    a. chronic anticoag, failed prior DCCV;  b. 03/2010 Echo: EF 55-65%, mod MR, mildly to mod dil LA, mod dil RA, mild to mod MR.  Marland Kitchen CAD (coronary artery disease)    a. BMS to RCA 96';  b. Stent PDA and OM DES 2006  . CAROTID ARTERY DISEASE    a. 11/2011 Carotid U/S:  0-39% bilat dzs.  Marland Kitchen HTN (hypertension)   . Hyperlipidemia   . LBBB (left bundle branch block)   . Macular degeneration, wet ([place])  10/2012 dx   L>R, follows with retinal specialist->injections monthly.  . Obesity   . Vertigo     Tobacco History: Social History   Tobacco Use  Smoking Status Never Smoker  Smokeless Tobacco Never Used   Counseling given: Yes   Outpatient Encounter Medications as of 08/17/2020  Medication Sig  . acetaminophen (TYLENOL) 500 MG tablet Take 500 mg by mouth every 6 (six) hours as needed.  Marland Kitchen acyclovir ointment (ZOVIRAX) 5 % Use every 4 hours up to 7 days as needed  . aspirin 81 MG tablet Take 81 mg by mouth daily.  Marland Kitchen azelastine (ASTELIN) 0.1 % nasal spray Place 2 sprays into the nose 2 (two) times daily as needed for rhinitis or allergies.  Marland Kitchen b complex vitamins tablet Take 1 tablet by mouth daily.  . benzonatate (TESSALON) 100 MG capsule Take 1 capsule (100 mg total) by mouth 3 (three) times daily as needed.  . diltiazem (CARDIZEM CD) 120 MG 24 hr capsule Take 1 capsule (120 mg total) by mouth at bedtime. Please make yearly appt with Dr. Johnsie Cancel for April 2022 before anymore refills. Thank you 1st attempt  . Fexofenadine HCl (ALLEGRA ALLERGY PO) Take 1 tablet by mouth daily.   . furosemide (LASIX) 20 MG tablet Take 1 tablet (20 mg total) by mouth daily. Please make yearly appt with Dr. Johnsie Cancel for April 2022 before  anymore refills. Thank you 1st attempt (Patient not taking: Reported on 07/27/2020)  . isosorbide mononitrate (IMDUR) 30 MG 24 hr tablet Take 1 tablet (30 mg total) by mouth at bedtime. Please make yearly appt with Dr. Johnsie Cancel for April 2022 with Dr. Johnsie Cancel for future refills. Thank you 1st attempt  . lisinopril (ZESTRIL) 10 MG tablet Take 1 tablet (10 mg total) by mouth daily. Please make yearly appt with Dr. Johnsie Cancel for April 2022 before anymore refills. Thank you 1st attempt  . meclizine (ANTIVERT) 25 MG tablet Take 1 tablet (25 mg total) by mouth 3 (three) times daily as needed for dizziness. (Patient not taking: Reported on 07/27/2020)  . metoprolol succinate (TOPROL-XL) 100 MG 24 hr  tablet TAKE 1 TABLET (100 MG TOTAL) BY MOUTH DAILY. TAKE WITH OR IMMEDIATELY FOLLOWING A MEAL.  . Multiple Vitamin (MULTIVITAMIN WITH MINERALS) TABS tablet Take 1 tablet by mouth daily.  . nitroGLYCERIN (NITROSTAT) 0.4 MG SL tablet PLACE 1 TABLET UNDER TONGUE EVERY 5 MINUTES AS NEEDED FOR CHEST PAIN  . potassium chloride (MICRO-K) 10 MEQ CR capsule TAKE 1 CAPSULE (10 MEQ TOTAL) BY MOUTH DAILY.  . rosuvastatin (CRESTOR) 10 MG tablet TAKE 1 TABLET BY MOUTH EVERY DAY  . sodium chloride (OCEAN) 0.65 % SOLN nasal spray Place 1 spray into both nostrils as needed for congestion.  Marland Kitchen tobramycin (TOBREX) 0.3 % ophthalmic solution Place 1 drop into both eyes daily as needed.  . warfarin (COUMADIN) 1 MG tablet TAKE 2 TABLETS ON FRIDAYS OR AS DIRECTED BY ANTICOAGULATION CLINIC  . warfarin (COUMADIN) 2.5 MG tablet TAKE 1 TABLET DAILY OR TAKE AS DIRECTED BY ANTICOAGULATION CLINIC   No facility-administered encounter medications on file as of 08/17/2020.     Review of Systems  Review of Systems  Constitutional: Positive for fatigue.  HENT: Negative.   Respiratory: Negative for cough and shortness of breath.   Cardiovascular: Negative.   Gastrointestinal: Negative.   Allergic/Immunologic: Negative.   Neurological: Negative.   Psychiatric/Behavioral: Negative.        Physical Exam  BP 112/70   Pulse 95   Temp 97.9 F (36.6 C)   Wt Readings from Last 5 Encounters:  02/17/20 164 lb 14.5 oz (74.8 kg)  02/17/20 164 lb 14.4 oz (74.8 kg)  09/29/19 166 lb (75.3 kg)  08/15/19 171 lb (77.6 kg)  03/24/19 180 lb (81.6 kg)     Physical Exam Vitals and nursing note reviewed.  Constitutional:      General: She is not in acute distress.    Appearance: She is well-developed and well-nourished.  Cardiovascular:     Rate and Rhythm: Normal rate and regular rhythm.  Pulmonary:     Effort: Pulmonary effort is normal.     Breath sounds: Normal breath sounds.  Musculoskeletal:     Right lower leg: No  edema.     Left lower leg: No edema.  Neurological:     Mental Status: She is alert and oriented to person, place, and time.  Psychiatric:        Mood and Affect: Mood and affect and mood normal.        Behavior: Behavior normal.        Imaging: DG Chest 2 View  Result Date: 07/28/2020 CLINICAL DATA:  Cough, COVID positive. EXAM: CHEST - 2 VIEW COMPARISON:  03/26/2013 FINDINGS: Patchy heterogeneous bilateral airspace opacities in a mid-lower lung zone predominant distribution. Stable cardiomegaly. Unchanged mediastinal contours. No pleural fluid or pneumothorax. Stable osseous structures. Diffuse degenerative  change in the spine. IMPRESSION: 1. Patchy heterogeneous bilateral airspace opacities consistent with multifocal pneumonia, pattern typical of COVID-19. 2. Stable cardiomegaly. Electronically Signed   By: Keith Rake M.D.   On: 07/28/2020 20:36     Assessment & Plan:   Pneumonia due to COVID-19 virus Fatigue:   Stay well hydrated  Stay active  Deep breathing exercises  May start vitamin C daily, vitamin D3 daily, Zinc daily  May take tylenol for fever or pain  May take mucinex twice daily  Will order chest x ray:  [facility] [address], [place] [address] [phone] MON - FRI 8:00 AM - 4:00 PM - WALK IN   Follow up:  Follow up in 4 weeks or sooner if needed      Fenton Foy, NP 08/17/2020

## 2020-08-24 DIAGNOSIS — M25562 Pain in left knee: Secondary | ICD-10-CM | POA: Diagnosis not present

## 2020-08-24 DIAGNOSIS — M25561 Pain in right knee: Secondary | ICD-10-CM | POA: Diagnosis not present

## 2020-08-24 DIAGNOSIS — M170 Bilateral primary osteoarthritis of knee: Secondary | ICD-10-CM | POA: Diagnosis not present

## 2020-08-26 ENCOUNTER — Ambulatory Visit
Admission: RE | Admit: 2020-08-26 | Discharge: 2020-08-26 | Disposition: A | Discharge status: Home / Self Care | Payer: Medicare Other | Source: Ambulatory Visit | Attending: Nurse Practitioner | Admitting: Nurse Practitioner

## 2020-08-26 ENCOUNTER — Other Ambulatory Visit: Payer: Self-pay

## 2020-08-26 DIAGNOSIS — U071 COVID-19: Secondary | ICD-10-CM

## 2020-08-26 DIAGNOSIS — R918 Other nonspecific abnormal finding of lung field: Secondary | ICD-10-CM | POA: Diagnosis not present

## 2020-08-26 DIAGNOSIS — I517 Cardiomegaly: Secondary | ICD-10-CM | POA: Diagnosis not present

## 2020-08-26 DIAGNOSIS — J1282 Pneumonia due to coronavirus disease 2019: Secondary | ICD-10-CM

## 2020-08-26 DIAGNOSIS — M5134 Other intervertebral disc degeneration, thoracic region: Secondary | ICD-10-CM | POA: Diagnosis not present

## 2020-08-27 ENCOUNTER — Other Ambulatory Visit: Payer: Self-pay | Admitting: Nurse Practitioner

## 2020-08-27 MED ORDER — PREDNISONE 20 MG PO TABS: 20.0000 mg | ORAL_TABLET | Freq: Every day | ORAL | 0 refills | Status: AC

## 2020-08-31 DIAGNOSIS — M170 Bilateral primary osteoarthritis of knee: Secondary | ICD-10-CM | POA: Diagnosis not present

## 2020-09-02 ENCOUNTER — Telehealth: Payer: Self-pay | Admitting: Pharmacist

## 2020-09-02 NOTE — Chronic Care Management (AMB) (Addendum)
Chronic Care Management Pharmacy Assistant   Name: Judy [name]  MRN: [id number] DOB: 02-Jul-1930   Reason for Encounter: Hypertension Disease State Call   Conditions to be addressed/monitored: HTN   Recent office visits:  07/19/20 Dr. Little Ishikawa call 07/27/20 Lazaro Arms, NP 08/03/20 Mariann Laster 08/17/20 Lazaro Arms, NP  Recent consult visits:  None ID  Hospital visits:  None in previous 6 months  Medications: Outpatient Encounter Medications as of 09/02/2020  Medication Sig   acetaminophen (TYLENOL) 500 MG tablet Take 500 mg by mouth every 6 (six) hours as needed.   acyclovir ointment (ZOVIRAX) 5 % Use every 4 hours up to 7 days as needed   aspirin 81 MG tablet Take 81 mg by mouth daily.   azelastine (ASTELIN) 0.1 % nasal spray Place 2 sprays into the nose 2 (two) times daily as needed for rhinitis or allergies.   b complex vitamins tablet Take 1 tablet by mouth daily.   benzonatate (TESSALON) 100 MG capsule Take 1 capsule (100 mg total) by mouth 3 (three) times daily as needed.   diltiazem (CARDIZEM CD) 120 MG 24 hr capsule Take 1 capsule (120 mg total) by mouth at bedtime. Please make yearly appt with Dr. Johnsie Cancel for April 2022 before anymore refills. Thank you 1st attempt   Fexofenadine HCl (ALLEGRA ALLERGY PO) Take 1 tablet by mouth daily.    furosemide (LASIX) 20 MG tablet Take 1 tablet (20 mg total) by mouth daily. Please make yearly appt with Dr. Johnsie Cancel for April 2022 before anymore refills. Thank you 1st attempt (Patient not taking: Reported on 07/27/2020)   isosorbide mononitrate (IMDUR) 30 MG 24 hr tablet Take 1 tablet (30 mg total) by mouth at bedtime. Please make yearly appt with Dr. Johnsie Cancel for April 2022 with Dr. Johnsie Cancel for future refills. Thank you 1st attempt   lisinopril (ZESTRIL) 10 MG tablet Take 1 tablet (10 mg total) by mouth daily. Please make yearly appt with Dr. Johnsie Cancel for April 2022 before anymore refills. Thank you 1st attempt   meclizine  (ANTIVERT) 25 MG tablet Take 1 tablet (25 mg total) by mouth 3 (three) times daily as needed for dizziness. (Patient not taking: Reported on 07/27/2020)   metoprolol succinate (TOPROL-XL) 100 MG 24 hr tablet TAKE 1 TABLET (100 MG TOTAL) BY MOUTH DAILY. TAKE WITH OR IMMEDIATELY FOLLOWING A MEAL.   Multiple Vitamin (MULTIVITAMIN WITH MINERALS) TABS tablet Take 1 tablet by mouth daily.   nitroGLYCERIN (NITROSTAT) 0.4 MG SL tablet PLACE 1 TABLET UNDER TONGUE EVERY 5 MINUTES AS NEEDED FOR CHEST PAIN   potassium chloride (MICRO-K) 10 MEQ CR capsule TAKE 1 CAPSULE (10 MEQ TOTAL) BY MOUTH DAILY.   rosuvastatin (CRESTOR) 10 MG tablet TAKE 1 TABLET BY MOUTH EVERY DAY   sodium chloride (OCEAN) 0.65 % SOLN nasal spray Place 1 spray into both nostrils as needed for congestion.   tobramycin (TOBREX) 0.3 % ophthalmic solution Place 1 drop into both eyes daily as needed.   warfarin (COUMADIN) 1 MG tablet TAKE 2 TABLETS ON FRIDAYS OR AS DIRECTED BY ANTICOAGULATION CLINIC   warfarin (COUMADIN) 2.5 MG tablet TAKE 1 TABLET DAILY OR TAKE AS DIRECTED BY ANTICOAGULATION CLINIC   No facility-administered encounter medications on file as of 09/02/2020.    Pharmacist Review:  Reviewed chart prior to disease state call. Spoke with patient regarding BP  Recent Office Vitals: BP Readings from Last 3 Encounters:  08/17/20 112/70  08/03/20 113/74  07/27/20 140/86   Pulse Readings from Last  3 Encounters:  08/17/20 95  08/03/20 95  07/27/20 78    Wt Readings from Last 3 Encounters:  02/17/20 164 lb 14.5 oz (74.8 kg)  02/17/20 164 lb 14.4 oz (74.8 kg)  09/29/19 166 lb (75.3 kg)     Kidney Function Lab Results  Component Value Date/Time   CREATININE 1.05 (H) 02/17/2020 10:16 AM   CREATININE 1.02 08/15/2019 10:05 AM   CREATININE 0.89 08/12/2018 10:52 AM   GFR 51.26 (L) 08/15/2019 10:05 AM   GFRNONAA 48 (L) 02/17/2020 10:16 AM   GFRAA 55 (L) 02/17/2020 10:16 AM    BMP Latest Ref Rng & Units 02/17/2020 08/15/2019  08/12/2018  Glucose 65 - 99 mg/dL 106(H) 104(H) 105(H)  BUN 7 - 25 mg/dL 16 15 14   Creatinine 0.60 - 0.88 mg/dL 1.05(H) 1.02 0.89  BUN/Creat Ratio 6 - 22 (calc) 15 - -  Sodium 135 - 146 mmol/L 143 140 141  Potassium 3.5 - 5.3 mmol/L 5.1 4.3 4.1  Chloride 98 - 110 mmol/L 109 105 106  CO2 20 - 32 mmol/L 25 27 25   Calcium 8.6 - 10.4 mg/dL 9.9 10.3 9.6    Current antihypertensive regimen: The patient takes isosorbide 30 mg bedtime, lisinopril 10 mg daily,metoprolol 100 mg daily   How often are you checking your Blood Pressure? The patient states that her daughters sometimes come and take her blood pressure  Current home BP readings: The patient does not have any blood pressure readings  What recent interventions/DTPs have been made by any provider to improve Blood Pressure control since last CPP Visit: The patient is to continue to take blood pressure meds  Any recent hospitalizations or ED visits since last visit with CPP? The patient has not had any hospital or ED visits  What diet changes have been made to improve Blood Pressure Control?  The patient states that she has not made any new changes to her diet  What exercise is being done to improve your Blood Pressure Control?  The patient states that she has not been able to exercise because she has had covid and pnuemonia  Adherence Review: Is the patient currently on ACE/ARB medication? Yes, lisinopril Does the patient have >5 day gap between last estimated fill dates? No, Lisinopril last filled on 07/15/20 at [facility] on [address]   Wendy Poet, [place] [phone]

## 2020-09-07 DIAGNOSIS — M170 Bilateral primary osteoarthritis of knee: Secondary | ICD-10-CM | POA: Diagnosis not present

## 2020-09-07 DIAGNOSIS — M25562 Pain in left knee: Secondary | ICD-10-CM | POA: Diagnosis not present

## 2020-09-07 DIAGNOSIS — M25561 Pain in right knee: Secondary | ICD-10-CM | POA: Diagnosis not present

## 2020-09-14 ENCOUNTER — Ambulatory Visit (INDEPENDENT_AMBULATORY_CARE_PROVIDER_SITE_OTHER): Payer: Medicare Other | Admitting: General Practice

## 2020-09-14 ENCOUNTER — Ambulatory Visit (INDEPENDENT_AMBULATORY_CARE_PROVIDER_SITE_OTHER): Payer: Medicare Other | Admitting: Nurse Practitioner

## 2020-09-14 ENCOUNTER — Other Ambulatory Visit: Payer: Self-pay

## 2020-09-14 VITALS — BP 121/78 | HR 68 | Temp 97.5°F | Resp 16 | Ht 63.0 in | Wt 145.0 lb

## 2020-09-14 DIAGNOSIS — I4891 Unspecified atrial fibrillation: Secondary | ICD-10-CM

## 2020-09-14 DIAGNOSIS — Z7901 Long term (current) use of anticoagulants: Secondary | ICD-10-CM | POA: Diagnosis not present

## 2020-09-14 DIAGNOSIS — U071 COVID-19: Secondary | ICD-10-CM

## 2020-09-14 DIAGNOSIS — J1282 Pneumonia due to coronavirus disease 2019: Secondary | ICD-10-CM

## 2020-09-14 LAB — POCT INR: INR: 2.3 (ref 2.0–3.0)

## 2020-09-14 MED ORDER — ACYCLOVIR 5 % EX OINT: TOPICAL_OINTMENT | CUTANEOUS | 1 refills | Status: DC

## 2020-09-14 NOTE — Assessment & Plan Note (Signed)
Fatigue:   Stay well hydrated  Stay active  Deep breathing exercises  May start vitamin C daily, vitamin D3 daily, Zinc daily  May take tylenol for fever or pain    Follow up:  Follow up in 4 weeks or sooner if needed - May need repeat imaging

## 2020-09-14 NOTE — Patient Instructions (Addendum)
Pre visit review using our clinic review tool, if applicable. No additional management support is needed unless otherwise documented below in the visit note.  Continue to take 2.5 mg daily except take 2 mg on Thursdays.  Re-check in 6 weeks.

## 2020-09-14 NOTE — Progress Notes (Signed)
@Patient  ID: Judy [name], female    DOB: 02/15/1931, 87 y.o.   MRN: [id number]  Chief Complaint  Patient presents with  . Follow-up    Onset 07/18/2020. Feeling better. Steroids helped with breathing.     Referring provider: Binnie Rail, MD  HPI  Patient presents today for post COVID care clinic visit follow-up.  Patient states that she is much better.  Her last chest x-ray did show almost complete resolution of COVID pneumonia.  Patient states that overall she is back to her baseline.  She has no new issues or complaints today. Denies f/c/s, n/v/d, hemoptysis, PND, chest pain or edema.     Allergies  Allergen Reactions  . Betadine [Povidone Iodine] Other (See Comments)    "burns eyes"  . Methocarbamol     dizziness    Immunization History  Administered Date(s) Administered  . Fluad Quad(high Dose 65+) 04/13/2020  . Influenza Split 03/22/2012  . Influenza Whole 03/21/2011  . Influenza, High Dose Seasonal PF 04/06/2015, 02/18/2016, 03/02/2017, 03/08/2018  . Influenza,inj,quad, With Preservative 02/26/2019  . Influenza-Unspecified 03/12/2013  . PFIZER(Purple Top)SARS-COV-2 Vaccination 07/02/2019, 07/23/2019  . Pneumococcal Conjugate-13 09/21/2014  . Pneumococcal Polysaccharide-23 05/28/2013  . Zoster 06/12/2009    Past Medical History:  Diagnosis Date  . Atrial fibrillation ([place])    a. chronic anticoag, failed prior DCCV;  b. 03/2010 Echo: EF 55-65%, mod MR, mildly to mod dil LA, mod dil RA, mild to mod MR.  Marland Kitchen CAD (coronary artery disease)    a. BMS to RCA 96';  b. Stent PDA and OM DES 2006  . CAROTID ARTERY DISEASE    a. 11/2011 Carotid U/S:  0-39% bilat dzs.  Marland Kitchen HTN (hypertension)   . Hyperlipidemia   . LBBB (left bundle branch block)   . Macular degeneration, wet ([place]) 10/2012 dx   L>R, follows with retinal specialist->injections monthly.  . Obesity   . Vertigo     Tobacco History: Social History   Tobacco Use  Smoking Status Never Smoker  Smokeless  Tobacco Never Used   Counseling given: Not Answered   Outpatient Encounter Medications as of 09/14/2020  Medication Sig  . acetaminophen (TYLENOL) 500 MG tablet Take 500 mg by mouth every 6 (six) hours as needed.  Marland Kitchen aspirin 81 MG tablet Take 81 mg by mouth daily.  Marland Kitchen azelastine (ASTELIN) 0.1 % nasal spray Place 2 sprays into the nose 2 (two) times daily as needed for rhinitis or allergies.  Marland Kitchen b complex vitamins tablet Take 1 tablet by mouth daily.  . benzonatate (TESSALON) 100 MG capsule Take 1 capsule (100 mg total) by mouth 3 (three) times daily as needed.  . diltiazem (CARDIZEM CD) 120 MG 24 hr capsule Take 1 capsule (120 mg total) by mouth at bedtime. Please make yearly appt with Dr. Johnsie Cancel for April 2022 before anymore refills. Thank you 1st attempt  . Fexofenadine HCl (ALLEGRA ALLERGY PO) Take 1 tablet by mouth daily.   . furosemide (LASIX) 20 MG tablet Take 1 tablet (20 mg total) by mouth daily. Please make yearly appt with Dr. Johnsie Cancel for April 2022 before anymore refills. Thank you 1st attempt  . isosorbide mononitrate (IMDUR) 30 MG 24 hr tablet Take 1 tablet (30 mg total) by mouth at bedtime. Please make yearly appt with Dr. Johnsie Cancel for April 2022 with Dr. Johnsie Cancel for future refills. Thank you 1st attempt  . lisinopril (ZESTRIL) 10 MG tablet Take 1 tablet (10 mg total) by mouth daily. Please make yearly  appt with Dr. Johnsie Cancel for April 2022 before anymore refills. Thank you 1st attempt  . metoprolol succinate (TOPROL-XL) 100 MG 24 hr tablet TAKE 1 TABLET (100 MG TOTAL) BY MOUTH DAILY. TAKE WITH OR IMMEDIATELY FOLLOWING A MEAL.  . Multiple Vitamin (MULTIVITAMIN WITH MINERALS) TABS tablet Take 1 tablet by mouth daily.  . nitroGLYCERIN (NITROSTAT) 0.4 MG SL tablet PLACE 1 TABLET UNDER TONGUE EVERY 5 MINUTES AS NEEDED FOR CHEST PAIN  . potassium chloride (MICRO-K) 10 MEQ CR capsule TAKE 1 CAPSULE (10 MEQ TOTAL) BY MOUTH DAILY.  . rosuvastatin (CRESTOR) 10 MG tablet TAKE 1 TABLET BY MOUTH EVERY DAY   . sodium chloride (OCEAN) 0.65 % SOLN nasal spray Place 1 spray into both nostrils as needed for congestion.  Marland Kitchen tobramycin (TOBREX) 0.3 % ophthalmic solution Place 1 drop into both eyes daily as needed.  . warfarin (COUMADIN) 1 MG tablet TAKE 2 TABLETS ON FRIDAYS OR AS DIRECTED BY ANTICOAGULATION CLINIC  . warfarin (COUMADIN) 2.5 MG tablet TAKE 1 TABLET DAILY OR TAKE AS DIRECTED BY ANTICOAGULATION CLINIC  . [DISCONTINUED] acyclovir ointment (ZOVIRAX) 5 % Use every 4 hours up to 7 days as needed  . acyclovir ointment (ZOVIRAX) 5 % Use every 4 hours up to 7 days as needed  . meclizine (ANTIVERT) 25 MG tablet Take 1 tablet (25 mg total) by mouth 3 (three) times daily as needed for dizziness. (Patient not taking: No sig reported)   No facility-administered encounter medications on file as of 09/14/2020.     Review of Systems  Review of Systems     Physical Exam  BP 121/78   Pulse 68   Temp (!) 97.5 F (36.4 C) (Temporal)   Resp 16   Ht 5\' 3"  (1.6 m)   Wt 145 lb (65.8 kg)   SpO2 98%   BMI 25.69 kg/m   Wt Readings from Last 5 Encounters:  09/14/20 145 lb (65.8 kg)  02/17/20 164 lb 14.5 oz (74.8 kg)  02/17/20 164 lb 14.4 oz (74.8 kg)  09/29/19 166 lb (75.3 kg)  08/15/19 171 lb (77.6 kg)     Physical Exam Vitals and nursing note reviewed.  Constitutional:      General: She is not in acute distress.    Appearance: She is well-developed.  Cardiovascular:     Rate and Rhythm: Normal rate and regular rhythm.  Pulmonary:     Effort: Pulmonary effort is normal.     Breath sounds: Rhonchi present.     Comments: Left lower lobe Musculoskeletal:     Right lower leg: No edema.     Left lower leg: No edema.  Neurological:     Mental Status: She is alert and oriented to person, place, and time.  Psychiatric:        Mood and Affect: Mood normal.        Behavior: Behavior normal.        Imaging: DG Chest 2 View  Result Date: 08/27/2020 CLINICAL DATA:  History of COVID  EXAM: CHEST - 2 VIEW COMPARISON:  07/27/2020 FINDINGS: Cardiomegaly. Subtle heterogeneous airspace opacity is almost completely resolved. Disc degenerative disease of the thoracic spine. IMPRESSION: 1. Subtle heterogeneous airspace opacity is almost completely resolved, consistent with nearly resolved COVID airspace disease given clinical history. 2. Cardiomegaly. Electronically Signed   By: Eddie Candle M.D.   On: 08/27/2020 10:36     Assessment & Plan:   Pneumonia due to COVID-19 virus Fatigue:   Stay well hydrated  Stay active  Deep breathing exercises  May start vitamin C daily, vitamin D3 daily, Zinc daily  May take tylenol for fever or pain    Follow up:  Follow up in 4 weeks or sooner if needed - May need repeat imaging        Fenton Foy, NP 09/14/2020

## 2020-09-14 NOTE — Progress Notes (Signed)
Agree with management.  Judy Paquette J Ethin Drummond, MD  

## 2020-09-14 NOTE — Patient Instructions (Signed)
Pneumonia due to COVID-19 virus Fatigue:   Stay well hydrated  Stay active  Deep breathing exercises  May start vitamin C daily, vitamin D3 daily, Zinc daily  May take tylenol for fever or pain    Follow up:  Follow up in 4 weeks or sooner if needed - May need repeat imaging

## 2020-09-15 ENCOUNTER — Telehealth: Payer: Self-pay

## 2020-09-15 MED ORDER — ACYCLOVIR 5 % EX OINT: TOPICAL_OINTMENT | CUTANEOUS | 1 refills | Status: DC

## 2020-09-15 NOTE — Telephone Encounter (Signed)
Spoke to Langdon Place at [facility]. He needs a new RX sent in for 10 Grams of the Acyclovir 5% ointment in order to use discount card. Sending over the new Rx and he will contact p[atient regarding discounted price that should be the same or close to the same price she paid before $20.

## 2020-09-20 ENCOUNTER — Other Ambulatory Visit: Payer: Self-pay | Admitting: Cardiovascular Disease

## 2020-10-05 DIAGNOSIS — H35423 Microcystoid degeneration of retina, bilateral: Secondary | ICD-10-CM | POA: Diagnosis not present

## 2020-10-05 DIAGNOSIS — H353223 Exudative age-related macular degeneration, left eye, with inactive scar: Secondary | ICD-10-CM | POA: Diagnosis not present

## 2020-10-05 DIAGNOSIS — H353211 Exudative age-related macular degeneration, right eye, with active choroidal neovascularization: Secondary | ICD-10-CM | POA: Diagnosis not present

## 2020-10-05 DIAGNOSIS — H43393 Other vitreous opacities, bilateral: Secondary | ICD-10-CM | POA: Diagnosis not present

## 2020-10-09 ENCOUNTER — Other Ambulatory Visit: Payer: Self-pay | Admitting: Internal Medicine

## 2020-10-09 DIAGNOSIS — Z7901 Long term (current) use of anticoagulants: Secondary | ICD-10-CM

## 2020-10-11 ENCOUNTER — Telehealth: Payer: Self-pay | Admitting: Pharmacist

## 2020-10-11 NOTE — Chronic Care Management (AMB) (Signed)
Chronic Care Management Pharmacy Assistant   Name: Judy [name]  MRN: [id number] DOB: Dec 31, 1930   Reason for Encounter: Hypertension Disease State Call   Conditions to be addressed/monitored: HTN   Recent office visits:  09/14/20 Lazaro Arms NP [facility]  Recent consult visits:  [facility] visits:  None in previous 6 months  Medications: Outpatient Encounter Medications as of 10/11/2020  Medication Sig  . acetaminophen (TYLENOL) 500 MG tablet Take 500 mg by mouth every 6 (six) hours as needed.  Marland Kitchen acyclovir ointment (ZOVIRAX) 5 % Use every 4 hours up to 7 days as needed  . aspirin 81 MG tablet Take 81 mg by mouth daily.  Marland Kitchen azelastine (ASTELIN) 0.1 % nasal spray Place 2 sprays into the nose 2 (two) times daily as needed for rhinitis or allergies.  Marland Kitchen b complex vitamins tablet Take 1 tablet by mouth daily.  . benzonatate (TESSALON) 100 MG capsule Take 1 capsule (100 mg total) by mouth 3 (three) times daily as needed.  . diltiazem (CARDIZEM CD) 120 MG 24 hr capsule TAKE 1 CAPSULE BY MOUTH AT BEDTIME - PLEASE MAKE YEARLY APPT FOR REFILLS  . Fexofenadine HCl (ALLEGRA ALLERGY PO) Take 1 tablet by mouth daily.   . furosemide (LASIX) 20 MG tablet Take 1 tablet (20 mg total) by mouth daily. Please make yearly appt with Dr. Johnsie Cancel for April 2022 before anymore refills. Thank you 1st attempt  . isosorbide mononitrate (IMDUR) 30 MG 24 hr tablet TAKE 1 TABLET BY MOUTH AT BEDTIME. PLEASE MAKE APPT FOR REFILLS  . lisinopril (ZESTRIL) 10 MG tablet TAKE 1 TABLET BY MOUTH DAILY - MAKE YEARLY APPT WITH DR. Johnsie Cancel FOR APRIL 2022 BEFORE ANYMORE REFILL  . meclizine (ANTIVERT) 25 MG tablet Take 1 tablet (25 mg total) by mouth 3 (three) times daily as needed for dizziness. (Patient not taking: No sig reported)  . metoprolol succinate (TOPROL-XL) 100 MG 24 hr tablet TAKE 1 TABLET (100 MG TOTAL) BY MOUTH DAILY. TAKE WITH OR IMMEDIATELY FOLLOWING A MEAL.  . Multiple Vitamin (MULTIVITAMIN  WITH MINERALS) TABS tablet Take 1 tablet by mouth daily.  . nitroGLYCERIN (NITROSTAT) 0.4 MG SL tablet PLACE 1 TABLET UNDER TONGUE EVERY 5 MINUTES AS NEEDED FOR CHEST PAIN  . potassium chloride (MICRO-K) 10 MEQ CR capsule TAKE 1 CAPSULE (10 MEQ TOTAL) BY MOUTH DAILY.  . rosuvastatin (CRESTOR) 10 MG tablet TAKE 1 TABLET BY MOUTH EVERY DAY  . sodium chloride (OCEAN) 0.65 % SOLN nasal spray Place 1 spray into both nostrils as needed for congestion.  Marland Kitchen tobramycin (TOBREX) 0.3 % ophthalmic solution Place 1 drop into both eyes daily as needed.  . warfarin (COUMADIN) 1 MG tablet TAKE 2 TABLETS ON FRIDAYS OR AS DIRECTED BY ANTICOAGULATION CLINIC  . warfarin (COUMADIN) 2.5 MG tablet TAKE 1 TABLET DAILY OR TAKE AS DIRECTED BY ANTICOAGULATION CLINIC   No facility-administered encounter medications on file as of 10/11/2020.   Reviewed chart prior to disease state call. Spoke with patient regarding BP  Recent Office Vitals: BP Readings from Last 3 Encounters:  09/14/20 121/78  08/17/20 112/70  08/03/20 113/74   Pulse Readings from Last 3 Encounters:  09/14/20 68  08/17/20 95  08/03/20 95    Wt Readings from Last 3 Encounters:  09/14/20 145 lb (65.8 kg)  02/17/20 164 lb 14.5 oz (74.8 kg)  02/17/20 164 lb 14.4 oz (74.8 kg)     Kidney Function Lab Results  Component Value Date/Time   CREATININE  1.05 (H) 02/17/2020 10:16 AM   CREATININE 1.02 08/15/2019 10:05 AM   CREATININE 0.89 08/12/2018 10:52 AM   GFR 51.26 (L) 08/15/2019 10:05 AM   GFRNONAA 48 (L) 02/17/2020 10:16 AM   GFRAA 55 (L) 02/17/2020 10:16 AM    BMP Latest Ref Rng & Units 02/17/2020 08/15/2019 08/12/2018  Glucose 65 - 99 mg/dL 106(H) 104(H) 105(H)  BUN 7 - 25 mg/dL 16 15 14   Creatinine 0.60 - 0.88 mg/dL 1.05(H) 1.02 0.89  BUN/Creat Ratio 6 - 22 (calc) 15 - -  Sodium 135 - 146 mmol/L 143 140 141  Potassium 3.5 - 5.3 mmol/L 5.1 4.3 4.1  Chloride 98 - 110 mmol/L 109 105 106  CO2 20 - 32 mmol/L 25 27 25   Calcium 8.6 - 10.4 mg/dL 9.9  10.3 9.6    . Current antihypertensive regimen:  Lisinopril 10 mg 1 tab daily, diltiazem  . How often are you checking your Blood Pressure? Patient states that she does not check blood pressure like she should  . Current home BP readings: Patient last blood pressure reading was 121/78  . What recent interventions/DTPs have been made by any provider to improve Blood Pressure control since last CPP Visit: None ID  . Any recent hospitalizations or ED visits since last visit with CPP? None ID  . What diet changes have been made to improve Blood Pressure Control?  Patient states that she has not made any changes to diet  . What exercise is being done to improve your Blood Pressure Control?  Patient states she is unable to exercise because she thinks she has gout. She states that she need an appointment with Dr. Quay Burow and also blood work since it has been so long since she has seen Dr. Quay Burow. I told the patient that I would pass along the information to [facility] the clinical pharmacist  Adherence Review: Is the patient currently on ACE/ARB medication? Yes, Lisinopril Does the patient have >5 day gap between last estimated fill dates? No   Star Rating Drugs: Lisinopril 09/21/20 90 ds Rosuvastatin 09/21/20 90 ds  Ethelene Hal Clinical Pharmacist Assistant [phone]  Time spent:25

## 2020-10-12 ENCOUNTER — Ambulatory Visit (INDEPENDENT_AMBULATORY_CARE_PROVIDER_SITE_OTHER): Payer: Medicare Other | Admitting: Nurse Practitioner

## 2020-10-12 VITALS — BP 118/67 | HR 74 | Temp 97.9°F | Resp 18

## 2020-10-12 DIAGNOSIS — U071 COVID-19: Secondary | ICD-10-CM | POA: Diagnosis not present

## 2020-10-12 DIAGNOSIS — R5383 Other fatigue: Secondary | ICD-10-CM | POA: Insufficient documentation

## 2020-10-12 DIAGNOSIS — J1282 Pneumonia due to coronavirus disease 2019: Secondary | ICD-10-CM

## 2020-10-12 NOTE — Assessment & Plan Note (Signed)
Pneumonia due to COVID-19 virus Fatigue:   Stay well hydrated  Stay active  Deep breathing exercises  May start vitamin C daily, vitamin D3 daily, Zinc daily  May take tylenol for fever or pain    Follow up:  Follow up if needed

## 2020-10-12 NOTE — Patient Instructions (Signed)
Pneumonia due to COVID-19 virus Fatigue:   Stay well hydrated  Stay active  Deep breathing exercises  May start vitamin C daily, vitamin D3 daily, Zinc daily  May take tylenol for fever or pain    Follow up:  Follow up if needed  

## 2020-10-12 NOTE — Progress Notes (Signed)
@Patient  ID: Judy [name], female    DOB: [birth year], 87 y.o.   MRN: [id number]  Chief Complaint  Patient presents with  . Follow-up    Referring provider: Binnie Rail, MD   HPI  Patient presents today for post-COVID care clinic visit follow-up.  Overall patient has been doing well since her last visit at this clinic.  She is much improved.  Her energy and breathing are improving.  Upon exam her lung sounds are clear. Denies f/c/s, n/v/d, hemoptysis, PND, chest pain or edema.     Allergies  Allergen Reactions  . Betadine [Povidone Iodine] Other (See Comments)    "burns eyes"  . Methocarbamol     dizziness    Immunization History  Administered Date(s) Administered  . Fluad Quad(high Dose 65+) 04/13/2020  . Influenza Split 03/22/2012  . Influenza Whole 03/21/2011  . Influenza, High Dose Seasonal PF 04/06/2015, 02/18/2016, 03/02/2017, 03/08/2018  . Influenza,inj,quad, With Preservative 02/26/2019  . Influenza-Unspecified 03/12/2013  . PFIZER(Purple Top)SARS-COV-2 Vaccination 07/02/2019, 07/23/2019  . Pneumococcal Conjugate-13 09/21/2014  . Pneumococcal Polysaccharide-23 05/28/2013  . Zoster 06/12/2009    Past Medical History:  Diagnosis Date  . Atrial fibrillation ([place])    a. chronic anticoag, failed prior DCCV;  b. 03/2010 Echo: EF 55-65%, mod MR, mildly to mod dil LA, mod dil RA, mild to mod MR.  Marland Kitchen CAD (coronary artery disease)    a. BMS to RCA 96';  b. Stent PDA and OM DES 2006  . CAROTID ARTERY DISEASE    a. 11/2011 Carotid U/S:  0-39% bilat dzs.  Marland Kitchen HTN (hypertension)   . Hyperlipidemia   . LBBB (left bundle branch block)   . Macular degeneration, wet ([place]) 10/2012 dx   L>R, follows with retinal specialist->injections monthly.  . Obesity   . Vertigo     Tobacco History: Social History   Tobacco Use  Smoking Status Never Smoker  Smokeless Tobacco Never Used   Counseling given: Yes   Outpatient Encounter Medications as of 10/12/2020  Medication Sig   . acetaminophen (TYLENOL) 500 MG tablet Take 500 mg by mouth every 6 (six) hours as needed.  Marland Kitchen acyclovir ointment (ZOVIRAX) 5 % Use every 4 hours up to 7 days as needed  . aspirin 81 MG tablet Take 81 mg by mouth daily.  Marland Kitchen azelastine (ASTELIN) 0.1 % nasal spray Place 2 sprays into the nose 2 (two) times daily as needed for rhinitis or allergies.  Marland Kitchen b complex vitamins tablet Take 1 tablet by mouth daily.  . benzonatate (TESSALON) 100 MG capsule Take 1 capsule (100 mg total) by mouth 3 (three) times daily as needed.  . diltiazem (CARDIZEM CD) 120 MG 24 hr capsule TAKE 1 CAPSULE BY MOUTH AT BEDTIME - PLEASE MAKE YEARLY APPT FOR REFILLS  . Fexofenadine HCl (ALLEGRA ALLERGY PO) Take 1 tablet by mouth daily.   . furosemide (LASIX) 20 MG tablet Take 1 tablet (20 mg total) by mouth daily. Please make yearly appt with Dr. Johnsie Cancel for April 2022 before anymore refills. Thank you 1st attempt  . isosorbide mononitrate (IMDUR) 30 MG 24 hr tablet TAKE 1 TABLET BY MOUTH AT BEDTIME. PLEASE MAKE APPT FOR REFILLS  . lisinopril (ZESTRIL) 10 MG tablet TAKE 1 TABLET BY MOUTH DAILY - MAKE YEARLY APPT WITH DR. Johnsie Cancel FOR APRIL 2022 BEFORE ANYMORE REFILL  . meclizine (ANTIVERT) 25 MG tablet Take 1 tablet (25 mg total) by mouth 3 (three) times daily as needed for dizziness. (Patient not taking: No sig  reported)  . metoprolol succinate (TOPROL-XL) 100 MG 24 hr tablet TAKE 1 TABLET (100 MG TOTAL) BY MOUTH DAILY. TAKE WITH OR IMMEDIATELY FOLLOWING A MEAL.  . Multiple Vitamin (MULTIVITAMIN WITH MINERALS) TABS tablet Take 1 tablet by mouth daily.  . nitroGLYCERIN (NITROSTAT) 0.4 MG SL tablet PLACE 1 TABLET UNDER TONGUE EVERY 5 MINUTES AS NEEDED FOR CHEST PAIN  . potassium chloride (MICRO-K) 10 MEQ CR capsule TAKE 1 CAPSULE (10 MEQ TOTAL) BY MOUTH DAILY.  . rosuvastatin (CRESTOR) 10 MG tablet TAKE 1 TABLET BY MOUTH EVERY DAY  . sodium chloride (OCEAN) 0.65 % SOLN nasal spray Place 1 spray into both nostrils as needed for  congestion.  Marland Kitchen tobramycin (TOBREX) 0.3 % ophthalmic solution Place 1 drop into both eyes daily as needed.  . warfarin (COUMADIN) 1 MG tablet TAKE 2 TABLETS ON FRIDAYS OR AS DIRECTED BY ANTICOAGULATION CLINIC  . warfarin (COUMADIN) 2.5 MG tablet TAKE 1 TABLET DAILY OR TAKE AS DIRECTED BY ANTICOAGULATION CLINIC   No facility-administered encounter medications on file as of 10/12/2020.     Review of Systems  Review of Systems  Constitutional: Negative.  Negative for fatigue and fever.  HENT: Negative.   Respiratory: Negative for cough and shortness of breath.   Cardiovascular: Negative.  Negative for chest pain, palpitations and leg swelling.  Gastrointestinal: Negative.   Allergic/Immunologic: Negative.   Neurological: Negative.   Psychiatric/Behavioral: Negative.        Physical Exam  BP 118/67   Pulse 74   Temp 97.9 F (36.6 C)   Resp 18   SpO2 100%   Wt Readings from Last 5 Encounters:  09/14/20 145 lb (65.8 kg)  02/17/20 164 lb 14.5 oz (74.8 kg)  02/17/20 164 lb 14.4 oz (74.8 kg)  09/29/19 166 lb (75.3 kg)  08/15/19 171 lb (77.6 kg)     Physical Exam Vitals and nursing note reviewed.  Constitutional:      General: She is not in acute distress.    Appearance: She is well-developed.  Cardiovascular:     Rate and Rhythm: Normal rate and regular rhythm.  Pulmonary:     Effort: Pulmonary effort is normal.     Breath sounds: Normal breath sounds.  Musculoskeletal:     Right lower leg: No edema.     Left lower leg: No edema.  Neurological:     Mental Status: She is alert and oriented to person, place, and time.  Psychiatric:        Mood and Affect: Mood normal.        Behavior: Behavior normal.        Assessment & Plan:   Pneumonia due to COVID-19 virus Pneumonia due to COVID-19 virus Fatigue:   Stay well hydrated  Stay active  Deep breathing exercises  May start vitamin C daily, vitamin D3 daily, Zinc daily  May take tylenol for fever  or pain    Follow up:  Follow up if needed      Fenton Foy, NP 10/12/2020

## 2020-10-12 NOTE — Progress Notes (Signed)
Subjective:    Patient ID: Judy [name], female    DOB: 26-Sep-1930, 87 y.o.   MRN: [id number]  HPI The patient is here for an acute visit.   Foot pain - 4-5 days ago she noticed swelling and pain in her first toe joint.  There is mild redness.  It hurts to walk and move her toe. No injuries.  No prior episodes.  Has taken tylenol.  No fever or chills.  No other joints involved.  No change in medications.  Medications and allergies reviewed with patient and updated if appropriate.  Patient Active Problem List   Diagnosis Date Noted  . Fatigue 10/12/2020  . Pneumonia due to COVID-19 virus 08/03/2020  . Cold sore 07/27/2020  . COVID 07/19/2020  . History of R cerebellar stroke 08/11/2018  . B12 deficiency 02/04/2018  . Allergic rhinitis 08/06/2017  . Diabetes ([place]) 09/27/2015  . Peripheral neuropathy 09/22/2015  . Encounter for therapeutic drug monitoring 07/25/2013  . Macular degeneration, wet ([place])   . LBBB (left bundle branch block) 03/21/2011  . Long term (current) use of anticoagulants 09/08/2010  . Carotid artery disease ([place]) 08/31/2009  . Mixed hyperlipidemia 12/16/2008  . Essential hypertension, benign 12/16/2008  . CAD, NATIVE VESSEL 12/16/2008  . ATRIAL FIBRILLATION 12/16/2008    Current Outpatient Medications on File Prior to Visit  Medication Sig Dispense Refill  . acetaminophen (TYLENOL) 500 MG tablet Take 500 mg by mouth every 6 (six) hours as needed.    Marland Kitchen aspirin 81 MG tablet Take 81 mg by mouth daily.    Marland Kitchen azelastine (ASTELIN) 0.1 % nasal spray Place 2 sprays into the nose 2 (two) times daily as needed for rhinitis or allergies.    Marland Kitchen b complex vitamins tablet Take 1 tablet by mouth daily.    Marland Kitchen diltiazem (CARDIZEM CD) 120 MG 24 hr capsule TAKE 1 CAPSULE BY MOUTH AT BEDTIME - PLEASE MAKE YEARLY APPT FOR REFILLS 90 capsule 0  . Fexofenadine HCl (ALLEGRA ALLERGY PO) Take 1 tablet by mouth daily.     . furosemide (LASIX) 20 MG tablet Take 1 tablet (20 mg  total) by mouth daily. Please make yearly appt with Dr. Johnsie Cancel for April 2022 before anymore refills. Thank you 1st attempt 90 tablet 0  . isosorbide mononitrate (IMDUR) 30 MG 24 hr tablet TAKE 1 TABLET BY MOUTH AT BEDTIME. PLEASE MAKE APPT FOR REFILLS 90 tablet 0  . lisinopril (ZESTRIL) 10 MG tablet TAKE 1 TABLET BY MOUTH DAILY - MAKE YEARLY APPT WITH DR. Johnsie Cancel FOR APRIL 2022 BEFORE ANYMORE REFILL 90 tablet 0  . meclizine (ANTIVERT) 25 MG tablet Take 1 tablet (25 mg total) by mouth 3 (three) times daily as needed for dizziness. 30 tablet 5  . metoprolol succinate (TOPROL-XL) 100 MG 24 hr tablet TAKE 1 TABLET (100 MG TOTAL) BY MOUTH DAILY. TAKE WITH OR IMMEDIATELY FOLLOWING A MEAL. 90 tablet 1  . Multiple Vitamin (MULTIVITAMIN WITH MINERALS) TABS tablet Take 1 tablet by mouth daily.    . nitroGLYCERIN (NITROSTAT) 0.4 MG SL tablet PLACE 1 TABLET UNDER TONGUE EVERY 5 MINUTES AS NEEDED FOR CHEST PAIN 25 tablet 8  . potassium chloride (MICRO-K) 10 MEQ CR capsule TAKE 1 CAPSULE (10 MEQ TOTAL) BY MOUTH DAILY. 90 capsule 0  . rosuvastatin (CRESTOR) 10 MG tablet TAKE 1 TABLET BY MOUTH EVERY DAY 90 tablet 2  . sodium chloride (OCEAN) 0.65 % SOLN nasal spray Place 1 spray into both nostrils as needed for congestion.    Marland Kitchen  tobramycin (TOBREX) 0.3 % ophthalmic solution Place 1 drop into both eyes daily as needed.    . warfarin (COUMADIN) 1 MG tablet TAKE 2 TABLETS ON FRIDAYS OR AS DIRECTED BY ANTICOAGULATION CLINIC 30 tablet 0  . warfarin (COUMADIN) 2.5 MG tablet TAKE 1 TABLET DAILY OR TAKE AS DIRECTED BY ANTICOAGULATION CLINIC 90 tablet 1   No current facility-administered medications on file prior to visit.    Past Medical History:  Diagnosis Date  . Atrial fibrillation ([place])    a. chronic anticoag, failed prior DCCV;  b. 03/2010 Echo: EF 55-65%, mod MR, mildly to mod dil LA, mod dil RA, mild to mod MR.  Marland Kitchen CAD (coronary artery disease)    a. BMS to RCA 96';  b. Stent PDA and OM DES 2006  . CAROTID  ARTERY DISEASE    a. 11/2011 Carotid U/S:  0-39% bilat dzs.  Marland Kitchen HTN (hypertension)   . Hyperlipidemia   . LBBB (left bundle branch block)   . Macular degeneration, wet ([place]) 10/2012 dx   L>R, follows with retinal specialist->injections monthly.  . Obesity   . Vertigo     Past Surgical History:  Procedure Laterality Date  . [place], 2006 x2   Drug-eluting stent placement to the PDA, drug-eluting stent  placement to the first obtuse  marginal, StarClose closure of the right common femoral arteriotomy site. Successful drug-eluting stent  placement in  both the posterior descending artery and the obtuse marginal. The lesion was directly stented using a 2.5 x 16 mm Taxus deployed at 14 atmospheres.   Marland Kitchen EYE SURGERY     Tear duct stent    Social History   Socioeconomic History  . Marital status: Widowed    Spouse name: Not on file  . Number of children: Not on file  . Years of education: Not on file  . Highest education level: Not on file  Occupational History  . Not on file  Tobacco Use  . Smoking status: Never Smoker  . Smokeless tobacco: Never Used  Vaping Use  . Vaping Use: Never used  Substance and Sexual Activity  . Alcohol use: No  . Drug use: No  . Sexual activity: Not on file  Other Topics Concern  . Not on file  Social History Narrative   Lives in [place] by herself.  She is active around the house w/o symptoms or limitations.   Social Determinants of Health   Financial Resource Strain: Low Risk   . Difficulty of Paying Living Expenses: Not hard at all  Food Insecurity: No Food Insecurity  . Worried About Charity fundraiser in the Last Year: Never true  . Ran Out of Food in the Last Year: Never true  Transportation Needs: No Transportation Needs  . Lack of Transportation (Medical): No  . Lack of Transportation (Non-Medical): No  Physical Activity: Sufficiently Active  . Days of Exercise per Week: 5 days  . Minutes of Exercise per Session: 30  min  Stress: Stress Concern Present  . Feeling of Stress : Rather much  Social Connections: Moderately Integrated  . Frequency of Communication with Friends and Family: More than three times a week  . Frequency of Social Gatherings with Friends and Family: More than three times a week  . Attends Religious Services: 1 to 4 times per year  . Active Member of Clubs or Organizations: Yes  . Attends Archivist Meetings: 1 to 4 times per year  . Marital  Status: Widowed    Family History  Problem Relation Age of Onset  . Arthritis Mother        died in her 98's  . Arthritis Father        died in [place].  Marland Kitchen CAD Brother        deceased  . CAD Brother        deceased  . CAD Sister        deceased    Review of Systems     Objective:   Vitals:   10/13/20 1010  BP: 120/60  Pulse: 81  Temp: 98.1 F (36.7 C)  SpO2: 97%   BP Readings from Last 3 Encounters:  10/13/20 120/60  10/12/20 118/67  09/14/20 121/78   Wt Readings from Last 3 Encounters:  10/13/20 145 lb 3.2 oz (65.9 kg)  09/14/20 145 lb (65.8 kg)  02/17/20 164 lb 14.5 oz (74.8 kg)   Body mass index is 25.72 kg/m.   Physical Exam Constitutional:      General: She is not in acute distress.    Appearance: Normal appearance. She is not ill-appearing.  HENT:     Head: Normocephalic and atraumatic.  Musculoskeletal:     Comments: Right first MCP joint swollen, erythematous and slightly warm to touch.  Increased pain with active and passive movement and applying pressure on the foot  Skin:    General: Skin is warm and dry.  Neurological:     Mental Status: She is alert.            Assessment & Plan:    See Problem List for Assessment and Plan of chronic medical problems.    This visit occurred during the SARS-CoV-2 public health emergency.  Safety protocols were in place, including screening questions prior to the visit, additional usage of staff PPE, and extensive cleaning of exam room while  observing appropriate contact time as indicated for disinfecting solutions.

## 2020-10-13 ENCOUNTER — Encounter: Payer: Self-pay | Admitting: Internal Medicine

## 2020-10-13 ENCOUNTER — Other Ambulatory Visit: Payer: Self-pay

## 2020-10-13 ENCOUNTER — Ambulatory Visit (INDEPENDENT_AMBULATORY_CARE_PROVIDER_SITE_OTHER): Payer: Medicare Other | Admitting: Internal Medicine

## 2020-10-13 VITALS — BP 120/60 | HR 81 | Temp 98.1°F | Ht 63.0 in | Wt 145.2 lb

## 2020-10-13 DIAGNOSIS — M109 Gout, unspecified: Secondary | ICD-10-CM | POA: Diagnosis not present

## 2020-10-13 LAB — COMPREHENSIVE METABOLIC PANEL
ALT: 10 U/L (ref 0–35)
AST: 17 U/L (ref 0–37)
Albumin: 3.9 g/dL (ref 3.5–5.2)
Alkaline Phosphatase: 64 U/L (ref 39–117)
BUN: 17 mg/dL (ref 6–23)
CO2: 28 mEq/L (ref 19–32)
Calcium: 9.7 mg/dL (ref 8.4–10.5)
Chloride: 105 mEq/L (ref 96–112)
Creatinine, Ser: 1.02 mg/dL (ref 0.40–1.20)
GFR: 49.23 mL/min — ABNORMAL LOW (ref >60.00)
Glucose, Bld: 91 mg/dL (ref 70–99)
Potassium: 4.3 mEq/L (ref 3.5–5.1)
Sodium: 141 mEq/L (ref 135–145)
Total Bilirubin: 0.4 mg/dL (ref 0.2–1.2)
Total Protein: 6.8 g/dL (ref 6.0–8.3)

## 2020-10-13 LAB — URIC ACID: Uric Acid, Serum: 7.1 mg/dL — ABNORMAL HIGH (ref 2.4–7.0)

## 2020-10-13 MED ORDER — COLCHICINE 0.6 MG PO CAPS: ORAL_CAPSULE | ORAL | 0 refills | Status: DC

## 2020-10-13 NOTE — Assessment & Plan Note (Signed)
Acute First episode of gout Right first MCP joint Check CMP, uric acid level No obvious cause-possibly related to decreased kidney function Colchicine 1.2 mg x 1, 1 hour later 0.6 mg-if not covered or not affected she will let me know and we will do prednisone taper

## 2020-10-13 NOTE — Patient Instructions (Addendum)
  Blood work was ordered.     Medications changes include :   Colchicine as prescribed - if this is not covered  Your prescription(s) have been submitted to your pharmacy. Please take as directed and contact our office if you believe you are having problem(s) with the medication(s).

## 2020-10-17 ENCOUNTER — Other Ambulatory Visit: Payer: Self-pay | Admitting: Internal Medicine

## 2020-10-17 DIAGNOSIS — Z7901 Long term (current) use of anticoagulants: Secondary | ICD-10-CM

## 2020-10-24 ENCOUNTER — Other Ambulatory Visit: Payer: Self-pay | Admitting: Internal Medicine

## 2020-10-24 DIAGNOSIS — Z7901 Long term (current) use of anticoagulants: Secondary | ICD-10-CM

## 2020-10-26 ENCOUNTER — Ambulatory Visit (INDEPENDENT_AMBULATORY_CARE_PROVIDER_SITE_OTHER): Payer: Medicare Other | Admitting: General Practice

## 2020-10-26 ENCOUNTER — Other Ambulatory Visit: Payer: Self-pay

## 2020-10-26 DIAGNOSIS — I4891 Unspecified atrial fibrillation: Secondary | ICD-10-CM | POA: Diagnosis not present

## 2020-10-26 DIAGNOSIS — Z7901 Long term (current) use of anticoagulants: Secondary | ICD-10-CM

## 2020-10-26 LAB — POCT INR: INR: 2.3 (ref 2.0–3.0)

## 2020-10-26 NOTE — Progress Notes (Signed)
Agree with management.  Ardelle Haliburton J Krystena Reitter, MD  

## 2020-10-26 NOTE — Patient Instructions (Addendum)
Pre visit review using our clinic review tool, if applicable. No additional management support is needed unless otherwise documented below in the visit note.  Continue to take 2.5 mg daily except take 2 mg on Fridays.  Re-check in 6 weeks.

## 2020-11-17 ENCOUNTER — Telehealth: Payer: Self-pay

## 2020-11-17 NOTE — Chronic Care Management (AMB) (Signed)
Chronic Care Management Pharmacy Assistant   Name: Judy [name]  MRN: [id number] DOB: Mar 03, 1931   Reason for Encounter: Disease State General Call     Recent office visits:  10/13/20 Dr. Quay Burow ordered Colchicine 0.6 mg  Recent consult visits:  10/12/20 Perrin Maltese NP  Hospital visits:  None in previous 6 months  Medications: Outpatient Encounter Medications as of 11/17/2020  Medication Sig  . warfarin (COUMADIN) 1 MG tablet TAKE 2 TABLETS ON FRIDAYS OR AS DIRECTED BY ANTICOAGULATION CLINIC  . acetaminophen (TYLENOL) 500 MG tablet Take 500 mg by mouth every 6 (six) hours as needed.  Marland Kitchen aspirin 81 MG tablet Take 81 mg by mouth daily.  Marland Kitchen azelastine (ASTELIN) 0.1 % nasal spray Place 2 sprays into the nose 2 (two) times daily as needed for rhinitis or allergies.  Marland Kitchen b complex vitamins tablet Take 1 tablet by mouth daily.  . Colchicine (MITIGARE) 0.6 MG CAPS Take 2 caps once and then one hour later take 1 cap  . diltiazem (CARDIZEM CD) 120 MG 24 hr capsule TAKE 1 CAPSULE BY MOUTH AT BEDTIME - PLEASE MAKE YEARLY APPT FOR REFILLS  . Fexofenadine HCl (ALLEGRA ALLERGY PO) Take 1 tablet by mouth daily.   . furosemide (LASIX) 20 MG tablet Take 1 tablet (20 mg total) by mouth daily. Please make yearly appt with Dr. Johnsie Cancel for April 2022 before anymore refills. Thank you 1st attempt  . isosorbide mononitrate (IMDUR) 30 MG 24 hr tablet TAKE 1 TABLET BY MOUTH AT BEDTIME. PLEASE MAKE APPT FOR REFILLS  . lisinopril (ZESTRIL) 10 MG tablet TAKE 1 TABLET BY MOUTH DAILY - MAKE YEARLY APPT WITH DR. Johnsie Cancel FOR APRIL 2022 BEFORE ANYMORE REFILL  . meclizine (ANTIVERT) 25 MG tablet Take 1 tablet (25 mg total) by mouth 3 (three) times daily as needed for dizziness.  . metoprolol succinate (TOPROL-XL) 100 MG 24 hr tablet TAKE 1 TABLET (100 MG TOTAL) BY MOUTH DAILY. TAKE WITH OR IMMEDIATELY FOLLOWING A MEAL.  . Multiple Vitamin (MULTIVITAMIN WITH MINERALS) TABS tablet Take 1 tablet by mouth daily.  .  nitroGLYCERIN (NITROSTAT) 0.4 MG SL tablet PLACE 1 TABLET UNDER TONGUE EVERY 5 MINUTES AS NEEDED FOR CHEST PAIN  . potassium chloride (MICRO-K) 10 MEQ CR capsule TAKE 1 CAPSULE (10 MEQ TOTAL) BY MOUTH DAILY.  . rosuvastatin (CRESTOR) 10 MG tablet TAKE 1 TABLET BY MOUTH EVERY DAY  . sodium chloride (OCEAN) 0.65 % SOLN nasal spray Place 1 spray into both nostrils as needed for congestion.  Marland Kitchen tobramycin (TOBREX) 0.3 % ophthalmic solution Place 1 drop into both eyes daily as needed.  . warfarin (COUMADIN) 2.5 MG tablet TAKE 1 TABLET DAILY OR TAKE AS DIRECTED BY ANTICOAGULATION CLINIC   No facility-administered encounter medications on file as of 11/17/2020.   Pharmacist Review  Have you had any problems recently with your health? Patient states that she does not have any new health issues. She did say that a few weeks ago she had gout but Dr. Quay Burow prescribed colchicine and after two days it went away  Have you had any problems with your pharmacy? Patient states that she does not have any problems with getting medication or the cost of medication from the pharmacy  What issues or side effects are you having with your medications? Patient states that she does not have any side effects from medications  What would you like me to pass along to [facility] for them to help you with?  Patient states that  she is doing well and does not have any concerns about health or medications at this time  What can we do to take care of you better? Patient states that if she has any changes in health she will contact Dr. Quay Burow office  Star Rating Drugs: Lisinopril 09/21/20 90 d Rosuvastatin 09/21/20 90 d  [place] Pharmacist Assistant [phone]  Time spent:20

## 2020-11-25 DIAGNOSIS — H20012 Primary iridocyclitis, left eye: Secondary | ICD-10-CM | POA: Diagnosis not present

## 2020-11-25 DIAGNOSIS — H16102 Unspecified superficial keratitis, left eye: Secondary | ICD-10-CM | POA: Diagnosis not present

## 2020-11-30 DIAGNOSIS — H35372 Puckering of macula, left eye: Secondary | ICD-10-CM | POA: Diagnosis not present

## 2020-11-30 DIAGNOSIS — H353223 Exudative age-related macular degeneration, left eye, with inactive scar: Secondary | ICD-10-CM | POA: Diagnosis not present

## 2020-11-30 DIAGNOSIS — H353211 Exudative age-related macular degeneration, right eye, with active choroidal neovascularization: Secondary | ICD-10-CM | POA: Diagnosis not present

## 2020-11-30 DIAGNOSIS — H43813 Vitreous degeneration, bilateral: Secondary | ICD-10-CM | POA: Diagnosis not present

## 2020-12-02 DIAGNOSIS — H20012 Primary iridocyclitis, left eye: Secondary | ICD-10-CM | POA: Diagnosis not present

## 2020-12-02 DIAGNOSIS — H16102 Unspecified superficial keratitis, left eye: Secondary | ICD-10-CM | POA: Diagnosis not present

## 2020-12-07 ENCOUNTER — Other Ambulatory Visit: Payer: Self-pay

## 2020-12-07 ENCOUNTER — Ambulatory Visit (INDEPENDENT_AMBULATORY_CARE_PROVIDER_SITE_OTHER): Payer: Medicare Other | Admitting: General Practice

## 2020-12-07 DIAGNOSIS — Z7901 Long term (current) use of anticoagulants: Secondary | ICD-10-CM

## 2020-12-07 DIAGNOSIS — I4891 Unspecified atrial fibrillation: Secondary | ICD-10-CM | POA: Diagnosis not present

## 2020-12-07 LAB — POCT INR: INR: 1.5 — AB (ref 2.0–3.0)

## 2020-12-07 NOTE — Progress Notes (Signed)
Agree with management.  Tierre Gerard J Esperanza Madrazo, MD  

## 2020-12-07 NOTE — Patient Instructions (Signed)
Pre visit review using our clinic review tool, if applicable. No additional management support is needed unless otherwise documented below in the visit note.  Take 2 (2.5 mg tablets) today and tomorrow (6/28 and 6/29) and then continue to take 2.5 mg daily except take 2 mg on Fridays.  Re-check in 4 weeks.

## 2020-12-19 NOTE — Progress Notes (Signed)
Date:  12/28/2020   ID:  Judy [name], DOB 1930-08-22, MRN [id number]  PCP:  Binnie Rail, MD  Cardiologist:   Johnsie Cancel Electrophysiologist:  None   Evaluation Performed:  Follow-Up Visit  Chief Complaint:  CAD/AFib  History of Present Illness:    87 y.o. history of CAD. First stent RCA 1996 and PDA/OM in 2006. Distant cerebellar stroke No carotid disease. Chronic afib on coumadin. Activity limited by macular degeneration and peripheral neuropathy. Has seen primary and neurology on B12 ? From diet controlled DM as well On statin for HLD  Statin Changed to crestor due to interaction of simvastatin with cardizem    No palpitations, bleeding , chest pain   She has no cardiac  complaints    Gout of right first MCP May 2022 Rx with Colchicine Uric acid 7.1 just above normal limit of 7.0   Lost some weight with tonsillitis and COVID latter in February  Upset about primary care lack Of being proactive with Rx of COVID    Past Medical History:  Diagnosis Date   Atrial fibrillation ([place])    a. chronic anticoag, failed prior DCCV;  b. 03/2010 Echo: EF 55-65%, mod MR, mildly to mod dil LA, mod dil RA, mild to mod MR.   CAD (coronary artery disease)    a. BMS to RCA 96';  b. Stent PDA and OM DES 2006   CAROTID ARTERY DISEASE    a. 11/2011 Carotid U/S:  0-39% bilat dzs.   HTN (hypertension)    Hyperlipidemia    LBBB (left bundle branch block)    Macular degeneration, wet ([place]) 10/2012 dx   L>R, follows with retinal specialist->injections monthly.   Obesity    Vertigo    Past Surgical History:  Procedure Laterality Date   [place], 2006 x2   Drug-eluting stent placement to the PDA, drug-eluting stent  placement to the first obtuse  marginal, StarClose closure of the right common femoral arteriotomy site. Successful drug-eluting stent  placement in  both the posterior descending artery and the obtuse marginal. The lesion was directly stented using a 2.5 x 16  mm Taxus deployed at 14 atmospheres.    EYE SURGERY     Tear duct stent     Current Meds  Medication Sig   acetaminophen (TYLENOL) 500 MG tablet Take 500 mg by mouth every 6 (six) hours as needed.   aspirin 81 MG tablet Take 81 mg by mouth daily.   azelastine (ASTELIN) 0.1 % nasal spray Place 2 sprays into the nose 2 (two) times daily as needed for rhinitis or allergies.   b complex vitamins tablet Take 1 tablet by mouth daily.   Colchicine (MITIGARE) 0.6 MG CAPS Take 2 caps once and then one hour later take 1 cap   diltiazem (CARDIZEM CD) 120 MG 24 hr capsule Take 1 capsule (120 mg total) by mouth daily. Please keep upcoming appointment for further refills   Fexofenadine HCl (ALLEGRA ALLERGY PO) Take 1 tablet by mouth daily.    furosemide (LASIX) 20 MG tablet Take 1 tablet (20 mg total) by mouth daily. Please keep upcoming appointment for future refills. Thank you   isosorbide mononitrate (IMDUR) 30 MG 24 hr tablet TAKE 1 TABLET BY MOUTH AT BEDTIME. PLEASE MAKE APPT FOR REFILLS   lisinopril (ZESTRIL) 10 MG tablet TAKE 1 TABLET BY MOUTH DAILY - MAKE YEARLY APPT WITH DR. Johnsie Cancel FOR APRIL 2022 BEFORE ANYMORE REFILL   meclizine (ANTIVERT) 25  MG tablet Take 1 tablet (25 mg total) by mouth 3 (three) times daily as needed for dizziness.   metoprolol succinate (TOPROL-XL) 100 MG 24 hr tablet TAKE 1 TABLET BY MOUTH DAILY. TAKE WITH OR IMMEDIATELY FOLLOWING A MEAL.   Multiple Vitamin (MULTIVITAMIN WITH MINERALS) TABS tablet Take 1 tablet by mouth daily.   nitroGLYCERIN (NITROSTAT) 0.4 MG SL tablet PLACE 1 TABLET UNDER TONGUE EVERY 5 MINUTES AS NEEDED FOR CHEST PAIN   potassium chloride (MICRO-K) 10 MEQ CR capsule TAKE 1 CAPSULE (10 MEQ TOTAL) BY MOUTH DAILY.   rosuvastatin (CRESTOR) 10 MG tablet TAKE 1 TABLET BY MOUTH EVERY DAY   sodium chloride (OCEAN) 0.65 % SOLN nasal spray Place 1 spray into both nostrils as needed for congestion.   tobramycin (TOBREX) 0.3 % ophthalmic solution Place 1 drop into  both eyes daily as needed.   warfarin (COUMADIN) 1 MG tablet TAKE 2 TABLETS ON FRIDAYS OR AS DIRECTED BY ANTICOAGULATION CLINIC   warfarin (COUMADIN) 2.5 MG tablet TAKE 1 TABLET DAILY OR TAKE AS DIRECTED BY ANTICOAGULATION CLINIC     Allergies:   Betadine [povidone iodine] and Methocarbamol   Social History   Tobacco Use   Smoking status: Never   Smokeless tobacco: Never  Vaping Use   Vaping Use: Never used  Substance Use Topics   Alcohol use: No   Drug use: No     Family Hx: The patient's family history includes Arthritis in her father and mother; CAD in her brother, brother, and sister.  ROS:   Please see the history of present illness.     All other systems reviewed and are negative.   Prior CV studies:   The following studies were reviewed today:  Carotid 12/18/18 plaque no stenosis   Labs/Other Tests and Data Reviewed:    EKG:  9//2/20  afib old IMI nonspecific ST chagnes 12/28/2020 afib rate 73 old IMI   Recent Labs: 02/17/2020: Hemoglobin 13.5; Platelets 211; TSH 2.29 10/13/2020: ALT 10; BUN 17; Creatinine, Ser 1.02; Potassium 4.3; Sodium 141   Recent Lipid Panel Lab Results  Component Value Date/Time   CHOL 178 02/17/2020 10:16 AM   CHOL 178 05/12/2019 09:25 AM   TRIG 199 (H) 02/17/2020 10:16 AM   HDL 50 02/17/2020 10:16 AM   HDL 47 05/12/2019 09:25 AM   CHOLHDL 3.6 02/17/2020 10:16 AM   LDLCALC 98 02/17/2020 10:16 AM   LDLDIRECT 97.0 08/15/2019 10:05 AM    Wt Readings from Last 3 Encounters:  12/28/20 64.9 kg  10/13/20 65.9 kg  09/14/20 65.8 kg     Objective:    Vital Signs:  BP 130/70   Pulse (!) 55   Ht 5\' 3"  (1.6 m)   Wt 64.9 kg   BMI 25.33 kg/m    Affect appropriate Healthy:  appears stated age 25: normal Neck supple with no adenopathy JVP normal no bruits no thyromegaly Lungs clear with no wheezing and good diaphragmatic motion Heart:  S1/S2 no murmur, no rub, gallop or click PMI normal Abdomen: benighn, BS positve, no tenderness,  no AAA no bruit.  No HSM or HJR Distal pulses intact with no bruits No edema Neuro non-focal Skin warm and dry No muscular weakness   ASSESSMENT & PLAN:    CAD:  Distant intervention to RCA and OM last in 2006 no angina continue medical RX given age  HLD:  Continue statin LDL 97  08/15/19   DM:  Diet controlled A1c 6.12 August 2018  Afib:  Decrease  Toprol to 50 mg daily given relatively slow HR INR consistently Rx  2.0 last on 08/26/19   Neuropathy:  On B12 ? Related to DM f/u neurology   Macular Degeneration: f/u Dr Tye Savoy has had injections and improved  COVID-19 Education: The signs and symptoms of COVID-19 were discussed with the patient and how to seek care for testing (follow up with PCP or arrange E-visit).  The importance of social distancing was discussed today.   Medication Adjustments/Labs and Tests Ordered: Current medicines are reviewed at length with the patient today.  Concerns regarding medicines are outlined above.   Tests Ordered:  None   Medication Changes:  Decrease Toprol to 50 mg daily   Disposition:  Follow u in 3 months   Signed, Jenkins Rouge, MD  12/28/2020 10:55 AM    [place]

## 2020-12-21 ENCOUNTER — Other Ambulatory Visit: Payer: Self-pay | Admitting: Internal Medicine

## 2020-12-21 ENCOUNTER — Other Ambulatory Visit: Payer: Self-pay | Admitting: Cardiovascular Disease

## 2020-12-21 DIAGNOSIS — Z7901 Long term (current) use of anticoagulants: Secondary | ICD-10-CM

## 2020-12-28 ENCOUNTER — Encounter: Payer: Self-pay | Admitting: Cardiovascular Disease

## 2020-12-28 ENCOUNTER — Other Ambulatory Visit: Payer: Self-pay

## 2020-12-28 ENCOUNTER — Ambulatory Visit (INDEPENDENT_AMBULATORY_CARE_PROVIDER_SITE_OTHER): Payer: Medicare Other | Admitting: Cardiovascular Disease

## 2020-12-28 VITALS — BP 130/70 | HR 55 | Ht 63.0 in | Wt 143.0 lb

## 2020-12-28 DIAGNOSIS — E782 Mixed hyperlipidemia: Secondary | ICD-10-CM

## 2020-12-28 DIAGNOSIS — I4891 Unspecified atrial fibrillation: Secondary | ICD-10-CM

## 2020-12-28 DIAGNOSIS — R001 Bradycardia, unspecified: Secondary | ICD-10-CM | POA: Diagnosis not present

## 2020-12-28 DIAGNOSIS — I2510 Atherosclerotic heart disease of native coronary artery without angina pectoris: Secondary | ICD-10-CM

## 2020-12-28 MED ORDER — METOPROLOL SUCCINATE ER 50 MG PO TB24: 50.0000 mg | ORAL_TABLET | Freq: Every day | ORAL | 3 refills | Status: DC

## 2020-12-28 NOTE — Patient Instructions (Signed)
Medication Instructions:  Your physician has recommended you make the following change in your medication:  1-Decrease Metoprolol 50 mg by mouth daily.  *If you need a refill on your cardiac medications before your next appointment, please call your pharmacy*  Lab Work: If you have labs (blood work) drawn today and your tests are completely normal, you will receive your results only by: [place] (if you have MyChart) OR A paper copy in the mail If you have any lab test that is abnormal or we need to change your treatment, we will call you to review the results.  Testing/Procedures: None ordered today.  Follow-Up: At [facility], you and your health needs are our priority.  As part of our continuing mission to provide you with exceptional heart care, we have created designated Provider Care Teams.  These Care Teams include your primary Cardiologist (physician) and Advanced Practice Providers (APPs -  Physician Assistants and Nurse Practitioners) who all work together to provide you with the care you need, when you need it.  We recommend signing up for the patient portal called "MyChart".  Sign up information is provided on this After Visit Summary.  MyChart is used to connect with patients for Virtual Visits (Telemedicine).  Patients are able to view lab/test results, encounter notes, upcoming appointments, etc.  Non-urgent messages can be sent to your provider as well.   To learn more about what you can do with MyChart, go to [email or web address].    Your next appointment:   3 month(s)  The format for your next appointment:   In Person  Provider:   You may see Jenkins Rouge, MD or one of the following Advanced Practice Providers on your designated Care Team:   Cecilie Kicks, NP

## 2021-01-03 DIAGNOSIS — H20012 Primary iridocyclitis, left eye: Secondary | ICD-10-CM | POA: Diagnosis not present

## 2021-01-03 DIAGNOSIS — H16102 Unspecified superficial keratitis, left eye: Secondary | ICD-10-CM | POA: Diagnosis not present

## 2021-01-04 ENCOUNTER — Ambulatory Visit (INDEPENDENT_AMBULATORY_CARE_PROVIDER_SITE_OTHER): Payer: Medicare Other | Admitting: General Practice

## 2021-01-04 DIAGNOSIS — Z7901 Long term (current) use of anticoagulants: Secondary | ICD-10-CM | POA: Diagnosis not present

## 2021-01-04 DIAGNOSIS — I4891 Unspecified atrial fibrillation: Secondary | ICD-10-CM

## 2021-01-04 LAB — POCT INR: INR: 1.8 — AB (ref 2.0–3.0)

## 2021-01-04 NOTE — Patient Instructions (Addendum)
Pre visit review using our clinic review tool, if applicable. No additional management support is needed unless otherwise documented below in the visit note.  Change dosage and take (2.5 mg) 1 tablet daily.   Re-check in 4 weeks.

## 2021-01-05 NOTE — Progress Notes (Signed)
Agree with management.  Judy [name] Judy Madyx [name], MD  

## 2021-01-12 ENCOUNTER — Telehealth: Payer: Self-pay | Admitting: Pharmacist

## 2021-01-12 NOTE — Progress Notes (Signed)
Chronic Care Management Pharmacy Assistant   Name: Judy [name]  MRN: [id number] DOB: [birth year]   Reason for Encounter: Disease State   Conditions to be addressed/monitored: HTN   Recent office visits:  None ID  Recent consult visits:  12/28/20 Dr. Jenkins Rouge Cardiology  [facility] visits:  None in previous 6 months  Medications: Outpatient Encounter Medications as of 01/12/2021  Medication Sig   acetaminophen (TYLENOL) 500 MG tablet Take 500 mg by mouth every 6 (six) hours as needed.   aspirin 81 MG tablet Take 81 mg by mouth daily.   azelastine (ASTELIN) 0.1 % nasal spray Place 2 sprays into the nose 2 (two) times daily as needed for rhinitis or allergies.   b complex vitamins tablet Take 1 tablet by mouth daily.   Colchicine (MITIGARE) 0.6 MG CAPS Take 2 caps once and then one hour later take 1 cap   diltiazem (CARDIZEM CD) 120 MG 24 hr capsule Take 1 capsule (120 mg total) by mouth daily. Please keep upcoming appointment for further refills   Fexofenadine HCl (ALLEGRA ALLERGY PO) Take 1 tablet by mouth daily.    furosemide (LASIX) 20 MG tablet Take 1 tablet (20 mg total) by mouth daily. Please keep upcoming appointment for future refills. Thank you   isosorbide mononitrate (IMDUR) 30 MG 24 hr tablet TAKE 1 TABLET BY MOUTH AT BEDTIME. PLEASE MAKE APPT FOR REFILLS   lisinopril (ZESTRIL) 10 MG tablet TAKE 1 TABLET BY MOUTH DAILY - MAKE YEARLY APPT WITH DR. Johnsie Cancel FOR APRIL 2022 BEFORE ANYMORE REFILL   meclizine (ANTIVERT) 25 MG tablet Take 1 tablet (25 mg total) by mouth 3 (three) times daily as needed for dizziness.   metoprolol succinate (TOPROL-XL) 50 MG 24 hr tablet Take 1 tablet (50 mg total) by mouth daily. TAKE WITH OR IMMEDIATELY FOLLOWING A MEAL.   Multiple Vitamin (MULTIVITAMIN WITH MINERALS) TABS tablet Take 1 tablet by mouth daily.   nitroGLYCERIN (NITROSTAT) 0.4 MG SL tablet PLACE 1 TABLET UNDER TONGUE EVERY 5 MINUTES AS NEEDED FOR CHEST PAIN   potassium  chloride (MICRO-K) 10 MEQ CR capsule TAKE 1 CAPSULE (10 MEQ TOTAL) BY MOUTH DAILY.   rosuvastatin (CRESTOR) 10 MG tablet TAKE 1 TABLET BY MOUTH EVERY DAY   sodium chloride (OCEAN) 0.65 % SOLN nasal spray Place 1 spray into both nostrils as needed for congestion.   tobramycin (TOBREX) 0.3 % ophthalmic solution Place 1 drop into both eyes daily as needed.   warfarin (COUMADIN) 1 MG tablet TAKE 2 TABLETS ON FRIDAYS OR AS DIRECTED BY ANTICOAGULATION CLINIC   warfarin (COUMADIN) 2.5 MG tablet TAKE 1 TABLET DAILY OR TAKE AS DIRECTED BY ANTICOAGULATION CLINIC   No facility-administered encounter medications on file as of 01/12/2021.    Reviewed chart prior to disease state call. Spoke with patient regarding BP  Recent Office Vitals: BP Readings from Last 3 Encounters:  12/28/20 130/70  10/13/20 120/60  10/12/20 118/67   Pulse Readings from Last 3 Encounters:  12/28/20 (!) 55  10/13/20 81  10/12/20 74    Wt Readings from Last 3 Encounters:  12/28/20 143 lb (64.9 kg)  10/13/20 145 lb 3.2 oz (65.9 kg)  09/14/20 145 lb (65.8 kg)     Kidney Function Lab Results  Component Value Date/Time   CREATININE 1.02 10/13/2020 10:58 AM   CREATININE 1.05 (H) 02/17/2020 10:16 AM   CREATININE 1.02 08/15/2019 10:05 AM   GFR 49.23 (L) 10/13/2020 10:58 AM   GFRNONAA 48 (L) 02/17/2020 10:16  AM   GFRAA 55 (L) 02/17/2020 10:16 AM    BMP Latest Ref Rng & Units 10/13/2020 02/17/2020 08/15/2019  Glucose 70 - 99 mg/dL 91 106(H) 104(H)  BUN 6 - 23 mg/dL '17 16 15  '$ Creatinine 0.40 - 1.20 mg/dL 1.02 1.05(H) 1.02  BUN/Creat Ratio 6 - 22 (calc) - 15 -  Sodium 135 - 145 mEq/L 141 143 140  Potassium 3.5 - 5.1 mEq/L 4.3 5.1 4.3  Chloride 96 - 112 mEq/L 105 109 105  CO2 19 - 32 mEq/L '28 25 27  '$ Calcium 8.4 - 10.5 mg/dL 9.7 9.9 10.3    Current antihypertensive regimen:  Lisinopril 09/21/20 10 mg 1 tab daily Metoprolol 50 mg 1 tab daily Isosorbide 30 mg 1 tab daily  How often are you checking your Blood Pressure?   Spoke with patient daughter [name] who stated that her last blood pressure reading was at Dr. Johnsie Cancel office 12/28/20 which was 130/70  Current home BP readings: Patient has no home reading at this time  What recent interventions/DTPs have been made by any provider to improve Blood Pressure control since last CPP Visit: Dr. Johnsie Cancel decreased the patient metoprolol to 50 mg daily, he did not think she needed a high dose  Any recent hospitalizations or ED visits since last visit with CPP? No  What diet changes have been made to improve Blood Pressure Control?  Daughter states that patient has not made any changes to diet  What exercise is being done to improve your Blood Pressure Control?  Daughter states that patient has not gotten out much lately to move around  Adherence Review: Is the patient currently on ACE/ARB medication? Yes Does the patient have >5 day gap between last estimated fill dates? Yes (Daughter states that she has had medications filled recently, but not there at the minute to give recent date) Lisinopril 09/21/20 10 mg 1 tab daily Metoprolol 50 mg 1 tab daily Isosorbide 30 mg 1 tab daily  Star Rating Drugs: Lisinopril 09/21/20 10 mg 09/20/20 90 ds Rosuvastatin 10 mg 09/21/20 90 ds  Ethelene Hal Clinical Pharmacist Assistant [phone]   Time spent:28

## 2021-01-17 ENCOUNTER — Other Ambulatory Visit: Payer: Self-pay | Admitting: Cardiovascular Disease

## 2021-01-18 ENCOUNTER — Other Ambulatory Visit: Payer: Self-pay | Admitting: Cardiovascular Disease

## 2021-01-25 DIAGNOSIS — H43813 Vitreous degeneration, bilateral: Secondary | ICD-10-CM | POA: Diagnosis not present

## 2021-01-25 DIAGNOSIS — H353211 Exudative age-related macular degeneration, right eye, with active choroidal neovascularization: Secondary | ICD-10-CM | POA: Diagnosis not present

## 2021-01-25 DIAGNOSIS — H353223 Exudative age-related macular degeneration, left eye, with inactive scar: Secondary | ICD-10-CM | POA: Diagnosis not present

## 2021-01-25 DIAGNOSIS — H43393 Other vitreous opacities, bilateral: Secondary | ICD-10-CM | POA: Diagnosis not present

## 2021-01-29 ENCOUNTER — Other Ambulatory Visit: Payer: Self-pay | Admitting: Cardiovascular Disease

## 2021-01-31 NOTE — Telephone Encounter (Signed)
Outpatient Medication Detail   Disp Refills Start End   potassium chloride (MICRO-K) 10 MEQ CR capsule 90 capsule 0 01/18/2021    Sig: TAKE 1 CAPSULE BY MOUTH EVERY DAY   Sent to pharmacy as: potassium chloride (MICRO-K) 10 MEQ CR capsule   E-Prescribing Status: Receipt confirmed by pharmacy (01/18/2021  7:17 AM EDT)    Pharmacy  [facility] [place]

## 2021-02-01 ENCOUNTER — Other Ambulatory Visit: Payer: Self-pay

## 2021-02-01 ENCOUNTER — Ambulatory Visit (INDEPENDENT_AMBULATORY_CARE_PROVIDER_SITE_OTHER): Payer: Medicare Other

## 2021-02-01 DIAGNOSIS — I4891 Unspecified atrial fibrillation: Secondary | ICD-10-CM | POA: Diagnosis not present

## 2021-02-01 DIAGNOSIS — Z7901 Long term (current) use of anticoagulants: Secondary | ICD-10-CM

## 2021-02-01 LAB — POCT INR: INR: 2 (ref 2.0–3.0)

## 2021-02-01 NOTE — Patient Instructions (Addendum)
Pre visit review using our clinic review tool, if applicable. No additional management support is needed unless otherwise documented below in the visit note.  Continue to take (2.5 mg) 1 tablet daily.   Re-check in 4 weeks.

## 2021-02-01 NOTE — Progress Notes (Signed)
Agree with management.  Kion Huntsberry J Evadne Ose, MD  

## 2021-02-17 ENCOUNTER — Other Ambulatory Visit: Payer: Self-pay | Admitting: Cardiovascular Disease

## 2021-03-15 ENCOUNTER — Ambulatory Visit: Payer: Medicare Other

## 2021-03-17 ENCOUNTER — Other Ambulatory Visit: Payer: Self-pay

## 2021-03-17 ENCOUNTER — Ambulatory Visit (INDEPENDENT_AMBULATORY_CARE_PROVIDER_SITE_OTHER): Payer: Medicare Other

## 2021-03-17 DIAGNOSIS — Z7901 Long term (current) use of anticoagulants: Secondary | ICD-10-CM

## 2021-03-17 DIAGNOSIS — I4891 Unspecified atrial fibrillation: Secondary | ICD-10-CM

## 2021-03-17 LAB — POCT INR: INR: 1.5 — AB (ref 2.0–3.0)

## 2021-03-17 NOTE — Progress Notes (Signed)
Agree with management.  Delonna Ney J Rinda Rollyson, MD  

## 2021-03-17 NOTE — Patient Instructions (Addendum)
Pre visit review using our clinic review tool, if applicable. No additional management support is needed unless otherwise documented below in the visit note.  Increase dose today to 5mg (2 tablets)  and increase dose tomorrow to 5mg (2 tablets) and then continue to take (2.5 mg) 1 tablet daily.   Re-check in 3 weeks.

## 2021-03-21 ENCOUNTER — Telehealth: Payer: Self-pay

## 2021-03-21 NOTE — Progress Notes (Signed)
Chronic Care Management Pharmacy Assistant   Name: Judy [name]  MRN: [id number] DOB: 04/02/1931    Reason for Encounter: Disease State   Conditions to be addressed/monitored: HTN   Recent office visits:  None ID  Recent consult visits:  None ID  Hospital visits:  None in previous 6 months  Medications: Outpatient Encounter Medications as of 03/21/2021  Medication Sig   acetaminophen (TYLENOL) 500 MG tablet Take 500 mg by mouth every 6 (six) hours as needed.   aspirin 81 MG tablet Take 81 mg by mouth daily.   azelastine (ASTELIN) 0.1 % nasal spray Place 2 sprays into the nose 2 (two) times daily as needed for rhinitis or allergies.   b complex vitamins tablet Take 1 tablet by mouth daily.   Colchicine (MITIGARE) 0.6 MG CAPS Take 2 caps once and then one hour later take 1 cap   diltiazem (CARDIZEM CD) 120 MG 24 hr capsule Take 1 capsule (120 mg total) by mouth daily. Please keep upcoming appointment for further refills   Fexofenadine HCl (ALLEGRA ALLERGY PO) Take 1 tablet by mouth daily.    furosemide (LASIX) 20 MG tablet Take 1 tablet (20 mg total) by mouth daily.   isosorbide mononitrate (IMDUR) 30 MG 24 hr tablet Take 1 tablet (30 mg total) by mouth at bedtime.   lisinopril (ZESTRIL) 10 MG tablet Take 1 tablet (10 mg total) by mouth daily.   meclizine (ANTIVERT) 25 MG tablet Take 1 tablet (25 mg total) by mouth 3 (three) times daily as needed for dizziness.   metoprolol succinate (TOPROL-XL) 50 MG 24 hr tablet Take 1 tablet (50 mg total) by mouth daily. TAKE WITH OR IMMEDIATELY FOLLOWING A MEAL.   Multiple Vitamin (MULTIVITAMIN WITH MINERALS) TABS tablet Take 1 tablet by mouth daily.   nitroGLYCERIN (NITROSTAT) 0.4 MG SL tablet PLACE 1 TABLET UNDER TONGUE EVERY 5 MINUTES AS NEEDED FOR CHEST PAIN   potassium chloride (MICRO-K) 10 MEQ CR capsule TAKE 1 CAPSULE BY MOUTH EVERY DAY   rosuvastatin (CRESTOR) 10 MG tablet TAKE 1 TABLET BY MOUTH EVERY DAY   sodium chloride  (OCEAN) 0.65 % SOLN nasal spray Place 1 spray into both nostrils as needed for congestion.   tobramycin (TOBREX) 0.3 % ophthalmic solution Place 1 drop into both eyes daily as needed.   warfarin (COUMADIN) 1 MG tablet TAKE 2 TABLETS ON FRIDAYS OR AS DIRECTED BY ANTICOAGULATION CLINIC   warfarin (COUMADIN) 2.5 MG tablet TAKE 1 TABLET DAILY OR TAKE AS DIRECTED BY ANTICOAGULATION CLINIC   No facility-administered encounter medications on file as of 03/21/2021.   Reviewed chart prior to disease state call. Spoke with patient regarding BP  Recent Office Vitals: BP Readings from Last 3 Encounters:  12/28/20 130/70  10/13/20 120/60  10/12/20 118/67   Pulse Readings from Last 3 Encounters:  12/28/20 (!) 55  10/13/20 81  10/12/20 74    Wt Readings from Last 3 Encounters:  12/28/20 143 lb (64.9 kg)  10/13/20 145 lb 3.2 oz (65.9 kg)  09/14/20 145 lb (65.8 kg)     Kidney Function Lab Results  Component Value Date/Time   CREATININE 1.02 10/13/2020 10:58 AM   CREATININE 1.05 (H) 02/17/2020 10:16 AM   CREATININE 1.02 08/15/2019 10:05 AM   GFR 49.23 (L) 10/13/2020 10:58 AM   GFRNONAA 48 (L) 02/17/2020 10:16 AM   GFRAA 55 (L) 02/17/2020 10:16 AM    BMP Latest Ref Rng & Units 10/13/2020 02/17/2020 08/15/2019  Glucose 70 - 99  mg/dL 91 106(H) 104(H)  BUN 6 - 23 mg/dL 17 16 15   Creatinine 0.40 - 1.20 mg/dL 1.02 1.05(H) 1.02  BUN/Creat Ratio 6 - 22 (calc) - 15 -  Sodium 135 - 145 mEq/L 141 143 140  Potassium 3.5 - 5.1 mEq/L 4.3 5.1 4.3  Chloride 96 - 112 mEq/L 105 109 105  CO2 19 - 32 mEq/L 28 25 27   Calcium 8.4 - 10.5 mg/dL 9.7 9.9 10.3    Current antihypertensive regimen:  Isosorbide 30 mg Lisinopril 10 mg Metoprolol 50 mg (takes for afib)  How often are you checking your Blood Pressure? infrequently,only takes when daughthers are there  Current home BP readings: NA  What recent interventions/DTPs have been made by any provider to improve Blood Pressure control since last CPP Visit:  none  Any recent hospitalizations or ED visits since last visit with CPP? No  What diet changes have been made to improve Blood Pressure Control?  Patient states she has not made any changes to diet  What exercise is being done to improve your Blood Pressure Control?  Patient states she is very active cleaning house every day  Adherence Review: Is the patient currently on ACE/ARB medication? Yes Does the patient have >5 day gap between last estimated fill dates? No  Care Gaps Colonoscopy-NA Diabetic Foot Exam-02/17/20 Mammogram-NA Ophthalmology-NA Dexa Scan - NA Annual Well Visit - NA Micro albumin-NA Hemoglobin A1c-NA   Star Rating Drugs Lisinopril 10 mg-last fill 01/17/21 90 ds Rosuvastatin 10 mg-last fill 01/17/21 90 ds  Ethelene Hal Clinical Pharmacist Assistant [phone]

## 2021-03-23 ENCOUNTER — Other Ambulatory Visit: Payer: Self-pay | Admitting: Cardiovascular Disease

## 2021-03-23 DIAGNOSIS — H43813 Vitreous degeneration, bilateral: Secondary | ICD-10-CM | POA: Diagnosis not present

## 2021-03-23 DIAGNOSIS — H35372 Puckering of macula, left eye: Secondary | ICD-10-CM | POA: Diagnosis not present

## 2021-03-23 DIAGNOSIS — H353211 Exudative age-related macular degeneration, right eye, with active choroidal neovascularization: Secondary | ICD-10-CM | POA: Diagnosis not present

## 2021-03-23 DIAGNOSIS — H353223 Exudative age-related macular degeneration, left eye, with inactive scar: Secondary | ICD-10-CM | POA: Diagnosis not present

## 2021-03-31 NOTE — Progress Notes (Signed)
Date:  04/13/2021   ID:  Judy [name], DOB 1931/04/27, MRN [id number]  PCP:  Binnie Rail, MD  Cardiologist:   Johnsie Cancel Electrophysiologist:  None   Evaluation Performed:  Follow-Up Visit  Chief Complaint:  CAD/AFib  History of Present Illness:    87 y.o. history of CAD. First stent RCA 1996 and PDA/OM in 2006. Distant cerebellar stroke No carotid disease. Chronic afib on coumadin. Activity limited by macular degeneration and peripheral neuropathy. Has seen primary and neurology on B12 ? From diet controlled DM as well On statin for HLD  Statin Changed to crestor due to interaction of simvastatin with cardizem    No palpitations, bleeding , chest pain Toprol dose decreased in July 2022 due to relative bradycardia   She has no cardiac  complaints    Gout of right first MCP May 2022 Rx with Colchicine Uric acid 7.1 just above normal limit of 7.0   Lost some weight with tonsillitis and COVID latter in February  Upset about primary care lack Of being proactive with Rx of COVID   Discussed looking into expense of DOAC;s again    Past Medical History:  Diagnosis Date   Atrial fibrillation ([place])    a. chronic anticoag, failed prior DCCV;  b. 03/2010 Echo: EF 55-65%, mod MR, mildly to mod dil LA, mod dil RA, mild to mod MR.   CAD (coronary artery disease)    a. BMS to RCA 96';  b. Stent PDA and OM DES 2006   CAROTID ARTERY DISEASE    a. 11/2011 Carotid U/S:  0-39% bilat dzs.   HTN (hypertension)    Hyperlipidemia    LBBB (left bundle branch block)    Macular degeneration, wet ([place]) 10/2012 dx   L>R, follows with retinal specialist->injections monthly.   Obesity    Vertigo    Past Surgical History:  Procedure Laterality Date   [place], 2006 x2   Drug-eluting stent placement to the PDA, drug-eluting stent  placement to the first obtuse  marginal, StarClose closure of the right common femoral arteriotomy site. Successful drug-eluting stent  placement in   both the posterior descending artery and the obtuse marginal. The lesion was directly stented using a 2.5 x 16 mm Taxus deployed at 14 atmospheres.    EYE SURGERY     Tear duct stent     Current Meds  Medication Sig   acetaminophen (TYLENOL) 500 MG tablet Take 500 mg by mouth every 6 (six) hours as needed.   aspirin 81 MG tablet Take 81 mg by mouth daily.   azelastine (ASTELIN) 0.1 % nasal spray Place 2 sprays into the nose 2 (two) times daily as needed for rhinitis or allergies.   b complex vitamins tablet Take 1 tablet by mouth daily.   Colchicine (MITIGARE) 0.6 MG CAPS Take 2 caps once and then one hour later take 1 cap   diltiazem (CARDIZEM CD) 120 MG 24 hr capsule TAKE 1 CAPSULE BY MOUTH DAILY. PLEASE KEEP UPCOMING APPOINTMENT FOR FURTHER REFILLS   Fexofenadine HCl (ALLEGRA ALLERGY PO) Take 1 tablet by mouth daily.    furosemide (LASIX) 20 MG tablet Take 1 tablet (20 mg total) by mouth daily.   isosorbide mononitrate (IMDUR) 30 MG 24 hr tablet Take 1 tablet (30 mg total) by mouth at bedtime.   lisinopril (ZESTRIL) 10 MG tablet Take 1 tablet (10 mg total) by mouth daily.   meclizine (ANTIVERT) 25 MG tablet Take 1 tablet (  25 mg total) by mouth 3 (three) times daily as needed for dizziness.   metoprolol succinate (TOPROL-XL) 50 MG 24 hr tablet Take 1 tablet (50 mg total) by mouth daily. TAKE WITH OR IMMEDIATELY FOLLOWING A MEAL.   Multiple Vitamin (MULTIVITAMIN WITH MINERALS) TABS tablet Take 1 tablet by mouth daily.   nitroGLYCERIN (NITROSTAT) 0.4 MG SL tablet PLACE 1 TABLET UNDER TONGUE EVERY 5 MINUTES AS NEEDED FOR CHEST PAIN   potassium chloride (MICRO-K) 10 MEQ CR capsule TAKE 1 CAPSULE BY MOUTH EVERY DAY   rosuvastatin (CRESTOR) 10 MG tablet TAKE 1 TABLET BY MOUTH EVERY DAY   sodium chloride (OCEAN) 0.65 % SOLN nasal spray Place 1 spray into both nostrils as needed for congestion.   tobramycin (TOBREX) 0.3 % ophthalmic solution Place 1 drop into both eyes daily as needed.   warfarin  (COUMADIN) 1 MG tablet TAKE 2 TABLETS ON FRIDAYS OR AS DIRECTED BY ANTICOAGULATION CLINIC   warfarin (COUMADIN) 2.5 MG tablet TAKE 1 TABLET DAILY OR TAKE AS DIRECTED BY ANTICOAGULATION CLINIC     Allergies:   Betadine [povidone iodine], Betadine [povidone-iodine], and Methocarbamol   Social History   Tobacco Use   Smoking status: Never   Smokeless tobacco: Never  Vaping Use   Vaping Use: Never used  Substance Use Topics   Alcohol use: No   Drug use: No     Family Hx: The patient's family history includes Arthritis in her father and mother; CAD in her brother, brother, and sister.  ROS:   Please see the history of present illness.     All other systems reviewed and are negative.   Prior CV studies:   The following studies were reviewed today:  Carotid 12/18/18 plaque no stenosis   Labs/Other Tests and Data Reviewed:    EKG:  9//2/20  afib old IMI nonspecific ST chagnes 04/13/2021 afib rate 56 old IMI   Recent Labs: 10/13/2020: ALT 10; BUN 17; Creatinine, Ser 1.02; Potassium 4.3; Sodium 141   Recent Lipid Panel Lab Results  Component Value Date/Time   CHOL 178 02/17/2020 10:16 AM   CHOL 178 05/12/2019 09:25 AM   TRIG 199 (H) 02/17/2020 10:16 AM   HDL 50 02/17/2020 10:16 AM   HDL 47 05/12/2019 09:25 AM   CHOLHDL 3.6 02/17/2020 10:16 AM   LDLCALC 98 02/17/2020 10:16 AM   LDLDIRECT 97.0 08/15/2019 10:05 AM    Wt Readings from Last 3 Encounters:  04/13/21 134 lb 6.4 oz (61 kg)  12/28/20 143 lb (64.9 kg)  10/13/20 145 lb 3.2 oz (65.9 kg)     Objective:    Vital Signs:  BP 132/84   Pulse 84   Ht 5\' 3"  (1.6 m)   Wt 134 lb 6.4 oz (61 kg)   SpO2 97%   BMI 23.81 kg/m    Affect appropriate Healthy:  appears stated age HEENT: normal Neck supple with no adenopathy JVP normal no bruits no thyromegaly Lungs clear with no wheezing and good diaphragmatic motion Heart:  S1/S2 no murmur, no rub, gallop or click PMI normal Abdomen: benighn, BS positve, no tenderness,  no AAA no bruit.  No HSM or HJR Distal pulses intact with no bruits No edema Neuro non-focal Skin warm and dry No muscular weakness   ASSESSMENT & PLAN:    CAD:  Distant intervention to RCA and OM last in 2006 no angina continue medical RX given age  HLD:  Continue statin LDL 97  08/15/19   DM:  Diet controlled  A1c 6.12 August 2018  Afib:  Toporl decreased to 50 mg 12/28/20 INR been on low side most recently 1.5 on 03/17/21 f/u coumadin clinic   Neuropathy:  On B12 ? Related to DM f/u neurology   Macular Degeneration: f/u Dr Tye Savoy has had injections and improved  COVID-19 Education: The signs and symptoms of COVID-19 were discussed with the patient and how to seek care for testing (follow up with PCP or arrange E-visit).  The importance of social distancing was discussed today.   Medication Adjustments/Labs and Tests Ordered: Current medicines are reviewed at length with the patient today.  Concerns regarding medicines are outlined above.   Tests Ordered:  None   Medication Changes:  None   Disposition:  Follow up in a year   Signed, Jenkins Rouge, MD  04/13/2021 10:33 AM    [place]

## 2021-04-05 ENCOUNTER — Other Ambulatory Visit: Payer: Self-pay

## 2021-04-05 ENCOUNTER — Ambulatory Visit (INDEPENDENT_AMBULATORY_CARE_PROVIDER_SITE_OTHER): Payer: Medicare Other

## 2021-04-05 DIAGNOSIS — Z23 Encounter for immunization: Secondary | ICD-10-CM

## 2021-04-05 DIAGNOSIS — I4891 Unspecified atrial fibrillation: Secondary | ICD-10-CM

## 2021-04-05 DIAGNOSIS — Z7901 Long term (current) use of anticoagulants: Secondary | ICD-10-CM

## 2021-04-05 LAB — POCT INR: INR: 1.6 — AB (ref 2.0–3.0)

## 2021-04-05 NOTE — Patient Instructions (Addendum)
Pre visit review using our clinic review tool, if applicable. No additional management support is needed unless otherwise documented below in the visit note.  Increase dose today to 5mg (2 tablets)  and increase dose tomorrow to 3.75mg (2 1/2 tablets) and then continue to take (2.5 mg) 1 tablet daily except take 3.5 mg (1 tablet 2.5mg  and 1 tablet 3 mg)on Mondays and Thursdays.   Re-check in 3 weeks.

## 2021-04-07 NOTE — Progress Notes (Signed)
Agree with management.  Rosamary Boudreau J Kenyona Rena, MD  

## 2021-04-13 ENCOUNTER — Ambulatory Visit (INDEPENDENT_AMBULATORY_CARE_PROVIDER_SITE_OTHER): Payer: Medicare Other | Admitting: Cardiovascular Disease

## 2021-04-13 ENCOUNTER — Encounter: Payer: Self-pay | Admitting: Cardiovascular Disease

## 2021-04-13 ENCOUNTER — Other Ambulatory Visit: Payer: Self-pay

## 2021-04-13 VITALS — BP 132/84 | HR 84 | Ht 63.0 in | Wt 134.4 lb

## 2021-04-13 DIAGNOSIS — E782 Mixed hyperlipidemia: Secondary | ICD-10-CM

## 2021-04-13 DIAGNOSIS — I2510 Atherosclerotic heart disease of native coronary artery without angina pectoris: Secondary | ICD-10-CM | POA: Diagnosis not present

## 2021-04-13 DIAGNOSIS — I482 Chronic atrial fibrillation, unspecified: Secondary | ICD-10-CM | POA: Diagnosis not present

## 2021-04-13 NOTE — Patient Instructions (Signed)
Medication Instructions:  *If you need a refill on your cardiac medications before your next appointment, please call your pharmacy*  Lab Work: If you have labs (blood work) drawn today and your tests are completely normal, you will receive your results only by: MyChart Message (if you have MyChart) OR A paper copy in the mail If you have any lab test that is abnormal or we need to change your treatment, we will call you to review the results.  Follow-Up: At [facility], you and your health needs are our priority.  As part of our continuing mission to provide you with exceptional heart care, we have created designated Provider Care Teams.  These Care Teams include your primary Cardiologist (physician) and Advanced Practice Providers (APPs -  Physician Assistants and Nurse Practitioners) who all work together to provide you with the care you need, when you need it.  We recommend signing up for the patient portal called "MyChart".  Sign up information is provided on this After Visit Summary.  MyChart is used to connect with patients for Virtual Visits (Telemedicine).  Patients are able to view lab/test results, encounter notes, upcoming appointments, etc.  Non-urgent messages can be sent to your provider as well.   To learn more about what you can do with MyChart, go to [email or web address].    Your next appointment:   1 year(s)  The format for your next appointment:   In Person  Provider:   You may see Peter Nishan, MD or one of the following Advanced Practice Providers on your designated Care Team:   Laura Ingold, NP  

## 2021-04-19 ENCOUNTER — Telehealth: Payer: Self-pay | Admitting: Internal Medicine

## 2021-04-19 NOTE — Telephone Encounter (Signed)
Left message for patient to call me back at [phone] to schedule Medicare Annual Wellness Visit   Last AWV  02/17/20  Please schedule at anytime with [facility] if patient calls the office back.    40 Minutes appointment   Any questions, please call me at [phone]

## 2021-04-24 ENCOUNTER — Other Ambulatory Visit: Payer: Self-pay | Admitting: Internal Medicine

## 2021-04-24 DIAGNOSIS — Z7901 Long term (current) use of anticoagulants: Secondary | ICD-10-CM

## 2021-04-25 NOTE — Telephone Encounter (Signed)
Pt compliant with coumadin management. Sent in refill. 

## 2021-04-26 ENCOUNTER — Other Ambulatory Visit: Payer: Self-pay

## 2021-04-26 ENCOUNTER — Ambulatory Visit (INDEPENDENT_AMBULATORY_CARE_PROVIDER_SITE_OTHER): Payer: Medicare Other

## 2021-04-26 DIAGNOSIS — Z7901 Long term (current) use of anticoagulants: Secondary | ICD-10-CM

## 2021-04-26 DIAGNOSIS — I4891 Unspecified atrial fibrillation: Secondary | ICD-10-CM

## 2021-04-26 LAB — POCT INR: INR: 1.7 — AB (ref 2.0–3.0)

## 2021-04-26 MED ORDER — WARFARIN SODIUM 1 MG PO TABS: ORAL_TABLET | ORAL | 1 refills | Status: DC

## 2021-04-26 NOTE — Progress Notes (Signed)
Sent in refill for coumadin, 1mg , per pt request.

## 2021-04-26 NOTE — Progress Notes (Signed)
Increase dose today to 5mg  (2 tablets 2.5mg ) and increase dose tomorrow to 3.75mg  (1 tablet of 2.5mg  and 1 tablet of 1mg ) and then change weekly dose to take 3.75mg  (1 tablet of 2.5mg  and 1 tablet of 1mg ) except take 2.5mg  (1 tablet of 2.5mg ) on Sun, Tues, and Thurs. Recheck in 3 wks.

## 2021-04-26 NOTE — Patient Instructions (Addendum)
Pre visit review using our clinic review tool, if applicable. No additional management support is needed unless otherwise documented below in the visit note.  Increase dose today to 5mg  (2 tablets 2.5mg ) and increase dose tomorrow to 3.75mg  (1 tablet of 2.5mg  and 1 tablet of 1mg ) and then change weekly dose to take 3.75mg  (1 tablet of 2.5mg  and 1 tablet of 1mg ) except take 2.5mg  (1 tablet of 2.5mg ) on Sun, Tues, and Thurs. Recheck in 3 wks.

## 2021-05-02 ENCOUNTER — Telehealth: Payer: Self-pay

## 2021-05-02 ENCOUNTER — Ambulatory Visit (INDEPENDENT_AMBULATORY_CARE_PROVIDER_SITE_OTHER): Payer: Medicare Other

## 2021-05-02 DIAGNOSIS — Z0000 Encounter for general adult medical examination without abnormal findings: Secondary | ICD-10-CM

## 2021-05-02 NOTE — Progress Notes (Signed)
Subjective:   Judy [name] is a 88 y.o. female who presents for Medicare Annual (Subsequent) preventive examination.  I connected with Judy [name]  today by telephone and verified that I am speaking with the correct person using two identifiers. Location patient: home Location provider: work Persons participating in the virtual visit: patient, provider.   I discussed the limitations, risks, security and privacy concerns of performing an evaluation and management service by telephone and the availability of in person appointments. I also discussed with the patient that there may be a patient responsible charge related to this service. The patient expressed understanding and verbally consented to this telephonic visit.    Interactive audio and video telecommunications were attempted between this provider and patient, however failed, due to patient having technical difficulties OR patient did not have access to video capability.  We continued and completed visit with audio only.    Review of Systems     Cardiac Risk Factors include: advanced age (>45men, >50 women);diabetes mellitus;dyslipidemia;hypertension     Objective:    Today's Vitals   There is no height or weight on file to calculate BMI.  Advanced Directives 05/02/2021 02/17/2020 05/15/2017 11/27/2016  Does Patient Have a Medical Advance Directive? Yes Yes No Yes  Type of Paramedic of [place];Living will - - Living will  Does patient want to make changes to medical advance directive? - No - Patient declined - No - Patient declined  Copy of Judy [name] in Chart? No - copy requested - - -  Would patient [name] information on creating a medical advance directive? - - Yes (ED - Information included in AVS) -    Current Medications (verified) Outpatient Encounter Medications as of 05/02/2021  Medication Sig   acetaminophen (TYLENOL) 500 MG tablet Take 500 mg by mouth every 6 (six)  hours as needed.   aspirin 81 MG tablet Take 81 mg by mouth daily.   azelastine (ASTELIN) 0.1 % nasal spray Place 2 sprays into the nose 2 (two) times daily as needed for rhinitis or allergies.   b complex vitamins tablet Take 1 tablet by mouth daily.   Colchicine (MITIGARE) 0.6 MG CAPS Take 2 caps once and then one hour later take 1 cap   diltiazem (CARDIZEM CD) 120 MG 24 hr capsule TAKE 1 CAPSULE BY MOUTH DAILY. PLEASE KEEP UPCOMING APPOINTMENT FOR FURTHER REFILLS   Fexofenadine HCl (ALLEGRA ALLERGY PO) Take 1 tablet by mouth daily.    furosemide (LASIX) 20 MG tablet Take 1 tablet (20 mg total) by mouth daily.   isosorbide mononitrate (IMDUR) 30 MG 24 hr tablet Take 1 tablet (30 mg total) by mouth at bedtime.   lisinopril (ZESTRIL) 10 MG tablet Take 1 tablet (10 mg total) by mouth daily.   meclizine (ANTIVERT) 25 MG tablet Take 1 tablet (25 mg total) by mouth 3 (three) times daily as needed for dizziness.   metoprolol succinate (TOPROL-XL) 50 MG 24 hr tablet Take 1 tablet (50 mg total) by mouth daily. TAKE WITH OR IMMEDIATELY FOLLOWING A MEAL.   Multiple Vitamin (MULTIVITAMIN WITH MINERALS) TABS tablet Take 1 tablet by mouth daily.   nitroGLYCERIN (NITROSTAT) 0.4 MG SL tablet PLACE 1 TABLET UNDER TONGUE EVERY 5 MINUTES AS NEEDED FOR CHEST PAIN   potassium chloride (MICRO-K) 10 MEQ CR capsule TAKE 1 CAPSULE BY MOUTH EVERY DAY   rosuvastatin (CRESTOR) 10 MG tablet TAKE 1 TABLET BY MOUTH EVERY DAY   sodium chloride (OCEAN) 0.65 %  SOLN nasal spray Place 1 spray into both nostrils as needed for congestion.   tobramycin (TOBREX) 0.3 % ophthalmic solution Place 1 drop into both eyes daily as needed.   warfarin (COUMADIN) 1 MG tablet TAKE 1 TABLET BY MOUTH MONDAYS, WEDNESDAYS, FRIDAYS AND SATURDAYS OR AS DIRECTED BY ANTICOAGULATION CLINIC   warfarin (COUMADIN) 2.5 MG tablet TAKE 1 TABLET DAILY OR TAKE AS DIRECTED BY ANTICOAGULATION CLINIC   No facility-administered encounter medications on file as of  05/02/2021.    Allergies (verified) Betadine [povidone iodine], Betadine [povidone-iodine], and Methocarbamol   History: Past Medical History:  Diagnosis Date   Atrial fibrillation ([place])    a. chronic anticoag, failed prior DCCV;  b. 03/2010 Echo: EF 55-65%, mod MR, mildly to mod dil LA, mod dil RA, mild to mod MR.   CAD (coronary artery disease)    a. BMS to RCA 96';  b. Stent PDA and OM DES 2006   CAROTID ARTERY DISEASE    a. 11/2011 Carotid U/S:  0-39% bilat dzs.   HTN (hypertension)    Hyperlipidemia    LBBB (left bundle branch block)    Macular degeneration, wet ([place]) 10/2012 dx   L>R, follows with retinal specialist->injections monthly.   Obesity    Vertigo    Past Surgical History:  Procedure Laterality Date   [place], 2006 x2   Drug-eluting stent placement to the PDA, drug-eluting stent  placement to the first obtuse  marginal, StarClose closure of the right common femoral arteriotomy site. Successful drug-eluting stent  placement in  both the posterior descending artery and the obtuse marginal. The lesion was directly stented using a 2.5 x 16 mm Taxus deployed at 14 atmospheres.    EYE SURGERY     Tear duct stent   Family History  Problem Relation Age of Onset   Arthritis Mother        died in her 57's   Arthritis Father        died in MVA.   CAD Brother        deceased   CAD Brother        deceased   CAD Sister        deceased   Social History   Socioeconomic History   Marital status: Widowed    Spouse name: Not on file   Number of children: Not on file   Years of education: Not on file   Highest education level: Not on file  Occupational History   Not on file  Tobacco Use   Smoking status: Never   Smokeless tobacco: Never  Vaping Use   Vaping Use: Never used  Substance and Sexual Activity   Alcohol use: No   Drug use: No   Sexual activity: Not on file  Other Topics Concern   Not on file  Social History Narrative   Lives  in [place] by herself.  She is active around the house w/o symptoms or limitations.   Social Determinants of Health   Financial Resource Strain: Low Risk    Difficulty of Paying Living Expenses: Not hard at all  Food Insecurity: No Food Insecurity   Worried About Charity fundraiser in the Last Year: Never true   [place] in the Last Year: Never true  Transportation Needs: No Transportation Needs   Lack of Transportation (Medical): No   Lack of Transportation (Non-Medical): No  Physical Activity: Insufficiently Active   Days of Exercise per Week: 2  days   Minutes of Exercise per Session: 30 min  Stress: No Stress Concern Present   Feeling of Stress : Not at all  Social Connections: Moderately Isolated   Frequency of Communication with Friends and Family: Twice a week   Frequency of Social Gatherings with Friends and Family: Twice a week   Attends Religious Services: 1 to 4 times per year   Active Member of [employer] or Organizations: No   Attends Archivist Meetings: Never   Marital Status: Widowed    Tobacco Counseling Counseling given: Not Answered   Clinical Intake:  Pre-visit preparation completed: Yes  Pain : No/denies pain     Nutritional Risks: None Diabetes: Yes CBG done?: No Did pt. bring in CBG monitor from home?: No  How often do you need to have someone help you when you read instructions, pamphlets, or other written materials from your doctor or pharmacy?: 1 - Never What is the last grade level you completed in school?: High School  Diabetic?yes Nutrition Risk Assessment:  Has the patient had any N/V/D within the last 2 months?  No  Does the patient have any non-healing wounds?  No  Has the patient had any unintentional weight loss or weight gain?  Yes   Diabetes:  Is the patient diabetic?  Yes  If diabetic, was a CBG obtained today?  No  Did the patient bring in their glucometer from home?  No  How often do you monitor your CBG's?  never.   Financial Strains and Diabetes Management:  Are you having any financial strains with the device, your supplies or your medication? No .  Does the patient want to be seen by Chronic Care Management for management of their diabetes?  No  Would the patient [name] to be referred to a Nutritionist or for Diabetic Management?  No   Diabetic Exams:  Diabetic Eye Exam: Completed 02/2021 Diabetic Foot Exam: Overdue, Pt has been advised about the importance in completing this exam. Pt is scheduled for diabetic foot exam on next office visit .   Interpreter Needed?: No  Information entered by :: [place] of Daily Living In your present state of health, do you have any difficulty performing the following activities: 05/02/2021  Hearing? N  Vision? N  Difficulty concentrating or making decisions? N  Walking or climbing stairs? N  Dressing or bathing? N  Doing errands, shopping? N  Preparing Food and eating ? N  Using the Toilet? N  In the past six months, have you accidently leaked urine? N  Do you have problems with loss of bowel control? N  Managing your Medications? N  Managing your Finances? N  Housekeeping or managing your Housekeeping? N  Some recent data might be hidden    Patient Care Team: Binnie Rail, MD as PCP - General (Internal Medicine) Josue Hector, MD as PCP - Cardiology (Cardiology) Josue Hector, MD (Cardiology) Netta Cedars, MD (Orthopedic Surgery) Sherlynn Stalls, MD (Ophthalmology) Charlton Haws, [facility] as Pharmacist (Pharmacist)  Indicate any recent Medical Services you may have received from other than [facility] providers in the past year (date may be approximate).     Assessment:   This is a routine wellness examination for Caffie.  Hearing/Vision screen Vision Screening - Comments:: Annual eye exams wear glasses  Dietary issues and exercise activities discussed: Current Exercise Habits: Home exercise routine, Type of  exercise: walking, Time (Minutes): 30, Frequency (Times/Week): 2, Weekly Exercise (Minutes/Week): 60, Intensity:  Mild, Exercise limited by: None identified   Goals Addressed               This Visit's Progress     Patient Stated (pt-stated)   On track     My goal is to continue to be active around the house and increase my water intake.       Depression Screen PHQ 2/9 Scores 05/02/2021 05/02/2021 07/27/2020 02/17/2020 08/15/2019 05/15/2017 02/07/2017  PHQ - 2 Score 0 0 0 0 0 0 0  PHQ- 9 Score - - - - - 0 -    Fall Risk Fall Risk  05/02/2021 07/27/2020 02/17/2020 08/15/2019 05/02/2018  Falls in the past year? 0 1 1 0 0  Comment - - - - Emmi Telephone Survey: data to providers prior to load  Number falls in past yr: 0 1 0 0 -  Comment - - - - -  Injury with Fall? 0 1 0 0 -  Risk for fall due to : - - Impaired balance/gait - -  Risk for fall due to: Comment - - due to vertigo - -  Follow up Falls evaluation completed Falls evaluation completed Falls evaluation completed - -    FALL RISK PREVENTION PERTAINING TO THE HOME:  Any stairs in or around the home? No  If so, are there any without handrails? No  Home free of loose throw rugs in walkways, pet beds, electrical cords, etc? Yes  Adequate lighting in your home to reduce risk of falls? Yes   ASSISTIVE DEVICES UTILIZED TO PREVENT FALLS:  Life alert? No  Use of a cane, walker or w/c? No  Grab bars in the bathroom? Yes  Shower chair or bench in shower? Yes  Elevated toilet seat or a handicapped toilet? No    Cognitive Function: Normal cognitive status assessed by direct observation by this Nurse Health Advisor. No abnormalities found.   MMSE - Mini Mental State Exam 05/15/2017  Orientation to time 5  Orientation to Place 5  Registration 3  Attention/ Calculation 5  Recall 2  Language- name 2 objects 2  Language- repeat 1  Language- follow 3 step command 3  Language- read & follow direction 1  Write a sentence 1  Copy  design 1  Total score 29     6CIT Screen 02/17/2020  What Year? 0 points  What month? 0 points  What time? 0 points  Count back from 20 0 points  Months in reverse 0 points  Repeat phrase 2 points  Total Score 2    Immunizations Immunization History  Administered Date(s) Administered   Fluad Quad(high Dose 65+) 04/13/2020, 04/05/2021   Influenza Split 03/22/2012   Influenza Whole 03/21/2011   Influenza, High Dose Seasonal PF 04/06/2015, 02/18/2016, 03/02/2017, 03/08/2018   Influenza,inj,quad, With Preservative 02/26/2019   Influenza-Unspecified 03/12/2013   PFIZER(Purple Top)SARS-COV-2 Vaccination 07/02/2019, 07/23/2019   Pneumococcal Conjugate-13 09/21/2014   Pneumococcal Polysaccharide-23 05/28/2013   Zoster, Live 06/12/2009    TDAP status: Due, Education has been provided regarding the importance of this vaccine. Advised may receive this vaccine at local pharmacy or Health Dept. Aware to provide a copy of the vaccination record if obtained from local pharmacy or Health Dept. Verbalized acceptance and understanding.  Flu Vaccine status: Up to date  Pneumococcal vaccine status: Up to date  Covid-19 vaccine status: Completed vaccines  Qualifies for Shingles Vaccine? Yes   Zostavax completed No   Shingrix Completed?: No.    Education has been provided regarding  the importance of this vaccine. Patient has been advised to call insurance company to determine out of pocket expense if they have not yet received this vaccine. Advised may also receive vaccine at local pharmacy or Health Dept. Verbalized acceptance and understanding.  Screening Tests Health Maintenance  Topic Date Due   OPHTHALMOLOGY EXAM  Never done   TETANUS/TDAP  Never done   Zoster Vaccines- Shingrix (1 of 2) Never done   COVID-19 Vaccine (3 - Pfizer risk series) 08/20/2019   HEMOGLOBIN A1C  08/16/2020   FOOT EXAM  02/16/2021   Pneumonia Vaccine 59+ Years old  Completed   INFLUENZA VACCINE  Completed    DEXA SCAN  Completed   HPV VACCINES  Aged Out    Health Maintenance  Health Maintenance Due  Topic Date Due   OPHTHALMOLOGY EXAM  Never done   TETANUS/TDAP  Never done   Zoster Vaccines- Shingrix (1 of 2) Never done   COVID-19 Vaccine (3 - Pfizer risk series) 08/20/2019   HEMOGLOBIN A1C  08/16/2020   FOOT EXAM  02/16/2021    Colorectal cancer screening: No longer required.   Mammogram status: No longer required due to age.  Bone Density status: Completed 09/21/2014. Results reflect: Bone density results: OSTEOPENIA. Repeat every 5 years.  Lung Cancer Screening: (Low Dose CT Chest recommended if Age 70-80 years, 30 pack-year currently smoking OR have quit w/in 15years.) does not qualify.   Lung Cancer Screening Referral: n/a  Additional Screening:  Hepatitis C Screening: does not qualify;   Vision Screening: Recommended annual ophthalmology exams for early detection of glaucoma and other disorders of the eye. Is the patient up to date with their annual eye exam?  Yes  Who is the provider or what is the name of the office in which the patient attends annual eye exams? Dr.Digby  If pt is not established with a provider, would they [name] to be referred to a provider to establish care? No .   Dental Screening: Recommended annual dental exams for proper oral hygiene  Community Resource Referral / Chronic Care Management: CRR required this visit?  No   CCM required this visit?  No      Plan:     I have personally reviewed and noted the following in the patient's chart:   Medical and social history Use of alcohol, tobacco or illicit drugs  Current medications and supplements including opioid prescriptions.  Functional ability and status Nutritional status Physical activity Advanced directives List of other physicians Hospitalizations, surgeries, and ER visits in previous 12 months Vitals Screenings to include cognitive, depression, and falls Referrals and  appointments  In addition, I have reviewed and discussed with patient certain preventive protocols, quality metrics, and best practice recommendations. A written personalized care plan for preventive services as well as general preventive health recommendations were provided to patient.     Randel Pigg, LPN   27/11/2374   Nurse Notes: none

## 2021-05-02 NOTE — Telephone Encounter (Signed)
Judy [name] reporting apt change to 12/8 at 1030 will work for pt.  Apt has been RS.

## 2021-05-02 NOTE — Telephone Encounter (Signed)
Pt's daughter, [name], LVM reporting pt's apt on 12/6 will need to be moved due to conflict with another apt. Prefers 12/8 if possible.  LVM for Kim reporting 12/8 at [employer] will work. Choices for apts are 8, 10, 1030, 2, 230, or 3pm.

## 2021-05-02 NOTE — Patient Instructions (Signed)
Judy [name] , Thank you for taking time to come for your Medicare Wellness Visit. I appreciate your ongoing commitment to your health goals. Please review the following plan we discussed and let me know if I can assist you in the future.   Screening recommendations/referrals: Colonoscopy: no longer required  Mammogram: no longer required  Bone Density: declined  Recommended yearly ophthalmology/optometry visit for glaucoma screening and checkup Recommended yearly dental visit for hygiene and checkup  Vaccinations: Influenza vaccine: completed  Pneumococcal vaccine: completed  Tdap vaccine: due  Shingles vaccine: will consider     Advanced directives: will consider   Conditions/risks identified: none   Next appointment: none    Preventive Care 44 Years and Older, Female Preventive care refers to lifestyle choices and visits with your health care provider that can promote health and wellness. What does preventive care include? A yearly physical exam. This is also called an annual well check. Dental exams once or twice a year. Routine eye exams. Ask your health care provider how often you should have your eyes checked. Personal lifestyle choices, including: Daily care of your teeth and gums. Regular physical activity. Eating a healthy diet. Avoiding tobacco and drug use. Limiting alcohol use. Practicing safe sex. Taking low-dose aspirin every day. Taking vitamin and mineral supplements as recommended by your health care provider. What happens during an annual well check? The services and screenings done by your health care provider during your annual well check will depend on your age, overall health, lifestyle risk factors, and family history of disease. Counseling  Your health care provider may ask you questions about your: Alcohol use. Tobacco use. Drug use. Emotional well-being. Home and relationship well-being. Sexual activity. Eating habits. History of  falls. Memory and ability to understand (cognition). Work and work Statistician. Reproductive health. Screening  You may have the following tests or measurements: Height, weight, and BMI. Blood pressure. Lipid and cholesterol levels. These may be checked every 5 years, or more frequently if you are over 58 years old. Skin check. Lung cancer screening. You may have this screening every year starting at age 80 if you have a 30-pack-year history of smoking and currently smoke or have quit within the past 15 years. Fecal occult blood test (FOBT) of the stool. You may have this test every year starting at age 37. Flexible sigmoidoscopy or colonoscopy. You may have a sigmoidoscopy every 5 years or a colonoscopy every 10 years starting at age 64. Hepatitis C blood test. Hepatitis B blood test. Sexually transmitted disease (STD) testing. Diabetes screening. This is done by checking your blood sugar (glucose) after you have not eaten for a while (fasting). You may have this done every 1-3 years. Bone density scan. This is done to screen for osteoporosis. You may have this done starting at age 61. Mammogram. This may be done every 1-2 years. Talk to your health care provider about how often you should have regular mammograms. Talk with your health care provider about your test results, treatment options, and if necessary, the need for more tests. Vaccines  Your health care provider may recommend certain vaccines, such as: Influenza vaccine. This is recommended every year. Tetanus, diphtheria, and acellular pertussis (Tdap, Td) vaccine. You may need a Td booster every 10 years. Zoster vaccine. You may need this after age 27. Pneumococcal 13-valent conjugate (PCV13) vaccine. One dose is recommended after age 76. Pneumococcal polysaccharide (PPSV23) vaccine. One dose is recommended after age 40. Talk to your health care provider about  which screenings and vaccines you need and how often you need  them. This information is not intended to replace advice given to you by your health care provider. Make sure you discuss any questions you have with your health care provider. Document Released: 06/25/2015 Document Revised: 02/16/2016 Document Reviewed: 03/30/2015 Elsevier Interactive Patient Education  2017 [employer] Prevention in the Home Falls can cause injuries. They can happen to people of all ages. There are many things you can do to make your home safe and to help prevent falls. What can I do on the outside of my home? Regularly fix the edges of walkways and driveways and fix any cracks. Remove anything that might make you trip as you walk through a door, such as a raised step or threshold. Trim any bushes or trees on the path to your home. Use bright outdoor lighting. Clear any walking paths of anything that might make someone trip, such as rocks or tools. Regularly check to see if handrails are loose or broken. Make sure that both sides of any steps have handrails. Any raised decks and porches should have guardrails on the edges. Have any leaves, snow, or ice cleared regularly. Use sand or salt on walking paths during winter. Clean up any spills in your garage right away. This includes oil or grease spills. What can I do in the bathroom? Use night lights. Install grab bars by the toilet and in the tub and shower. Do not use towel bars as grab bars. Use non-skid mats or decals in the tub or shower. If you need to sit down in the shower, use a plastic, non-slip stool. Keep the floor dry. Clean up any water that spills on the floor as soon as it happens. Remove soap buildup in the tub or shower regularly. Attach bath mats securely with double-sided non-slip rug tape. Do not have throw rugs and other things on the floor that can make you trip. What can I do in the bedroom? Use night lights. Make sure that you have a light by your bed that is easy to reach. Do not use  any sheets or blankets that are too big for your bed. They should not hang down onto the floor. Have a firm chair that has side arms. You can use this for support while you get dressed. Do not have throw rugs and other things on the floor that can make you trip. What can I do in the kitchen? Clean up any spills right away. Avoid walking on wet floors. Keep items that you use a lot in easy-to-reach places. If you need to reach something above you, use a strong step stool that has a grab bar. Keep electrical cords out of the way. Do not use floor polish or wax that makes floors slippery. If you must use wax, use non-skid floor wax. Do not have throw rugs and other things on the floor that can make you trip. What can I do with my stairs? Do not leave any items on the stairs. Make sure that there are handrails on both sides of the stairs and use them. Fix handrails that are broken or loose. Make sure that handrails are as long as the stairways. Check any carpeting to make sure that it is firmly attached to the stairs. Fix any carpet that is loose or worn. Avoid having throw rugs at the top or bottom of the stairs. If you do have throw rugs, attach them to the floor with  carpet tape. Make sure that you have a light switch at the top of the stairs and the bottom of the stairs. If you do not have them, ask someone to add them for you. What else can I do to help prevent falls? Wear shoes that: Do not have high heels. Have rubber bottoms. Are comfortable and fit you well. Are closed at the toe. Do not wear sandals. If you use a stepladder: Make sure that it is fully opened. Do not climb a closed stepladder. Make sure that both sides of the stepladder are locked into place. Ask someone to hold it for you, if possible. Clearly mark and make sure that you can see: Any grab bars or handrails. First and last steps. Where the edge of each step is. Use tools that help you move around (mobility aids)  if they are needed. These include: Canes. Walkers. Scooters. Crutches. Turn on the lights when you go into a dark area. Replace any light bulbs as soon as they burn out. Set up your furniture so you have a clear path. Avoid moving your furniture around. If any of your floors are uneven, fix them. If there are any pets around you, be aware of where they are. Review your medicines with your doctor. Some medicines can make you feel dizzy. This can increase your chance of falling. Ask your doctor what other things that you can do to help prevent falls. This information is not intended to replace advice given to you by your health care provider. Make sure you discuss any questions you have with your health care provider. Document Released: 03/25/2009 Document Revised: 11/04/2015 Document Reviewed: 07/03/2014 Elsevier Interactive Patient Education  2017 [employer].

## 2021-05-02 NOTE — Telephone Encounter (Signed)
A user error has taken place: encounter opened in error, closed for administrative reasons.

## 2021-05-09 ENCOUNTER — Telehealth: Payer: Self-pay

## 2021-05-09 NOTE — Telephone Encounter (Signed)
Pt's daughter, [name], called reporting pt had epistaxis once 2 days ago. They then decided to hold her dose on that day, which was to be 3.5 mg increase of dosing at last apt due to INR of 1.7 and pt has been mostly under therapeutic INR for the last 6 months. [name] reports she is not aware of any other bleeding but will ask the pt again.  Kim requested to change pt's apt from 12/8 to tomorrow. RS pt for tomorrow and advised if any further bleeding to contact the office. Kim verbalized understanding.

## 2021-05-09 NOTE — Telephone Encounter (Signed)
noted 

## 2021-05-10 ENCOUNTER — Ambulatory Visit (INDEPENDENT_AMBULATORY_CARE_PROVIDER_SITE_OTHER): Payer: Medicare Other

## 2021-05-10 ENCOUNTER — Other Ambulatory Visit: Payer: Self-pay

## 2021-05-10 DIAGNOSIS — Z7901 Long term (current) use of anticoagulants: Secondary | ICD-10-CM

## 2021-05-10 LAB — POCT INR: INR: 2.1 (ref 2.0–3.0)

## 2021-05-10 NOTE — Patient Instructions (Addendum)
Pre visit review using our clinic review tool, if applicable. No additional management support is needed unless otherwise documented below in the visit note.  Change weekly dose to take 2.5mg (1 tablet of 2.5mg ) daily except take 3.5mg (1 tablet of 2.5mg  and 1 tablet of 1mg ) on Mon, Wed, and Fri. Recheck in 1 week.

## 2021-05-10 NOTE — Progress Notes (Signed)
Change weekly dose to take 2.5mg (1 tablet of 2.5mg ) daily except take 3.5mg (1 tablet of 2.5mg  and 1 tablet of 1mg ) on Mon, Wed, and Fri. Recheck in 1 week.

## 2021-05-17 ENCOUNTER — Ambulatory Visit: Payer: Medicare Other

## 2021-05-17 DIAGNOSIS — H353223 Exudative age-related macular degeneration, left eye, with inactive scar: Secondary | ICD-10-CM | POA: Diagnosis not present

## 2021-05-17 DIAGNOSIS — H35372 Puckering of macula, left eye: Secondary | ICD-10-CM | POA: Diagnosis not present

## 2021-05-17 DIAGNOSIS — H43813 Vitreous degeneration, bilateral: Secondary | ICD-10-CM | POA: Diagnosis not present

## 2021-05-17 DIAGNOSIS — H353211 Exudative age-related macular degeneration, right eye, with active choroidal neovascularization: Secondary | ICD-10-CM | POA: Diagnosis not present

## 2021-05-19 ENCOUNTER — Ambulatory Visit: Payer: Medicare Other

## 2021-05-19 ENCOUNTER — Ambulatory Visit (INDEPENDENT_AMBULATORY_CARE_PROVIDER_SITE_OTHER): Payer: Medicare Other

## 2021-05-19 ENCOUNTER — Other Ambulatory Visit: Payer: Self-pay

## 2021-05-19 DIAGNOSIS — Z7901 Long term (current) use of anticoagulants: Secondary | ICD-10-CM

## 2021-05-19 LAB — POCT INR: INR: 2.9 (ref 2.0–3.0)

## 2021-05-19 NOTE — Progress Notes (Signed)
Take 1 mg today and then change weekly dose to take 2.5 mg daily except take 3.5 mg on Mon and Fri. Recheck on 12/15.

## 2021-05-19 NOTE — Patient Instructions (Addendum)
Pre visit review using our clinic review tool, if applicable. No additional management support is needed unless otherwise documented below in the visit note.  Take 1 mg today and then change weekly dose to take 2.5 mg daily except take 3.5 mg on Mon and Fri. Recheck on 12/15.

## 2021-05-26 ENCOUNTER — Ambulatory Visit (INDEPENDENT_AMBULATORY_CARE_PROVIDER_SITE_OTHER): Payer: Medicare Other

## 2021-05-26 ENCOUNTER — Other Ambulatory Visit: Payer: Self-pay

## 2021-05-26 DIAGNOSIS — Z7901 Long term (current) use of anticoagulants: Secondary | ICD-10-CM

## 2021-05-26 LAB — POCT INR: INR: 2.3 (ref 2.0–3.0)

## 2021-05-26 NOTE — Progress Notes (Signed)
Continue 2.5 mg daily except take 3.5 mg on Mon and Fri. Recheck in 4 wks.

## 2021-05-26 NOTE — Patient Instructions (Addendum)
Pre visit review using our clinic review tool, if applicable. No additional management support is needed unless otherwise documented below in the visit note.  Continue 2.5 mg daily except take 3.5 mg on Mon and Fri. Recheck in 4 wks.

## 2021-06-21 ENCOUNTER — Other Ambulatory Visit: Payer: Self-pay

## 2021-06-21 ENCOUNTER — Ambulatory Visit (INDEPENDENT_AMBULATORY_CARE_PROVIDER_SITE_OTHER): Payer: Medicare Other

## 2021-06-21 DIAGNOSIS — Z7901 Long term (current) use of anticoagulants: Secondary | ICD-10-CM | POA: Diagnosis not present

## 2021-06-21 LAB — POCT INR: INR: 2.3 (ref 2.0–3.0)

## 2021-06-21 NOTE — Progress Notes (Signed)
Continue 2.5 mg daily except take 3.5 mg on Mon and Fri. Recheck in 4 wks.

## 2021-06-21 NOTE — Patient Instructions (Addendum)
Pre visit review using our clinic review tool, if applicable. No additional management support is needed unless otherwise documented below in the visit note.  Continue 2.5 mg daily except take 3.5 mg on Mon and Fri. Recheck in 4 wks.

## 2021-06-23 ENCOUNTER — Ambulatory Visit: Payer: Medicare Other

## 2021-07-09 ENCOUNTER — Other Ambulatory Visit: Payer: Self-pay | Admitting: Cardiovascular Disease

## 2021-07-12 DIAGNOSIS — H35372 Puckering of macula, left eye: Secondary | ICD-10-CM | POA: Diagnosis not present

## 2021-07-12 DIAGNOSIS — H43813 Vitreous degeneration, bilateral: Secondary | ICD-10-CM | POA: Diagnosis not present

## 2021-07-12 DIAGNOSIS — H353223 Exudative age-related macular degeneration, left eye, with inactive scar: Secondary | ICD-10-CM | POA: Diagnosis not present

## 2021-07-12 DIAGNOSIS — H353211 Exudative age-related macular degeneration, right eye, with active choroidal neovascularization: Secondary | ICD-10-CM | POA: Diagnosis not present

## 2021-07-18 ENCOUNTER — Encounter: Payer: Self-pay | Admitting: Internal Medicine

## 2021-07-18 NOTE — Progress Notes (Signed)
Subjective:    Patient ID: Judy [name], female    DOB: Aug 07, 1930, 88 y.o.   MRN: [id number]  This visit occurred during the SARS-CoV-2 public health emergency.  Safety protocols were in place, including screening questions prior to the visit, additional usage of staff PPE, and extensive cleaning of exam room while observing appropriate contact time as indicated for disinfecting solutions.     HPI The patient is here for follow up of their chronic medical problems, including DM, Afib, htn, hld, h/o CVA, neuropathy, B12 def.  She is here today with her daughter Judy [name]  Has PND.  She is only allergic to dust based on allergy testing years ago.  Nasal sprays and allegra have not helped.  No sinus pressure.  No gerd  No other concerns.  Medications and allergies reviewed with patient and updated if appropriate.  Patient Active Problem List   Diagnosis Date Noted   Bilateral primary osteoarthritis of knee 07/19/2021   Acute gout involving toe of right foot 10/13/2020   Fatigue 10/12/2020   Pneumonia due to COVID-19 virus 08/03/2020   Cold sore 07/27/2020   COVID 07/19/2020   History of R cerebellar stroke 08/11/2018   B12 deficiency 02/04/2018   Allergic rhinitis 08/06/2017   Diabetes ([place]) 09/27/2015   Peripheral neuropathy 09/22/2015   Encounter for therapeutic drug monitoring 07/25/2013   Macular degeneration, wet ([facility])    LBBB (left bundle branch block) 03/21/2011   Long term (current) use of anticoagulants 09/08/2010   Carotid artery disease ([place]) 08/31/2009   Mixed hyperlipidemia 12/16/2008   Essential hypertension, benign 12/16/2008   CAD, NATIVE VESSEL 12/16/2008   ATRIAL FIBRILLATION 12/16/2008    Current Outpatient Medications on File Prior to Visit  Medication Sig Dispense Refill   acetaminophen (TYLENOL) 500 MG tablet Take 500 mg by mouth every 6 (six) hours as needed.     aspirin 81 MG tablet Take 81 mg by mouth daily.     azelastine (ASTELIN) 0.1 % nasal  spray Place 2 sprays into the nose 2 (two) times daily as needed for rhinitis or allergies.     b complex vitamins tablet Take 1 tablet by mouth daily.     Colchicine (MITIGARE) 0.6 MG CAPS Take 2 caps once and then one hour later take 1 cap 3 capsule 0   diltiazem (CARDIZEM CD) 120 MG 24 hr capsule TAKE 1 CAPSULE BY MOUTH DAILY. PLEASE KEEP UPCOMING APPOINTMENT FOR FURTHER REFILLS 90 capsule 0   Fexofenadine HCl (ALLEGRA ALLERGY PO) Take 1 tablet by mouth daily.      furosemide (LASIX) 20 MG tablet Take 1 tablet (20 mg total) by mouth daily. 90 tablet 3   isosorbide mononitrate (IMDUR) 30 MG 24 hr tablet Take 1 tablet (30 mg total) by mouth at bedtime. 90 tablet 3   lisinopril (ZESTRIL) 10 MG tablet Take 1 tablet (10 mg total) by mouth daily. 90 tablet 3   meclizine (ANTIVERT) 25 MG tablet Take 1 tablet (25 mg total) by mouth 3 (three) times daily as needed for dizziness. 30 tablet 5   metoprolol succinate (TOPROL-XL) 50 MG 24 hr tablet Take 1 tablet (50 mg total) by mouth daily. TAKE WITH OR IMMEDIATELY FOLLOWING A MEAL. 90 tablet 3   Multiple Vitamin (MULTIVITAMIN WITH MINERALS) TABS tablet Take 1 tablet by mouth daily.     nitroGLYCERIN (NITROSTAT) 0.4 MG SL tablet PLACE 1 TABLET UNDER TONGUE EVERY 5 MINUTES AS NEEDED FOR CHEST PAIN 25 tablet  8   potassium chloride (MICRO-K) 10 MEQ CR capsule TAKE 1 CAPSULE BY MOUTH EVERY DAY 90 capsule 3   rosuvastatin (CRESTOR) 10 MG tablet TAKE 1 TABLET BY MOUTH EVERY DAY 90 tablet 2   sodium chloride (OCEAN) 0.65 % SOLN nasal spray Place 1 spray into both nostrils as needed for congestion.     tobramycin (TOBREX) 0.3 % ophthalmic solution Place 1 drop into both eyes daily as needed.     warfarin (COUMADIN) 1 MG tablet TAKE 1 TABLET BY MOUTH MONDAYS, WEDNESDAYS, FRIDAYS AND SATURDAYS OR AS DIRECTED BY ANTICOAGULATION CLINIC 30 tablet 1   warfarin (COUMADIN) 2.5 MG tablet TAKE 1 TABLET DAILY OR TAKE AS DIRECTED BY ANTICOAGULATION CLINIC 100 tablet 1   No  current facility-administered medications on file prior to visit.    Past Medical History:  Diagnosis Date   Atrial fibrillation ([place])    a. chronic anticoag, failed prior DCCV;  b. 03/2010 Echo: EF 55-65%, mod MR, mildly to mod dil LA, mod dil RA, mild to mod MR.   CAD (coronary artery disease)    a. BMS to RCA 96';  b. Stent PDA and OM DES 2006   CAROTID ARTERY DISEASE    a. 11/2011 Carotid U/S:  0-39% bilat dzs.   HTN (hypertension)    Hyperlipidemia    LBBB (left bundle branch block)    Macular degeneration, wet ([place]) 10/2012 dx   L>R, follows with retinal specialist->injections monthly.   Obesity    Vertigo     Past Surgical History:  Procedure Laterality Date   [place], 2006 x2   Drug-eluting stent placement to the PDA, drug-eluting stent  placement to the first obtuse  marginal, StarClose closure of the right common femoral arteriotomy site. Successful drug-eluting stent  placement in  both the posterior descending artery and the obtuse marginal. The lesion was directly stented using a 2.5 x 16 mm Taxus deployed at 14 atmospheres.    EYE SURGERY     Tear duct stent    Social History   Socioeconomic History   Marital status: Widowed    Spouse name: Not on file   Number of children: Not on file   Years of education: Not on file   Highest education level: Not on file  Occupational History   Not on file  Tobacco Use   Smoking status: Never   Smokeless tobacco: Never  Vaping Use   Vaping Use: Never used  Substance and Sexual Activity   Alcohol use: No   Drug use: No   Sexual activity: Not on file  Other Topics Concern   Not on file  Social History Narrative   Lives in [place] by herself.  She is active around the house w/o symptoms or limitations.   Social Determinants of Health   Financial Resource Strain: Low Risk    Difficulty of Paying Living Expenses: Not hard at all  Food Insecurity: No Food Insecurity   Worried About Sales executive in the Last Year: Never true   [place] in the Last Year: Never true  Transportation Needs: No Transportation Needs   Lack of Transportation (Medical): No   Lack of Transportation (Non-Medical): No  Physical Activity: Insufficiently Active   Days of Exercise per Week: 2 days   Minutes of Exercise per Session: 30 min  Stress: No Stress Concern Present   Feeling of Stress : Not at all  Social Connections: Moderately Isolated  Frequency of Communication with Friends and Family: Twice a week   Frequency of Social Gatherings with Friends and Family: Twice a week   Attends Religious Services: 1 to 4 times per year   Active Member of [employer] or Organizations: No   Attends Archivist Meetings: Never   Marital Status: Widowed    Family History  Problem Relation Age of Onset   Arthritis Mother        died in her 61's   Arthritis Father        died in [place].   CAD Brother        deceased   CAD Brother        deceased   CAD Sister        deceased    Review of Systems  Constitutional:  Negative for fever.  Respiratory:  Negative for cough, shortness of breath and wheezing.   Cardiovascular:  Positive for leg swelling (occ). Negative for chest pain and palpitations.  Neurological:  Negative for light-headedness and headaches.      Objective:   Vitals:   07/19/21 1002  BP: 132/72  Pulse: 68  Temp: (!) 97.4 F (36.3 C)  SpO2: 98%   BP Readings from Last 3 Encounters:  07/19/21 132/72  04/13/21 132/84  12/28/20 130/70   Wt Readings from Last 3 Encounters:  07/19/21 148 lb 9.6 oz (67.4 kg)  04/13/21 134 lb 6.4 oz (61 kg)  12/28/20 143 lb (64.9 kg)   Body mass index is 26.32 kg/m.   Physical Exam    Constitutional: Appears well-developed and well-nourished. No distress.  HENT:  Head: Normocephalic and atraumatic.  Neck: Neck supple. No tracheal deviation present. No thyromegaly present.  No cervical lymphadenopathy Cardiovascular: Normal rate,  irregular rhythm and normal heart sounds.  No murmur heard. No carotid bruit .  Trace bilateral lower extremity edema Pulmonary/Chest: Effort normal and breath sounds normal. No respiratory distress. No has no wheezes. No rales.  Skin: Skin is warm and dry. Not diaphoretic.  Psychiatric: Normal mood and affect. Behavior is normal.      Assessment & Plan:    See Problem List for Assessment and Plan of chronic medical problems.

## 2021-07-18 NOTE — Patient Instructions (Addendum)
    Blood work was ordered.      Medications changes include :   none     Please followup in 6 months  

## 2021-07-19 ENCOUNTER — Ambulatory Visit: Payer: Medicare Other

## 2021-07-19 ENCOUNTER — Other Ambulatory Visit: Payer: Self-pay

## 2021-07-19 ENCOUNTER — Ambulatory Visit (INDEPENDENT_AMBULATORY_CARE_PROVIDER_SITE_OTHER): Payer: Medicare Other | Admitting: Internal Medicine

## 2021-07-19 ENCOUNTER — Ambulatory Visit (INDEPENDENT_AMBULATORY_CARE_PROVIDER_SITE_OTHER): Payer: Medicare Other

## 2021-07-19 VITALS — BP 132/72 | HR 68 | Temp 97.4°F | Ht 63.0 in | Wt 148.6 lb

## 2021-07-19 DIAGNOSIS — G6289 Other specified polyneuropathies: Secondary | ICD-10-CM | POA: Diagnosis not present

## 2021-07-19 DIAGNOSIS — I4891 Unspecified atrial fibrillation: Secondary | ICD-10-CM

## 2021-07-19 DIAGNOSIS — I10 Essential (primary) hypertension: Secondary | ICD-10-CM

## 2021-07-19 DIAGNOSIS — E782 Mixed hyperlipidemia: Secondary | ICD-10-CM

## 2021-07-19 DIAGNOSIS — E119 Type 2 diabetes mellitus without complications: Secondary | ICD-10-CM

## 2021-07-19 DIAGNOSIS — Z7901 Long term (current) use of anticoagulants: Secondary | ICD-10-CM

## 2021-07-19 DIAGNOSIS — Z8673 Personal history of transient ischemic attack (TIA), and cerebral infarction without residual deficits: Secondary | ICD-10-CM | POA: Diagnosis not present

## 2021-07-19 DIAGNOSIS — E538 Deficiency of other specified B group vitamins: Secondary | ICD-10-CM | POA: Diagnosis not present

## 2021-07-19 DIAGNOSIS — M170 Bilateral primary osteoarthritis of knee: Secondary | ICD-10-CM | POA: Diagnosis not present

## 2021-07-19 DIAGNOSIS — H353290 Exudative age-related macular degeneration, unspecified eye, stage unspecified: Secondary | ICD-10-CM

## 2021-07-19 LAB — CBC WITH DIFFERENTIAL/PLATELET
Basophils Absolute: 0 10*3/uL (ref 0.0–0.1)
Basophils Relative: 0.4 % (ref 0.0–3.0)
Eosinophils Absolute: 0.1 10*3/uL (ref 0.0–0.7)
Eosinophils Relative: 1.6 % (ref 0.0–5.0)
HCT: 38.3 % (ref 36.0–46.0)
Hemoglobin: 12.6 g/dL (ref 12.0–15.0)
Lymphocytes Relative: 22.9 % (ref 12.0–46.0)
Lymphs Abs: 2.1 10*3/uL (ref 0.7–4.0)
MCHC: 32.9 g/dL (ref 30.0–36.0)
MCV: 88.8 fl (ref 78.0–100.0)
Monocytes Absolute: 1.2 10*3/uL — ABNORMAL HIGH (ref 0.1–1.0)
Monocytes Relative: 13 % — ABNORMAL HIGH (ref 3.0–12.0)
Neutro Abs: 5.7 10*3/uL (ref 1.4–7.7)
Neutrophils Relative %: 62.1 % (ref 43.0–77.0)
Platelets: 217 10*3/uL (ref 150.0–400.0)
RBC: 4.32 Mil/uL (ref 3.87–5.11)
RDW: 13.8 % (ref 11.5–15.5)
WBC: 9.2 10*3/uL (ref 4.0–10.5)

## 2021-07-19 LAB — COMPREHENSIVE METABOLIC PANEL
ALT: 11 U/L (ref 0–35)
AST: 17 U/L (ref 0–37)
Albumin: 4 g/dL (ref 3.5–5.2)
Alkaline Phosphatase: 66 U/L (ref 39–117)
BUN: 16 mg/dL (ref 6–23)
CO2: 31 mEq/L (ref 19–32)
Calcium: 9.6 mg/dL (ref 8.4–10.5)
Chloride: 104 mEq/L (ref 96–112)
Creatinine, Ser: 1.04 mg/dL (ref 0.40–1.20)
GFR: 47.84 mL/min — ABNORMAL LOW (ref >60.00)
Glucose, Bld: 76 mg/dL (ref 70–99)
Potassium: 4.2 mEq/L (ref 3.5–5.1)
Sodium: 141 mEq/L (ref 135–145)
Total Bilirubin: 0.3 mg/dL (ref 0.2–1.2)
Total Protein: 6.9 g/dL (ref 6.0–8.3)

## 2021-07-19 LAB — HEMOGLOBIN A1C: Hgb A1c MFr Bld: 6.1 % (ref 4.6–6.5)

## 2021-07-19 LAB — LIPID PANEL
Cholesterol: 151 mg/dL (ref 0–200)
HDL: 53.7 mg/dL (ref >39.00)
LDL Cholesterol: 73 mg/dL (ref 0–99)
NonHDL: 97.31
Total CHOL/HDL Ratio: 3
Triglycerides: 123 mg/dL (ref 0.0–149.0)
VLDL: 24.6 mg/dL (ref 0.0–40.0)

## 2021-07-19 LAB — POCT INR: INR: 2.4 (ref 2.0–3.0)

## 2021-07-19 LAB — VITAMIN B12: Vitamin B-12: 397 pg/mL (ref 211–911)

## 2021-07-19 NOTE — Assessment & Plan Note (Signed)
Chronic s/p injections

## 2021-07-19 NOTE — Assessment & Plan Note (Signed)
Chronic History of stroke Continue warfarin, aspirin 81 mg, Crestor 10 mg daily Blood pressure well controlled Sugars controlled

## 2021-07-19 NOTE — Assessment & Plan Note (Addendum)
Chronic Not on any medication  

## 2021-07-19 NOTE — Patient Instructions (Addendum)
Pre visit review using our clinic review tool, if applicable. No additional management support is needed unless otherwise documented below in the visit note.  Continue 2.5 mg daily except take 3.5 mg on Mon and Fri. Recheck in 4 wks.

## 2021-07-19 NOTE — Assessment & Plan Note (Signed)
Chronic Taking B12 supplementation Check B12 level 

## 2021-07-19 NOTE — Assessment & Plan Note (Signed)
Chronic Check A1c  Lab Results  Component Value Date   HGBA1C 5.8 (H) 02/17/2020    Sugars have been controlled with lifestyle

## 2021-07-19 NOTE — Assessment & Plan Note (Signed)
Chronic Blood pressure well controlled CMP Continue metoprolol XL 50 mg daily, lisinopril 10 mg daily, Imdur 30 mg daily, diltiazem 120 mg daily

## 2021-07-19 NOTE — Progress Notes (Signed)
Continue 2.5 mg daily except take 3.5 mg on Mon and Fri. Recheck in 4 wks.

## 2021-07-19 NOTE — Assessment & Plan Note (Signed)
Chronic Regular exercise and healthy diet encouraged Check lipid panel  Continue rosuvastatin 10 mg daily 

## 2021-07-19 NOTE — Assessment & Plan Note (Signed)
Chronic Following with cardiology On diltiazem 120 mg daily, metoprolol XL 50 mg daily and warfarin which is monitored by the Coumadin clinic CBC, CMP

## 2021-07-19 NOTE — Assessment & Plan Note (Signed)
chronic Right eye injections

## 2021-08-16 ENCOUNTER — Ambulatory Visit (INDEPENDENT_AMBULATORY_CARE_PROVIDER_SITE_OTHER): Payer: Medicare Other

## 2021-08-16 ENCOUNTER — Other Ambulatory Visit: Payer: Self-pay

## 2021-08-16 DIAGNOSIS — Z7901 Long term (current) use of anticoagulants: Secondary | ICD-10-CM

## 2021-08-16 LAB — POCT INR: INR: 2.7 (ref 2.0–3.0)

## 2021-08-16 NOTE — Patient Instructions (Addendum)
Pre visit review using our clinic review tool, if applicable. No additional management support is needed unless otherwise documented below in the visit note. ? ?Only take 1 mg today and then continue 2.5 mg daily except take 3.5 mg on Mon and Fri. Recheck in 2 wks. ?

## 2021-08-16 NOTE — Progress Notes (Signed)
Only take 1 mg today and then continue 2.5 mg daily except take 3.5 mg on Mon and Fri. Recheck in 2 wks. ?

## 2021-08-30 ENCOUNTER — Other Ambulatory Visit: Payer: Self-pay

## 2021-08-30 ENCOUNTER — Ambulatory Visit (INDEPENDENT_AMBULATORY_CARE_PROVIDER_SITE_OTHER): Payer: Medicare Other

## 2021-08-30 DIAGNOSIS — Z7901 Long term (current) use of anticoagulants: Secondary | ICD-10-CM

## 2021-08-30 LAB — POCT INR: INR: 2.2 (ref 2.0–3.0)

## 2021-08-30 NOTE — Patient Instructions (Addendum)
Pre visit review using our clinic review tool, if applicable. No additional management support is needed unless otherwise documented below in the visit note. ? ?Continue 2.5 mg daily except take 3.5 mg on Mon and Fri. Recheck in 4 wks. ?

## 2021-08-30 NOTE — Progress Notes (Signed)
Continue 2.5 mg daily except take 3.5 mg on Mon and Fri. Recheck in 4 wks. ?

## 2021-09-06 DIAGNOSIS — H353223 Exudative age-related macular degeneration, left eye, with inactive scar: Secondary | ICD-10-CM | POA: Diagnosis not present

## 2021-09-06 DIAGNOSIS — H35372 Puckering of macula, left eye: Secondary | ICD-10-CM | POA: Diagnosis not present

## 2021-09-06 DIAGNOSIS — H43813 Vitreous degeneration, bilateral: Secondary | ICD-10-CM | POA: Diagnosis not present

## 2021-09-06 DIAGNOSIS — H353211 Exudative age-related macular degeneration, right eye, with active choroidal neovascularization: Secondary | ICD-10-CM | POA: Diagnosis not present

## 2021-09-27 ENCOUNTER — Ambulatory Visit (INDEPENDENT_AMBULATORY_CARE_PROVIDER_SITE_OTHER): Payer: Medicare Other

## 2021-09-27 DIAGNOSIS — Z7901 Long term (current) use of anticoagulants: Secondary | ICD-10-CM | POA: Diagnosis not present

## 2021-09-27 LAB — POCT INR: INR: 3.6 — AB (ref 2.0–3.0)

## 2021-09-27 NOTE — Patient Instructions (Addendum)
Pre visit review using our clinic review tool, if applicable. No additional management support is needed unless otherwise documented below in the visit note. ? ?Hold dose today and hold dose tomorrow and then change weekly dose to take 2.5 mg daily except take 1.25 mg on Wednesdays. Recheck in 2 weeks.  ?

## 2021-09-27 NOTE — Progress Notes (Signed)
Pt reports she has been drinking cranberry juice lately and not eating as many greens as she normally does. She has also started a medication to help dry mouth but does not remember the name of it. She is going to bring it with her to the next apt.  ?Hold dose today and hold dose tomorrow and then change weekly dose to take 2.5 mg daily except take 1.25 mg on Wednesdays. Recheck in 2 weeks.  ?

## 2021-10-04 ENCOUNTER — Telehealth: Payer: Self-pay | Admitting: Internal Medicine

## 2021-10-04 NOTE — Telephone Encounter (Signed)
Pts daughter requesting a cb to pt w/ 07-19-2021 lab results ? ? ?

## 2021-10-05 NOTE — Telephone Encounter (Signed)
Spoke with patient and all questions answered.

## 2021-10-11 ENCOUNTER — Ambulatory Visit (INDEPENDENT_AMBULATORY_CARE_PROVIDER_SITE_OTHER): Payer: Medicare Other

## 2021-10-11 DIAGNOSIS — Z7901 Long term (current) use of anticoagulants: Secondary | ICD-10-CM | POA: Diagnosis not present

## 2021-10-11 LAB — POCT INR: INR: 1.9 — AB (ref 2.0–3.0)

## 2021-10-11 NOTE — Progress Notes (Signed)
Increase dose today to take 1 1/2 tablets and then continue 2.5 mg daily except take 1.25 mg on Wednesdays. Recheck in 3 weeks.  ?

## 2021-10-11 NOTE — Patient Instructions (Addendum)
Pre visit review using our clinic review tool, if applicable. No additional management support is needed unless otherwise documented below in the visit note. ? ?Increase dose today to take 1 1/2 tablets and then continue 2.5 mg daily except take 1.25 mg on Wednesdays. Recheck in 3 weeks.  ?

## 2021-10-21 ENCOUNTER — Other Ambulatory Visit: Payer: Self-pay | Admitting: Internal Medicine

## 2021-10-21 DIAGNOSIS — Z7901 Long term (current) use of anticoagulants: Secondary | ICD-10-CM

## 2021-10-24 NOTE — Telephone Encounter (Signed)
Pt is compliant with warfarin management and PCP apts. ?Sent in refill.  ?

## 2021-11-01 ENCOUNTER — Ambulatory Visit (INDEPENDENT_AMBULATORY_CARE_PROVIDER_SITE_OTHER): Payer: Medicare Other

## 2021-11-01 DIAGNOSIS — Z7901 Long term (current) use of anticoagulants: Secondary | ICD-10-CM

## 2021-11-01 DIAGNOSIS — H353211 Exudative age-related macular degeneration, right eye, with active choroidal neovascularization: Secondary | ICD-10-CM | POA: Diagnosis not present

## 2021-11-01 DIAGNOSIS — H353223 Exudative age-related macular degeneration, left eye, with inactive scar: Secondary | ICD-10-CM | POA: Diagnosis not present

## 2021-11-01 DIAGNOSIS — H43813 Vitreous degeneration, bilateral: Secondary | ICD-10-CM | POA: Diagnosis not present

## 2021-11-01 DIAGNOSIS — H35372 Puckering of macula, left eye: Secondary | ICD-10-CM | POA: Diagnosis not present

## 2021-11-01 LAB — POCT INR: INR: 2.2 (ref 2.0–3.0)

## 2021-11-01 NOTE — Progress Notes (Signed)
Continue 2.5 mg daily except take 1.25 mg on Wednesdays. Recheck in 4 weeks.

## 2021-11-01 NOTE — Patient Instructions (Addendum)
Pre visit review using our clinic review tool, if applicable. No additional management support is needed unless otherwise documented below in the visit note.  Continue 2.5 mg daily except take 1.25 mg on Wednesdays. Recheck in 4 weeks.

## 2021-11-02 DIAGNOSIS — M170 Bilateral primary osteoarthritis of knee: Secondary | ICD-10-CM | POA: Diagnosis not present

## 2021-11-02 DIAGNOSIS — M25562 Pain in left knee: Secondary | ICD-10-CM | POA: Diagnosis not present

## 2021-11-27 ENCOUNTER — Other Ambulatory Visit: Payer: Self-pay | Admitting: Cardiovascular Disease

## 2021-11-29 ENCOUNTER — Ambulatory Visit (INDEPENDENT_AMBULATORY_CARE_PROVIDER_SITE_OTHER): Payer: Medicare Other

## 2021-11-29 DIAGNOSIS — Z7901 Long term (current) use of anticoagulants: Secondary | ICD-10-CM | POA: Diagnosis not present

## 2021-11-29 LAB — POCT INR: INR: 3.2 — AB (ref 2.0–3.0)

## 2021-11-29 NOTE — Progress Notes (Addendum)
Pt reports taking Aleve a few days ago for knee pain. Educated pt concerning using NSAIDS. Pt verbalized understanding.  Hold dose today and then change weekly dose to take 2.5 mg daily except take 1.25 mg on Wednesdays and Sundays. Recheck in 1 weeks.

## 2021-11-29 NOTE — Patient Instructions (Addendum)
Pre visit review using our clinic review tool, if applicable. No additional management support is needed unless otherwise documented below in the visit note.  Hold dose today and then change weekly dose to take 2.5 mg daily except take 1.25 mg on Wednesdays and Sundays. Recheck in 1 weeks.

## 2021-11-30 ENCOUNTER — Other Ambulatory Visit: Payer: Self-pay | Admitting: Cardiovascular Disease

## 2021-12-06 ENCOUNTER — Ambulatory Visit (INDEPENDENT_AMBULATORY_CARE_PROVIDER_SITE_OTHER): Payer: Medicare Other

## 2021-12-06 DIAGNOSIS — Z7901 Long term (current) use of anticoagulants: Secondary | ICD-10-CM

## 2021-12-06 LAB — POCT INR: INR: 2.3 (ref 2.0–3.0)

## 2021-12-28 DIAGNOSIS — M170 Bilateral primary osteoarthritis of knee: Secondary | ICD-10-CM | POA: Diagnosis not present

## 2022-01-01 ENCOUNTER — Other Ambulatory Visit: Payer: Self-pay | Admitting: Cardiovascular Disease

## 2022-01-03 ENCOUNTER — Ambulatory Visit (INDEPENDENT_AMBULATORY_CARE_PROVIDER_SITE_OTHER): Payer: Medicare Other

## 2022-01-03 DIAGNOSIS — H35372 Puckering of macula, left eye: Secondary | ICD-10-CM | POA: Diagnosis not present

## 2022-01-03 DIAGNOSIS — H353211 Exudative age-related macular degeneration, right eye, with active choroidal neovascularization: Secondary | ICD-10-CM | POA: Diagnosis not present

## 2022-01-03 DIAGNOSIS — H353223 Exudative age-related macular degeneration, left eye, with inactive scar: Secondary | ICD-10-CM | POA: Diagnosis not present

## 2022-01-03 DIAGNOSIS — Z7901 Long term (current) use of anticoagulants: Secondary | ICD-10-CM | POA: Diagnosis not present

## 2022-01-03 DIAGNOSIS — H35423 Microcystoid degeneration of retina, bilateral: Secondary | ICD-10-CM | POA: Diagnosis not present

## 2022-01-03 LAB — POCT INR: INR: 1.6 — AB (ref 2.0–3.0)

## 2022-01-03 NOTE — Progress Notes (Addendum)
Pt reports eating a lot of tomatoes and salad. Pt was in range on this dose at last visit so no long term change will be made to dosing.  Increase dose today to take 3.75 mg and then continue 2.5 mg daily except take 1.25 mg on Wednesdays and Sundays. Recheck in 3 weeks.

## 2022-01-03 NOTE — Patient Instructions (Addendum)
Pre visit review using our clinic review tool, if applicable. No additional management support is needed unless otherwise documented below in the visit note.  Increase dose today to take 3.75 mg and then continue 2.5 mg daily except take 1.25 mg on Wednesdays and Sundays. Recheck in 3 weeks.

## 2022-01-04 DIAGNOSIS — M170 Bilateral primary osteoarthritis of knee: Secondary | ICD-10-CM | POA: Diagnosis not present

## 2022-01-23 DIAGNOSIS — M545 Low back pain, unspecified: Secondary | ICD-10-CM | POA: Diagnosis not present

## 2022-01-24 ENCOUNTER — Telehealth: Payer: Self-pay

## 2022-01-24 ENCOUNTER — Ambulatory Visit (INDEPENDENT_AMBULATORY_CARE_PROVIDER_SITE_OTHER): Payer: Medicare Other

## 2022-01-24 DIAGNOSIS — Z7901 Long term (current) use of anticoagulants: Secondary | ICD-10-CM | POA: Diagnosis not present

## 2022-01-24 LAB — POCT INR: INR: 3.6 — AB (ref 2.0–3.0)

## 2022-01-24 NOTE — Telephone Encounter (Signed)
Pt was in today for coumadin clinic apt and will need to return in one week due to elevated INR, 3.6. Coumadin clinic at [facility] is Tues and Fri. Pt reported she has a 6 month f/u with the provider on 8/29. She is wondering if she could see the provider on 8/22 also to combine the apts. Her daughter cannot bring her on a Friday due to work. Pt is aware PCP is out of the office this week and will return on 8/21. Advised a msg could be sent to the provider, but currently the provider does not have any openings on 8/22. Pt reports if PCP cannot change the apt it will be ok and her daughter will bring her oin 8/29.  Pt also wanted PCP to know that she obtained a back injury, pulled muscle, at home and was seen by [facility]. They prescribed prednisone, 10 mg BID x 14 days starting yesterday, 8/14. Pt has also been taking more tylenol than she usually dose. She has been taking 2 tablets BID but has dropped it to 1 BID. Due to this change is medication she will need to recheck INR in 1 week.  Pt is concerned that the PCP may want to do labs during her apt with her and they that the prednisone may alter some of the results. Advised this could also be discussed with the PCP.  Pt verbalized understanding.

## 2022-01-24 NOTE — Patient Instructions (Addendum)
Pre visit review using our clinic review tool, if applicable. No additional management support is needed unless otherwise documented below in the visit note.  Hold dose today and hold dose tomorrow and then change weekly dose to take 1/2 tablet daily except take 1 tablet on Mondays, Thursdays and Saturdays. Recheck in 1 week.

## 2022-01-24 NOTE — Progress Notes (Addendum)
Pt obtained back injury, pulled muscle, at home and was seen by [facility]. They prescribed prednisone, 10 mg BID x 14 days starting yesterday, 8/14. Pt has also been taking more tylenol than she usually dose. She has been taking 2 tablets BID but has dropped it to 1 BID.  Hold dose today and hold dose tomorrow and then change weekly dose to take 1/2 tablet daily except take 1 tablet on Mondays, Thursdays and Saturdays. Recheck in 1 week.  Pt also has an apt with PCP in 2 weeks. She would like to combine apts if possible. Will send msg to PCP concerning changing PCP apt to 8/22 instead of 8/29. Pt is aware provider is out of the office this week and will not return until 8/21. Pt was also concerned if she would need labs at PCP apt if prednisone would alter results. Advised this can be discussed with PCP.

## 2022-01-25 NOTE — Telephone Encounter (Signed)
Appointment scheduled and patient notified.

## 2022-01-30 ENCOUNTER — Encounter: Payer: Self-pay | Admitting: Internal Medicine

## 2022-01-30 NOTE — Progress Notes (Unsigned)
Subjective:    Patient ID: Judy [name], female    DOB: December 22, 1930, 88 y.o.   MRN: [id number]     HPI Judy [name] is here for follow up of her chronic medical problems, including DM, afib, htn, hld, h/o CVA, neuropathy, B12 def  She pulled heavy wet clothes out of the washer and she pulled something in her right side. She has been on prednisone - finished the prednisone last night.  Her pain is improving.  She fell Friday night - got her feet mixed up and stumbled and hit her right arm - fell.  She hit her head minimally - 4 days ago.  She denies headaches, N/T and confusion.    Gas x couple of days which cause discomfort- started taking omeprazole 20 mg daily.  She has not had any pain or discomfort since then.  She denies a history of reflux.  She is not sure why she had it.     Medications and allergies reviewed with patient and updated if appropriate.  Current Outpatient Medications on File Prior to Visit  Medication Sig Dispense Refill   acetaminophen (TYLENOL) 500 MG tablet Take 500 mg by mouth every 6 (six) hours as needed.     aspirin 81 MG tablet Take 81 mg by mouth daily.     azelastine (ASTELIN) 0.1 % nasal spray Place 2 sprays into the nose 2 (two) times daily as needed for rhinitis or allergies.     b complex vitamins tablet Take 1 tablet by mouth daily.     Colchicine (MITIGARE) 0.6 MG CAPS Take 2 caps once and then one hour later take 1 cap 3 capsule 0   diltiazem (CARDIZEM CD) 120 MG 24 hr capsule Take 1 capsule (120 mg total) by mouth daily. 90 capsule 1   Fexofenadine HCl (ALLEGRA ALLERGY PO) Take 1 tablet by mouth daily.      furosemide (LASIX) 20 MG tablet TAKE 1 TABLET BY MOUTH EVERY DAY 90 tablet 0   isosorbide mononitrate (IMDUR) 30 MG 24 hr tablet TAKE 1 TABLET BY MOUTH AT BEDTIME. 90 tablet 0   lisinopril (ZESTRIL) 10 MG tablet Take 1 tablet (10 mg total) by mouth daily. Please call [phone] to schedule an appointment for October for future refills.  Thank you. 90 tablet 0   meclizine (ANTIVERT) 25 MG tablet Take 1 tablet (25 mg total) by mouth 3 (three) times daily as needed for dizziness. 30 tablet 5   metoprolol succinate (TOPROL-XL) 50 MG 24 hr tablet Take 1 tablet (50 mg total) by mouth daily. TAKE WITH OR IMMEDIATELY FOLLOWING A MEAL. Please make yearly appointment with Dr. Johnsie Cancel for November 2023 before anymore refills. Thank you. 1st attempt 90 tablet 0   Multiple Vitamin (MULTIVITAMIN WITH MINERALS) TABS tablet Take 1 tablet by mouth daily.     nitroGLYCERIN (NITROSTAT) 0.4 MG SL tablet PLACE 1 TABLET UNDER TONGUE EVERY 5 MINUTES AS NEEDED FOR CHEST PAIN 25 tablet 8   potassium chloride (MICRO-K) 10 MEQ CR capsule TAKE 1 CAPSULE BY MOUTH EVERY DAY 90 capsule 3   rosuvastatin (CRESTOR) 10 MG tablet TAKE 1 TABLET BY MOUTH EVERY DAY 90 tablet 2   sodium chloride (OCEAN) 0.65 % SOLN nasal spray Place 1 spray into both nostrils as needed for congestion.     tobramycin (TOBREX) 0.3 % ophthalmic solution Place 1 drop into both eyes daily as needed.     warfarin (COUMADIN) 1 MG tablet TAKE 1 TABLET BY  MOUTH MONDAYS, WEDNESDAYS, FRIDAYS AND SATURDAYS OR AS DIRECTED BY ANTICOAGULATION CLINIC 30 tablet 1   warfarin (COUMADIN) 2.5 MG tablet TAKE 1 TABLET BY MOUTH DAILY EXCEPT TAKE 1/2 TABLET ON WEDNESDAYS OR AS DIRECTED BY ANTICOAGULATION CLINIC 100 tablet 1   No current facility-administered medications on file prior to visit.     Review of Systems  Constitutional:  Negative for chills and fever.  Respiratory:  Negative for cough, shortness of breath and wheezing.   Cardiovascular:  Positive for leg swelling (LLE > RLE). Negative for chest pain and palpitations.  Neurological:  Negative for light-headedness and headaches.       Objective:   Vitals:   01/31/22 1548  BP: 130/70  Pulse: 87  Temp: 98 F (36.7 C)  SpO2: 97%   BP Readings from Last 3 Encounters:  01/31/22 130/70  07/19/21 132/72  04/13/21 132/84   Wt Readings from  Last 3 Encounters:  01/31/22 142 lb (64.4 kg)  07/19/21 148 lb 9.6 oz (67.4 kg)  04/13/21 134 lb 6.4 oz (61 kg)   Body mass index is 25.15 kg/m.    Physical Exam Constitutional:      General: She is not in acute distress.    Appearance: Normal appearance.  HENT:     Head: Normocephalic and atraumatic.  Eyes:     Conjunctiva/sclera: Conjunctivae normal.  Cardiovascular:     Rate and Rhythm: Normal rate and regular rhythm.     Heart sounds: Normal heart sounds. No murmur heard. Pulmonary:     Effort: Pulmonary effort is normal. No respiratory distress.     Breath sounds: Normal breath sounds. No wheezing.  Musculoskeletal:     Cervical back: Neck supple.     Right lower leg: No edema.     Left lower leg: No edema.  Lymphadenopathy:     Cervical: No cervical adenopathy.  Skin:    General: Skin is warm and dry.     Findings: No rash.  Neurological:     Mental Status: She is alert. Mental status is at baseline.  Psychiatric:        Mood and Affect: Mood normal.        Behavior: Behavior normal.        Lab Results  Component Value Date   WBC 9.2 07/19/2021   HGB 12.6 07/19/2021   HCT 38.3 07/19/2021   PLT 217.0 07/19/2021   GLUCOSE 76 07/19/2021   CHOL 151 07/19/2021   TRIG 123.0 07/19/2021   HDL 53.70 07/19/2021   LDLDIRECT 97.0 08/15/2019   LDLCALC 73 07/19/2021   ALT 11 07/19/2021   AST 17 07/19/2021   NA 141 07/19/2021   K 4.2 07/19/2021   CL 104 07/19/2021   CREATININE 1.04 07/19/2021   BUN 16 07/19/2021   CO2 31 07/19/2021   TSH 2.29 02/17/2020   INR 3.6 (A) 01/24/2022   HGBA1C 6.1 07/19/2021     Assessment & Plan:    See Problem List for Assessment and Plan of chronic medical problems.

## 2022-01-30 NOTE — Patient Instructions (Addendum)
     Blood work was ordered.     Medications changes include :   none      Return in about 6 months (around 08/03/2022) for follow up.

## 2022-01-31 ENCOUNTER — Ambulatory Visit (INDEPENDENT_AMBULATORY_CARE_PROVIDER_SITE_OTHER): Payer: Medicare Other | Admitting: Internal Medicine

## 2022-01-31 ENCOUNTER — Ambulatory Visit (INDEPENDENT_AMBULATORY_CARE_PROVIDER_SITE_OTHER): Payer: Medicare Other

## 2022-01-31 VITALS — BP 130/70 | HR 87 | Temp 98.0°F | Ht 63.0 in | Wt 142.0 lb

## 2022-01-31 DIAGNOSIS — E782 Mixed hyperlipidemia: Secondary | ICD-10-CM

## 2022-01-31 DIAGNOSIS — G6289 Other specified polyneuropathies: Secondary | ICD-10-CM | POA: Diagnosis not present

## 2022-01-31 DIAGNOSIS — E538 Deficiency of other specified B group vitamins: Secondary | ICD-10-CM

## 2022-01-31 DIAGNOSIS — I10 Essential (primary) hypertension: Secondary | ICD-10-CM

## 2022-01-31 DIAGNOSIS — Z8673 Personal history of transient ischemic attack (TIA), and cerebral infarction without residual deficits: Secondary | ICD-10-CM | POA: Diagnosis not present

## 2022-01-31 DIAGNOSIS — Z7901 Long term (current) use of anticoagulants: Secondary | ICD-10-CM

## 2022-01-31 DIAGNOSIS — I4891 Unspecified atrial fibrillation: Secondary | ICD-10-CM

## 2022-01-31 DIAGNOSIS — E119 Type 2 diabetes mellitus without complications: Secondary | ICD-10-CM

## 2022-01-31 LAB — CBC WITH DIFFERENTIAL/PLATELET
Basophils Absolute: 0.1 10*3/uL (ref 0.0–0.1)
Basophils Relative: 0.6 % (ref 0.0–3.0)
Eosinophils Absolute: 0.1 10*3/uL (ref 0.0–0.7)
Eosinophils Relative: 1.2 % (ref 0.0–5.0)
HCT: 39.3 % (ref 36.0–46.0)
Hemoglobin: 13.1 g/dL (ref 12.0–15.0)
Lymphocytes Relative: 29 % (ref 12.0–46.0)
Lymphs Abs: 3.2 10*3/uL (ref 0.7–4.0)
MCHC: 33.3 g/dL (ref 30.0–36.0)
MCV: 91.4 fl (ref 78.0–100.0)
Monocytes Absolute: 1.1 10*3/uL — ABNORMAL HIGH (ref 0.1–1.0)
Monocytes Relative: 9.5 % (ref 3.0–12.0)
Neutro Abs: 6.7 10*3/uL (ref 1.4–7.7)
Neutrophils Relative %: 59.7 % (ref 43.0–77.0)
Platelets: 225 10*3/uL (ref 150.0–400.0)
RBC: 4.3 Mil/uL (ref 3.87–5.11)
RDW: 15.6 % — ABNORMAL HIGH (ref 11.5–15.5)
WBC: 11.2 10*3/uL — ABNORMAL HIGH (ref 4.0–10.5)

## 2022-01-31 LAB — POCT INR: INR: 2.1 (ref 2.0–3.0)

## 2022-01-31 LAB — HEMOGLOBIN A1C: Hgb A1c MFr Bld: 6.6 % — ABNORMAL HIGH (ref 4.6–6.5)

## 2022-01-31 NOTE — Progress Notes (Signed)
Continue 1/2 tablet daily except take 1 tablet on Mondays, Thursdays and Saturdays. Recheck in 1 week.

## 2022-01-31 NOTE — Assessment & Plan Note (Addendum)
Chronic BP well controlled Continue metoprolol xl 50 mg daily, lisinopril 10 mg daily, imdur 30 mg daily, diltiazem 120 mg daily cmp

## 2022-01-31 NOTE — Assessment & Plan Note (Addendum)
Chronic Following with cardiology On dilatizem 120 mg daily, metoprolol XL 50 mg daily and warfarin CBC, CMP

## 2022-01-31 NOTE — Patient Instructions (Addendum)
Pre visit review using our clinic review tool, if applicable. No additional management support is needed unless otherwise documented below in the visit note.  Continue 1/2 tablet daily except take 1 tablet on Mondays, Thursdays and Saturdays. Recheck in 1 week.

## 2022-01-31 NOTE — Assessment & Plan Note (Signed)
Chronic Regular exercise and healthy diet encouraged Check lipid panel  Continue Crestor 10 mg daily 

## 2022-01-31 NOTE — Assessment & Plan Note (Signed)
Chronic Not on medication-monitor

## 2022-01-31 NOTE — Assessment & Plan Note (Signed)
Chronic History of stroke Continue warfarin, aspirin 81 mg daily and Crestor 10 mg daily Blood pressure well controlled Sugars well controlled

## 2022-01-31 NOTE — Assessment & Plan Note (Signed)
Chronic  Lab Results  Component Value Date   HGBA1C 6.1 07/19/2021   Sugars well controlled Check A1c Continue diet control Stressed regular exercise, diabetic diet

## 2022-01-31 NOTE — Assessment & Plan Note (Addendum)
Chronic Taking B12 Check level 

## 2022-02-01 LAB — COMPREHENSIVE METABOLIC PANEL
ALT: 49 U/L — ABNORMAL HIGH (ref 0–35)
AST: 31 U/L (ref 0–37)
Albumin: 3.9 g/dL (ref 3.5–5.2)
Alkaline Phosphatase: 53 U/L (ref 39–117)
BUN: 25 mg/dL — ABNORMAL HIGH (ref 6–23)
CO2: 29 mEq/L (ref 19–32)
Calcium: 9.2 mg/dL (ref 8.4–10.5)
Chloride: 103 mEq/L (ref 96–112)
Creatinine, Ser: 1.53 mg/dL — ABNORMAL HIGH (ref 0.40–1.20)
GFR: 29.99 mL/min — ABNORMAL LOW (ref >60.00)
Glucose, Bld: 105 mg/dL — ABNORMAL HIGH (ref 70–99)
Potassium: 4.4 mEq/L (ref 3.5–5.1)
Sodium: 140 mEq/L (ref 135–145)
Total Bilirubin: 0.4 mg/dL (ref 0.2–1.2)
Total Protein: 6.2 g/dL (ref 6.0–8.3)

## 2022-02-01 LAB — LIPID PANEL
Cholesterol: 198 mg/dL (ref 0–200)
HDL: 63.7 mg/dL (ref >39.00)
Total CHOL/HDL Ratio: 3
Triglycerides: 403 mg/dL — ABNORMAL HIGH (ref 0.0–149.0)

## 2022-02-01 LAB — LDL CHOLESTEROL, DIRECT: Direct LDL: 89 mg/dL

## 2022-02-01 LAB — VITAMIN B12: Vitamin B-12: 459 pg/mL (ref 211–911)

## 2022-02-02 NOTE — Addendum Note (Signed)
Addended by: Binnie Rail on: 02/02/2022 07:45 AM   Modules accepted: Orders

## 2022-02-07 ENCOUNTER — Ambulatory Visit (INDEPENDENT_AMBULATORY_CARE_PROVIDER_SITE_OTHER): Payer: Medicare Other

## 2022-02-07 ENCOUNTER — Ambulatory Visit: Payer: Medicare Other | Admitting: Internal Medicine

## 2022-02-07 ENCOUNTER — Other Ambulatory Visit: Payer: Self-pay | Admitting: Internal Medicine

## 2022-02-07 DIAGNOSIS — E119 Type 2 diabetes mellitus without complications: Secondary | ICD-10-CM

## 2022-02-07 DIAGNOSIS — Z7901 Long term (current) use of anticoagulants: Secondary | ICD-10-CM | POA: Diagnosis not present

## 2022-02-07 DIAGNOSIS — E782 Mixed hyperlipidemia: Secondary | ICD-10-CM

## 2022-02-07 DIAGNOSIS — I4891 Unspecified atrial fibrillation: Secondary | ICD-10-CM

## 2022-02-07 DIAGNOSIS — I10 Essential (primary) hypertension: Secondary | ICD-10-CM

## 2022-02-07 LAB — COMPREHENSIVE METABOLIC PANEL
ALT: 26 U/L (ref 0–35)
AST: 21 U/L (ref 0–37)
Albumin: 3.9 g/dL (ref 3.5–5.2)
Alkaline Phosphatase: 53 U/L (ref 39–117)
BUN: 18 mg/dL (ref 6–23)
CO2: 28 mEq/L (ref 19–32)
Calcium: 9.5 mg/dL (ref 8.4–10.5)
Chloride: 107 mEq/L (ref 96–112)
Creatinine, Ser: 1.12 mg/dL (ref 0.40–1.20)
GFR: 43.6 mL/min — ABNORMAL LOW (ref >60.00)
Glucose, Bld: 119 mg/dL — ABNORMAL HIGH (ref 70–99)
Potassium: 4.1 mEq/L (ref 3.5–5.1)
Sodium: 143 mEq/L (ref 135–145)
Total Bilirubin: 0.4 mg/dL (ref 0.2–1.2)
Total Protein: 6.7 g/dL (ref 6.0–8.3)

## 2022-02-07 LAB — LIPID PANEL
Cholesterol: 198 mg/dL (ref 0–200)
HDL: 61.6 mg/dL (ref >39.00)
LDL Cholesterol: 99 mg/dL (ref 0–99)
NonHDL: 136.64
Total CHOL/HDL Ratio: 3
Triglycerides: 186 mg/dL — ABNORMAL HIGH (ref 0.0–149.0)
VLDL: 37.2 mg/dL (ref 0.0–40.0)

## 2022-02-07 LAB — BASIC METABOLIC PANEL
BUN: 18 mg/dL (ref 6–23)
CO2: 28 mEq/L (ref 19–32)
Calcium: 9.5 mg/dL (ref 8.4–10.5)
Chloride: 107 mEq/L (ref 96–112)
Creatinine, Ser: 1.12 mg/dL (ref 0.40–1.20)
GFR: 43.6 mL/min — ABNORMAL LOW (ref >60.00)
Glucose, Bld: 119 mg/dL — ABNORMAL HIGH (ref 70–99)
Potassium: 4.1 mEq/L (ref 3.5–5.1)
Sodium: 143 mEq/L (ref 135–145)

## 2022-02-07 LAB — POCT INR: INR: 2.1 (ref 2.0–3.0)

## 2022-02-07 NOTE — Progress Notes (Addendum)
Continue 1/2 tablet daily except take 1 tablet on Mondays, Thursdays and Saturdays. Recheck in 4 week.  Pt also requested lab results. She does not look at [facility] so she has not received the results. Read PCP comments and printed lab results for pt. Pt went downstairs to have lab draw to check kidney function again.

## 2022-02-07 NOTE — Patient Instructions (Addendum)
Pre visit review using our clinic review tool, if applicable. No additional management support is needed unless otherwise documented below in the visit note.  Continue 1/2 tablet daily except take 1 tablet on Mondays, Thursdays and Saturdays. Recheck in 4 week.

## 2022-02-20 ENCOUNTER — Telehealth: Payer: Self-pay

## 2022-02-20 NOTE — Telephone Encounter (Signed)
Pt daughter is calling for the results from 02/07/22.  Please call her daughter [phone]

## 2022-02-22 NOTE — Telephone Encounter (Signed)
Left message with daughter regarding results.   It also looks like Larene Beach gave patient results when they were in the office on 02/07/22.

## 2022-03-07 ENCOUNTER — Ambulatory Visit (INDEPENDENT_AMBULATORY_CARE_PROVIDER_SITE_OTHER): Payer: Medicare Other

## 2022-03-07 DIAGNOSIS — H353113 Nonexudative age-related macular degeneration, right eye, advanced atrophic without subfoveal involvement: Secondary | ICD-10-CM | POA: Diagnosis not present

## 2022-03-07 DIAGNOSIS — H353223 Exudative age-related macular degeneration, left eye, with inactive scar: Secondary | ICD-10-CM | POA: Diagnosis not present

## 2022-03-07 DIAGNOSIS — H3581 Retinal edema: Secondary | ICD-10-CM | POA: Diagnosis not present

## 2022-03-07 DIAGNOSIS — Z7901 Long term (current) use of anticoagulants: Secondary | ICD-10-CM | POA: Diagnosis not present

## 2022-03-07 DIAGNOSIS — H353211 Exudative age-related macular degeneration, right eye, with active choroidal neovascularization: Secondary | ICD-10-CM | POA: Diagnosis not present

## 2022-03-07 LAB — POCT INR: INR: 1.5 — AB (ref 2.0–3.0)

## 2022-03-07 NOTE — Patient Instructions (Signed)
Increase dose today to 1 tablet and increase dose tomorrow to 1 tablet.   Then continue 1/2 tablet daily except take 1 tablet on Mondays, Thursdays and Saturdays. Recheck in 2 weeks.

## 2022-03-07 NOTE — Progress Notes (Signed)
Pt reports that she has started recently drinking Boost high-protein nutritional supplements. Denies any other changes. INR today 1.5.  Pt instructed to increase dose today to 1 tablet and increase dose tomorrow to 1 tablet. Then continue 1/2 tablet daily except take 1 tablet on Mondays, Thursdays and Saturdays. Recheck in 2 weeks.

## 2022-03-12 IMAGING — CR DG CHEST 2V
2 series · 2 of 2 positions shown · non-contrast
Comparison: 03/26/2013

CLINICAL DATA: Cough, COVID positive.

EXAM:
CHEST - 2 VIEW

[w chest pa]
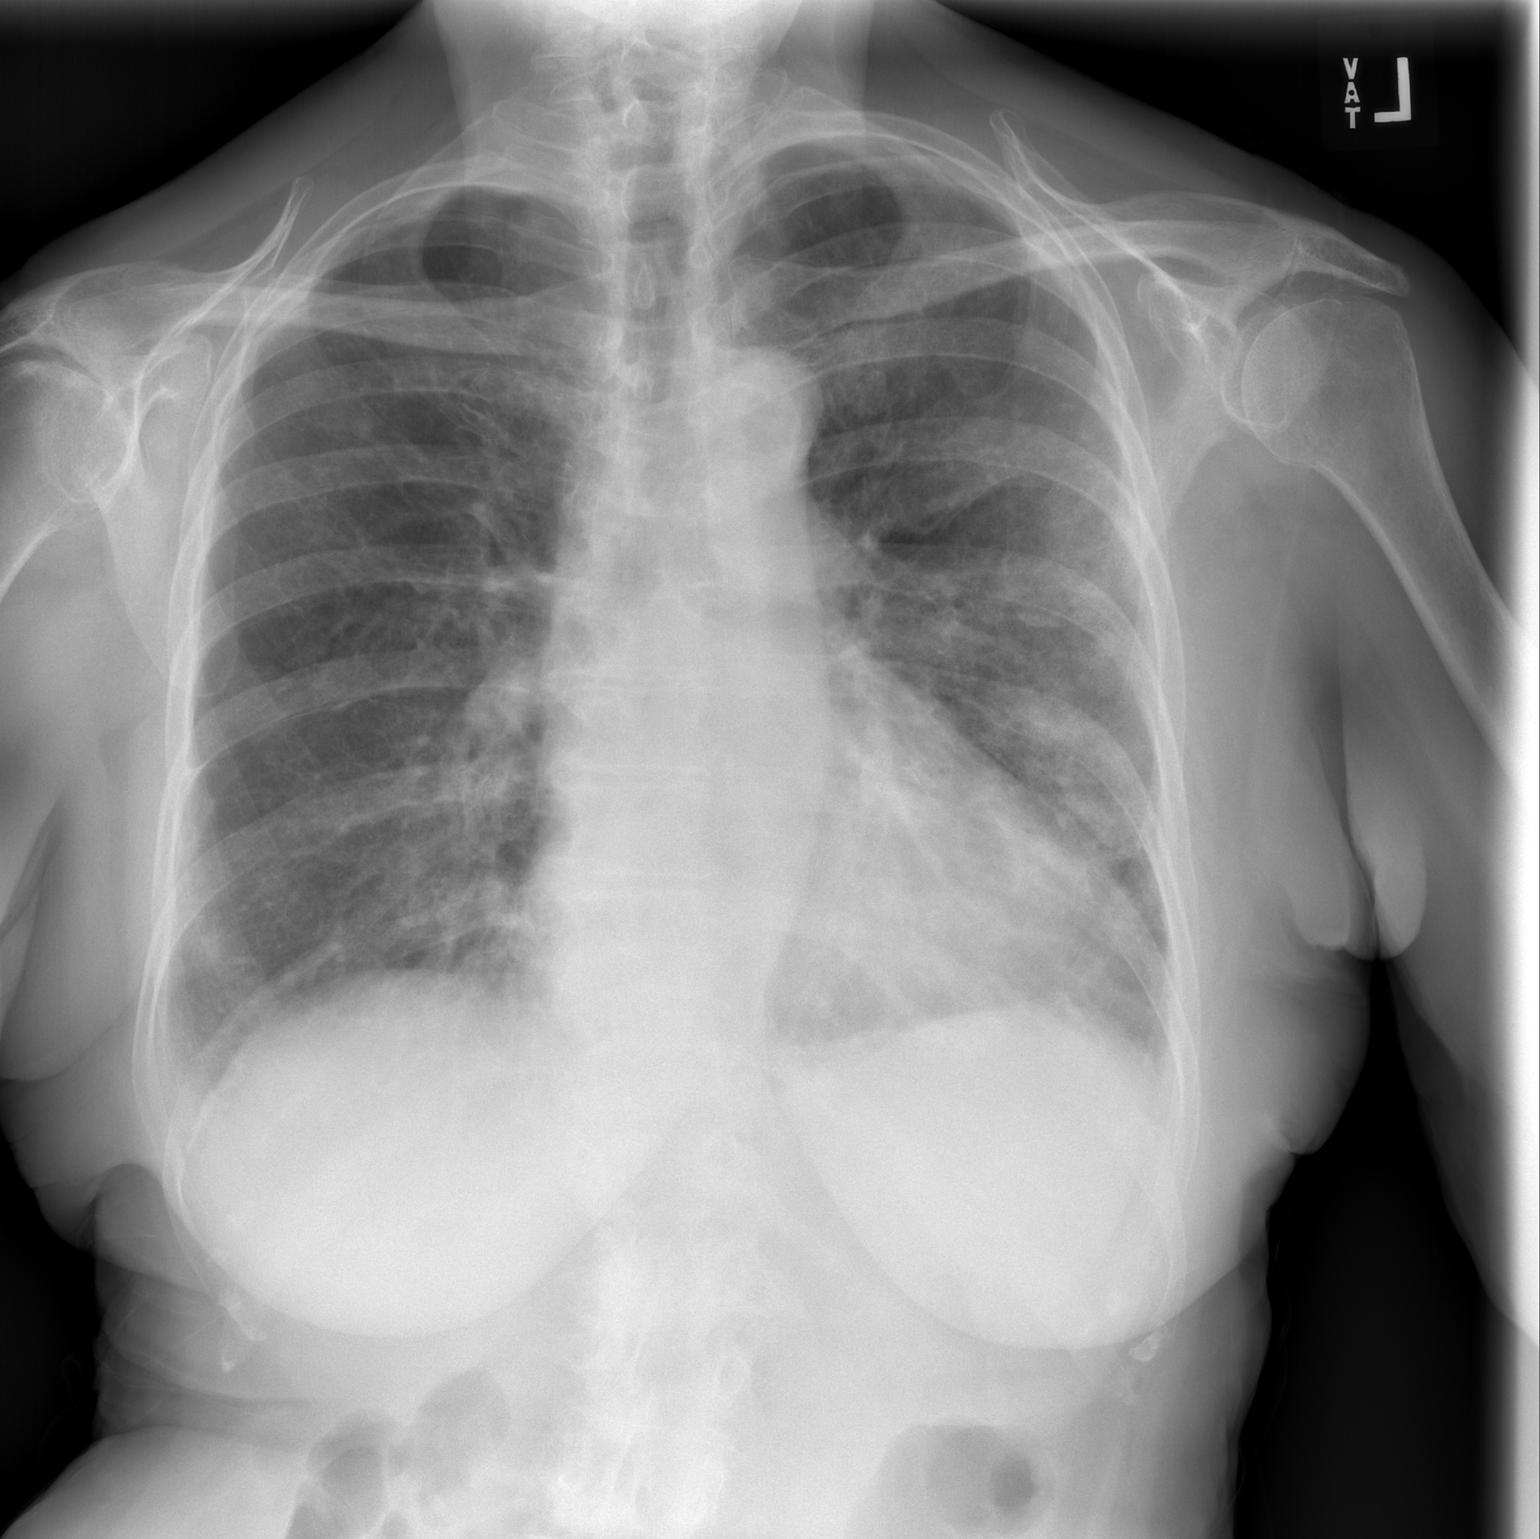

[w chest lat]
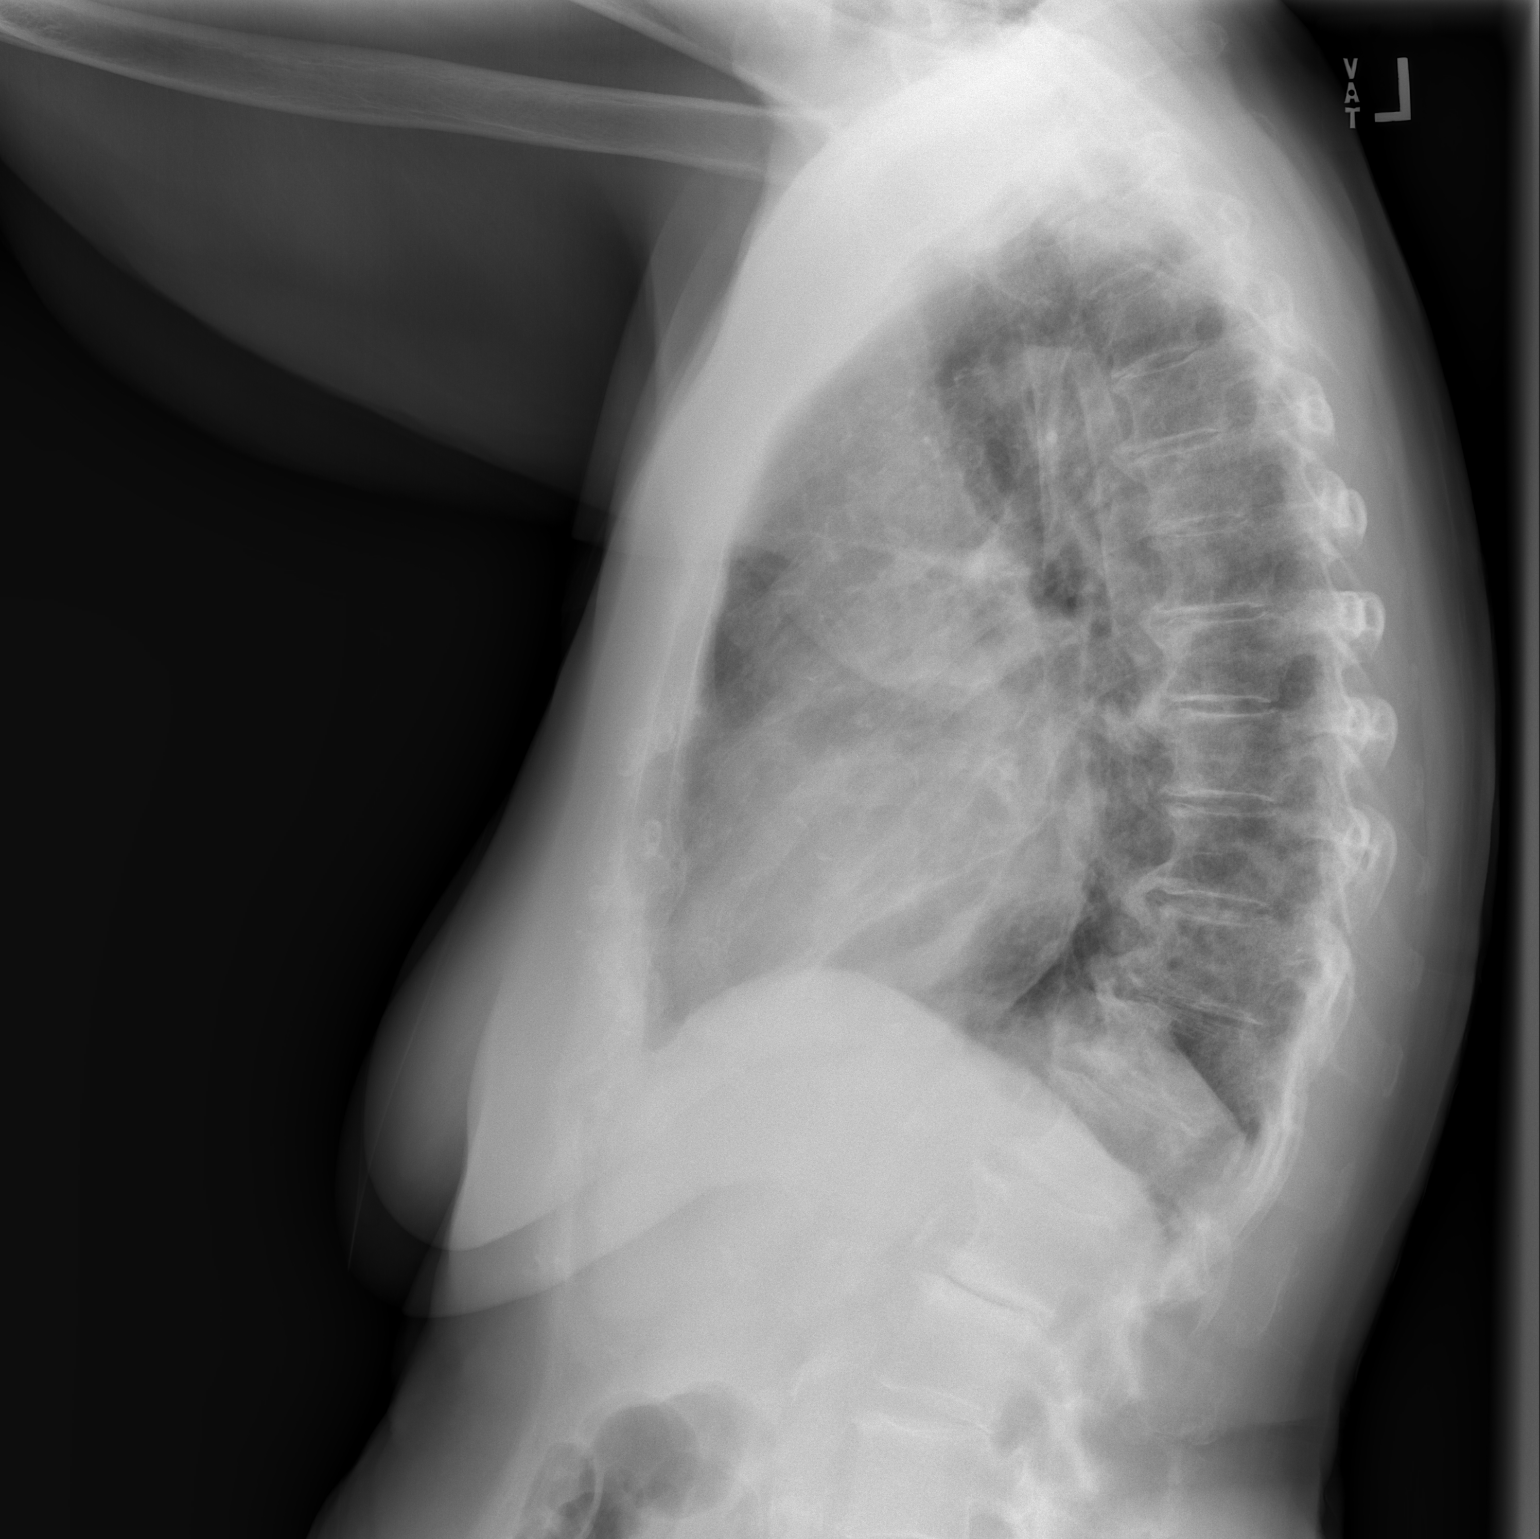

[2 of 2 positions shown; findings below may reference images not displayed]

FINDINGS: Patchy heterogeneous bilateral airspace opacities in a mid-lower
lung zone predominant distribution. Stable cardiomegaly. Unchanged
mediastinal contours. No pleural fluid or pneumothorax. Stable
osseous structures. Diffuse degenerative change in the spine.
IMPRESSION: 1. Patchy heterogeneous bilateral airspace opacities consistent with
multifocal pneumonia, pattern typical of [id number].
2. Stable cardiomegaly.

## 2022-03-21 ENCOUNTER — Ambulatory Visit (INDEPENDENT_AMBULATORY_CARE_PROVIDER_SITE_OTHER): Payer: Medicare Other

## 2022-03-21 DIAGNOSIS — Z7901 Long term (current) use of anticoagulants: Secondary | ICD-10-CM | POA: Diagnosis not present

## 2022-03-21 LAB — POCT INR: INR: 1.4 — AB (ref 2.0–3.0)

## 2022-03-21 NOTE — Progress Notes (Signed)
Pt reports that she has started recently drinking Boost high-protein nutritional supplements. Denies any other changes. INR today 1.4.  Pt instructed to increase dose today to 1 1/2 tablet and increase dose tomorrow to 1 tablet. Then change weekly dose to 1 tablet a day except take 1/2 tablet on Mondays, Wednesdays, and Fridays. Recheck in 2 weeks.

## 2022-03-21 NOTE — Patient Instructions (Signed)
increase dose today to 1 1/2 tablet and increase dose tomorrow to 1 tablet. Then change weekly dose to 1 tablet a day except take 1/2 tablet on Mondays, Wednesdays, and Fridays. Recheck in 2 weeks

## 2022-03-22 ENCOUNTER — Telehealth: Payer: Self-pay | Admitting: Internal Medicine

## 2022-03-22 NOTE — Telephone Encounter (Signed)
Spoke with patient today.  She states she took her nail polish off and it looked alittle red.  She tried a foot soak with baking soda and the foot appears to be fine.  No pain, redness or tenderness.  She will continue foot soaks and follow up if no improvement.

## 2022-03-22 NOTE — Telephone Encounter (Signed)
Patients daughter states that patient has blood under her two big toe nails.  She would like to talk to someone about this to see if maybe she should take her to a foot doctor or make appointment here.  Please advise.

## 2022-04-03 ENCOUNTER — Other Ambulatory Visit: Payer: Self-pay | Admitting: Cardiovascular Disease

## 2022-04-04 ENCOUNTER — Ambulatory Visit (INDEPENDENT_AMBULATORY_CARE_PROVIDER_SITE_OTHER): Payer: Medicare Other

## 2022-04-04 DIAGNOSIS — Z7901 Long term (current) use of anticoagulants: Secondary | ICD-10-CM

## 2022-04-04 DIAGNOSIS — Z23 Encounter for immunization: Secondary | ICD-10-CM

## 2022-04-04 LAB — POCT INR: INR: 2 (ref 2.0–3.0)

## 2022-04-04 NOTE — Progress Notes (Addendum)
Continue to take 1 tablet a day except take 1/2 tablet on Mondays, Wednesdays, and Fridays. Recheck in 4 weeks.    Fluad administered per pt request in right deltoid. Pt tolerated well.

## 2022-04-04 NOTE — Patient Instructions (Signed)
Continue to take 1 tablet a day except take 1/2 tablet on Mondays, Wednesdays, and Fridays. Recheck in 4 weeks.

## 2022-04-06 ENCOUNTER — Other Ambulatory Visit: Payer: Self-pay | Admitting: Cardiovascular Disease

## 2022-04-10 ENCOUNTER — Other Ambulatory Visit: Payer: Self-pay | Admitting: Cardiovascular Disease

## 2022-04-11 IMAGING — CR DG CHEST 2V
2 series · 2 of 2 positions shown · non-contrast
Comparison: 07/27/2020

CLINICAL DATA: History of COVID

EXAM:
CHEST - 2 VIEW

[w chest pa]
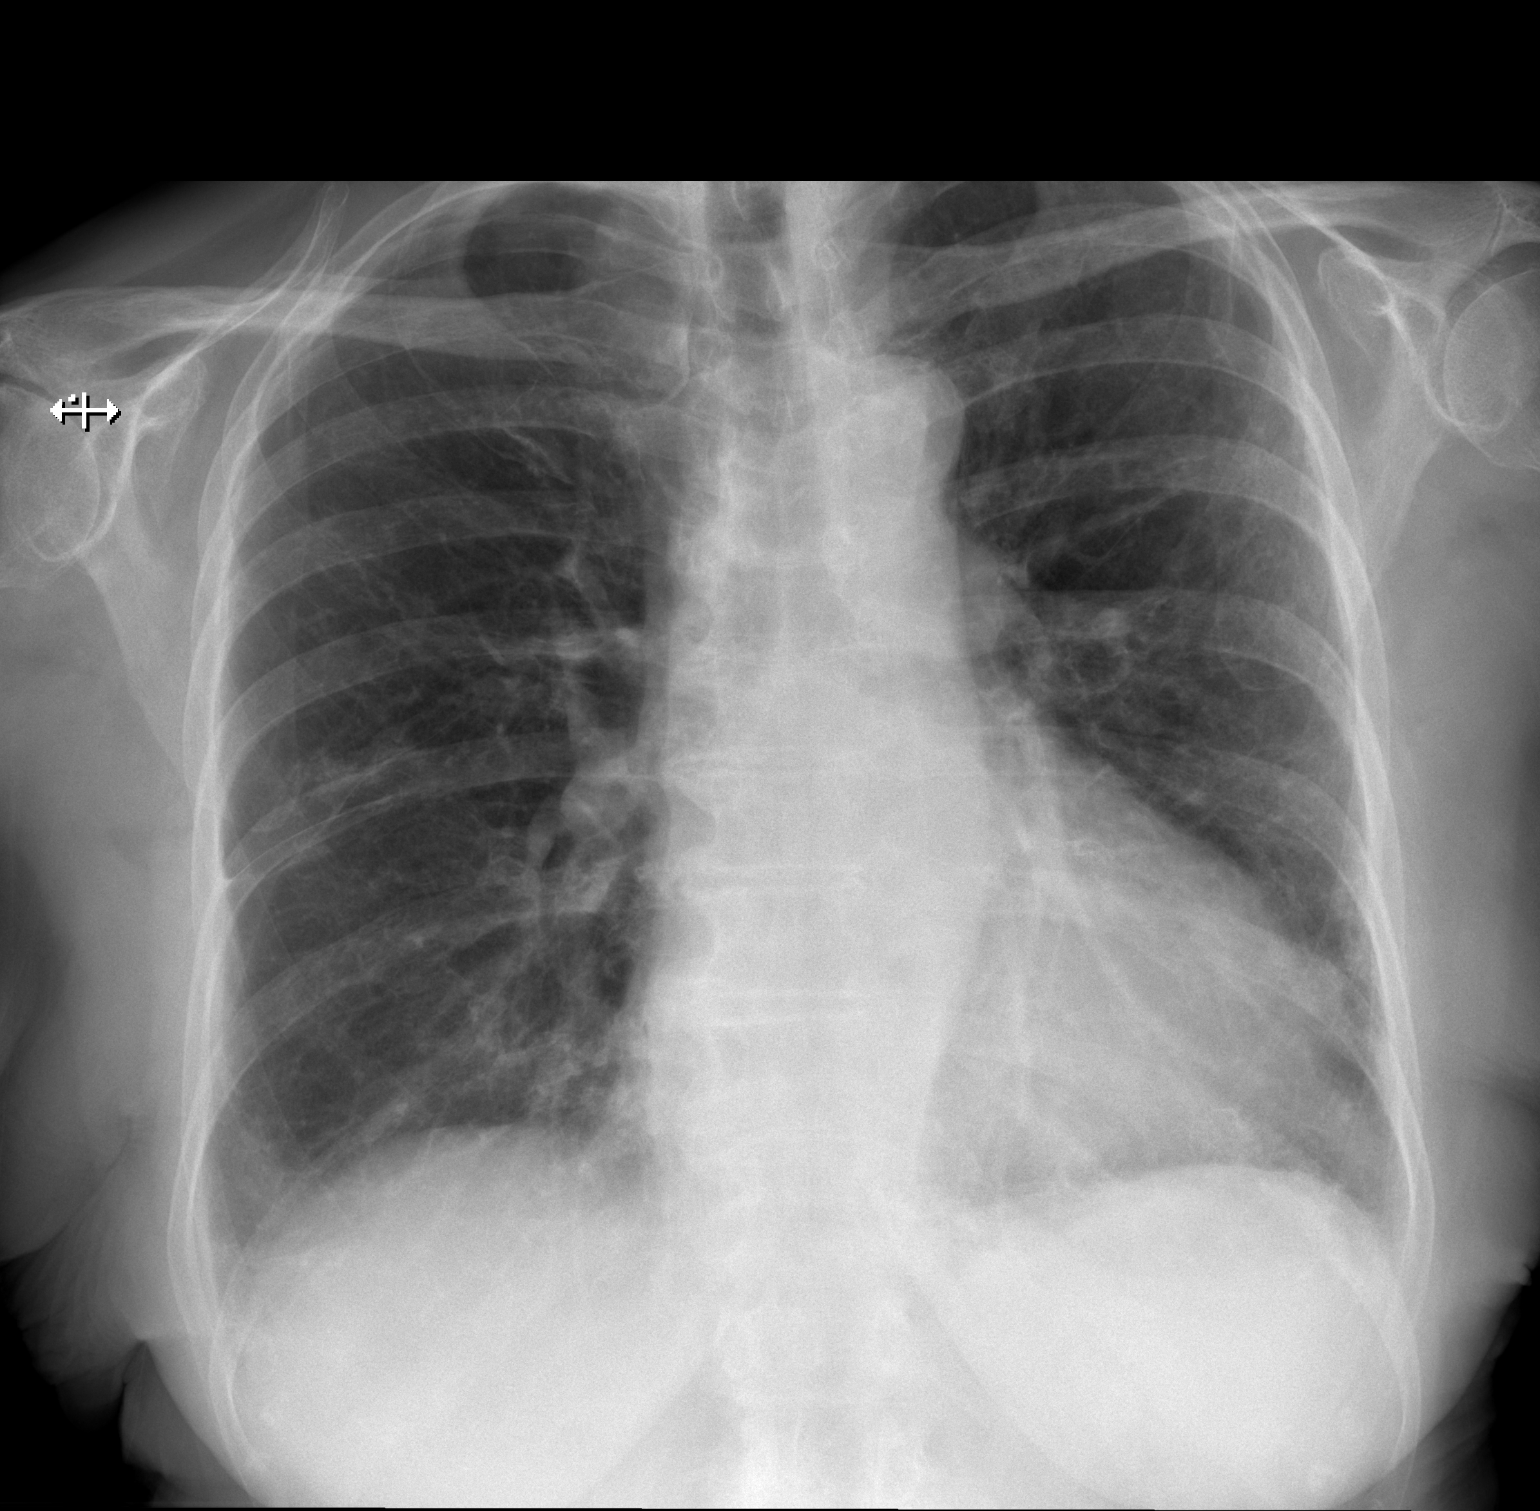

[w chest lat]
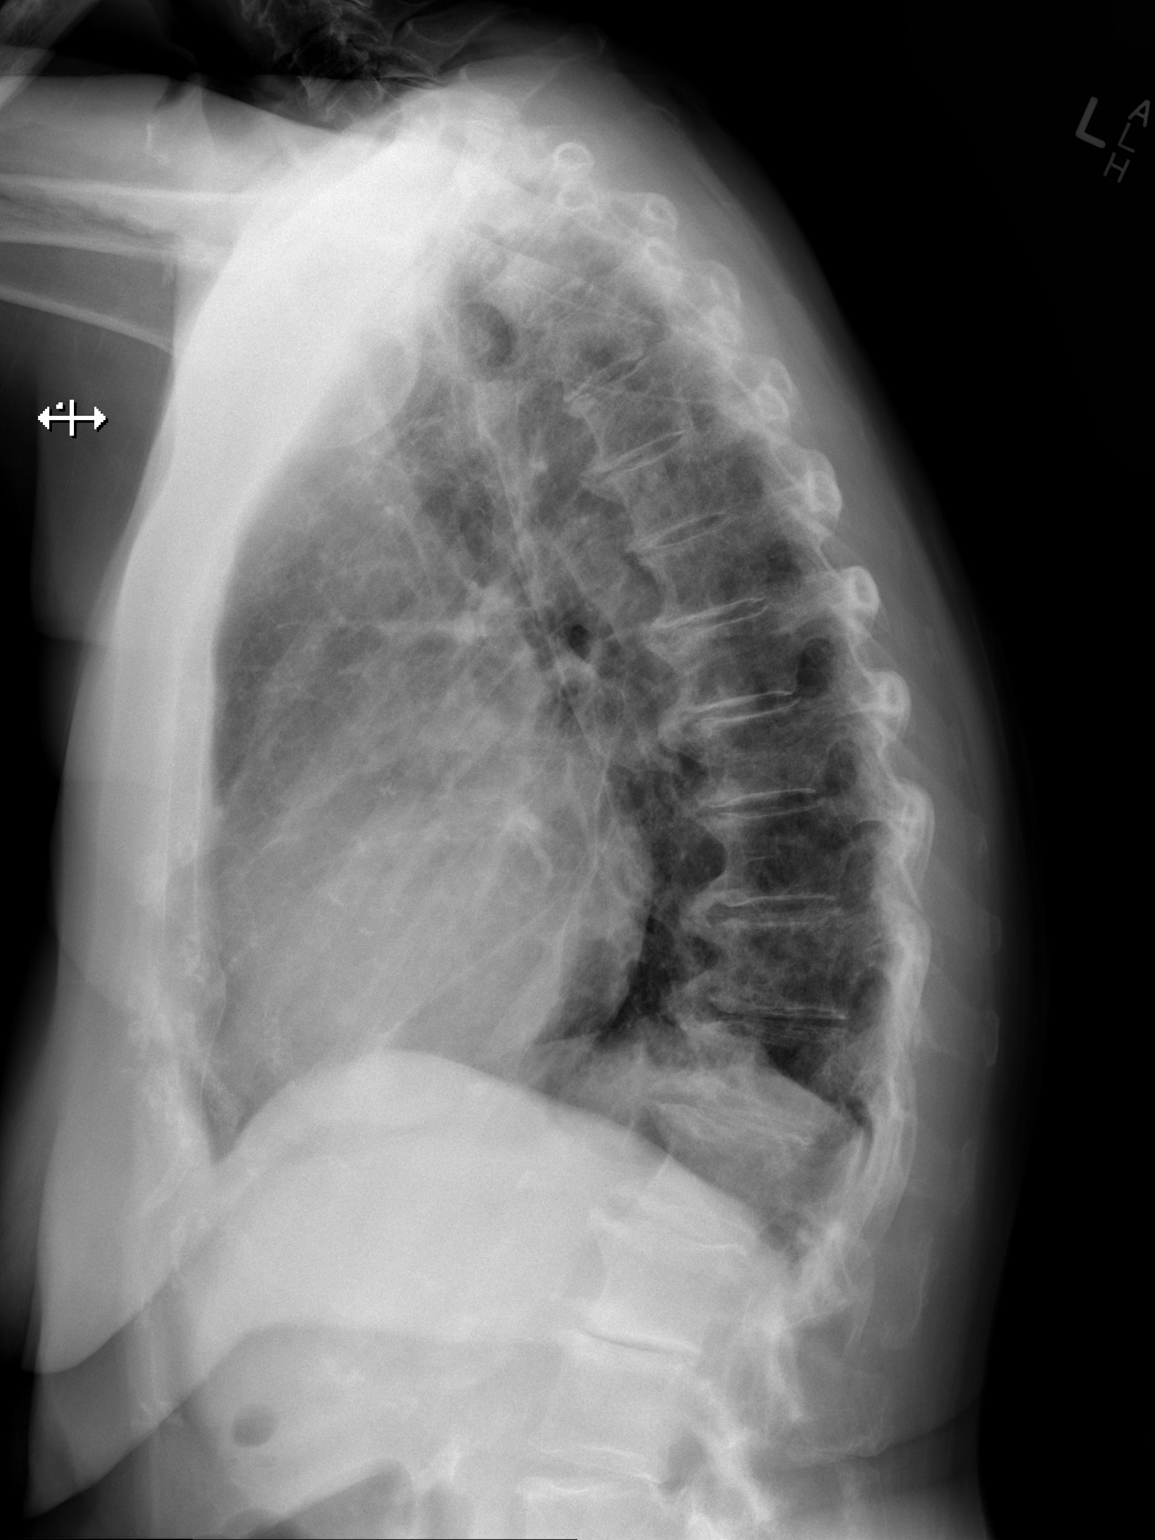

[2 of 2 positions shown; findings below may reference images not displayed]

FINDINGS: Cardiomegaly. Subtle heterogeneous airspace opacity is almost
completely resolved. Disc degenerative disease of the thoracic
spine.
IMPRESSION: 1. Subtle heterogeneous airspace opacity is almost completely
resolved, consistent with nearly resolved COVID airspace disease
given clinical history.
2. Cardiomegaly.

## 2022-04-14 NOTE — Progress Notes (Signed)
Date:  04/26/2022   ID:  Judy [name], DOB [birth year], MRN [id number]  PCP:  Binnie Rail, MD  Cardiologist:   Johnsie Cancel Electrophysiologist:  None   Evaluation Performed:  Follow-Up Visit  Chief Complaint:  CAD/AFib  History of Present Illness:    88 y.o. history of CAD. First stent RCA 1996 and PDA/OM in 2006. Distant cerebellar stroke No carotid disease. Chronic afib on coumadin. Activity limited by macular degeneration and peripheral neuropathy. Has seen primary and neurology on B12 ? From diet controlled DM as well On statin for HLD  Statin Changed to crestor due to interaction of simvastatin with cardizem    No palpitations, bleeding , chest pain Toprol dose decreased in July 2022 due to relative bradycardia   She has no cardiac  complaints    Gout of right first MCP May 2022 Rx with Colchicine Uric acid 7.1 just above normal limit of 7.0   Lost some weight with tonsillitis and COVID latter in February  Upset about primary care lack Of being proactive with Rx of COVID   Discussed looking into expense of DOAC;s again    Past Medical History:  Diagnosis Date   Atrial fibrillation ([place])    a. chronic anticoag, failed prior DCCV;  b. 03/2010 Echo: EF 55-65%, mod MR, mildly to mod dil LA, mod dil RA, mild to mod MR.   CAD (coronary artery disease)    a. BMS to RCA 96';  b. Stent PDA and OM DES 2006   CAROTID ARTERY DISEASE    a. 11/2011 Carotid U/S:  0-39% bilat dzs.   HTN (hypertension)    Hyperlipidemia    LBBB (left bundle branch block)    Macular degeneration, wet ([place]) 10/2012 dx   L>R, follows with retinal specialist->injections monthly.   Obesity    Vertigo    Past Surgical History:  Procedure Laterality Date   [place], 2006 x2   Drug-eluting stent placement to the PDA, drug-eluting stent  placement to the first obtuse  marginal, StarClose closure of the right common femoral arteriotomy site. Successful drug-eluting stent  placement in   both the posterior descending artery and the obtuse marginal. The lesion was directly stented using a 2.5 x 16 mm Taxus deployed at 14 atmospheres.    EYE SURGERY     Tear duct stent     Current Meds  Medication Sig   acetaminophen (TYLENOL) 500 MG tablet Take 500 mg by mouth every 6 (six) hours as needed.   aspirin 81 MG tablet Take 81 mg by mouth daily.   azelastine (ASTELIN) 0.1 % nasal spray Place 2 sprays into the nose 2 (two) times daily as needed for rhinitis or allergies.   b complex vitamins tablet Take 1 tablet by mouth daily.   diltiazem (CARDIZEM CD) 120 MG 24 hr capsule Take 1 capsule (120 mg total) by mouth daily.   furosemide (LASIX) 20 MG tablet TAKE 1 TABLET BY MOUTH EVERY DAY   isosorbide mononitrate (IMDUR) 30 MG 24 hr tablet TAKE 1 TABLET BY MOUTH AT BEDTIME.   lisinopril (ZESTRIL) 10 MG tablet Take 1 tablet (10 mg total) by mouth daily.   meclizine (ANTIVERT) 25 MG tablet Take 1 tablet (25 mg total) by mouth 3 (three) times daily as needed for dizziness.   metoprolol succinate (TOPROL-XL) 50 MG 24 hr tablet Take 1 tablet (50 mg total) by mouth daily. TAKE WITH OR IMMEDIATELY FOLLOWING A MEAL. Please make yearly  appointment with Dr. Johnsie Cancel for November 2023 before anymore refills. Thank you. 1st attempt   Multiple Vitamin (MULTIVITAMIN WITH MINERALS) TABS tablet Take 1 tablet by mouth daily.   nitroGLYCERIN (NITROSTAT) 0.4 MG SL tablet PLACE 1 TABLET UNDER TONGUE EVERY 5 MINUTES AS NEEDED FOR CHEST PAIN   potassium chloride (MICRO-K) 10 MEQ CR capsule TAKE 1 CAPSULE BY MOUTH EVERY DAY   rosuvastatin (CRESTOR) 10 MG tablet Take 1 tablet (10 mg total) by mouth daily. Please keep scheduled appointment for future refills. Thank you.   sodium chloride (OCEAN) 0.65 % SOLN nasal spray Place 1 spray into both nostrils as needed for congestion.   warfarin (COUMADIN) 1 MG tablet TAKE 1 TABLET BY MOUTH MONDAYS, WEDNESDAYS, FRIDAYS AND SATURDAYS OR AS DIRECTED BY ANTICOAGULATION CLINIC    warfarin (COUMADIN) 2.5 MG tablet TAKE 1 TABLET BY MOUTH DAILY EXCEPT TAKE 1/2 TABLET ON MONDAYS, WEDNESDAYS, AND FRIDAYS. TAKE AS DIRECTED BY ANTICOAGULATION CLINIC.     Allergies:   Betadine [povidone iodine], Betadine [povidone-iodine], and Methocarbamol   Social History   Tobacco Use   Smoking status: Never   Smokeless tobacco: Never  Vaping Use   Vaping Use: Never used  Substance Use Topics   Alcohol use: No   Drug use: No     Family Hx: The patient's family history includes Arthritis in her father and mother; CAD in her brother, brother, and sister.  ROS:   Please see the history of present illness.     All other systems reviewed and are negative.   Prior CV studies:   The following studies were reviewed today:  Carotid 12/18/18 plaque no stenosis   Labs/Other Tests and Data Reviewed:    EKG:  04/26/2022 0  afib old IMI IVCD nonspecific ST changes   Recent Labs: 01/31/2022: Hemoglobin 13.1; Platelets 225.0 02/07/2022: ALT 26; BUN 18; BUN 18; Creatinine, Ser 1.12; Creatinine, Ser 1.12; Potassium 4.1; Potassium 4.1; Sodium 143; Sodium 143   Recent Lipid Panel Lab Results  Component Value Date/Time   CHOL 198 02/07/2022 10:37 AM   CHOL 178 05/12/2019 09:25 AM   TRIG 186.0 (H) 02/07/2022 10:37 AM   HDL 61.60 02/07/2022 10:37 AM   HDL 47 05/12/2019 09:25 AM   CHOLHDL 3 02/07/2022 10:37 AM   LDLCALC 99 02/07/2022 10:37 AM   LDLCALC 98 02/17/2020 10:16 AM   LDLDIRECT 89.0 01/31/2022 04:29 PM    Wt Readings from Last 3 Encounters:  04/26/22 145 lb 9.6 oz (66 kg)  01/31/22 142 lb (64.4 kg)  07/19/21 148 lb 9.6 oz (67.4 kg)     Objective:    Vital Signs:  BP 126/70   Pulse 79   Ht '5\' 3"'$  (1.6 m)   Wt 145 lb 9.6 oz (66 kg)   SpO2 97%   BMI 25.79 kg/m    Affect appropriate Healthy:  appears stated age HEENT: normal Neck supple with no adenopathy JVP normal no bruits no thyromegaly Lungs clear with no wheezing and good diaphragmatic motion Heart:   S1/S2 no murmur, no rub, gallop or click PMI normal Abdomen: benighn, BS positve, no tenderness, no AAA no bruit.  No HSM or HJR Distal pulses intact with no bruits No edema Neuro non-focal Skin warm and dry No muscular weakness   ASSESSMENT & PLAN:    CAD:  Distant intervention to RCA and OM last in 2006 no angina continue medical RX given age  HLD:  Continue statin LDL 73   DM:  Diet controlled A1c  6.6 01/31/22   Afib:  Toporl decreased to 50 mg 12/28/20 INR been on low side  Recently f/u coumadin clinic will see what patient assistance is available for xarelto  Neuropathy:  On B12 ? Related to DM f/u neurology   Macular Degeneration: f/u Dr Tye Savoy has had injections and improved  COVID-19 Education: The signs and symptoms of COVID-19 were discussed with the patient and how to seek care for testing (follow up with PCP or arrange E-visit).  The importance of social distancing was discussed today.   Medication Adjustments/Labs and Tests Ordered: Current medicines are reviewed at length with the patient today.  Concerns regarding medicines are outlined above.   Tests Ordered:  None   Medication Changes:  None   Disposition:  Follow up in a year   Signed, Jenkins Rouge, MD  04/26/2022 10:08 AM    [place]

## 2022-04-19 ENCOUNTER — Other Ambulatory Visit: Payer: Self-pay | Admitting: Internal Medicine

## 2022-04-19 DIAGNOSIS — Z7901 Long term (current) use of anticoagulants: Secondary | ICD-10-CM

## 2022-04-26 ENCOUNTER — Encounter: Payer: Self-pay | Admitting: Cardiovascular Disease

## 2022-04-26 ENCOUNTER — Telehealth: Payer: Self-pay

## 2022-04-26 ENCOUNTER — Ambulatory Visit: Payer: Medicare Other | Attending: Cardiovascular Disease | Admitting: Cardiovascular Disease

## 2022-04-26 VITALS — BP 126/70 | HR 79 | Ht 63.0 in | Wt 145.6 lb

## 2022-04-26 DIAGNOSIS — I2510 Atherosclerotic heart disease of native coronary artery without angina pectoris: Secondary | ICD-10-CM | POA: Diagnosis not present

## 2022-04-26 DIAGNOSIS — I482 Chronic atrial fibrillation, unspecified: Secondary | ICD-10-CM | POA: Diagnosis not present

## 2022-04-26 DIAGNOSIS — E782 Mixed hyperlipidemia: Secondary | ICD-10-CM | POA: Diagnosis not present

## 2022-04-26 NOTE — Telephone Encounter (Signed)
Left message for patient to call back  

## 2022-04-26 NOTE — Telephone Encounter (Signed)
-----   Message from Ramond Dial, [place] sent at 04/26/2022 10:59 AM EST ----- Should be about $45 per month for half to 2/3 of the year and then ~$130/month ----- Message ----- From: Michaelyn Barter, RN Sent: 04/26/2022  10:24 AM EST To: Deliah Boston Via, LPN; [facility]  Can you check and see how much it would cost patient to go on xarelto?  Thank you,  Pam, RN

## 2022-04-26 NOTE — Patient Instructions (Signed)
Medication Instructions:  Your physician recommends that you continue on your current medications as directed. Please refer to the Current Medication list given to you today.  *If you need a refill on your cardiac medications before your next appointment, please call your pharmacy*  Lab Work: If you have labs (blood work) drawn today and your tests are completely normal, you will receive your results only by: MyChart Message (if you have MyChart) OR A paper copy in the mail If you have any lab test that is abnormal or we need to change your treatment, we will call you to review the results.  Testing/Procedures: None ordered today.  Follow-Up: At [facility], you and your health needs are our priority.  As part of our continuing mission to provide you with exceptional heart care, we have created designated Provider Care Teams.  These Care Teams include your primary Cardiologist (physician) and Advanced Practice Providers (APPs -  Physician Assistants and Nurse Practitioners) who all work together to provide you with the care you need, when you need it.  We recommend signing up for the patient portal called "MyChart".  Sign up information is provided on this After Visit Summary.  MyChart is used to connect with patients for Virtual Visits (Telemedicine).  Patients are able to view lab/test results, encounter notes, upcoming appointments, etc.  Non-urgent messages can be sent to your provider as well.   To learn more about what you can do with MyChart, go to [email or web address].    Your next appointment:   1 year(s)  The format for your next appointment:   In Person  Provider:   Peter Nishan, MD     Important Information About Sugar       

## 2022-04-29 DIAGNOSIS — J208 Acute bronchitis due to other specified organisms: Secondary | ICD-10-CM | POA: Diagnosis not present

## 2022-05-01 ENCOUNTER — Telehealth: Payer: Self-pay | Admitting: Internal Medicine

## 2022-05-01 NOTE — Telephone Encounter (Signed)
Called and spoke to pt's daughter. Appt rescheduled to 05/09/22.

## 2022-05-01 NOTE — Telephone Encounter (Signed)
Patients daughter Hillary Bow called to reschedule appointment with Corene Cornea for 05/02/22, She asked for a call back to reschedule at [phone]

## 2022-05-02 ENCOUNTER — Ambulatory Visit: Payer: Medicare Other

## 2022-05-03 DIAGNOSIS — R509 Fever, unspecified: Secondary | ICD-10-CM | POA: Diagnosis not present

## 2022-05-03 DIAGNOSIS — J208 Acute bronchitis due to other specified organisms: Secondary | ICD-10-CM | POA: Diagnosis not present

## 2022-05-03 DIAGNOSIS — R0981 Nasal congestion: Secondary | ICD-10-CM | POA: Diagnosis not present

## 2022-05-03 DIAGNOSIS — B9689 Other specified bacterial agents as the cause of diseases classified elsewhere: Secondary | ICD-10-CM | POA: Diagnosis not present

## 2022-05-09 ENCOUNTER — Other Ambulatory Visit: Payer: Self-pay | Admitting: Family

## 2022-05-09 ENCOUNTER — Other Ambulatory Visit: Payer: Self-pay

## 2022-05-09 ENCOUNTER — Ambulatory Visit (INDEPENDENT_AMBULATORY_CARE_PROVIDER_SITE_OTHER): Payer: Medicare Other

## 2022-05-09 DIAGNOSIS — Z7901 Long term (current) use of anticoagulants: Secondary | ICD-10-CM | POA: Diagnosis not present

## 2022-05-09 LAB — POCT INR: INR: 4.9 — AB (ref 2.0–3.0)

## 2022-05-09 NOTE — Progress Notes (Signed)
Erroneous encounter-disregard

## 2022-05-09 NOTE — Progress Notes (Signed)
Pt was prescribed Augmentin 875-125 mg BID x 10 days for bacterial bronchitis on 11/22. Also reports a decrease in appetite.    Hold today's dose and hold tomorrow's dose. Then change weekly dose to 1/2 tablet a day except take 1 tablet on Sundays and Wednesdays. Recheck in 2 weeks.

## 2022-05-09 NOTE — Patient Instructions (Addendum)
Hold today's dose and hold tomorrow's dose. Then change weekly dose to 1/2 tablet a day except take 1 tablet on Sundays and Wednesdays. Recheck in 2 weeks.

## 2022-05-09 NOTE — Progress Notes (Signed)
Agree with management.  Tramya Schoenfelder J Tiffaney Heimann, MD  

## 2022-05-17 ENCOUNTER — Other Ambulatory Visit: Payer: Self-pay | Admitting: Cardiovascular Disease

## 2022-05-19 DIAGNOSIS — H353223 Exudative age-related macular degeneration, left eye, with inactive scar: Secondary | ICD-10-CM | POA: Diagnosis not present

## 2022-05-19 DIAGNOSIS — H35372 Puckering of macula, left eye: Secondary | ICD-10-CM | POA: Diagnosis not present

## 2022-05-19 DIAGNOSIS — H353211 Exudative age-related macular degeneration, right eye, with active choroidal neovascularization: Secondary | ICD-10-CM | POA: Diagnosis not present

## 2022-05-19 DIAGNOSIS — H43819 Vitreous degeneration, unspecified eye: Secondary | ICD-10-CM | POA: Diagnosis not present

## 2022-05-21 ENCOUNTER — Other Ambulatory Visit: Payer: Self-pay | Admitting: Cardiovascular Disease

## 2022-05-21 DIAGNOSIS — N3000 Acute cystitis without hematuria: Secondary | ICD-10-CM | POA: Diagnosis not present

## 2022-05-22 DIAGNOSIS — N3000 Acute cystitis without hematuria: Secondary | ICD-10-CM | POA: Diagnosis not present

## 2022-05-23 ENCOUNTER — Ambulatory Visit (INDEPENDENT_AMBULATORY_CARE_PROVIDER_SITE_OTHER): Payer: Medicare Other

## 2022-05-23 DIAGNOSIS — Z7901 Long term (current) use of anticoagulants: Secondary | ICD-10-CM

## 2022-05-23 LAB — POCT INR: INR: 1.6 — AB (ref 2.0–3.0)

## 2022-05-23 NOTE — Patient Instructions (Addendum)
Pre visit review using our clinic review tool, if applicable. No additional management support is needed unless otherwise documented below in the visit note.  Change weekly dose to 1 tablet daily except take 1/2 tablet on Monday, Wednesday and Friday.  Recheck in 1 weeks.

## 2022-05-23 NOTE — Progress Notes (Cosign Needed Addendum)
Pt finished Augmentin and was prescribed nitrofurantoin x 10 days. Augmentin interacted with warfarin and pt had elevated INR at last visit due to the abx. Since INR was 4.9 due to abx at last visit, there was a dose change to bring the INR down. Pt was stable on that dose before the change and Augmentin prescription.  Due to pt being stable on prior dosing, before abx, dosing has been changed back to prior dosing. Will recheck INR in 1 week. Change weekly dose to 1 tablet daily except take 1/2 tablet on Monday, Wednesday and Friday.  Recheck in 1 weeks.   Change weekly dose to 1 tablet daily except take 1/2 tablet on Monday, Wednesday and Friday.  Recheck in 1 weeks.

## 2022-05-30 ENCOUNTER — Ambulatory Visit (INDEPENDENT_AMBULATORY_CARE_PROVIDER_SITE_OTHER): Payer: Medicare Other

## 2022-05-30 ENCOUNTER — Other Ambulatory Visit: Payer: Self-pay | Admitting: Cardiovascular Disease

## 2022-05-30 DIAGNOSIS — Z7901 Long term (current) use of anticoagulants: Secondary | ICD-10-CM

## 2022-05-30 LAB — POCT INR: INR: 1.8 — AB (ref 2.0–3.0)

## 2022-05-30 NOTE — Patient Instructions (Addendum)
Pre visit review using our clinic review tool, if applicable. No additional management support is needed unless otherwise documented below in the visit note.  Increase dose today to take 1 1/2 tablets and then change weekly dose to 1 tablet daily except take 1/2 tablet on Monday and Thursday.  Recheck in 3 weeks.

## 2022-05-30 NOTE — Progress Notes (Signed)
Pt reports she was advised by the urgent care that her urine culture came back negative and she did not need to finish the nitrofurantoin.  Increase dose today to take 1 1/2 tablets and then change weekly dose to 1 tablet daily except take 1/2 tablet on Monday and Thursday.  Recheck in 3 weeks.

## 2022-06-20 ENCOUNTER — Ambulatory Visit (INDEPENDENT_AMBULATORY_CARE_PROVIDER_SITE_OTHER): Payer: Medicare Other

## 2022-06-20 DIAGNOSIS — Z7901 Long term (current) use of anticoagulants: Secondary | ICD-10-CM | POA: Diagnosis not present

## 2022-06-20 LAB — POCT INR: INR: 2.3 (ref 2.0–3.0)

## 2022-06-20 NOTE — Patient Instructions (Addendum)
Pre visit review using our clinic review tool, if applicable. No additional management support is needed unless otherwise documented below in the visit note.  Continue 1 tablet daily except take 1/2 tablet on Monday and Thursday.  Recheck in 4 weeks.

## 2022-06-20 NOTE — Progress Notes (Signed)
Continue 1 tablet daily except take 1/2 tablet on Monday and Thursday.  Recheck in 4 weeks.

## 2022-07-01 ENCOUNTER — Other Ambulatory Visit: Payer: Self-pay | Admitting: Cardiovascular Disease

## 2022-07-06 ENCOUNTER — Other Ambulatory Visit: Payer: Self-pay | Admitting: Cardiovascular Disease

## 2022-07-08 ENCOUNTER — Other Ambulatory Visit: Payer: Self-pay | Admitting: Cardiovascular Disease

## 2022-07-18 ENCOUNTER — Ambulatory Visit (INDEPENDENT_AMBULATORY_CARE_PROVIDER_SITE_OTHER): Payer: Medicare Other

## 2022-07-18 DIAGNOSIS — Z7901 Long term (current) use of anticoagulants: Secondary | ICD-10-CM | POA: Diagnosis not present

## 2022-07-18 DIAGNOSIS — H353223 Exudative age-related macular degeneration, left eye, with inactive scar: Secondary | ICD-10-CM | POA: Diagnosis not present

## 2022-07-18 DIAGNOSIS — H04123 Dry eye syndrome of bilateral lacrimal glands: Secondary | ICD-10-CM | POA: Diagnosis not present

## 2022-07-18 DIAGNOSIS — H35372 Puckering of macula, left eye: Secondary | ICD-10-CM | POA: Diagnosis not present

## 2022-07-18 DIAGNOSIS — H43813 Vitreous degeneration, bilateral: Secondary | ICD-10-CM | POA: Diagnosis not present

## 2022-07-18 DIAGNOSIS — H353211 Exudative age-related macular degeneration, right eye, with active choroidal neovascularization: Secondary | ICD-10-CM | POA: Diagnosis not present

## 2022-07-18 LAB — POCT INR: INR: 1.9 — AB (ref 2.0–3.0)

## 2022-07-18 NOTE — Progress Notes (Signed)
Increase dose to take 1 1/2 tablets today and the continue 1 tablet daily except take 1/2 tablet on Monday and Thursday.  Recheck in 3 weeks.

## 2022-07-18 NOTE — Patient Instructions (Addendum)
Pre visit review using our clinic review tool, if applicable. No additional management support is needed unless otherwise documented below in the visit note.  Increase dose to take 1 1/2 tablets today and the continue 1 tablet daily except take 1/2 tablet on Monday and Thursday.  Recheck in 3 weeks.

## 2022-07-19 ENCOUNTER — Encounter: Payer: Self-pay | Admitting: Internal Medicine

## 2022-07-19 NOTE — Progress Notes (Unsigned)
    Subjective:    Patient ID: Judy [name], female    DOB: Oct 17, 1930, 89 y.o.   MRN: [id number]      HPI Judy [name] is here for No chief complaint on file.   Pain in side x several weeks  -      Medications and allergies reviewed with patient and updated if appropriate.  Current Outpatient Medications on File Prior to Visit  Medication Sig Dispense Refill   acetaminophen (TYLENOL) 500 MG tablet Take 500 mg by mouth every 6 (six) hours as needed.     aspirin 81 MG tablet Take 81 mg by mouth daily.     azelastine (ASTELIN) 0.1 % nasal spray Place 2 sprays into the nose 2 (two) times daily as needed for rhinitis or allergies.     b complex vitamins tablet Take 1 tablet by mouth daily.     diltiazem (CARDIZEM CD) 120 MG 24 hr capsule Take 1 capsule (120 mg total) by mouth daily. 90 capsule 1   furosemide (LASIX) 20 MG tablet TAKE 1 TABLET BY MOUTH EVERY DAY 90 tablet 3   isosorbide mononitrate (IMDUR) 30 MG 24 hr tablet TAKE 1 TABLET BY MOUTH EVERYDAY AT BEDTIME 90 tablet 3   lisinopril (ZESTRIL) 10 MG tablet TAKE 1 TABLET BY MOUTH EVERY DAY 90 tablet 3   meclizine (ANTIVERT) 25 MG tablet Take 1 tablet (25 mg total) by mouth 3 (three) times daily as needed for dizziness. 30 tablet 5   metoprolol succinate (TOPROL-XL) 50 MG 24 hr tablet Take 1 tablet (50 mg total) by mouth daily. TAKE WITH OR IMMEDIATELY FOLLOWING A MEAL 90 tablet 1   Multiple Vitamin (MULTIVITAMIN WITH MINERALS) TABS tablet Take 1 tablet by mouth daily.     nitroGLYCERIN (NITROSTAT) 0.4 MG SL tablet PLACE 1 TABLET UNDER TONGUE EVERY 5 MINUTES AS NEEDED FOR CHEST PAIN 25 tablet 8   potassium chloride (MICRO-K) 10 MEQ CR capsule TAKE 1 CAPSULE BY MOUTH EVERY DAY 90 capsule 3   rosuvastatin (CRESTOR) 10 MG tablet TAKE 1 TABLET (10 MG TOTAL) BY MOUTH DAILY. PLEASE KEEP SCHEDULED APPOINTMENT FOR FUTURE REFILLS. THANK YOU. 90 tablet 3   sodium chloride (OCEAN) 0.65 % SOLN nasal spray Place 1 spray into both nostrils as  needed for congestion.     warfarin (COUMADIN) 1 MG tablet TAKE 1 TABLET BY MOUTH MONDAYS, WEDNESDAYS, FRIDAYS AND SATURDAYS OR AS DIRECTED BY ANTICOAGULATION CLINIC 30 tablet 1   warfarin (COUMADIN) 2.5 MG tablet TAKE 1 TABLET BY MOUTH DAILY EXCEPT TAKE 1/2 TABLET ON MONDAYS, WEDNESDAYS, AND FRIDAYS. TAKE AS DIRECTED BY ANTICOAGULATION CLINIC. 60 tablet 1   No current facility-administered medications on file prior to visit.    Review of Systems     Objective:  There were no vitals filed for this visit. BP Readings from Last 3 Encounters:  04/26/22 126/70  01/31/22 130/70  07/19/21 132/72   Wt Readings from Last 3 Encounters:  04/26/22 145 lb 9.6 oz (66 kg)  01/31/22 142 lb (64.4 kg)  07/19/21 148 lb 9.6 oz (67.4 kg)   There is no height or weight on file to calculate BMI.    Physical Exam         Assessment & Plan:    See Problem List for Assessment and Plan of chronic medical problems.

## 2022-07-19 NOTE — Patient Instructions (Signed)
      Blood work was ordered.   The lab is on the first floor.    Medications changes include :       A referral was ordered for XXX.     Someone will call you to schedule an appointment.    No follow-ups on file.

## 2022-07-20 ENCOUNTER — Ambulatory Visit (INDEPENDENT_AMBULATORY_CARE_PROVIDER_SITE_OTHER): Payer: Medicare Other | Admitting: Internal Medicine

## 2022-07-20 ENCOUNTER — Telehealth: Payer: Self-pay | Admitting: Internal Medicine

## 2022-07-20 VITALS — BP 110/74 | HR 88 | Temp 98.1°F | Ht 63.0 in | Wt 135.0 lb

## 2022-07-20 DIAGNOSIS — M545 Low back pain, unspecified: Secondary | ICD-10-CM | POA: Diagnosis not present

## 2022-07-20 MED ORDER — PREDNISONE 10 MG PO TABS: 10.0000 mg | ORAL_TABLET | Freq: Two times a day (BID) | ORAL | 0 refills | Status: AC

## 2022-07-20 NOTE — Assessment & Plan Note (Addendum)
Acute Left lower back - worse with movements Urine dip here negative for infection Pain sounds musculoskeletal Will hold off on x-ray since there is no injury and there is no tenderness on exam She has seen orthopedics for this in the past and they also thought was musculoskeletal-likely secondary to degenerative changes Can continue Tylenol, ice, heat Try gentle stretching, avoid being too sedentary during the day Prednisone 10 mg twice daily x 5 days Call if no improvement

## 2022-07-20 NOTE — Telephone Encounter (Signed)
Left message for Judy [name].  Appointment today was an acute visit only. The appointment that was cancelled was for her 6 month follow up and needs to be rescheduled since it was cancelled.  If she calls back please reschedule new appointment.

## 2022-07-20 NOTE — Telephone Encounter (Signed)
Patient daughter called in reference to the lab appointment and and office visit that was scheduled for patient for next week. Stated since patient was seen today how long should they wait until she should be seen again since she was just put on a certain medication and not sure how long that's going to take to get out her system. The appointments have been canceled but she would like a call to discuss when to come back. Best callback number is [phone].

## 2022-07-25 ENCOUNTER — Other Ambulatory Visit: Payer: Medicare Other

## 2022-08-01 ENCOUNTER — Ambulatory Visit: Payer: Medicare Other | Admitting: Internal Medicine

## 2022-08-08 ENCOUNTER — Other Ambulatory Visit (INDEPENDENT_AMBULATORY_CARE_PROVIDER_SITE_OTHER): Payer: Medicare Other

## 2022-08-08 ENCOUNTER — Ambulatory Visit (INDEPENDENT_AMBULATORY_CARE_PROVIDER_SITE_OTHER): Payer: Medicare Other

## 2022-08-08 DIAGNOSIS — I4891 Unspecified atrial fibrillation: Secondary | ICD-10-CM | POA: Diagnosis not present

## 2022-08-08 DIAGNOSIS — Z7901 Long term (current) use of anticoagulants: Secondary | ICD-10-CM | POA: Diagnosis not present

## 2022-08-08 DIAGNOSIS — I10 Essential (primary) hypertension: Secondary | ICD-10-CM | POA: Diagnosis not present

## 2022-08-08 DIAGNOSIS — E782 Mixed hyperlipidemia: Secondary | ICD-10-CM | POA: Diagnosis not present

## 2022-08-08 DIAGNOSIS — E119 Type 2 diabetes mellitus without complications: Secondary | ICD-10-CM | POA: Diagnosis not present

## 2022-08-08 LAB — CBC WITH DIFFERENTIAL/PLATELET
Basophils Absolute: 0 10*3/uL (ref 0.0–0.1)
Basophils Relative: 0.6 % (ref 0.0–3.0)
Eosinophils Absolute: 0.2 10*3/uL (ref 0.0–0.7)
Eosinophils Relative: 2.6 % (ref 0.0–5.0)
HCT: 38 % (ref 36.0–46.0)
Hemoglobin: 12.6 g/dL (ref 12.0–15.0)
Lymphocytes Relative: 25.1 % (ref 12.0–46.0)
Lymphs Abs: 2.1 10*3/uL (ref 0.7–4.0)
MCHC: 33.1 g/dL (ref 30.0–36.0)
MCV: 88.4 fl (ref 78.0–100.0)
Monocytes Absolute: 0.6 10*3/uL (ref 0.1–1.0)
Monocytes Relative: 7 % (ref 3.0–12.0)
Neutro Abs: 5.5 10*3/uL (ref 1.4–7.7)
Neutrophils Relative %: 64.7 % (ref 43.0–77.0)
Platelets: 230 10*3/uL (ref 150.0–400.0)
RBC: 4.29 Mil/uL (ref 3.87–5.11)
RDW: 14.6 % (ref 11.5–15.5)
WBC: 8.5 10*3/uL (ref 4.0–10.5)

## 2022-08-08 LAB — COMPREHENSIVE METABOLIC PANEL
ALT: 11 U/L (ref 0–35)
AST: 17 U/L (ref 0–37)
Albumin: 4.1 g/dL (ref 3.5–5.2)
Alkaline Phosphatase: 80 U/L (ref 39–117)
BUN: 14 mg/dL (ref 6–23)
CO2: 30 mEq/L (ref 19–32)
Calcium: 10 mg/dL (ref 8.4–10.5)
Chloride: 107 mEq/L (ref 96–112)
Creatinine, Ser: 1.06 mg/dL (ref 0.40–1.20)
GFR: 46.41 mL/min — ABNORMAL LOW (ref >60.00)
Glucose, Bld: 93 mg/dL (ref 70–99)
Potassium: 4.5 mEq/L (ref 3.5–5.1)
Sodium: 145 mEq/L (ref 135–145)
Total Bilirubin: 0.4 mg/dL (ref 0.2–1.2)
Total Protein: 7 g/dL (ref 6.0–8.3)

## 2022-08-08 LAB — LIPID PANEL
Cholesterol: 191 mg/dL (ref 0–200)
HDL: 61 mg/dL (ref >39.00)
LDL Cholesterol: 97 mg/dL (ref 0–99)
NonHDL: 129.69
Total CHOL/HDL Ratio: 3
Triglycerides: 165 mg/dL — ABNORMAL HIGH (ref 0.0–149.0)
VLDL: 33 mg/dL (ref 0.0–40.0)

## 2022-08-08 LAB — POCT INR: INR: 2.9 (ref 2.0–3.0)

## 2022-08-08 LAB — HEMOGLOBIN A1C: Hgb A1c MFr Bld: 6 % (ref 4.6–6.5)

## 2022-08-08 NOTE — Patient Instructions (Addendum)
Pre visit review using our clinic review tool, if applicable. No additional management support is needed unless otherwise documented below in the visit note.  Hold dose today and the continue 1 tablet daily except take 1/2 tablet on Monday and Thursday.  Recheck in 2 weeks.

## 2022-08-08 NOTE — Progress Notes (Cosign Needed Addendum)
Pt reports increased edema over the last 2 weeks in lower legs. Advised pt to bring to PCP's attention at apt next week. Advised if any worsening to contact the office. Pt reports she is eating very little greens.  Hold dose today and the continue 1 tablet daily except take 1/2 tablet on Monday and Thursday.  Recheck in 2 weeks.

## 2022-08-14 ENCOUNTER — Encounter: Payer: Self-pay | Admitting: Internal Medicine

## 2022-08-14 NOTE — Patient Instructions (Addendum)
       Medications changes include :   None    A carotid ultrasound was ordered     Return in about 6 months (around 02/15/2023) for follow up.

## 2022-08-14 NOTE — Progress Notes (Signed)
Subjective:    Patient ID: Judy VERDELL, female    DOB: 04-Oct-1930, 89 y.o.   MRN: [id number]     HPI Judy [name] is here for follow up of her chronic medical problems, including htn, DM, hld, B12 def, h/o R cerebellar cva, Afib, polyneuropathy  Was here one month ago with left lower back pain - prednisone low dose x 5 days.  The prednisone worked and she no longer has back pain.  Leg swelling in the left ankle only -- has bad OA in the knee.    Medications and allergies reviewed with patient and updated if appropriate.  Current Outpatient Medications on File Prior to Visit  Medication Sig Dispense Refill   acetaminophen (TYLENOL) 500 MG tablet Take 500 mg by mouth every 6 (six) hours as needed.     aspirin 81 MG tablet Take 81 mg by mouth daily.     azelastine (ASTELIN) 0.1 % nasal spray Place 2 sprays into the nose 2 (two) times daily as needed for rhinitis or allergies.     b complex vitamins tablet Take 1 tablet by mouth daily.     diltiazem (CARDIZEM CD) 120 MG 24 hr capsule Take 1 capsule (120 mg total) by mouth daily. 90 capsule 1   furosemide (LASIX) 20 MG tablet TAKE 1 TABLET BY MOUTH EVERY DAY 90 tablet 3   isosorbide mononitrate (IMDUR) 30 MG 24 hr tablet TAKE 1 TABLET BY MOUTH EVERYDAY AT BEDTIME 90 tablet 3   lisinopril (ZESTRIL) 10 MG tablet TAKE 1 TABLET BY MOUTH EVERY DAY 90 tablet 3   meclizine (ANTIVERT) 25 MG tablet Take 1 tablet (25 mg total) by mouth 3 (three) times daily as needed for dizziness. 30 tablet 5   metoprolol succinate (TOPROL-XL) 50 MG 24 hr tablet Take 1 tablet (50 mg total) by mouth daily. TAKE WITH OR IMMEDIATELY FOLLOWING A MEAL 90 tablet 1   Multiple Vitamin (MULTIVITAMIN WITH MINERALS) TABS tablet Take 1 tablet by mouth daily.     nitroGLYCERIN (NITROSTAT) 0.4 MG SL tablet PLACE 1 TABLET UNDER TONGUE EVERY 5 MINUTES AS NEEDED FOR CHEST PAIN 25 tablet 8   potassium chloride (MICRO-K) 10 MEQ CR capsule TAKE 1 CAPSULE BY MOUTH EVERY DAY 90  capsule 3   rosuvastatin (CRESTOR) 10 MG tablet TAKE 1 TABLET (10 MG TOTAL) BY MOUTH DAILY. PLEASE KEEP SCHEDULED APPOINTMENT FOR FUTURE REFILLS. THANK YOU. 90 tablet 3   sodium chloride (OCEAN) 0.65 % SOLN nasal spray Place 1 spray into both nostrils as needed for congestion.     tobramycin (TOBREX) 0.3 % ophthalmic solution PLEASE SEE ATTACHED FOR DETAILED DIRECTIONS     warfarin (COUMADIN) 1 MG tablet TAKE 1 TABLET BY MOUTH MONDAYS, WEDNESDAYS, FRIDAYS AND SATURDAYS OR AS DIRECTED BY ANTICOAGULATION CLINIC 30 tablet 1   warfarin (COUMADIN) 2.5 MG tablet TAKE 1 TABLET BY MOUTH DAILY EXCEPT TAKE 1/2 TABLET ON MONDAYS, WEDNESDAYS, AND FRIDAYS. TAKE AS DIRECTED BY ANTICOAGULATION CLINIC. 60 tablet 1   No current facility-administered medications on file prior to visit.     Review of Systems  Constitutional:  Negative for fever.  Respiratory:  Negative for cough, shortness of breath and wheezing.   Cardiovascular:  Positive for palpitations (occ can feel afib) and leg swelling (left ankle only). Negative for chest pain.  Neurological:  Negative for light-headedness and headaches.       Objective:   Vitals:   08/15/22 0925  BP: 112/70  Pulse: 61  Temp:  98.1 F (36.7 C)  SpO2: 97%   BP Readings from Last 3 Encounters:  08/15/22 112/70  07/20/22 110/74  04/26/22 126/70   Wt Readings from Last 3 Encounters:  08/15/22 144 lb (65.3 kg)  07/20/22 135 lb (61.2 kg)  04/26/22 145 lb 9.6 oz (66 kg)   Body mass index is 25.51 kg/m.    Physical Exam Constitutional:      General: She is not in acute distress.    Appearance: Normal appearance.  HENT:     Head: Normocephalic and atraumatic.  Eyes:     Conjunctiva/sclera: Conjunctivae normal.  Cardiovascular:     Rate and Rhythm: Normal rate and regular rhythm.     Heart sounds: Normal heart sounds.  Pulmonary:     Effort: Pulmonary effort is normal. No respiratory distress.     Breath sounds: Normal breath sounds. No wheezing.   Musculoskeletal:     Cervical back: Neck supple.     Right lower leg: No edema.     Left lower leg: No edema.  Lymphadenopathy:     Cervical: No cervical adenopathy.  Skin:    General: Skin is warm and dry.     Findings: No rash.  Neurological:     Mental Status: She is alert. Mental status is at baseline.  Psychiatric:        Mood and Affect: Mood normal.        Behavior: Behavior normal.        Lab Results  Component Value Date   WBC 8.5 08/08/2022   HGB 12.6 08/08/2022   HCT 38.0 08/08/2022   PLT 230.0 08/08/2022   GLUCOSE 93 08/08/2022   CHOL 191 08/08/2022   TRIG 165.0 (H) 08/08/2022   HDL 61.00 08/08/2022   LDLDIRECT 89.0 01/31/2022   LDLCALC 97 08/08/2022   ALT 11 08/08/2022   AST 17 08/08/2022   NA 145 08/08/2022   K 4.5 08/08/2022   CL 107 08/08/2022   CREATININE 1.06 08/08/2022   BUN 14 08/08/2022   CO2 30 08/08/2022   TSH 2.29 02/17/2020   INR 2.9 08/08/2022   HGBA1C 6.0 08/08/2022     Assessment & Plan:    See Problem List for Assessment and Plan of chronic medical problems.

## 2022-08-15 ENCOUNTER — Ambulatory Visit (INDEPENDENT_AMBULATORY_CARE_PROVIDER_SITE_OTHER): Payer: Medicare Other | Admitting: Internal Medicine

## 2022-08-15 VITALS — BP 112/70 | HR 61 | Temp 98.1°F | Ht 63.0 in | Wt 144.0 lb

## 2022-08-15 DIAGNOSIS — E782 Mixed hyperlipidemia: Secondary | ICD-10-CM

## 2022-08-15 DIAGNOSIS — Z8673 Personal history of transient ischemic attack (TIA), and cerebral infarction without residual deficits: Secondary | ICD-10-CM | POA: Diagnosis not present

## 2022-08-15 DIAGNOSIS — I6523 Occlusion and stenosis of bilateral carotid arteries: Secondary | ICD-10-CM | POA: Diagnosis not present

## 2022-08-15 DIAGNOSIS — E538 Deficiency of other specified B group vitamins: Secondary | ICD-10-CM | POA: Diagnosis not present

## 2022-08-15 DIAGNOSIS — G6289 Other specified polyneuropathies: Secondary | ICD-10-CM

## 2022-08-15 DIAGNOSIS — I4891 Unspecified atrial fibrillation: Secondary | ICD-10-CM | POA: Diagnosis not present

## 2022-08-15 DIAGNOSIS — I10 Essential (primary) hypertension: Secondary | ICD-10-CM | POA: Diagnosis not present

## 2022-08-15 DIAGNOSIS — M545 Low back pain, unspecified: Secondary | ICD-10-CM | POA: Diagnosis not present

## 2022-08-15 DIAGNOSIS — E119 Type 2 diabetes mellitus without complications: Secondary | ICD-10-CM

## 2022-08-15 NOTE — Assessment & Plan Note (Signed)
Chronic Blood pressure well controlled CMP Continue metoprolol XL 50 mg daily, lisinopril 10 mg daily, isosorbide mononitrate 30 mg daily, diltiazem 120 mg daily

## 2022-08-15 NOTE — Assessment & Plan Note (Signed)
Chronic B12 6 months ago in the normal range Continue B12 supplementation

## 2022-08-15 NOTE — Assessment & Plan Note (Signed)
Chronic-history of stroke Continue warfarin, aspirin 81 mg daily, Crestor 10 mg daily Blood pressure well-controlled

## 2022-08-15 NOTE — Assessment & Plan Note (Signed)
Chronic   Lab Results  Component Value Date   HGBA1C 6.0 08/08/2022   Sugars well controlled Continue diet control Stressed regular exercise, diabetic diet

## 2022-08-15 NOTE — Assessment & Plan Note (Signed)
Back pain is resolved Prednisone 10 mg twice daily for 5 days successfully resolved pain

## 2022-08-15 NOTE — Assessment & Plan Note (Signed)
Chronic Likely diabetic or idiopathic Not currently on medication

## 2022-08-15 NOTE — Assessment & Plan Note (Signed)
Chronic Due for carotid US - ordred Continue ASA, crestor

## 2022-08-15 NOTE — Assessment & Plan Note (Addendum)
Chronic Regular exercise and healthy diet encouraged LDL little higher than ideal, but given her age I will continue the statin at her current dose Continue Crestor 10 mg daily

## 2022-08-15 NOTE — Assessment & Plan Note (Signed)
Chronic Following with cardiology On diltiazem 120 mg daily, metoprolol XL 50 mg daily and warfarin CBC, CMP reviewed

## 2022-08-22 ENCOUNTER — Ambulatory Visit (INDEPENDENT_AMBULATORY_CARE_PROVIDER_SITE_OTHER): Payer: Medicare Other

## 2022-08-22 DIAGNOSIS — Z7901 Long term (current) use of anticoagulants: Secondary | ICD-10-CM | POA: Diagnosis not present

## 2022-08-22 LAB — POCT INR: INR: 2.1 (ref 2.0–3.0)

## 2022-08-22 NOTE — Patient Instructions (Addendum)
Pre visit review using our clinic review tool, if applicable. No additional management support is needed unless otherwise documented below in the visit note.  Continue 1 tablet daily except take 1/2 tablet on Monday and Thursday.  Recheck in 4 weeks.  

## 2022-08-22 NOTE — Progress Notes (Signed)
Continue 1 tablet daily except take 1/2 tablet on Monday and Thursday.  Recheck in 4 weeks.  

## 2022-09-03 ENCOUNTER — Other Ambulatory Visit: Payer: Self-pay | Admitting: Internal Medicine

## 2022-09-03 ENCOUNTER — Other Ambulatory Visit: Payer: Self-pay | Admitting: Cardiovascular Disease

## 2022-09-03 DIAGNOSIS — Z7901 Long term (current) use of anticoagulants: Secondary | ICD-10-CM

## 2022-09-04 NOTE — Telephone Encounter (Signed)
Pt is compliant with warfarin management and PCP apts.  Sent in refill of warfarin to requested pharmacy.      

## 2022-09-06 ENCOUNTER — Ambulatory Visit (HOSPITAL_COMMUNITY): Payer: Medicare Other

## 2022-09-12 DIAGNOSIS — H43813 Vitreous degeneration, bilateral: Secondary | ICD-10-CM | POA: Diagnosis not present

## 2022-09-12 DIAGNOSIS — H353211 Exudative age-related macular degeneration, right eye, with active choroidal neovascularization: Secondary | ICD-10-CM | POA: Diagnosis not present

## 2022-09-12 DIAGNOSIS — H35372 Puckering of macula, left eye: Secondary | ICD-10-CM | POA: Diagnosis not present

## 2022-09-12 DIAGNOSIS — H353223 Exudative age-related macular degeneration, left eye, with inactive scar: Secondary | ICD-10-CM | POA: Diagnosis not present

## 2022-09-12 DIAGNOSIS — H04123 Dry eye syndrome of bilateral lacrimal glands: Secondary | ICD-10-CM | POA: Diagnosis not present

## 2022-09-19 ENCOUNTER — Ambulatory Visit (INDEPENDENT_AMBULATORY_CARE_PROVIDER_SITE_OTHER): Payer: Medicare Other

## 2022-09-19 DIAGNOSIS — Z7901 Long term (current) use of anticoagulants: Secondary | ICD-10-CM | POA: Diagnosis not present

## 2022-09-19 LAB — POCT INR: INR: 2.6 (ref 2.0–3.0)

## 2022-09-19 NOTE — Patient Instructions (Addendum)
Pre visit review using our clinic review tool, if applicable. No additional management support is needed unless otherwise documented below in the visit note.  Reduce dose today to take 1/2 tablet and the continue 1 tablet daily except take 1/2 tablet on Monday and Thursday.  Recheck in 3 weeks.

## 2022-09-19 NOTE — Progress Notes (Addendum)
Reduce dose today to take 1/2 tablet and the continue 1 tablet daily except take 1/2 tablet on Monday and Thursday.  Recheck in 3 weeks.

## 2022-09-21 ENCOUNTER — Ambulatory Visit (HOSPITAL_COMMUNITY)
Admission: RE | Admit: 2022-09-21 | Discharge: 2022-09-21 | Disposition: A | Discharge status: Home / Self Care | Payer: Medicare Other | Source: Ambulatory Visit | Attending: Cardiology | Admitting: Cardiology

## 2022-09-21 DIAGNOSIS — I6523 Occlusion and stenosis of bilateral carotid arteries: Secondary | ICD-10-CM

## 2022-10-04 ENCOUNTER — Other Ambulatory Visit: Payer: Self-pay | Admitting: Internal Medicine

## 2022-10-04 DIAGNOSIS — Z7901 Long term (current) use of anticoagulants: Secondary | ICD-10-CM

## 2022-10-04 NOTE — Telephone Encounter (Signed)
Pt is compliant with warfarin management and PCP apts.  Sent in refill of warfarin to requested pharmacy.      

## 2022-10-10 ENCOUNTER — Ambulatory Visit (INDEPENDENT_AMBULATORY_CARE_PROVIDER_SITE_OTHER): Payer: Medicare Other

## 2022-10-10 DIAGNOSIS — Z7901 Long term (current) use of anticoagulants: Secondary | ICD-10-CM

## 2022-10-10 LAB — POCT INR: INR: 2.6 (ref 2.0–3.0)

## 2022-10-10 NOTE — Patient Instructions (Addendum)
Pre visit review using our clinic review tool, if applicable. No additional management support is needed unless otherwise documented below in the visit note.  Eat a salad today and then change weekly dose to take 1 tablet daily except take 1/2 tablet on Mondays, Wednesdays, and Fridays.  Recheck in 3 weeks.

## 2022-10-10 NOTE — Progress Notes (Signed)
Eat a salad today and then change weekly dose to take 1 tablet daily except take 1/2 tablet on Mondays, Wednesdays, and Fridays.  Recheck in 3 weeks.

## 2022-10-12 ENCOUNTER — Telehealth: Payer: Self-pay | Admitting: Radiology

## 2022-10-12 NOTE — Telephone Encounter (Signed)
Contacted Judy [name] to schedule their annual wellness visit.  Left message for patient to call back at [phone] to schedule Medicare Annual Wellness Visit    Last AWV:  05/02/21   Please schedule if Pt returns call.   Correne Lalani K. CMA

## 2022-10-24 ENCOUNTER — Ambulatory Visit (HOSPITAL_COMMUNITY): Payer: Medicare Other | Admitting: Internal Medicine

## 2022-10-31 ENCOUNTER — Ambulatory Visit (INDEPENDENT_AMBULATORY_CARE_PROVIDER_SITE_OTHER): Payer: Medicare Other

## 2022-10-31 DIAGNOSIS — Z7901 Long term (current) use of anticoagulants: Secondary | ICD-10-CM | POA: Diagnosis not present

## 2022-10-31 LAB — POCT INR: INR: 1.6 — AB (ref 2.0–3.0)

## 2022-10-31 NOTE — Progress Notes (Addendum)
Pt reports diet changes; she reports she is not consistent with intake of vitamin K containing foods. Educated pt on importance of consistency with these foods. Pt verbalized understanding.   Increase dose today to take 3.5 mg and then change weekly dose to take 2.5 mg daily except take 1.25 mg on Mondays and Fridays and take 2 mg on Wednesdays. Recheck in 3 weeks.

## 2022-10-31 NOTE — Patient Instructions (Addendum)
Pre visit review using our clinic review tool, if applicable. No additional management support is needed unless otherwise documented below in the visit note.  Increase dose today to take 3.5 mg and then change weekly dose to take 2.5 mg daily except take 1.25 mg on Mondays and Fridays and take 2 mg on Wednesdays. Recheck in 3 weeks.

## 2022-11-14 DIAGNOSIS — H35372 Puckering of macula, left eye: Secondary | ICD-10-CM | POA: Diagnosis not present

## 2022-11-14 DIAGNOSIS — H43813 Vitreous degeneration, bilateral: Secondary | ICD-10-CM | POA: Diagnosis not present

## 2022-11-14 DIAGNOSIS — H353223 Exudative age-related macular degeneration, left eye, with inactive scar: Secondary | ICD-10-CM | POA: Diagnosis not present

## 2022-11-14 DIAGNOSIS — H353211 Exudative age-related macular degeneration, right eye, with active choroidal neovascularization: Secondary | ICD-10-CM | POA: Diagnosis not present

## 2022-11-14 DIAGNOSIS — H04123 Dry eye syndrome of bilateral lacrimal glands: Secondary | ICD-10-CM | POA: Diagnosis not present

## 2022-11-21 ENCOUNTER — Ambulatory Visit (INDEPENDENT_AMBULATORY_CARE_PROVIDER_SITE_OTHER): Payer: Medicare Other

## 2022-11-21 DIAGNOSIS — Z7901 Long term (current) use of anticoagulants: Secondary | ICD-10-CM | POA: Diagnosis not present

## 2022-11-21 LAB — POCT INR: INR: 2.1 (ref 2.0–3.0)

## 2022-11-21 NOTE — Patient Instructions (Addendum)
Pre visit review using our clinic review tool, if applicable. No additional management support is needed unless otherwise documented below in the visit note.  Continue 2.5 mg daily except take 1.25 mg on Mondays and Fridays and take 2 mg on Wednesdays. Recheck in 3 weeks.

## 2022-11-21 NOTE — Progress Notes (Signed)
Continue 2.5 mg daily except take 1.25 mg on Mondays and Fridays and take 2 mg on Wednesdays. Recheck in 3 weeks.

## 2022-11-28 DIAGNOSIS — M1712 Unilateral primary osteoarthritis, left knee: Secondary | ICD-10-CM | POA: Diagnosis not present

## 2022-12-02 ENCOUNTER — Other Ambulatory Visit: Payer: Self-pay | Admitting: Cardiovascular Disease

## 2022-12-12 ENCOUNTER — Ambulatory Visit (INDEPENDENT_AMBULATORY_CARE_PROVIDER_SITE_OTHER): Payer: Medicare Other

## 2022-12-12 DIAGNOSIS — Z7901 Long term (current) use of anticoagulants: Secondary | ICD-10-CM

## 2022-12-12 LAB — POCT INR: INR: 2.9 (ref 2.0–3.0)

## 2022-12-12 NOTE — Patient Instructions (Addendum)
Pre visit review using our clinic review tool, if applicable. No additional management support is needed unless otherwise documented below in the visit note.  Hold dose today and the continue 2.5 mg daily except take 1.25 mg on Mondays and Fridays and take 2 mg on Wednesdays. Recheck in 2 weeks.
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
## 2022-12-26 ENCOUNTER — Ambulatory Visit: Payer: Medicare Other

## 2022-12-26 DIAGNOSIS — Z7901 Long term (current) use of anticoagulants: Secondary | ICD-10-CM | POA: Diagnosis not present

## 2022-12-26 LAB — POCT INR: INR: 2 (ref 2.0–3.0)

## 2022-12-26 NOTE — Patient Instructions (Addendum)
Pre visit review using our clinic review tool, if applicable. No additional management support is needed unless otherwise documented below in the visit note.  Continue 2.5 mg daily except take 1.25 mg on Mondays and Fridays and take 2 mg on Wednesdays. Recheck in 4 weeks.

## 2022-12-26 NOTE — Progress Notes (Signed)
Continue 2.5 mg daily except take 1.25 mg on Mondays and Fridays and take 2 mg on Wednesdays. Recheck in 4 weeks.

## 2022-12-30 ENCOUNTER — Other Ambulatory Visit: Payer: Self-pay | Admitting: Internal Medicine

## 2022-12-30 DIAGNOSIS — Z7901 Long term (current) use of anticoagulants: Secondary | ICD-10-CM

## 2023-01-01 NOTE — Telephone Encounter (Signed)
Pt is compliant with warfarin management and PCP apts.  Sent in refill of warfarin to requested pharmacy.      

## 2023-01-10 ENCOUNTER — Encounter (INDEPENDENT_AMBULATORY_CARE_PROVIDER_SITE_OTHER): Payer: Self-pay

## 2023-01-23 ENCOUNTER — Ambulatory Visit (INDEPENDENT_AMBULATORY_CARE_PROVIDER_SITE_OTHER): Payer: Medicare Other

## 2023-01-23 DIAGNOSIS — H43813 Vitreous degeneration, bilateral: Secondary | ICD-10-CM | POA: Diagnosis not present

## 2023-01-23 DIAGNOSIS — Z7901 Long term (current) use of anticoagulants: Secondary | ICD-10-CM | POA: Diagnosis not present

## 2023-01-23 DIAGNOSIS — H35372 Puckering of macula, left eye: Secondary | ICD-10-CM | POA: Diagnosis not present

## 2023-01-23 DIAGNOSIS — H353223 Exudative age-related macular degeneration, left eye, with inactive scar: Secondary | ICD-10-CM | POA: Diagnosis not present

## 2023-01-23 DIAGNOSIS — H353211 Exudative age-related macular degeneration, right eye, with active choroidal neovascularization: Secondary | ICD-10-CM | POA: Diagnosis not present

## 2023-01-23 DIAGNOSIS — H04123 Dry eye syndrome of bilateral lacrimal glands: Secondary | ICD-10-CM | POA: Diagnosis not present

## 2023-01-23 LAB — POCT INR: INR: 1.9 — AB (ref 2.0–3.0)

## 2023-01-23 NOTE — Patient Instructions (Addendum)
Pre visit review using our clinic review tool, if applicable. No additional management support is needed unless otherwise documented below in the visit note.  Increase dose today to take 3.5 mg and then continue 2.5 mg daily except take 1.25 mg on Mondays and Fridays and take 2 mg on Wednesdays. Recheck in 4 weeks.

## 2023-01-23 NOTE — Progress Notes (Signed)
Increase dose today to take 3.5 mg and then continue 2.5 mg daily except take 1.25 mg on Mondays and Fridays and take 2 mg on Wednesdays. Recheck in 4 weeks.

## 2023-01-24 ENCOUNTER — Ambulatory Visit: Payer: Medicare Other | Admitting: Cardiology

## 2023-01-25 ENCOUNTER — Encounter (INDEPENDENT_AMBULATORY_CARE_PROVIDER_SITE_OTHER): Payer: Self-pay

## 2023-02-12 ENCOUNTER — Encounter: Payer: Self-pay | Admitting: Internal Medicine

## 2023-02-12 DIAGNOSIS — N183 Chronic kidney disease, stage 3 unspecified: Secondary | ICD-10-CM | POA: Insufficient documentation

## 2023-02-12 DIAGNOSIS — N1832 Chronic kidney disease, stage 3b: Secondary | ICD-10-CM | POA: Insufficient documentation

## 2023-02-12 NOTE — Progress Notes (Unsigned)
Subjective:    Patient ID: Judy [name], female    DOB: 10-Jun-1931, 89 y.o.   MRN: [id number]     HPI Judy [name] is here for follow up of her chronic medical problems.    Medications and allergies reviewed with patient and updated if appropriate.  Current Outpatient Medications on File Prior to Visit  Medication Sig Dispense Refill  . acetaminophen (TYLENOL) 500 MG tablet Take 500 mg by mouth every 6 (six) hours as needed.    Marland Kitchen aspirin 81 MG tablet Take 81 mg by mouth daily.    Marland Kitchen azelastine (ASTELIN) 0.1 % nasal spray Place 2 sprays into the nose 2 (two) times daily as needed for rhinitis or allergies.    Marland Kitchen b complex vitamins tablet Take 1 tablet by mouth daily.    . isosorbide mononitrate (IMDUR) 30 MG 24 hr tablet TAKE 1 TABLET BY MOUTH EVERYDAY AT BEDTIME 90 tablet 3  . lisinopril (ZESTRIL) 10 MG tablet TAKE 1 TABLET BY MOUTH EVERY DAY 90 tablet 3  . metoprolol succinate (TOPROL-XL) 50 MG 24 hr tablet TAKE 1 TABLET BY MOUTH DAILY. TAKE WITH OR IMMEDIATELY FOLLOWING A MEAL. 90 tablet 1  . Multiple Vitamin (MULTIVITAMIN WITH MINERALS) TABS tablet Take 1 tablet by mouth daily.    . nitroGLYCERIN (NITROSTAT) 0.4 MG SL tablet PLACE 1 TABLET UNDER TONGUE EVERY 5 MINUTES AS NEEDED FOR CHEST PAIN 25 tablet 8  . potassium chloride (MICRO-K) 10 MEQ CR capsule TAKE 1 CAPSULE BY MOUTH EVERY DAY 90 capsule 3  . rosuvastatin (CRESTOR) 10 MG tablet TAKE 1 TABLET (10 MG TOTAL) BY MOUTH DAILY. PLEASE KEEP SCHEDULED APPOINTMENT FOR FUTURE REFILLS. THANK YOU. 90 tablet 3  . sodium chloride (OCEAN) 0.65 % SOLN nasal spray Place 1 spray into both nostrils as needed for congestion.    Marland Kitchen tobramycin (TOBREX) 0.3 % ophthalmic solution PLEASE SEE ATTACHED FOR DETAILED DIRECTIONS    . warfarin (COUMADIN) 1 MG tablet TAKE 1 TABLET BY MOUTH MONDAYS, WEDNESDAYS, FRIDAYS AND SATURDAYS OR AS DIRECTED BY ANTICOAGULATION CLINIC. 65 tablet 1  . warfarin (COUMADIN) 2.5 MG tablet TAKE 1 TABLET BY MOUTH DAILY  EXCEPT TAKE 1/2 TABLET ON MONDAYS AND FRIDAYS. TAKE AS DIRECTED BY ANTICOAGULATION CLINIC. TAKE 2 MG ON WEDNESDAYS USING 1 MG TALBETS 90 tablet 1   No current facility-administered medications on file prior to visit.     Review of Systems     Objective:  There were no vitals filed for this visit. BP Readings from Last 3 Encounters:  02/19/23 110/64  08/15/22 112/70  07/20/22 110/74   Wt Readings from Last 3 Encounters:  02/19/23 141 lb (64 kg)  08/15/22 144 lb (65.3 kg)  07/20/22 135 lb (61.2 kg)   There is no height or weight on file to calculate BMI.    Physical Exam     Lab Results  Component Value Date   WBC 6.3 02/20/2023   HGB 11.6 (L) 02/20/2023   HCT 35.7 (L) 02/20/2023   PLT 236.0 02/20/2023   GLUCOSE 116 (H) 02/20/2023   CHOL 159 02/20/2023   TRIG 202.0 (H) 02/20/2023   HDL 58.20 02/20/2023   LDLDIRECT 89.0 01/31/2022   LDLCALC 60 02/20/2023   ALT 12 02/20/2023   AST 19 02/20/2023   NA 143 02/20/2023   K 3.4 (L) 02/20/2023   CL 107 02/20/2023   CREATININE 0.96 02/20/2023   BUN 14 02/20/2023   CO2 29 02/20/2023   TSH 1.62 02/20/2023  INR 1.9 (A) 02/20/2023   HGBA1C 6.3 02/20/2023     Assessment & Plan:    See Problem List for Assessment and Plan of chronic medical problems.    This encounter was created in error - please disregard.

## 2023-02-12 NOTE — Assessment & Plan Note (Signed)
Chronic With CAD, hyperlipidemia  Lab Results  Component Value Date   HGBA1C 6.0 08/08/2022   Sugars well controlled Continue diet control Stressed regular exercise, diabetic diet

## 2023-02-12 NOTE — Patient Instructions (Addendum)
      Blood work was ordered.   The lab is on the first floor.    Medications changes include :       A referral was ordered and someone will call you to schedule an appointment.     Return in about 6 months (around 08/13/2023) for follow up.

## 2023-02-13 ENCOUNTER — Encounter: Payer: Medicare Other | Admitting: Internal Medicine

## 2023-02-13 DIAGNOSIS — I4891 Unspecified atrial fibrillation: Secondary | ICD-10-CM

## 2023-02-13 DIAGNOSIS — I2510 Atherosclerotic heart disease of native coronary artery without angina pectoris: Secondary | ICD-10-CM

## 2023-02-13 DIAGNOSIS — E782 Mixed hyperlipidemia: Secondary | ICD-10-CM

## 2023-02-13 DIAGNOSIS — N1831 Chronic kidney disease, stage 3a: Secondary | ICD-10-CM

## 2023-02-13 DIAGNOSIS — I6523 Occlusion and stenosis of bilateral carotid arteries: Secondary | ICD-10-CM

## 2023-02-13 DIAGNOSIS — E1169 Type 2 diabetes mellitus with other specified complication: Secondary | ICD-10-CM

## 2023-02-13 DIAGNOSIS — E538 Deficiency of other specified B group vitamins: Secondary | ICD-10-CM

## 2023-02-13 DIAGNOSIS — I10 Essential (primary) hypertension: Secondary | ICD-10-CM

## 2023-02-13 DIAGNOSIS — G6289 Other specified polyneuropathies: Secondary | ICD-10-CM

## 2023-02-13 DIAGNOSIS — Z8673 Personal history of transient ischemic attack (TIA), and cerebral infarction without residual deficits: Secondary | ICD-10-CM

## 2023-02-13 NOTE — Assessment & Plan Note (Signed)
Chronic Check B12 level Continue B12 supplementation 

## 2023-02-13 NOTE — Assessment & Plan Note (Signed)
Chronic Stable CBC, CMP

## 2023-02-13 NOTE — Assessment & Plan Note (Signed)
Chronic Likely diabetic or idiopathic Not currently on medication

## 2023-02-13 NOTE — Assessment & Plan Note (Signed)
Chronic Following with cardiology No chest pain, shortness of breath On metoprolol, statin CBC, CMP, lipid panel

## 2023-02-13 NOTE — Assessment & Plan Note (Signed)
Chronic Regular exercise and healthy diet encouraged Check lipid panel Lab Results  Component Value Date   LDLCALC 97 08/08/2022   LDL higher than ideal, but given age will continue current dose of statin Continue Crestor 10 mg daily

## 2023-02-13 NOTE — Assessment & Plan Note (Signed)
Chronic Following with cardiology On diltiazem 120 mg daily, metoprolol XL 50 mg daily and warfarin CBC, CMP, TSH

## 2023-02-13 NOTE — Assessment & Plan Note (Signed)
Chronic-history of stroke Continue warfarin, aspirin 81 mg daily, Crestor 10 mg daily Blood pressure well-controlled Sugars controlled

## 2023-02-13 NOTE — Assessment & Plan Note (Signed)
Chronic Mild bilaterally Continue ASA, warfarin, crestor 10 mg daily

## 2023-02-13 NOTE — Assessment & Plan Note (Signed)
Chronic Blood pressure well controlled CMP Continue metoprolol XL 50 mg daily, lisinopril 10 mg daily, isosorbide mononitrate 30 mg daily, diltiazem 120 mg daily

## 2023-02-18 ENCOUNTER — Encounter: Payer: Self-pay | Admitting: Internal Medicine

## 2023-02-18 NOTE — Patient Instructions (Addendum)
      Blood work was ordered.   The lab is on the first floor.    Medications changes include :   can decrease furosemide to every other day      Return in about 6 months (around 08/19/2023) for follow up.

## 2023-02-18 NOTE — Progress Notes (Unsigned)
Subjective:    Patient ID: Judy [name], female    DOB: Apr 26, 1931, 89 y.o.   MRN: [id number]     HPI [name] is here for follow up of her chronic medical problems.  Here with her daughter.  No concerns.  Feels like she is dehydrated at times.    Medications and allergies reviewed with patient and updated if appropriate.  Current Outpatient Medications on File Prior to Visit  Medication Sig Dispense Refill   acetaminophen (TYLENOL) 500 MG tablet Take 500 mg by mouth every 6 (six) hours as needed.     aspirin 81 MG tablet Take 81 mg by mouth daily.     azelastine (ASTELIN) 0.1 % nasal spray Place 2 sprays into the nose 2 (two) times daily as needed for rhinitis or allergies.     b complex vitamins tablet Take 1 tablet by mouth daily.     diltiazem (CARDIZEM CD) 120 MG 24 hr capsule TAKE 1 CAPSULE BY MOUTH EVERY DAY 90 capsule 1   furosemide (LASIX) 20 MG tablet TAKE 1 TABLET BY MOUTH EVERY DAY 90 tablet 3   isosorbide mononitrate (IMDUR) 30 MG 24 hr tablet TAKE 1 TABLET BY MOUTH EVERYDAY AT BEDTIME 90 tablet 3   lisinopril (ZESTRIL) 10 MG tablet TAKE 1 TABLET BY MOUTH EVERY DAY 90 tablet 3   metoprolol succinate (TOPROL-XL) 50 MG 24 hr tablet TAKE 1 TABLET BY MOUTH DAILY. TAKE WITH OR IMMEDIATELY FOLLOWING A MEAL. 90 tablet 1   Multiple Vitamin (MULTIVITAMIN WITH MINERALS) TABS tablet Take 1 tablet by mouth daily.     nitroGLYCERIN (NITROSTAT) 0.4 MG SL tablet PLACE 1 TABLET UNDER TONGUE EVERY 5 MINUTES AS NEEDED FOR CHEST PAIN 25 tablet 8   potassium chloride (MICRO-K) 10 MEQ CR capsule TAKE 1 CAPSULE BY MOUTH EVERY DAY 90 capsule 3   rosuvastatin (CRESTOR) 10 MG tablet TAKE 1 TABLET (10 MG TOTAL) BY MOUTH DAILY. PLEASE KEEP SCHEDULED APPOINTMENT FOR FUTURE REFILLS. THANK YOU. 90 tablet 3   sodium chloride (OCEAN) 0.65 % SOLN nasal spray Place 1 spray into both nostrils as needed for congestion.     tobramycin (TOBREX) 0.3 % ophthalmic solution PLEASE SEE ATTACHED FOR  DETAILED DIRECTIONS     warfarin (COUMADIN) 1 MG tablet TAKE 1 TABLET BY MOUTH MONDAYS, WEDNESDAYS, FRIDAYS AND SATURDAYS OR AS DIRECTED BY ANTICOAGULATION CLINIC. 65 tablet 1   warfarin (COUMADIN) 2.5 MG tablet TAKE 1 TABLET BY MOUTH DAILY EXCEPT TAKE 1/2 TABLET ON MONDAYS AND FRIDAYS. TAKE AS DIRECTED BY ANTICOAGULATION CLINIC. TAKE 2 MG ON WEDNESDAYS USING 1 MG TALBETS 90 tablet 1   No current facility-administered medications on file prior to visit.     Review of Systems  Constitutional:  Negative for fever.  Respiratory:  Positive for cough (allergy related) and shortness of breath (sometimes with exertion). Negative for wheezing.   Cardiovascular:  Negative for chest pain, palpitations and leg swelling.  Musculoskeletal:  Positive for arthralgias (b/l knees).  Neurological:  Negative for light-headedness and headaches.       Objective:   Vitals:   02/19/23 1307  BP: 110/64  Pulse: 64  Temp: 97.9 F (36.6 C)  SpO2: 97%   BP Readings from Last 3 Encounters:  02/19/23 110/64  08/15/22 112/70  07/20/22 110/74   Wt Readings from Last 3 Encounters:  02/19/23 141 lb (64 kg)  08/15/22 144 lb (65.3 kg)  07/20/22 135 lb (61.2 kg)   Body mass index is 24.98  kg/m.    Physical Exam Constitutional:      General: She is not in acute distress.    Appearance: Normal appearance.  HENT:     Head: Normocephalic and atraumatic.  Eyes:     Conjunctiva/sclera: Conjunctivae normal.  Cardiovascular:     Rate and Rhythm: Normal rate and regular rhythm.     Heart sounds: Normal heart sounds.  Pulmonary:     Effort: Pulmonary effort is normal. No respiratory distress.     Breath sounds: Normal breath sounds. No wheezing.  Musculoskeletal:     Cervical back: Neck supple.     Right lower leg: No edema.     Left lower leg: No edema.  Lymphadenopathy:     Cervical: No cervical adenopathy.  Skin:    General: Skin is warm and dry.     Findings: No rash.  Neurological:     Mental  Status: She is alert. Mental status is at baseline.  Psychiatric:        Mood and Affect: Mood normal.        Behavior: Behavior normal.        Lab Results  Component Value Date   WBC 8.5 08/08/2022   HGB 12.6 08/08/2022   HCT 38.0 08/08/2022   PLT 230.0 08/08/2022   GLUCOSE 93 08/08/2022   CHOL 191 08/08/2022   TRIG 165.0 (H) 08/08/2022   HDL 61.00 08/08/2022   LDLDIRECT 89.0 01/31/2022   LDLCALC 97 08/08/2022   ALT 11 08/08/2022   AST 17 08/08/2022   NA 145 08/08/2022   K 4.5 08/08/2022   CL 107 08/08/2022   CREATININE 1.06 08/08/2022   BUN 14 08/08/2022   CO2 30 08/08/2022   TSH 2.29 02/17/2020   INR 1.9 (A) 01/23/2023   HGBA1C 6.0 08/08/2022     Assessment & Plan:    See Problem List for Assessment and Plan of chronic medical problems.

## 2023-02-19 ENCOUNTER — Other Ambulatory Visit: Payer: Self-pay | Admitting: Cardiovascular Disease

## 2023-02-19 ENCOUNTER — Ambulatory Visit (INDEPENDENT_AMBULATORY_CARE_PROVIDER_SITE_OTHER): Payer: Medicare Other | Admitting: Internal Medicine

## 2023-02-19 ENCOUNTER — Encounter: Payer: Self-pay | Admitting: Internal Medicine

## 2023-02-19 VITALS — BP 110/64 | HR 64 | Temp 97.9°F | Ht 63.0 in | Wt 141.0 lb

## 2023-02-19 DIAGNOSIS — I10 Essential (primary) hypertension: Secondary | ICD-10-CM | POA: Diagnosis not present

## 2023-02-19 DIAGNOSIS — I2510 Atherosclerotic heart disease of native coronary artery without angina pectoris: Secondary | ICD-10-CM

## 2023-02-19 DIAGNOSIS — I4891 Unspecified atrial fibrillation: Secondary | ICD-10-CM | POA: Diagnosis not present

## 2023-02-19 DIAGNOSIS — G6289 Other specified polyneuropathies: Secondary | ICD-10-CM | POA: Diagnosis not present

## 2023-02-19 DIAGNOSIS — N1831 Chronic kidney disease, stage 3a: Secondary | ICD-10-CM | POA: Diagnosis not present

## 2023-02-19 DIAGNOSIS — I6523 Occlusion and stenosis of bilateral carotid arteries: Secondary | ICD-10-CM

## 2023-02-19 DIAGNOSIS — E538 Deficiency of other specified B group vitamins: Secondary | ICD-10-CM

## 2023-02-19 DIAGNOSIS — E1169 Type 2 diabetes mellitus with other specified complication: Secondary | ICD-10-CM

## 2023-02-19 DIAGNOSIS — E782 Mixed hyperlipidemia: Secondary | ICD-10-CM | POA: Diagnosis not present

## 2023-02-19 MED ORDER — FUROSEMIDE 20 MG PO TABS: 20.0000 mg | ORAL_TABLET | ORAL | Status: DC

## 2023-02-19 NOTE — Assessment & Plan Note (Signed)
Chronic Blood pressure well controlled CMP, CBC Continue metoprolol XL 50 mg daily, lisinopril 10 mg daily, isosorbide mononitrate 30 mg daily, diltiazem 120 mg daily

## 2023-02-19 NOTE — Assessment & Plan Note (Signed)
Chronic Likely diabetic or idiopathic Not currently on medication

## 2023-02-19 NOTE — Assessment & Plan Note (Signed)
Chronic With CAD, hyperlipidemia  Lab Results  Component Value Date   HGBA1C 6.0 08/08/2022   Sugars well controlled Continue diet control Encouraged regular exercise, diabetic diet

## 2023-02-19 NOTE — Assessment & Plan Note (Signed)
Chronic Check B12 level Continue B12 supplementation 

## 2023-02-19 NOTE — Assessment & Plan Note (Signed)
Chronic Mild bilaterally Continue ASA, warfarin, crestor 10 mg daily

## 2023-02-19 NOTE — Assessment & Plan Note (Signed)
Chronic Following with cardiology No chest pain, shortness of breath On metoprolol, statin CBC, CMP, lipid panel

## 2023-02-19 NOTE — Assessment & Plan Note (Signed)
Chronic Following with cardiology On diltiazem 120 mg daily, metoprolol XL 50 mg daily and warfarin CBC, CMP, TSH

## 2023-02-19 NOTE — Assessment & Plan Note (Addendum)
Chronic Stable CBC, CMP No fluid overload and denies edema - will try lasix 20 mg every other day - advised to monitor for edema or SOB

## 2023-02-19 NOTE — Assessment & Plan Note (Signed)
Chronic Regular exercise and healthy diet encouraged Check lipid panel Lab Results  Component Value Date   LDLCALC 97 08/08/2022   LDL higher than ideal, but given age will continue current dose of statin Continue Crestor 10 mg daily

## 2023-02-20 ENCOUNTER — Ambulatory Visit (INDEPENDENT_AMBULATORY_CARE_PROVIDER_SITE_OTHER): Payer: Medicare Other

## 2023-02-20 DIAGNOSIS — Z7901 Long term (current) use of anticoagulants: Secondary | ICD-10-CM | POA: Diagnosis not present

## 2023-02-20 LAB — CBC WITH DIFFERENTIAL/PLATELET
Basophils Absolute: 0.1 10*3/uL (ref 0.0–0.1)
Basophils Relative: 1.1 % (ref 0.0–3.0)
Eosinophils Absolute: 0.3 10*3/uL (ref 0.0–0.7)
Eosinophils Relative: 5.2 % — ABNORMAL HIGH (ref 0.0–5.0)
HCT: 35.7 % — ABNORMAL LOW (ref 36.0–46.0)
Hemoglobin: 11.6 g/dL — ABNORMAL LOW (ref 12.0–15.0)
Lymphocytes Relative: 21.3 % (ref 12.0–46.0)
Lymphs Abs: 1.3 10*3/uL (ref 0.7–4.0)
MCHC: 32.5 g/dL (ref 30.0–36.0)
MCV: 91.9 fl (ref 78.0–100.0)
Monocytes Absolute: 0.6 10*3/uL (ref 0.1–1.0)
Monocytes Relative: 9.2 % (ref 3.0–12.0)
Neutro Abs: 4 10*3/uL (ref 1.4–7.7)
Neutrophils Relative %: 63.2 % (ref 43.0–77.0)
Platelets: 236 10*3/uL (ref 150.0–400.0)
RBC: 3.88 Mil/uL (ref 3.87–5.11)
RDW: 15.6 % — ABNORMAL HIGH (ref 11.5–15.5)
WBC: 6.3 10*3/uL (ref 4.0–10.5)

## 2023-02-20 LAB — VITAMIN D 25 HYDROXY (VIT D DEFICIENCY, FRACTURES): VITD: 71.05 ng/mL (ref 30.00–100.00)

## 2023-02-20 LAB — LIPID PANEL
Cholesterol: 159 mg/dL (ref 0–200)
HDL: 58.2 mg/dL (ref >39.00)
LDL Cholesterol: 60 mg/dL (ref 0–99)
NonHDL: 100.41
Total CHOL/HDL Ratio: 3
Triglycerides: 202 mg/dL — ABNORMAL HIGH (ref 0.0–149.0)
VLDL: 40.4 mg/dL — ABNORMAL HIGH (ref 0.0–40.0)

## 2023-02-20 LAB — POCT INR: INR: 1.9 — AB (ref 2.0–3.0)

## 2023-02-20 LAB — COMPREHENSIVE METABOLIC PANEL
ALT: 12 U/L (ref 0–35)
AST: 19 U/L (ref 0–37)
Albumin: 3.8 g/dL (ref 3.5–5.2)
Alkaline Phosphatase: 69 U/L (ref 39–117)
BUN: 14 mg/dL (ref 6–23)
CO2: 29 meq/L (ref 19–32)
Calcium: 9.2 mg/dL (ref 8.4–10.5)
Chloride: 107 meq/L (ref 96–112)
Creatinine, Ser: 0.96 mg/dL (ref 0.40–1.20)
GFR: 52.07 mL/min — ABNORMAL LOW (ref >60.00)
Glucose, Bld: 116 mg/dL — ABNORMAL HIGH (ref 70–99)
Potassium: 3.4 meq/L — ABNORMAL LOW (ref 3.5–5.1)
Sodium: 143 meq/L (ref 135–145)
Total Bilirubin: 0.3 mg/dL (ref 0.2–1.2)
Total Protein: 6.4 g/dL (ref 6.0–8.3)

## 2023-02-20 LAB — HEMOGLOBIN A1C: Hgb A1c MFr Bld: 6.3 % (ref 4.6–6.5)

## 2023-02-20 LAB — VITAMIN B12: Vitamin B-12: >1501 pg/mL — ABNORMAL HIGH (ref 211–911)

## 2023-02-20 LAB — TSH: TSH: 1.62 u[IU]/mL (ref 0.35–5.50)

## 2023-02-20 NOTE — Progress Notes (Signed)
Increase dose today to take 3.5 mg and then change weekly dose to take 2.5 mg daily except take 1.25 mg on Mondays and Fridays. Recheck in 3 weeks.

## 2023-02-20 NOTE — Patient Instructions (Addendum)
Pre visit review using our clinic review tool, if applicable. No additional management support is needed unless otherwise documented below in the visit note.  Increase dose today to take 3.5 mg and then change weekly dose to take 2.5 mg daily except take 1.25 mg on Mondays and Fridays. Recheck in 3 weeks.

## 2023-03-13 ENCOUNTER — Ambulatory Visit (INDEPENDENT_AMBULATORY_CARE_PROVIDER_SITE_OTHER): Payer: Medicare Other

## 2023-03-13 DIAGNOSIS — Z7901 Long term (current) use of anticoagulants: Secondary | ICD-10-CM

## 2023-03-13 DIAGNOSIS — Z23 Encounter for immunization: Secondary | ICD-10-CM

## 2023-03-13 LAB — POCT INR: INR: 2 (ref 2.0–3.0)

## 2023-03-13 NOTE — Patient Instructions (Addendum)
Pre visit review using our clinic review tool, if applicable. No additional management support is needed unless otherwise documented below in the visit note.  Continue 2.5 mg daily except take 1.25 mg on Mondays and Fridays. Recheck in 4 weeks.

## 2023-03-13 NOTE — Progress Notes (Signed)
Continue 2.5 mg daily except take 1.25 mg on Mondays and Fridays. Recheck in 4 weeks.   Pt requested flu vaccine. Administered flu vaccine in L deltoid. Pt tolerated well.

## 2023-03-27 DIAGNOSIS — M1712 Unilateral primary osteoarthritis, left knee: Secondary | ICD-10-CM | POA: Diagnosis not present

## 2023-03-31 ENCOUNTER — Other Ambulatory Visit: Payer: Self-pay | Admitting: Cardiovascular Disease

## 2023-04-03 DIAGNOSIS — H353223 Exudative age-related macular degeneration, left eye, with inactive scar: Secondary | ICD-10-CM | POA: Diagnosis not present

## 2023-04-03 DIAGNOSIS — H353211 Exudative age-related macular degeneration, right eye, with active choroidal neovascularization: Secondary | ICD-10-CM | POA: Diagnosis not present

## 2023-04-03 DIAGNOSIS — H35372 Puckering of macula, left eye: Secondary | ICD-10-CM | POA: Diagnosis not present

## 2023-04-03 DIAGNOSIS — H43813 Vitreous degeneration, bilateral: Secondary | ICD-10-CM | POA: Diagnosis not present

## 2023-04-03 DIAGNOSIS — H04123 Dry eye syndrome of bilateral lacrimal glands: Secondary | ICD-10-CM | POA: Diagnosis not present

## 2023-04-10 ENCOUNTER — Ambulatory Visit (INDEPENDENT_AMBULATORY_CARE_PROVIDER_SITE_OTHER): Payer: Medicare Other

## 2023-04-10 DIAGNOSIS — Z7901 Long term (current) use of anticoagulants: Secondary | ICD-10-CM | POA: Diagnosis not present

## 2023-04-10 LAB — POCT INR: INR: 1.8 — AB (ref 2.0–3.0)

## 2023-04-10 NOTE — Patient Instructions (Addendum)
Pre visit review using our clinic review tool, if applicable. No additional management support is needed unless otherwise documented below in the visit note.  Increase dose today to take 1 1/2 tablets and then continue 2.5 mg daily except take 1.25 mg on Mondays and Fridays. Recheck in 3 weeks.

## 2023-04-10 NOTE — Progress Notes (Signed)
Increase dose today to take 1 1/2 tablets and then continue 2.5 mg daily except take 1.25 mg on Mondays and Fridays. Recheck in 3 weeks.

## 2023-05-01 ENCOUNTER — Ambulatory Visit (INDEPENDENT_AMBULATORY_CARE_PROVIDER_SITE_OTHER): Payer: Medicare Other

## 2023-05-01 DIAGNOSIS — Z7901 Long term (current) use of anticoagulants: Secondary | ICD-10-CM | POA: Diagnosis not present

## 2023-05-01 LAB — POCT INR: INR: 1.9 — AB (ref 2.0–3.0)

## 2023-05-01 NOTE — Progress Notes (Signed)
Increase dose today to take 1 1/2 tablets and then change weekly dose to take 2.5 mg daily except take 1.25 mg on Fridays. Recheck in 3 weeks.

## 2023-05-01 NOTE — Patient Instructions (Addendum)
Pre visit review using our clinic review tool, if applicable. No additional management support is needed unless otherwise documented below in the visit note.  Increase dose today to take 1 1/2 tablets and then change weekly dose to take 2.5 mg daily except take 1.25 mg on Fridays. Recheck in 3 weeks.

## 2023-05-18 NOTE — Progress Notes (Unsigned)
Date:  05/18/2023   ID:  Judy [name], DOB 1930-07-08, MRN [id number]  PCP:  Pincus Sanes, MD  Cardiologist:   Eden Emms Electrophysiologist:  None   Evaluation Performed:  Follow-Up Visit  Chief Complaint:  CAD/AFib  History of Present Illness:    89 y.o. history of CAD. First stent RCA 1996 and PDA/OM in 2006. Distant cerebellar stroke No carotid disease. Chronic afib on coumadin. Activity limited by macular degeneration and peripheral neuropathy. Has seen primary and neurology on B12 ? From diet controlled DM as well On statin for HLD  Statin Changed to crestor due to interaction of simvastatin with cardizem    No palpitations, bleeding , chest pain Toprol dose decreased in July 2022 due to relative bradycardia   She has no cardiac  complaints    Gout of right first MCP May 2022 Rx with Colchicine Uric acid 7.1 just above normal limit of 7.0   Lost some weight with tonsillitis and COVID latter in February 2023   Upset about primary care lack Of being proactive with Rx of COVID   DOAC;s have been too expensive in past   Carotids 09/22/22 plaque no stenosis   ***   Past Medical History:  Diagnosis Date   Atrial fibrillation (HCC)    a. chronic anticoag, failed prior DCCV;  b. 03/2010 Echo: EF 55-65%, mod MR, mildly to mod dil LA, mod dil RA, mild to mod MR.   CAD (coronary artery disease)    a. BMS to RCA 96';  b. Stent PDA and OM DES 2006   CAROTID ARTERY DISEASE    a. 11/2011 Carotid U/S:  0-39% bilat dzs.   HTN (hypertension)    Hyperlipidemia    LBBB (left bundle branch block)    Macular degeneration, wet (HCC) 10/2012 dx   L>R, follows with retinal specialist->injections monthly.   Obesity    Vertigo    Past Surgical History:  Procedure Laterality Date   CORONARY STENT PLACEMENT  1996, 2006 x2   Drug-eluting stent placement to the PDA, drug-eluting stent  placement to the first obtuse  marginal, StarClose closure of the right common femoral arteriotomy site.  Successful drug-eluting stent  placement in  both the posterior descending artery and the obtuse marginal. The lesion was directly stented using a 2.5 x 16 mm Taxus deployed at 14 atmospheres.    EYE SURGERY     Tear duct stent     No outpatient medications have been marked as taking for the 05/24/23 encounter (Appointment) with Wendall Stade, MD.     Allergies:   Betadine [povidone iodine], Betadine [povidone-iodine], and Methocarbamol   Social History   Tobacco Use   Smoking status: Never   Smokeless tobacco: Never  Vaping Use   Vaping status: Never Used  Substance Use Topics   Alcohol use: No   Drug use: No     Family Hx: The patient's family history includes Arthritis in her father and mother; CAD in her brother, brother, and sister.  ROS:   Please see the history of present illness.     All other systems reviewed and are negative.   Prior CV studies:   The following studies were reviewed today:  Carotid 12/18/18 plaque no stenosis   Labs/Other Tests and Data Reviewed:    EKG:  05/18/2023 0  afib old IMI IVCD nonspecific ST changes   Recent Labs: 02/20/2023: ALT 12; BUN 14; Creatinine, Ser 0.96; Hemoglobin 11.6; Platelets 236.0; Potassium 3.4;  Sodium 143; TSH 1.62   Recent Lipid Panel Lab Results  Component Value Date/Time   CHOL 159 02/20/2023 10:03 AM   CHOL 178 05/12/2019 09:25 AM   TRIG 202.0 (H) 02/20/2023 10:03 AM   HDL 58.20 02/20/2023 10:03 AM   HDL 47 05/12/2019 09:25 AM   CHOLHDL 3 02/20/2023 10:03 AM   LDLCALC 60 02/20/2023 10:03 AM   LDLCALC 98 02/17/2020 10:16 AM   LDLDIRECT 89.0 01/31/2022 04:29 PM    Wt Readings from Last 3 Encounters:  02/19/23 141 lb (64 kg)  08/15/22 144 lb (65.3 kg)  07/20/22 135 lb (61.2 kg)     Objective:    Vital Signs:  There were no vitals taken for this visit.   Affect appropriate Healthy:  appears stated age HEENT: normal Neck supple with no adenopathy JVP normal no bruits no thyromegaly Lungs clear  with no wheezing and good diaphragmatic motion Heart:  S1/S2 no murmur, no rub, gallop or click PMI normal Abdomen: benighn, BS positve, no tenderness, no AAA no bruit.  No HSM or HJR Distal pulses intact with no bruits No edema Neuro non-focal Skin warm and dry No muscular weakness   ASSESSMENT & PLAN:    CAD:  Distant intervention to RCA and OM last in 2006 no angina continue medical RX given age  HLD:  Continue statin LDL 73   DM:  Diet controlled A1c 6.6 01/31/22   Afib:  Toporl decreased to 50 mg 12/28/20 INR been on low side  Recently f/u coumadin clinic will see what patient assistance is available for xarelto Previously as much as $140/month   Neuropathy:  On B12 ? Related to DM f/u neurology   Macular Degeneration: f/u Dr Lelon Perla has had injections and improved  Tests Ordered:  None   Medication Changes:  None   Disposition:  Follow up in a year   Signed, Charlton Haws, MD  05/18/2023 1:36 PM    [facility]

## 2023-05-22 ENCOUNTER — Ambulatory Visit (INDEPENDENT_AMBULATORY_CARE_PROVIDER_SITE_OTHER): Payer: Medicare Other

## 2023-05-22 DIAGNOSIS — Z7901 Long term (current) use of anticoagulants: Secondary | ICD-10-CM | POA: Diagnosis not present

## 2023-05-22 LAB — POCT INR: INR: 2.3 (ref 2.0–3.0)

## 2023-05-22 NOTE — Patient Instructions (Addendum)
Pre visit review using our clinic review tool, if applicable. No additional management support is needed unless otherwise documented below in the visit note.  Continue 2.5 mg daily except take 1.25 mg on Fridays. Recheck in 4 weeks.

## 2023-05-22 NOTE — Progress Notes (Signed)
Continue 2.5 mg daily except take 1.25 mg on Fridays. Recheck in 4 weeks.

## 2023-05-24 ENCOUNTER — Ambulatory Visit: Payer: Medicare Other | Attending: Cardiovascular Disease | Admitting: Cardiovascular Disease

## 2023-05-24 VITALS — BP 122/80 | HR 86 | Ht 63.0 in | Wt 136.8 lb

## 2023-05-24 DIAGNOSIS — I482 Chronic atrial fibrillation, unspecified: Secondary | ICD-10-CM | POA: Diagnosis not present

## 2023-05-24 DIAGNOSIS — E782 Mixed hyperlipidemia: Secondary | ICD-10-CM | POA: Diagnosis not present

## 2023-05-24 DIAGNOSIS — I2510 Atherosclerotic heart disease of native coronary artery without angina pectoris: Secondary | ICD-10-CM | POA: Diagnosis not present

## 2023-05-24 NOTE — Patient Instructions (Addendum)
Medication Instructions:  Your physician recommends that you continue on your current medications as directed. Please refer to the Current Medication list given to you today.  *If you need a refill on your cardiac medications before your next appointment, please call your pharmacy*  Lab Work: If you have labs (blood work) drawn today and your tests are completely normal, you will receive your results only by: MyChart Message (if you have MyChart) OR A paper copy in the mail If you have any lab test that is abnormal or we need to change your treatment, we will call you to review the results.  Testing/Procedures: None ordered today.  Follow-Up: At [facility], you and your health needs are our priority.  As part of our continuing mission to provide you with exceptional heart care, we have created designated Provider Care Teams.  These Care Teams include your primary Cardiologist (physician) and Advanced Practice Providers (APPs -  Physician Assistants and Nurse Practitioners) who all work together to provide you with the care you need, when you need it.  We recommend signing up for the patient portal called "MyChart".  Sign up information is provided on this After Visit Summary.  MyChart is used to connect with patients for Virtual Visits (Telemedicine).  Patients are able to view lab/test results, encounter notes, upcoming appointments, etc.  Non-urgent messages can be sent to your provider as well.   To learn more about what you can do with MyChart, go to [email or web address].    Your next appointment:   6 month(s)  Provider:   Peter Nishan, MD      

## 2023-06-19 ENCOUNTER — Ambulatory Visit (INDEPENDENT_AMBULATORY_CARE_PROVIDER_SITE_OTHER): Payer: Medicare Other

## 2023-06-19 DIAGNOSIS — H35372 Puckering of macula, left eye: Secondary | ICD-10-CM | POA: Diagnosis not present

## 2023-06-19 DIAGNOSIS — H353223 Exudative age-related macular degeneration, left eye, with inactive scar: Secondary | ICD-10-CM | POA: Diagnosis not present

## 2023-06-19 DIAGNOSIS — H353211 Exudative age-related macular degeneration, right eye, with active choroidal neovascularization: Secondary | ICD-10-CM | POA: Diagnosis not present

## 2023-06-19 DIAGNOSIS — H43813 Vitreous degeneration, bilateral: Secondary | ICD-10-CM | POA: Diagnosis not present

## 2023-06-19 DIAGNOSIS — Z7901 Long term (current) use of anticoagulants: Secondary | ICD-10-CM

## 2023-06-19 DIAGNOSIS — H04123 Dry eye syndrome of bilateral lacrimal glands: Secondary | ICD-10-CM | POA: Diagnosis not present

## 2023-06-19 LAB — POCT INR: INR: 1.5 — AB (ref 2.0–3.0)

## 2023-06-19 NOTE — Progress Notes (Signed)
 Pt has been eating more greens due to holiday and missed a dose about 2 weeks ago. Pt has also been eating a lot of pecans. Increase dose today and tomorrow to take 1 1/2 tablets and then continue 2.5 mg daily except take 1.25 mg on Fridays. Recheck in 2 weeks.

## 2023-06-19 NOTE — Patient Instructions (Addendum)
 Pre visit review using our clinic review tool, if applicable. No additional management support is needed unless otherwise documented below in the visit note.  Increase dose today and tomorrow to take 1 1/2 tablets and then continue 2.5 mg daily except take 1.25 mg on Fridays. Recheck in 2 weeks.

## 2023-06-20 ENCOUNTER — Other Ambulatory Visit: Payer: Self-pay | Admitting: Cardiovascular Disease

## 2023-06-23 ENCOUNTER — Other Ambulatory Visit: Payer: Self-pay | Admitting: Cardiovascular Disease

## 2023-06-24 ENCOUNTER — Other Ambulatory Visit: Payer: Self-pay | Admitting: Internal Medicine

## 2023-06-24 DIAGNOSIS — Z7901 Long term (current) use of anticoagulants: Secondary | ICD-10-CM

## 2023-06-25 NOTE — Telephone Encounter (Signed)
 Pt is compliant with warfarin management and PCP apts.  Sent in refill of warfarin to requested pharmacy.

## 2023-06-26 ENCOUNTER — Other Ambulatory Visit: Payer: Self-pay | Admitting: Cardiovascular Disease

## 2023-07-03 ENCOUNTER — Ambulatory Visit (INDEPENDENT_AMBULATORY_CARE_PROVIDER_SITE_OTHER): Payer: Medicare Other

## 2023-07-03 DIAGNOSIS — Z7901 Long term (current) use of anticoagulants: Secondary | ICD-10-CM | POA: Diagnosis not present

## 2023-07-03 LAB — POCT INR: INR: 2 (ref 2.0–3.0)

## 2023-07-03 NOTE — Patient Instructions (Addendum)
 Pre visit review using our clinic review tool, if applicable. No additional management support is needed unless otherwise documented below in the visit note.  Continue 2.5 mg daily except take 1.25 mg on Fridays. Recheck in 4 weeks.

## 2023-07-03 NOTE — Progress Notes (Signed)
 Continue 2.5 mg daily except take 1.25 mg on Fridays. Recheck in 4 weeks.

## 2023-07-05 DIAGNOSIS — M1712 Unilateral primary osteoarthritis, left knee: Secondary | ICD-10-CM | POA: Diagnosis not present

## 2023-07-31 ENCOUNTER — Ambulatory Visit (INDEPENDENT_AMBULATORY_CARE_PROVIDER_SITE_OTHER): Payer: Medicare Other

## 2023-07-31 ENCOUNTER — Telehealth: Payer: Self-pay

## 2023-07-31 DIAGNOSIS — E782 Mixed hyperlipidemia: Secondary | ICD-10-CM

## 2023-07-31 DIAGNOSIS — E1169 Type 2 diabetes mellitus with other specified complication: Secondary | ICD-10-CM

## 2023-07-31 DIAGNOSIS — D649 Anemia, unspecified: Secondary | ICD-10-CM

## 2023-07-31 DIAGNOSIS — Z7901 Long term (current) use of anticoagulants: Secondary | ICD-10-CM

## 2023-07-31 DIAGNOSIS — E538 Deficiency of other specified B group vitamins: Secondary | ICD-10-CM

## 2023-07-31 DIAGNOSIS — I2510 Atherosclerotic heart disease of native coronary artery without angina pectoris: Secondary | ICD-10-CM

## 2023-07-31 DIAGNOSIS — I4891 Unspecified atrial fibrillation: Secondary | ICD-10-CM

## 2023-07-31 DIAGNOSIS — I10 Essential (primary) hypertension: Secondary | ICD-10-CM

## 2023-07-31 DIAGNOSIS — N1831 Chronic kidney disease, stage 3a: Secondary | ICD-10-CM

## 2023-07-31 LAB — POCT INR: INR: 2.1 (ref 2.0–3.0)

## 2023-07-31 NOTE — Patient Instructions (Addendum)
 Pre visit review using our clinic review tool, if applicable. No additional management support is needed unless otherwise documented below in the visit note.  Continue 2.5 mg daily except take 1.25 mg on Fridays. Recheck in 4 weeks.

## 2023-07-31 NOTE — Progress Notes (Signed)
 Continue 2.5 mg daily except take 1.25 mg on Fridays. Recheck in 4 weeks.

## 2023-07-31 NOTE — Telephone Encounter (Signed)
Pt and her daughter in today for INR check. They would like to know if Dr. Lawerance Bach will allow pt to have labs done a week ahead of her apts with her. They would are requesting this because they would like time to review those results with the provider. They also requested if the office does have to contact the pt about any lab results to contact her by phone and not through mychart. Pt does not use mychart on a consistent basis. Advised a msg would be sent to PCP requesting labs a week before her upcoming apt on 3/12.

## 2023-08-01 DIAGNOSIS — D649 Anemia, unspecified: Secondary | ICD-10-CM | POA: Insufficient documentation

## 2023-08-01 NOTE — Telephone Encounter (Signed)
 Labs ordered.

## 2023-08-02 NOTE — Telephone Encounter (Signed)
Message left for patient today.  If she calls back message left for daughter to set up lab appointment a week before her scheduled appointment on 3/12.

## 2023-08-12 ENCOUNTER — Other Ambulatory Visit: Payer: Self-pay | Admitting: Cardiovascular Disease

## 2023-08-13 ENCOUNTER — Encounter: Payer: Self-pay | Admitting: Internal Medicine

## 2023-08-13 ENCOUNTER — Other Ambulatory Visit (INDEPENDENT_AMBULATORY_CARE_PROVIDER_SITE_OTHER): Payer: Medicare Other

## 2023-08-13 DIAGNOSIS — N1831 Chronic kidney disease, stage 3a: Secondary | ICD-10-CM | POA: Diagnosis not present

## 2023-08-13 DIAGNOSIS — I10 Essential (primary) hypertension: Secondary | ICD-10-CM

## 2023-08-13 DIAGNOSIS — E1169 Type 2 diabetes mellitus with other specified complication: Secondary | ICD-10-CM | POA: Diagnosis not present

## 2023-08-13 DIAGNOSIS — E538 Deficiency of other specified B group vitamins: Secondary | ICD-10-CM | POA: Diagnosis not present

## 2023-08-13 DIAGNOSIS — E782 Mixed hyperlipidemia: Secondary | ICD-10-CM

## 2023-08-13 DIAGNOSIS — I4891 Unspecified atrial fibrillation: Secondary | ICD-10-CM | POA: Diagnosis not present

## 2023-08-13 DIAGNOSIS — D649 Anemia, unspecified: Secondary | ICD-10-CM

## 2023-08-13 LAB — COMPREHENSIVE METABOLIC PANEL
ALT: 14 U/L (ref 0–35)
AST: 19 U/L (ref 0–37)
Albumin: 4.1 g/dL (ref 3.5–5.2)
Alkaline Phosphatase: 64 U/L (ref 39–117)
BUN: 16 mg/dL (ref 6–23)
CO2: 28 meq/L (ref 19–32)
Calcium: 9.5 mg/dL (ref 8.4–10.5)
Chloride: 108 meq/L (ref 96–112)
Creatinine, Ser: 1.13 mg/dL (ref 0.40–1.20)
GFR: 42.68 mL/min — ABNORMAL LOW (ref >60.00)
Glucose, Bld: 102 mg/dL — ABNORMAL HIGH (ref 70–99)
Potassium: 4 meq/L (ref 3.5–5.1)
Sodium: 145 meq/L (ref 135–145)
Total Bilirubin: 0.3 mg/dL (ref 0.2–1.2)
Total Protein: 6.6 g/dL (ref 6.0–8.3)

## 2023-08-13 LAB — CBC WITH DIFFERENTIAL/PLATELET
Basophils Absolute: 0 10*3/uL (ref 0.0–0.1)
Basophils Relative: 0.5 % (ref 0.0–3.0)
Eosinophils Absolute: 0.1 10*3/uL (ref 0.0–0.7)
Eosinophils Relative: 1.9 % (ref 0.0–5.0)
HCT: 39.4 % (ref 36.0–46.0)
Hemoglobin: 13.1 g/dL (ref 12.0–15.0)
Lymphocytes Relative: 21.2 % (ref 12.0–46.0)
Lymphs Abs: 1.6 10*3/uL (ref 0.7–4.0)
MCHC: 33.3 g/dL (ref 30.0–36.0)
MCV: 92.7 fl (ref 78.0–100.0)
Monocytes Absolute: 0.6 10*3/uL (ref 0.1–1.0)
Monocytes Relative: 8.7 % (ref 3.0–12.0)
Neutro Abs: 5 10*3/uL (ref 1.4–7.7)
Neutrophils Relative %: 67.7 % (ref 43.0–77.0)
Platelets: 231 10*3/uL (ref 150.0–400.0)
RBC: 4.25 Mil/uL (ref 3.87–5.11)
RDW: 13.9 % (ref 11.5–15.5)
WBC: 7.3 10*3/uL (ref 4.0–10.5)

## 2023-08-13 LAB — IBC PANEL
Iron: 116 ug/dL (ref 42–145)
Saturation Ratios: 41.8 % (ref 20.0–50.0)
TIBC: 277.2 ug/dL (ref 250.0–450.0)
Transferrin: 198 mg/dL — ABNORMAL LOW (ref 212.0–360.0)

## 2023-08-13 LAB — LIPID PANEL
Cholesterol: 176 mg/dL (ref 0–200)
HDL: 63.7 mg/dL (ref >39.00)
LDL Cholesterol: 64 mg/dL (ref 0–99)
NonHDL: 112.03
Total CHOL/HDL Ratio: 3
Triglycerides: 241 mg/dL — ABNORMAL HIGH (ref 0.0–149.0)
VLDL: 48.2 mg/dL — ABNORMAL HIGH (ref 0.0–40.0)

## 2023-08-13 LAB — HEMOGLOBIN A1C: Hgb A1c MFr Bld: 5.9 % (ref 4.6–6.5)

## 2023-08-13 LAB — VITAMIN B12: Vitamin B-12: >1537 pg/mL — ABNORMAL HIGH (ref 211–911)

## 2023-08-13 LAB — FERRITIN: Ferritin: 108.8 ng/mL (ref 10.0–291.0)

## 2023-08-21 ENCOUNTER — Encounter: Payer: Self-pay | Admitting: Internal Medicine

## 2023-08-21 NOTE — Progress Notes (Unsigned)
 Subjective:    Patient ID: Judy [name], female    DOB: 1930/11/20, 89 y.o.   MRN: [id number]     HPI Sandria is here for follow up of her chronic medical problems. She is here with her daughter.  Had blood work done - stable   L knee pain > right knee - [facility] given her injections previously  Medications and allergies reviewed with patient and updated if appropriate.  Current Outpatient Medications on File Prior to Visit  Medication Sig Dispense Refill   diltiazem (CARDIZEM CD) 120 MG 24 hr capsule TAKE 1 CAPSULE BY MOUTH EVERY DAY 90 capsule 2   potassium chloride (MICRO-K) 10 MEQ CR capsule TAKE 1 CAPSULE BY MOUTH EVERY DAY 90 capsule 2   acetaminophen (TYLENOL) 500 MG tablet Take 500 mg by mouth every 6 (six) hours as needed.     aspirin 81 MG tablet Take 81 mg by mouth daily.     azelastine (ASTELIN) 0.1 % nasal spray Place 2 sprays into the nose 2 (two) times daily as needed for rhinitis or allergies.     b complex vitamins tablet Take 1 tablet by mouth daily.     furosemide (LASIX) 20 MG tablet TAKE 1 TABLET BY MOUTH EVERY DAY 90 tablet 1   isosorbide mononitrate (IMDUR) 30 MG 24 hr tablet TAKE 1 TABLET BY MOUTH EVERYDAY AT BEDTIME 90 tablet 1   lisinopril (ZESTRIL) 10 MG tablet TAKE 1 TABLET BY MOUTH EVERY DAY 90 tablet 3   metoprolol succinate (TOPROL-XL) 50 MG 24 hr tablet TAKE 1 TABLET BY MOUTH EVERY DAY WITH OR IMMEDIATELY FOLLOWING A MEAL 90 tablet 1   Multiple Vitamin (MULTIVITAMIN WITH MINERALS) TABS tablet Take 1 tablet by mouth daily.     nitroGLYCERIN (NITROSTAT) 0.4 MG SL tablet PLACE 1 TABLET UNDER TONGUE EVERY 5 MINUTES AS NEEDED FOR CHEST PAIN 25 tablet 8   rosuvastatin (CRESTOR) 10 MG tablet TAKE 1 TABLET BY MOUTH ONCE DAILY 90 tablet 3   sodium chloride (OCEAN) 0.65 % SOLN nasal spray Place 1 spray into both nostrils as needed for congestion.     tobramycin (TOBREX) 0.3 % ophthalmic solution PLEASE SEE ATTACHED FOR DETAILED DIRECTIONS      warfarin (COUMADIN) 1 MG tablet TAKE 1 TABLET BY MOUTH MONDAYS, WEDNESDAYS, FRIDAYS AND SATURDAYS OR AS DIRECTED BY ANTICOAGULATION CLINIC. 65 tablet 1   warfarin (COUMADIN) 2.5 MG tablet TAKE 1 TABLET BY MOUTH DAILY EXCEPT TAKE 1/2 TABLET ON FRIDAYS OR AS DIRECTED BY ANTICOAGULATION CLINIC 100 tablet 1   No current facility-administered medications on file prior to visit.     Review of Systems  Constitutional:  Negative for appetite change.  HENT:  Positive for congestion and rhinorrhea.   Respiratory:  Negative for cough, shortness of breath and wheezing.   Cardiovascular:  Negative for chest pain, palpitations and leg swelling.  Gastrointestinal:        Some indigestion  Musculoskeletal:  Positive for arthralgias (knees).  Neurological:  Negative for dizziness, light-headedness and headaches.  Psychiatric/Behavioral:  Negative for sleep disturbance.        Objective:   Vitals:   08/22/23 1005  BP: 118/60  Pulse: 68  Temp: 98 F (36.7 C)  SpO2: 95%   BP Readings from Last 3 Encounters:  08/22/23 118/60  05/24/23 122/80  02/19/23 110/64   Wt Readings from Last 3 Encounters:  08/22/23 139 lb 12.8 oz (63.4 kg)  05/24/23 136 lb 12.8 oz (  62.1 kg)  02/19/23 141 lb (64 kg)   Body mass index is 24.76 kg/m.    Physical Exam Constitutional:      General: She is not in acute distress.    Appearance: Normal appearance.  HENT:     Head: Normocephalic and atraumatic.  Eyes:     Conjunctiva/sclera: Conjunctivae normal.  Cardiovascular:     Rate and Rhythm: Normal rate and regular rhythm.     Heart sounds: Murmur (3/6 systolic) heard.  Pulmonary:     Effort: Pulmonary effort is normal. No respiratory distress.     Breath sounds: Normal breath sounds. No wheezing.  Musculoskeletal:     Cervical back: Neck supple.     Right lower leg: No edema.     Left lower leg: No edema.  Lymphadenopathy:     Cervical: No cervical adenopathy.  Skin:    General: Skin is warm and  dry.     Findings: No rash.  Neurological:     Mental Status: She is alert. Mental status is at baseline.  Psychiatric:        Mood and Affect: Mood normal.        Behavior: Behavior normal.        Lab Results  Component Value Date   WBC 7.3 08/13/2023   HGB 13.1 08/13/2023   HCT 39.4 08/13/2023   PLT 231.0 08/13/2023   GLUCOSE 102 (H) 08/13/2023   CHOL 176 08/13/2023   TRIG 241.0 (H) 08/13/2023   HDL 63.70 08/13/2023   LDLDIRECT 89.0 01/31/2022   LDLCALC 64 08/13/2023   ALT 14 08/13/2023   AST 19 08/13/2023   NA 145 08/13/2023   K 4.0 08/13/2023   CL 108 08/13/2023   CREATININE 1.13 08/13/2023   BUN 16 08/13/2023   CO2 28 08/13/2023   TSH 1.62 02/20/2023   INR 2.1 07/31/2023   HGBA1C 5.9 08/13/2023     Assessment & Plan:    See Problem List for Assessment and Plan of chronic medical problems.

## 2023-08-21 NOTE — Patient Instructions (Addendum)
      Blood work was ordered for your next visit.       Medications changes include :   None    A referral was ordered and someone will call you to schedule an appointment.     Return in about 6 months (around 02/22/2024) for follow up, schedule lab appt for her next visit.

## 2023-08-22 ENCOUNTER — Ambulatory Visit (INDEPENDENT_AMBULATORY_CARE_PROVIDER_SITE_OTHER): Payer: Medicare Other | Admitting: Internal Medicine

## 2023-08-22 VITALS — BP 118/60 | HR 68 | Temp 98.0°F | Ht 63.0 in | Wt 139.8 lb

## 2023-08-22 DIAGNOSIS — E782 Mixed hyperlipidemia: Secondary | ICD-10-CM

## 2023-08-22 DIAGNOSIS — I6523 Occlusion and stenosis of bilateral carotid arteries: Secondary | ICD-10-CM

## 2023-08-22 DIAGNOSIS — I4891 Unspecified atrial fibrillation: Secondary | ICD-10-CM

## 2023-08-22 DIAGNOSIS — E538 Deficiency of other specified B group vitamins: Secondary | ICD-10-CM | POA: Diagnosis not present

## 2023-08-22 DIAGNOSIS — N1831 Chronic kidney disease, stage 3a: Secondary | ICD-10-CM

## 2023-08-22 DIAGNOSIS — I2510 Atherosclerotic heart disease of native coronary artery without angina pectoris: Secondary | ICD-10-CM

## 2023-08-22 DIAGNOSIS — E1169 Type 2 diabetes mellitus with other specified complication: Secondary | ICD-10-CM

## 2023-08-22 DIAGNOSIS — I10 Essential (primary) hypertension: Secondary | ICD-10-CM

## 2023-08-22 DIAGNOSIS — Z8673 Personal history of transient ischemic attack (TIA), and cerebral infarction without residual deficits: Secondary | ICD-10-CM | POA: Diagnosis not present

## 2023-08-22 MED ORDER — NITROGLYCERIN 0.4 MG SL SUBL: SUBLINGUAL_TABLET | SUBLINGUAL | 8 refills | Status: AC

## 2023-08-22 NOTE — Assessment & Plan Note (Signed)
 Chronic With CAD, hyperlipidemia  Lab Results  Component Value Date   HGBA1C 5.9 08/13/2023   Sugars well controlled Continue diet control Encouraged regular exercise, diabetic diet

## 2023-08-22 NOTE — Assessment & Plan Note (Signed)
Chronic Mild bilaterally Continue ASA, warfarin, crestor 10 mg daily

## 2023-08-22 NOTE — Assessment & Plan Note (Signed)
Chronic-history of stroke Continue warfarin, aspirin 81 mg daily, Crestor 10 mg daily Blood pressure well-controlled Sugars controlled

## 2023-08-22 NOTE — Assessment & Plan Note (Signed)
Chronic Blood pressure well controlled CMP, CBC Continue metoprolol XL 50 mg daily, lisinopril 10 mg daily, isosorbide mononitrate 30 mg daily, diltiazem 120 mg daily

## 2023-08-22 NOTE — Assessment & Plan Note (Signed)
 Chronic Stable CBC, CMP Continue lasix 20 mg daily

## 2023-08-22 NOTE — Assessment & Plan Note (Signed)
 Chronic Regular exercise and healthy diet encouraged Check lipid panel Lab Results  Component Value Date   LDLCALC 64 08/13/2023   LDL well controlled Continue Crestor 10 mg daily

## 2023-08-22 NOTE — Assessment & Plan Note (Signed)
 Chronic Following with cardiology No chest pain, shortness of breath On metoprolol, statin, ASA CBC, CMP, lipid panel

## 2023-08-22 NOTE — Assessment & Plan Note (Signed)
 Chronic Following with cardiology On diltiazem 120 mg daily, metoprolol XL 50 mg daily and warfarin CBC, CMP

## 2023-08-22 NOTE — Assessment & Plan Note (Signed)
 Chronic Lab Results  Component Value Date   VITAMINB12 >1537 (H) 08/13/2023   Continue B12 supplementation

## 2023-08-28 ENCOUNTER — Ambulatory Visit (INDEPENDENT_AMBULATORY_CARE_PROVIDER_SITE_OTHER): Payer: Medicare Other

## 2023-08-28 DIAGNOSIS — H04123 Dry eye syndrome of bilateral lacrimal glands: Secondary | ICD-10-CM | POA: Diagnosis not present

## 2023-08-28 DIAGNOSIS — H43813 Vitreous degeneration, bilateral: Secondary | ICD-10-CM | POA: Diagnosis not present

## 2023-08-28 DIAGNOSIS — H35372 Puckering of macula, left eye: Secondary | ICD-10-CM | POA: Diagnosis not present

## 2023-08-28 DIAGNOSIS — H353211 Exudative age-related macular degeneration, right eye, with active choroidal neovascularization: Secondary | ICD-10-CM | POA: Diagnosis not present

## 2023-08-28 DIAGNOSIS — H353223 Exudative age-related macular degeneration, left eye, with inactive scar: Secondary | ICD-10-CM | POA: Diagnosis not present

## 2023-08-28 DIAGNOSIS — Z7901 Long term (current) use of anticoagulants: Secondary | ICD-10-CM

## 2023-08-28 LAB — POCT INR: INR: 2 (ref 2.0–3.0)

## 2023-08-28 NOTE — Patient Instructions (Addendum)
 Pre visit review using our clinic review tool, if applicable. No additional management support is needed unless otherwise documented below in the visit note.  Continue 2.5 mg daily except take 1.25 mg on Fridays. Recheck in 4 weeks.

## 2023-08-28 NOTE — Progress Notes (Signed)
 Continue 2.5 mg daily except take 1.25 mg on Fridays. Recheck in 4 weeks.

## 2023-08-29 ENCOUNTER — Emergency Department (HOSPITAL_COMMUNITY)

## 2023-08-29 ENCOUNTER — Other Ambulatory Visit: Payer: Self-pay

## 2023-08-29 ENCOUNTER — Emergency Department (HOSPITAL_COMMUNITY)
Admission: EM | Admit: 2023-08-29 | Discharge: 2023-08-29 | Disposition: A | Discharge status: Home / Self Care | Attending: Emergency Medicine | Admitting: Emergency Medicine

## 2023-08-29 DIAGNOSIS — M47812 Spondylosis without myelopathy or radiculopathy, cervical region: Secondary | ICD-10-CM | POA: Diagnosis not present

## 2023-08-29 DIAGNOSIS — S40811A Abrasion of right upper arm, initial encounter: Secondary | ICD-10-CM | POA: Insufficient documentation

## 2023-08-29 DIAGNOSIS — M4312 Spondylolisthesis, cervical region: Secondary | ICD-10-CM | POA: Diagnosis not present

## 2023-08-29 DIAGNOSIS — Z8673 Personal history of transient ischemic attack (TIA), and cerebral infarction without residual deficits: Secondary | ICD-10-CM | POA: Diagnosis not present

## 2023-08-29 DIAGNOSIS — S0081XA Abrasion of other part of head, initial encounter: Secondary | ICD-10-CM | POA: Diagnosis not present

## 2023-08-29 DIAGNOSIS — N189 Chronic kidney disease, unspecified: Secondary | ICD-10-CM | POA: Diagnosis not present

## 2023-08-29 DIAGNOSIS — W19XXXA Unspecified fall, initial encounter: Secondary | ICD-10-CM

## 2023-08-29 DIAGNOSIS — W1830XA Fall on same level, unspecified, initial encounter: Secondary | ICD-10-CM | POA: Insufficient documentation

## 2023-08-29 DIAGNOSIS — I700 Atherosclerosis of aorta: Secondary | ICD-10-CM | POA: Diagnosis not present

## 2023-08-29 DIAGNOSIS — S0083XA Contusion of other part of head, initial encounter: Secondary | ICD-10-CM | POA: Diagnosis not present

## 2023-08-29 DIAGNOSIS — Z043 Encounter for examination and observation following other accident: Secondary | ICD-10-CM | POA: Diagnosis not present

## 2023-08-29 DIAGNOSIS — I10 Essential (primary) hypertension: Secondary | ICD-10-CM | POA: Diagnosis not present

## 2023-08-29 DIAGNOSIS — I129 Hypertensive chronic kidney disease with stage 1 through stage 4 chronic kidney disease, or unspecified chronic kidney disease: Secondary | ICD-10-CM | POA: Insufficient documentation

## 2023-08-29 DIAGNOSIS — Z7982 Long term (current) use of aspirin: Secondary | ICD-10-CM | POA: Insufficient documentation

## 2023-08-29 DIAGNOSIS — Z79899 Other long term (current) drug therapy: Secondary | ICD-10-CM | POA: Diagnosis not present

## 2023-08-29 DIAGNOSIS — Z7901 Long term (current) use of anticoagulants: Secondary | ICD-10-CM | POA: Insufficient documentation

## 2023-08-29 DIAGNOSIS — S0990XA Unspecified injury of head, initial encounter: Secondary | ICD-10-CM | POA: Diagnosis present

## 2023-08-29 NOTE — ED Provider Notes (Signed)
 [place] EMERGENCY DEPARTMENT AT [facility] Provider Note   CSN: [id number] Arrival date & time: 08/29/23  8657     History  Chief Complaint  Patient presents with   Marletta Lor    Judy [name] is a 89 y.o. female.  The history is provided by the patient, medical records and a relative. No language interpreter was used.  Fall This is a new problem. The current episode started 6 to 12 hours ago. The problem occurs rarely. The problem has not changed since onset.Associated symptoms include headaches (resolved now). Pertinent negatives include no chest pain, no abdominal pain and no shortness of breath. Nothing aggravates the symptoms. Nothing relieves the symptoms. She has tried nothing for the symptoms. The treatment provided no relief.       Home Medications Prior to Admission medications   Medication Sig Start Date End Date Taking? Authorizing Provider  acetaminophen (TYLENOL) 500 MG tablet Take 500 mg by mouth every 6 (six) hours as needed.    [provider]  aspirin 81 MG tablet Take 81 mg by mouth daily.    [provider]  azelastine (ASTELIN) 0.1 % nasal spray Place 2 sprays into the nose 2 (two) times daily as needed for rhinitis or allergies.    [provider]  b complex vitamins tablet Take 1 tablet by mouth daily. 08/07/16   Pincus Sanes, MD  diltiazem (CARDIZEM CD) 120 MG 24 hr capsule TAKE 1 CAPSULE BY MOUTH EVERY DAY 08/14/23   Wendall Stade, MD  furosemide (LASIX) 20 MG tablet TAKE 1 TABLET BY MOUTH EVERY DAY 06/20/23   Wendall Stade, MD  isosorbide mononitrate (IMDUR) 30 MG 24 hr tablet TAKE 1 TABLET BY MOUTH EVERYDAY AT BEDTIME 06/20/23   Wendall Stade, MD  lisinopril (ZESTRIL) 10 MG tablet TAKE 1 TABLET BY MOUTH EVERY DAY 06/26/23   Wendall Stade, MD  metoprolol succinate (TOPROL-XL) 50 MG 24 hr tablet TAKE 1 TABLET BY MOUTH EVERY DAY WITH OR IMMEDIATELY FOLLOWING A MEAL 06/20/23   Wendall Stade, MD  Multiple Vitamin  (MULTIVITAMIN WITH MINERALS) TABS tablet Take 1 tablet by mouth daily.    [provider]  nitroGLYCERIN (NITROSTAT) 0.4 MG SL tablet PLACE 1 TABLET UNDER TONGUE EVERY 5 MINUTES AS NEEDED FOR CHEST PAIN 08/22/23   Pincus Sanes, MD  potassium chloride (MICRO-K) 10 MEQ CR capsule TAKE 1 CAPSULE BY MOUTH EVERY DAY 08/14/23   Wendall Stade, MD  rosuvastatin (CRESTOR) 10 MG tablet TAKE 1 TABLET BY MOUTH ONCE DAILY 06/25/23   Wendall Stade, MD  sodium chloride (OCEAN) 0.65 % SOLN nasal spray Place 1 spray into both nostrils as needed for congestion.    [provider]  tobramycin (TOBREX) 0.3 % ophthalmic solution PLEASE SEE ATTACHED FOR DETAILED DIRECTIONS 05/19/22   [provider]  warfarin (COUMADIN) 1 MG tablet TAKE 1 TABLET BY MOUTH MONDAYS, WEDNESDAYS, FRIDAYS AND SATURDAYS OR AS DIRECTED BY ANTICOAGULATION CLINIC. 10/04/22   Pincus Sanes, MD  warfarin (COUMADIN) 2.5 MG tablet TAKE 1 TABLET BY MOUTH DAILY EXCEPT TAKE 1/2 TABLET ON FRIDAYS OR AS DIRECTED BY ANTICOAGULATION CLINIC 06/25/23   Pincus Sanes, MD      Allergies    Betadine [povidone iodine], Betadine [povidone-iodine], and Methocarbamol    Review of Systems   Review of Systems  Constitutional:  Negative for chills, fatigue and fever.  HENT:  Negative for congestion.   Respiratory:  Negative for cough,  chest tightness, shortness of breath and wheezing.   Cardiovascular:  Positive for palpitations (chronic intermittent). Negative for chest pain and leg swelling.  Gastrointestinal:  Negative for abdominal pain, constipation, diarrhea, nausea and vomiting.  Genitourinary:  Negative for dysuria, flank pain and frequency.  Musculoskeletal:  Negative for back pain, neck pain and neck stiffness.  Skin:  Positive for wound (abrasion to forehead). Negative for rash.  Neurological:  Positive for headaches (resolved now). Negative for dizziness, weakness, light-headedness and numbness.  Psychiatric/Behavioral:   Negative for agitation.   All other systems reviewed and are negative.   Physical Exam Updated Vital Signs BP 130/68   Pulse 74   Temp 98.4 F (36.9 C) (Oral)   Resp 17   Ht 5\' 3"  (1.6 m)   Wt 61.2 kg   SpO2 95%   BMI 23.91 kg/m  Physical Exam Vitals and nursing note reviewed.  Constitutional:      General: She is not in acute distress.    Appearance: She is well-developed. She is not ill-appearing, toxic-appearing or diaphoretic.  HENT:     Head: Atraumatic.     Nose: No congestion or rhinorrhea.     Mouth/Throat:     Mouth: Mucous membranes are moist.  Eyes:     Extraocular Movements: Extraocular movements intact.     Conjunctiva/sclera: Conjunctivae normal.     Pupils: Pupils are equal, round, and reactive to light.  Cardiovascular:     Rate and Rhythm: Normal rate and regular rhythm.     Heart sounds: No murmur heard. Pulmonary:     Effort: Pulmonary effort is normal. No respiratory distress.     Breath sounds: Normal breath sounds. No wheezing, rhonchi or rales.  Chest:     Chest wall: No tenderness.  Abdominal:     General: Abdomen is flat.     Palpations: Abdomen is soft.     Tenderness: There is no abdominal tenderness. There is no right CVA tenderness, left CVA tenderness, guarding or rebound.  Musculoskeletal:        General: Tenderness present. No swelling.     Cervical back: Neck supple.     Right lower leg: No edema.     Left lower leg: No edema.  Skin:    General: Skin is warm and dry.     Capillary Refill: Capillary refill takes less than 2 seconds.     Findings: Bruising present. No erythema or rash.  Neurological:     General: No focal deficit present.     Mental Status: She is alert.     Sensory: No sensory deficit.     Motor: No weakness.  Psychiatric:        Mood and Affect: Mood normal.     ED Results / Procedures / Treatments   Labs (all labs ordered are listed, but only abnormal results are displayed) Labs Reviewed - No data to  display  EKG EKG Interpretation Date/Time:  Wednesday August 29 2023 10:15:15 EDT Ventricular Rate:  73 PR Interval:    QRS Duration:  110 QT Interval:  404 QTC Calculation: 446 R Axis:   31  Text Interpretation: Atrial fibrillation Inferior infarct, age indeterminate Lateral leads are also involved when compared to prior, similar afib. No STEMI Confirmed by Theda Belfast ([id number]) on 08/29/2023 11:03:42 AM  Radiology CT Cervical Spine Wo Contrast Result Date: 08/29/2023 CLINICAL DATA:  89 year old female status post fall at midnight. Struck left side of head. EXAM: CT CERVICAL SPINE WITHOUT CONTRAST TECHNIQUE:  Multidetector CT imaging of the cervical spine was performed without intravenous contrast. Multiplanar CT image reconstructions were also generated. RADIATION DOSE REDUCTION: This exam was performed according to the departmental dose-optimization program which includes automated exposure control, adjustment of the mA and/or kV according to patient size and/or use of iterative reconstruction technique. COMPARISON:  Head CT today. FINDINGS: Alignment: Straightening of cervical lordosis, mild degenerative appearing anterolisthesis of C2 on C3. Bilateral posterior element alignment is within normal limits. Cervicothoracic junction alignment is within normal limits. Skull base and vertebrae: Visualized skull base is intact. No atlanto-occipital dissociation. C1 and C2 appear chronically degenerated, but intact and aligned. No acute osseous abnormality identified. Soft tissues and spinal canal: No prevertebral fluid or swelling. No visible canal hematoma. Negative visible noncontrast neck soft tissues aside from calcified carotid atherosclerosis. Disc levels: Chronic severe C1-C2 degeneration including near bone-on-bone appearance bilaterally. Superimposed advanced chronic cervical facet arthropathy on the left especially at C2-C3. Advanced chronic disc and endplate degeneration at the other cervical  levels. But probably only mild associated cervical spinal stenosis. Upper chest: Visible upper thoracic levels appear intact. Calcified aortic atherosclerosis. Negative lung apices. IMPRESSION: 1. No acute traumatic injury identified in the cervical spine. 2. Advanced chronic cervical spine degeneration. Electronically Signed   By: Odessa Fleming M.D.   On: 08/29/2023 11:15   CT HEAD WO CONTRAST ( ) Result Date: 08/29/2023 CLINICAL DATA:  89 year old female status post fall at midnight. Struck left side of head. EXAM: CT HEAD WITHOUT CONTRAST TECHNIQUE: Contiguous axial images were obtained from the base of the skull through the vertex without intravenous contrast. RADIATION DOSE REDUCTION: This exam was performed according to the departmental dose-optimization program which includes automated exposure control, adjustment of the mA and/or kV according to patient size and/or use of iterative reconstruction technique. COMPARISON:  Brain MRI and Head CT06/18/2018. FINDINGS: Brain: Cerebral volume loss since 2018 appears generalized. No midline shift, mass effect, or evidence of intracranial mass lesion. No ventriculomegaly. Chronic right superior cerebellar infarct is stable. Stable gray-white matter differentiation throughout the brain. No acute intracranial hemorrhage identified. Vascular: No suspicious intracranial vascular hyperdensity. Calcified atherosclerosis at the skull base. Skull: No fracture identified. Sinuses/Orbits: Small and low-density fluid level in the left maxillary sinus appears likely to be inflammatory. Other Visualized paranasal sinuses and mastoids are stable and well aerated. Other: No orbit or scalp acute soft tissue injury identified. IMPRESSION: No acute intracranial abnormality or acute traumatic injury identified. Electronically Signed   By: Odessa Fleming M.D.   On: 08/29/2023 11:13    Procedures Procedures    Medications Ordered in ED Medications - No data to display  ED Course/  Medical Decision Making/ A&P                                 Medical Decision Making Amount and/or Complexity of Data Reviewed Radiology: ordered.    CHANTEE CERINO is a 89 y.o. female with a past medical history significant for hypertension, hyperlipidemia, carotid disease, atrial fibrillation on Coumadin therapy, CKD, gout, and previous stroke who presents with a fall.  According to patient, she had gotten up to go to the bathroom last night around midnight and was twisting around to turn around quickly and twisted all the way down to the ground.  She reports she hit her left forehead on the ground and did get knocked out briefly.  She reports a mild headache but that has  since resolved.  Denies significant headache or neck pain now.  Reports she had some bruising and swelling to her left forehead and face but minimal pain now.  She denies any nausea, vomiting, vision changes.  Denies numbness, tingling, weakness of extremities.  She reports some palpitations but she intermittently has A-fib and recently had her Coumadin level checked yesterday.  She is on Coumadin.  She says that otherwise she is feeling well and is seen multiple doctors over the last 2 weeks with reassuring workups.  She otherwise says she was feeling well then and is feeling now.  On my exam, lungs are clear.  Chest is nontender.  Abdomen nontender.  She has a mild abrasion to her right forearm and she is right-handed.  Symmetric grip strength, pulses, and sensation.  Legs are nontender and nonedematous.  No tenderness in the neck or back.  No other focal neurologic deficits.  Patient well-appearing.  Some bruising on her left forehead.  Patient is wearing make-up and is somewhat hiding the contusion that is still present.  She reports she did have a small burn to her left forehead from a curling iron the other day.  We had a shared decision-making conversation with patient and daughter.  We offered to do more intensive lab  testing as she was having some palpitations and appears to be in A-fib however patient said given her recent workup and normal, level yesterday, she does not want a large medical workup.  She agrees with getting a CT of the head and neck given her blood thinner use and hitting her head but otherwise she does not want extensive labs at this time.  Given her well appearance this was felt to be reasonable.  Her vital signs are reassuring.  Anticipate reassessment after workup to determine disposition.  CT of the head and neck were reassuring.  She has arthritis which we discussed but otherwise no acute fracture or bleed.  Patient heart rate is in the 70s and EKG appears similar to prior.  Patient is feeling well and would like to go home.  Patient will be careful with her ambulation and will use her walker and cane more frequently.  We also discussed outpatient follow-up with PCP to discuss PT evaluation and ambulation assistance.  Patient and family agree with plan for discharge home and she will be discharged for outpatient follow-up.          Final Clinical Impression(s) / ED Diagnoses Final diagnoses:  Fall, initial encounter  Contusion of face, initial encounter  Abrasion of right upper extremity, initial encounter    Rx / DC Orders ED Discharge Orders     None       Clinical Impression: 1. Fall, initial encounter   2. Contusion of face, initial encounter   3. Abrasion of right upper extremity, initial encounter     Disposition: Discharge  Condition: Good  I have discussed the results, Dx and Tx plan with the pt(& family if present). He/she/they expressed understanding and agree(s) with the plan. Discharge instructions discussed at great length. Strict return precautions discussed and pt &/or family have verbalized understanding of the instructions. No further questions at time of discharge.    New Prescriptions   No medications on file    Follow Up: Pincus Sanes, MD [address] [place] [address] [phone]     [facility] Emergency Department at [facility] [address] [place] [address] [phone]  Caron Ode, Canary Brim, MD 08/29/23 1225

## 2023-08-29 NOTE — ED Triage Notes (Signed)
 Pt states she lost her balance about midnight, going to the bathroom, fell on the carpet, hitting left side of her head and abrasion noted to the left lower arm. Pt states she was having palpitations so she took a nitroglycerin. Pt states she is on warfarin. Denies LOC

## 2023-08-29 NOTE — Discharge Instructions (Signed)
 Your history, exam, and workup today revealed no evidence of acute fracture or intracranial hemorrhage after your fall overnight.  We got the imaging because you are on blood thinners and did lose consciousness briefly.  I suspect you have some soft tissue injuries in your arm and her forehead but no evidence of acute fracture.  Your exam was also reassuring.  We had a shared decision-making conversation and agreed to hold on extensive lab work given your recent medical management by her other doctors and recent reassuring workup.  Please rest and stay hydrated and follow-up with your doctor and discuss ambulation assist devices and possibly outpatient physical therapy as well.  If any symptoms change or worsen acutely, please return to the nearest emergency department.

## 2023-08-30 ENCOUNTER — Telehealth: Payer: Self-pay

## 2023-08-30 NOTE — Transitions of Care (Post Inpatient/ED Visit) (Signed)
 08/30/2023  Name: Judy [name] MRN: [id number] DOB: December 15, 1930  Today's TOC FU Call Status: Today's TOC FU Call Status:: Successful TOC FU Call Completed TOC FU Call Complete Date: 08/30/23 Patient's Name and Date of Birth confirmed.  Transition Care Management Follow-up Telephone Call Date of Discharge: 08/29/23 Discharge Facility: [facility]) Type of Discharge: Emergency Department Reason for ED Visit: Other: (fall) How have you been since you were released from the hospital?: Better Any questions or concerns?: No  Items Reviewed: Did you receive and understand the discharge instructions provided?: Yes Medications obtained,verified, and reconciled?: Yes (Medications Reviewed) Any new allergies since your discharge?: No Dietary orders reviewed?: NA Do you have support at home?: Yes People in Home: child(ren), adult  Medications Reviewed Today: Medications Reviewed Today     Reviewed by Karena Addison, LPN (Licensed Practical Nurse) on 08/30/23 at 1158  Med List Status: <None>   Medication Order Taking? Sig Documenting Provider Last Dose Status Informant  acetaminophen (TYLENOL) 500 MG tablet [id number] No Take 500 mg by mouth every 6 (six) hours as needed for mild pain (pain score 1-3) or moderate pain (pain score 4-6). [provider] Past Month Active Self  aspirin 81 MG tablet [id number] No Take 81 mg by mouth daily. [provider] 08/28/2023 Active Self  azelastine (ASTELIN) 0.1 % nasal spray [id number] No Place 2 sprays into the nose 2 (two) times daily as needed for rhinitis or allergies. [provider] Past Month Active Self  b complex vitamins tablet [id number] No Take 1 tablet by mouth daily. Pincus Sanes, MD 08/28/2023 Active Self  diltiazem (CARDIZEM CD) 120 MG 24 hr capsule [id number] No TAKE 1 CAPSULE BY MOUTH EVERY DAY  Patient taking differently: Take 120 mg by mouth daily.   Wendall Stade, MD 08/28/2023 Active   furosemide  (LASIX) 20 MG tablet [id number] No TAKE 1 TABLET BY MOUTH EVERY DAY Wendall Stade, MD 08/28/2023 Active   isosorbide mononitrate (IMDUR) 30 MG 24 hr tablet [id number] No TAKE 1 TABLET BY MOUTH EVERYDAY AT BEDTIME  Patient taking differently: Take 30 mg by mouth daily.   Wendall Stade, MD Taking Active   lisinopril (ZESTRIL) 10 MG tablet [id number] No TAKE 1 TABLET BY MOUTH EVERY DAY  Patient taking differently: Take 10 mg by mouth at bedtime.   Wendall Stade, MD 08/28/2023 Active   metoprolol succinate (TOPROL-XL) 50 MG 24 hr tablet [id number] No TAKE 1 TABLET BY MOUTH EVERY DAY WITH OR IMMEDIATELY FOLLOWING A MEAL  Patient taking differently: Take 50 mg by mouth daily.   Wendall Stade, MD 08/28/2023 Active   Multiple Vitamin (MULTIVITAMIN WITH MINERALS) TABS tablet [id number] No Take 1 tablet by mouth daily. [provider] 08/28/2023 Active Self  nitroGLYCERIN (NITROSTAT) 0.4 MG SL tablet [id number] No PLACE 1 TABLET UNDER TONGUE EVERY 5 MINUTES AS NEEDED FOR CHEST PAIN  Patient taking differently: Place 0.4 mg under the tongue every 5 (five) minutes as needed for chest pain.   Pincus Sanes, MD 08/28/2023 Active   potassium chloride (MICRO-K) 10 MEQ CR capsule [id number] No TAKE 1 CAPSULE BY MOUTH EVERY DAY  Patient taking differently: Take 10 mEq by mouth daily.   Wendall Stade, MD 08/28/2023 Active   rosuvastatin (CRESTOR) 10 MG tablet [id number] No TAKE 1 TABLET BY MOUTH ONCE DAILY  Patient taking differently: Take 10 mg by mouth at bedtime.   Wendall Stade, MD 08/28/2023 Active   sodium chloride (OCEAN) 0.65 %  SOLN nasal spray [id number] No Place 1 spray into both nostrils as needed for congestion. [provider] Past Month Active Self  tobramycin (TOBREX) 0.3 % ophthalmic solution [id number] No Place 1 drop into the right eye every 4 (four) hours. [provider] 08/28/2023 Active   warfarin (COUMADIN) 1 MG tablet [id number] No TAKE 1 TABLET BY MOUTH MONDAYS,  WEDNESDAYS, FRIDAYS AND SATURDAYS OR AS DIRECTED BY ANTICOAGULATION CLINIC.  Patient taking differently: Take 1 mg by mouth daily as needed (As directed by anticoagulation clinic).   Pincus Sanes, MD Unknown Active   warfarin (COUMADIN) 2.5 MG tablet [id number] No TAKE 1 TABLET BY MOUTH DAILY EXCEPT TAKE 1/2 TABLET ON FRIDAYS OR AS DIRECTED BY ANTICOAGULATION CLINIC  Patient taking differently: Take 1.25-2.5 mg by mouth See admin instructions. Take one tablet by mouth daily except take half a tablet on Fridays or as directed by anticoagulation clinic.   Pincus Sanes, MD 08/28/2023  6:00 PM Active             Home Care and Equipment/Supplies: Were Home Health Services Ordered?: NA Any new equipment or medical supplies ordered?: NA  Functional Questionnaire: Do you need assistance with bathing/showering or dressing?: No Do you need assistance with meal preparation?: No Do you need assistance with eating?: No Do you have difficulty maintaining continence: No Do you need assistance with getting out of bed/getting out of a chair/moving?: No Do you have difficulty managing or taking your medications?: No  Follow up appointments reviewed: PCP Follow-up appointment confirmed?: No (declined appt) MD Provider Line [phone] Given: No Specialist Hospital Follow-up appointment confirmed?: NA Do you need transportation to your follow-up appointment?: No Do you understand care options if your condition(s) worsen?: Yes-patient verbalized understanding    SIGNATURE Karena Addison, LPN [facility] Nurse [facility] Advisor Direct Dial [phone]

## 2023-09-25 ENCOUNTER — Ambulatory Visit (INDEPENDENT_AMBULATORY_CARE_PROVIDER_SITE_OTHER)

## 2023-09-25 DIAGNOSIS — Z7901 Long term (current) use of anticoagulants: Secondary | ICD-10-CM

## 2023-09-25 LAB — POCT INR: INR: 2.4 (ref 2.0–3.0)

## 2023-09-25 NOTE — Patient Instructions (Addendum)
 Pre visit review using our clinic review tool, if applicable. No additional management support is needed unless otherwise documented below in the visit note.  Continue 2.5 mg daily except take 1.25 mg on Fridays. Recheck in 4 weeks.

## 2023-09-25 NOTE — Progress Notes (Signed)
 Continue 2.5 mg daily except take 1.25 mg on Fridays. Recheck in 4 weeks.

## 2023-10-23 ENCOUNTER — Ambulatory Visit (INDEPENDENT_AMBULATORY_CARE_PROVIDER_SITE_OTHER)

## 2023-10-23 DIAGNOSIS — Z7901 Long term (current) use of anticoagulants: Secondary | ICD-10-CM

## 2023-10-23 LAB — POCT INR: INR: 3.1 — AB (ref 2.0–3.0)

## 2023-10-23 NOTE — Patient Instructions (Addendum)
 Pre visit review using our clinic review tool, if applicable. No additional management support is needed unless otherwise documented below in the visit note.  Hold dose today and then continue 2.5 mg daily except take 1.25 mg on Fridays. Recheck in 1 weeks.

## 2023-10-23 NOTE — Progress Notes (Signed)
 Pt reports feeling "under the weather, maybe sinuses." She reports she has been using mucinex for congestion. No interaction with warfarin. Since today is a full tablet dose will only hold dose today and then resume normal dosing. Pt has been very consistent on the dose for the last 4 INR checks.  Hold dose today and then continue 2.5 mg daily except take 1.25 mg on Fridays. Recheck in 1 weeks.

## 2023-10-30 ENCOUNTER — Ambulatory Visit (INDEPENDENT_AMBULATORY_CARE_PROVIDER_SITE_OTHER)

## 2023-10-30 DIAGNOSIS — Z7901 Long term (current) use of anticoagulants: Secondary | ICD-10-CM | POA: Diagnosis not present

## 2023-10-30 LAB — POCT INR: INR: 2.9 (ref 2.0–3.0)

## 2023-10-30 NOTE — Patient Instructions (Addendum)
 Pre visit review using our clinic review tool, if applicable. No additional management support is needed unless otherwise documented below in the visit note.  Decrease dose today to take 1.25 mg and then change weekly dose to take 2.5 mg daily except take 1.25 mg on Monday, Wednesday and Fridays. Recheck in 3 weeks.

## 2023-10-30 NOTE — Progress Notes (Signed)
 Pt reports feeling "under the weather, maybe sinuses." She reports she has been using mucinex for congestion. No interaction with warfarin. Decrease dose today to take 1.25 mg and then change weekly dose to take 2.5 mg daily except take 1.25 mg on Monday, Wednesday and Fridays. Recheck in 3 weeks.

## 2023-11-12 ENCOUNTER — Other Ambulatory Visit: Payer: Self-pay

## 2023-11-12 MED ORDER — ISOSORBIDE MONONITRATE ER 30 MG PO TB24: ORAL_TABLET | ORAL | 2 refills | Status: AC

## 2023-11-12 MED ORDER — METOPROLOL SUCCINATE ER 50 MG PO TB24: ORAL_TABLET | ORAL | 2 refills | Status: AC

## 2023-11-12 MED ORDER — FUROSEMIDE 20 MG PO TABS: 20.0000 mg | ORAL_TABLET | Freq: Every day | ORAL | 2 refills | Status: AC

## 2023-11-13 DIAGNOSIS — H353211 Exudative age-related macular degeneration, right eye, with active choroidal neovascularization: Secondary | ICD-10-CM | POA: Diagnosis not present

## 2023-11-13 DIAGNOSIS — H04123 Dry eye syndrome of bilateral lacrimal glands: Secondary | ICD-10-CM | POA: Diagnosis not present

## 2023-11-13 DIAGNOSIS — H43813 Vitreous degeneration, bilateral: Secondary | ICD-10-CM | POA: Diagnosis not present

## 2023-11-13 DIAGNOSIS — H35372 Puckering of macula, left eye: Secondary | ICD-10-CM | POA: Diagnosis not present

## 2023-11-13 DIAGNOSIS — H353223 Exudative age-related macular degeneration, left eye, with inactive scar: Secondary | ICD-10-CM | POA: Diagnosis not present

## 2023-11-20 ENCOUNTER — Ambulatory Visit (INDEPENDENT_AMBULATORY_CARE_PROVIDER_SITE_OTHER)

## 2023-11-20 DIAGNOSIS — Z7901 Long term (current) use of anticoagulants: Secondary | ICD-10-CM

## 2023-11-20 LAB — POCT INR: INR: 2.2 (ref 2.0–3.0)

## 2023-11-20 NOTE — Patient Instructions (Addendum)
 Pre visit review using our clinic review tool, if applicable. No additional management support is needed unless otherwise documented below in the visit note.  Continue 2.5 mg daily except take 1.25 mg on Monday, Wednesday and Fridays. Recheck in 3 weeks.

## 2023-11-20 NOTE — Progress Notes (Signed)
 Continue 2.5 mg daily except take 1.25 mg on Monday, Wednesday and Fridays. Recheck in 3 weeks.

## 2023-12-04 ENCOUNTER — Ambulatory Visit (INDEPENDENT_AMBULATORY_CARE_PROVIDER_SITE_OTHER)

## 2023-12-04 VITALS — BP 122/62 | HR 78 | Ht 64.0 in | Wt 140.2 lb

## 2023-12-04 DIAGNOSIS — Z0000 Encounter for general adult medical examination without abnormal findings: Secondary | ICD-10-CM | POA: Diagnosis not present

## 2023-12-04 NOTE — Patient Instructions (Signed)
 Ms. Knodel , Thank you for taking time out of your busy schedule to complete your Annual Wellness Visit with me. I enjoyed our conversation and look forward to speaking with you again next year. I, as well as your care team,  appreciate your ongoing commitment to your health goals. Please review the following plan we discussed and let me know if I can assist you in the future. Your Game plan/ To Do List    Follow up Visits: Next Medicare AWV with our clinical staff: 12/09/2024   Have you seen your provider in the last 6 months (3 months if uncontrolled diabetes)? Yes Next Office Visit with your provider: 02/26/2024  Clinician Recommendations:  Aim for 30 minutes of exercise or brisk walking, 6-8 glasses of water, and 5 servings of fruits and vegetables each day. Educated and advised on getting the Tdap (Tetenus) and Shingles vaccines in 2025 at local pharmacy.      This is a list of the screening recommended for you and due dates:  Health Maintenance  Topic Date Due   Eye exam for diabetics  Never done   DTaP/Tdap/Td vaccine (1 - Tdap) Never done   Zoster (Shingles) Vaccine (1 of 2) 08/03/1982   Complete foot exam   02/16/2021   COVID-19 Vaccine (3 - 2024-25 season) 02/11/2023   Flu Shot  01/11/2024   Hemoglobin A1C  02/13/2024   Medicare Annual Wellness Visit  12/03/2024   Pneumococcal Vaccine for age over 10  Completed   DEXA scan (bone density measurement)  Completed   Hepatitis B Vaccine  Aged Out   HPV Vaccine  Aged Out   Meningitis B Vaccine  Aged Out    Advanced directives: (Copy Requested) Please bring a copy of your health care power of attorney and living will to the office to be added to your chart at your convenience. You can mail to [facility] [address] [place], [place] [address] or email to [email or web address] Advance Care Planning is important because it:  [x]  Makes sure you receive the medical care that is consistent with your values,  goals, and preferences  [x]  It provides guidance to your family and loved ones and reduces their decisional burden about whether or not they are making the right decisions based on your wishes.  Follow the link provided in your after visit summary or read over the paperwork we have mailed to you to help you started getting your Advance Directives in place. If you need assistance in completing these, please reach out to us  so that we can help you!

## 2023-12-04 NOTE — Progress Notes (Addendum)
 Subjective:   Judy [name] is a 89 y.o. who presents for a Medicare Wellness preventive visit.  As a reminder, Annual Wellness Visits don't include a physical exam, and some assessments may be limited, especially if this visit is performed virtually. We may recommend an in-person follow-up visit with your provider if needed.  Visit Complete: In person  Persons Participating in Visit: Patient.  AWV Questionnaire: No: Patient Medicare AWV questionnaire was not completed prior to this visit.  Cardiac Risk Factors include: advanced age (>11men, >85 women);diabetes mellitus;dyslipidemia;hypertension     Objective:    Today's Vitals   12/04/23 0907  BP: 122/62  Pulse: 78  SpO2: 98%  Weight: 140 lb 3.2 oz (63.6 kg)  Height: 5' 4 (1.626 m)   Body mass index is 24.07 kg/m.     12/04/2023    9:06 AM 05/02/2021    2:41 PM 02/17/2020   10:12 AM 05/15/2017    5:16 PM 11/27/2016    6:42 AM  Advanced Directives  Does Patient Have a Medical Advance Directive? Yes Yes Yes No  Yes   Type of Estate agent of [place];Living will Healthcare Power of [place];Living will   Living will  Does patient want to make changes to medical advance directive?   No - Patient declined  No - Patient declined   Copy of Healthcare Power of Attorney in Chart? No - copy requested No - copy requested     Would patient like information on creating a medical advance directive?    Yes (ED - Information included in AVS)       Data saved with a previous flowsheet row definition    Current Medications (verified) Outpatient Encounter Medications as of 12/04/2023  Medication Sig   aspirin 81 MG tablet Take 81 mg by mouth daily.   azelastine  (ASTELIN ) 0.1 % nasal spray Place 2 sprays into the nose 2 (two) times daily as needed for rhinitis or allergies.   b complex vitamins tablet Take 1 tablet by mouth daily.   diltiazem  (CARDIZEM  CD) 120 MG 24 hr capsule TAKE 1 CAPSULE BY MOUTH EVERY DAY    furosemide  (LASIX ) 20 MG tablet Take 1 tablet (20 mg total) by mouth daily.   isosorbide  mononitrate (IMDUR ) 30 MG 24 hr tablet TAKE 1 TABLET BY MOUTH EVERYDAY AT BEDTIME   lisinopril  (ZESTRIL ) 10 MG tablet TAKE 1 TABLET BY MOUTH EVERY DAY   loratadine (CLARITIN) 10 MG tablet Take 10 mg by mouth daily.   metoprolol  succinate (TOPROL -XL) 50 MG 24 hr tablet TAKE 1 TABLET BY MOUTH EVERY DAY WITH OR IMMEDIATELY FOLLOWING A MEAL   Multiple Vitamin (MULTIVITAMIN WITH MINERALS) TABS tablet Take 1 tablet by mouth daily.   nitroGLYCERIN  (NITROSTAT ) 0.4 MG SL tablet PLACE 1 TABLET UNDER TONGUE EVERY 5 MINUTES AS NEEDED FOR CHEST PAIN   potassium chloride  (MICRO-K ) 10 MEQ CR capsule TAKE 1 CAPSULE BY MOUTH EVERY DAY   rosuvastatin  (CRESTOR ) 10 MG tablet TAKE 1 TABLET BY MOUTH ONCE DAILY   sodium chloride (OCEAN) 0.65 % SOLN nasal spray Place 1 spray into both nostrils as needed for congestion.   tobramycin (TOBREX) 0.3 % ophthalmic solution Place 1 drop into the right eye every 4 (four) hours.   warfarin (COUMADIN ) 1 MG tablet TAKE 1 TABLET BY MOUTH MONDAYS, WEDNESDAYS, FRIDAYS AND SATURDAYS OR AS DIRECTED BY ANTICOAGULATION CLINIC.   warfarin (COUMADIN ) 2.5 MG tablet TAKE 1 TABLET BY MOUTH DAILY EXCEPT TAKE 1/2 TABLET ON FRIDAYS OR AS  DIRECTED BY ANTICOAGULATION CLINIC   acetaminophen (TYLENOL) 500 MG tablet Take 500 mg by mouth every 6 (six) hours as needed for mild pain (pain score 1-3) or moderate pain (pain score 4-6).   No facility-administered encounter medications on file as of 12/04/2023.    Allergies (verified) Betadine [povidone iodine] and Methocarbamol    History: Past Medical History:  Diagnosis Date   Atrial fibrillation (HCC)    a. chronic anticoag, failed prior DCCV;  b. 03/2010 Echo: EF 55-65%, mod MR, mildly to mod dil LA, mod dil RA, mild to mod MR.   CAD (coronary artery disease)    a. BMS to RCA 96';  b. Stent PDA and OM DES 2006   CAROTID ARTERY DISEASE    a. 11/2011 Carotid  U/S:  0-39% bilat dzs.   HTN (hypertension)    Hyperlipidemia    LBBB (left bundle branch block)    Macular degeneration, wet (HCC) 10/2012 dx   L>R, follows with retinal specialist->injections monthly.   Obesity    Vertigo    Past Surgical History:  Procedure Laterality Date   CORONARY STENT PLACEMENT  1996, 2006 x2   Drug-eluting stent placement to the PDA, drug-eluting stent  placement to the first obtuse  marginal, StarClose closure of the right common femoral arteriotomy site. Successful drug-eluting stent  placement in  both the posterior descending artery and the obtuse marginal. The lesion was directly stented using a 2.5 x 16 mm Taxus deployed at 14 atmospheres.    EYE SURGERY     Tear duct stent   Family History  Problem Relation Age of Onset   Arthritis Mother        died in her 70's   Arthritis Father        died in MVA.   CAD Brother        deceased   CAD Brother        deceased   CAD Sister        deceased   Social History   Socioeconomic History   Marital status: Widowed    Spouse name: Not on file   Number of children: Not on file   Years of education: Not on file   Highest education level: Not on file  Occupational History   Not on file  Tobacco Use   Smoking status: Never   Smokeless tobacco: Never  Vaping Use   Vaping status: Never Used  Substance and Sexual Activity   Alcohol use: No   Drug use: No   Sexual activity: Not Currently  Other Topics Concern   Not on file  Social History Narrative   Lives in [place] by herself.  She is active around the house w/o symptoms or limitations.   Social Drivers of Corporate investment banker Strain: Low Risk  (12/04/2023)   Overall Financial Resource Strain (CARDIA)    Difficulty of Paying Living Expenses: Not hard at all  Food Insecurity: No Food Insecurity (12/04/2023)   Hunger Vital Sign    Worried About Running Out of Food in the Last Year: Never true    Ran Out of Food in the Last Year: Never true   Transportation Needs: No Transportation Needs (12/04/2023)   PRAPARE - Administrator, Civil Service (Medical): No    Lack of Transportation (Non-Medical): No  Physical Activity: Insufficiently Active (12/04/2023)   Exercise Vital Sign    Days of Exercise per Week: 5 days    Minutes of Exercise per  Session: 10 min  Stress: No Stress Concern Present (12/04/2023)   [employer] of Occupational Health - Occupational Stress Questionnaire    Feeling of Stress: Not at all  Social Connections: Moderately Isolated (12/04/2023)   Social Connection and Isolation Panel    Frequency of Communication with Friends and Family: Twice a week    Frequency of Social Gatherings with Friends and Family: Twice a week    Attends Religious Services: 1 to 4 times per year    Active Member of [employer] or Organizations: No    Attends Banker Meetings: Never    Marital Status: Widowed    Tobacco Counseling Counseling given: No    Clinical Intake:  Pre-visit preparation completed: Yes  Pain : No/denies pain     BMI - recorded: 24.07 Nutritional Status: BMI of 19-24  Normal Nutritional Risks: None Diabetes: Yes CBG done?: No Did pt. bring in CBG monitor from home?: No  Lab Results  Component Value Date   HGBA1C 5.9 08/13/2023   HGBA1C 6.3 02/20/2023   HGBA1C 6.0 08/08/2022     How often do you need to have someone help you when you read instructions, pamphlets, or other written materials from your doctor or pharmacy?: 1 - Never  Interpreter Needed?: No  Information entered by :: Judy [name], CMA   Activities of Daily Living     12/04/2023    9:10 AM  In your present state of health, do you have any difficulty performing the following activities:  Hearing? 0  Vision? 0  Difficulty concentrating or making decisions? 0  Walking or climbing stairs? 0  Dressing or bathing? 0  Doing errands, shopping? 0  Preparing Food and eating ? N  Using the Toilet? N  In  the past six months, have you accidently leaked urine? Y  Comment wears a pad/pantyliner  Do you have problems with loss of bowel control? N  Managing your Medications? N  Managing your Finances? N  Housekeeping or managing your Housekeeping? N    Patient Care Team: Geofm Glade PARAS, MD as PCP - General (Internal Medicine) Delford Maude BROCKS, MD as PCP - Cardiology (Cardiology) Delford Maude BROCKS, MD (Cardiology) Kay Kemps, MD (Orthopedic Surgery) Jarold Mayo, MD (Ophthalmology) Szabat, Toribio BROCKS, [facility] (Inactive) as Pharmacist (Pharmacist)  I have updated your Care Teams any recent Medical Services you may have received from other providers in the past year.     Assessment:   This is a routine wellness examination for Judy [name].  Hearing/Vision screen Hearing Screening - Comments:: Denies hearing difficulties   Vision Screening - Comments:: Sees Dr Jarold   Goals Addressed               This Visit's Progress     Patient Stated (pt-stated)        Patient stated she plans to continue walking and avoid falling       Depression Screen     12/04/2023    9:13 AM 08/22/2023   10:12 AM 02/19/2023    1:13 PM 07/20/2022    8:49 AM 07/19/2021   10:06 AM 05/02/2021    2:46 PM 05/02/2021    2:40 PM  PHQ 2/9 Scores  PHQ - 2 Score 0 0 2 0 0 0 0  PHQ- 9 Score 3  3 0       Fall Risk     12/04/2023    9:11 AM 08/22/2023   10:12 AM 02/19/2023    1:13 PM 07/20/2022  8:49 AM 07/19/2021   10:06 AM  Fall Risk   Falls in the past year? 1 0 0 0 1  Number falls in past yr: 0 0 0 0 1  Comment 1      Injury with Fall? 1 0 0 0 0  Comment hit head but no concerns after examination      Risk for fall due to : History of fall(s);Impaired balance/gait No Fall Risks No Fall Risks No Fall Risks   Follow up Falls evaluation completed;Falls prevention discussed Falls evaluation completed Falls evaluation completed Falls evaluation completed     MEDICARE RISK AT HOME:  Medicare Risk at Home Any  stairs in or around the home?: No If so, are there any without handrails?: No Home free of loose throw rugs in walkways, pet beds, electrical cords, etc?: Yes Adequate lighting in your home to reduce risk of falls?: Yes Life alert?: No Use of a cane, walker or w/c?: Yes (cane/walker) Grab bars in the bathroom?: Yes Shower chair or bench in shower?: Yes Elevated toilet seat or a handicapped toilet?: Yes  TIMED UP AND GO:  Was the test performed?  No  Cognitive Function: 6CIT completed    05/15/2017   11:39 AM  MMSE - Mini Mental State Exam  Orientation to time 5   Orientation to Place 5   Registration 3   Attention/ Calculation 5   Recall 2   Language- name 2 objects 2   Language- repeat 1  Language- follow 3 step command 3   Language- read & follow direction 1   Write a sentence 1   Copy design 1   Total score 29      Data saved with a previous flowsheet row definition        12/04/2023    9:17 AM 02/17/2020   10:15 AM  6CIT Screen  What Year? 0 points 0 points  What month? 0 points 0 points  What time? 0 points 0 points  Count back from 20 0 points 0 points  Months in reverse 0 points 0 points  Repeat phrase 0 points 2 points  Total Score 0 points 2 points    Immunizations Immunization History  Administered Date(s) Administered   Fluad Quad(high Dose 65+) 04/13/2020, 04/05/2021, 04/04/2022   Fluad Trivalent(High Dose 65+) 03/13/2023   Influenza Split 03/22/2012   Influenza Whole 03/21/2011   Influenza, High Dose Seasonal PF 04/06/2015, 02/18/2016, 03/02/2017, 03/08/2018   Influenza,inj,quad, With Preservative 02/26/2019   Influenza-Unspecified 03/12/2013   PFIZER(Purple Top)SARS-COV-2 Vaccination 07/02/2019, 07/23/2019   Pneumococcal Conjugate-13 09/21/2014   Pneumococcal Polysaccharide-23 05/28/2013   Zoster, Live 06/12/2009    Screening Tests Health Maintenance  Topic Date Due   OPHTHALMOLOGY EXAM  Never done   DTaP/Tdap/Td (1 - Tdap) Never done    Zoster Vaccines- Shingrix (1 of 2) 08/03/1982   FOOT EXAM  02/16/2021   COVID-19 Vaccine (3 - 2024-25 season) 02/11/2023   INFLUENZA VACCINE  01/11/2024   HEMOGLOBIN A1C  02/13/2024   Medicare Annual Wellness (AWV)  12/03/2024   Pneumococcal Vaccine: 50+ Years  Completed   DEXA SCAN  Completed   Hepatitis B Vaccines  Aged Out   HPV VACCINES  Aged Out   Meningococcal B Vaccine  Aged Out    Health Maintenance  Health Maintenance Due  Topic Date Due   OPHTHALMOLOGY EXAM  Never done   DTaP/Tdap/Td (1 - Tdap) Never done   Zoster Vaccines- Shingrix (1 of 2) 08/03/1982   FOOT  EXAM  02/16/2021   COVID-19 Vaccine (3 - 2024-25 season) 02/11/2023   Health Maintenance Items Addressed:  Additional Screening:  Vision Screening: Recommended annual ophthalmology exams for early detection of glaucoma and other disorders of the eye. Would you like a referral to an eye doctor? No    Dental Screening: Recommended annual dental exams for proper oral hygiene  Community Resource Referral / Chronic Care Management: CRR required this visit?  No   CCM required this visit?  No   Plan:    I have personally reviewed and noted the following in the patient's chart:   Medical and social history Use of alcohol, tobacco or illicit drugs  Current medications and supplements including opioid prescriptions. Patient is not currently taking opioid prescriptions. Functional ability and status Nutritional status Physical activity Advanced directives List of other physicians Hospitalizations, surgeries, and ER visits in previous 12 months Vitals Screenings to include cognitive, depression, and falls Referrals and appointments  In addition, I have reviewed and discussed with patient certain preventive protocols, quality metrics, and best practice recommendations. A written personalized care plan for preventive services as well as general preventive health recommendations were provided to  patient.   Judy Judy [name], CMA   12/04/2023   After Visit Summary: (In Person-Declined) Patient declined AVS at this time.  Notes: Nothing significant to report at this time.

## 2023-12-11 ENCOUNTER — Ambulatory Visit (INDEPENDENT_AMBULATORY_CARE_PROVIDER_SITE_OTHER)

## 2023-12-11 DIAGNOSIS — Z7901 Long term (current) use of anticoagulants: Secondary | ICD-10-CM | POA: Diagnosis not present

## 2023-12-11 LAB — POCT INR: INR: 2.1 (ref 2.0–3.0)

## 2023-12-11 NOTE — Progress Notes (Signed)
 Continue 2.5 mg daily except take 1.25 mg on Monday, Wednesday and Fridays. Recheck in 4 weeks.

## 2023-12-11 NOTE — Patient Instructions (Addendum)
 Pre visit review using our clinic review tool, if applicable. No additional management support is needed unless otherwise documented below in the visit note.  Continue 2.5 mg daily except take 1.25 mg on Monday, Wednesday and Fridays. Recheck in 4 weeks.

## 2023-12-12 NOTE — Progress Notes (Signed)
 Date:  12/19/2023   ID:  BERNECE GALL, DOB 07-26-1930, MRN [id number]  PCP:  Geofm Glade PARAS, MD  Cardiologist:   Delford Electrophysiologist:  None   Evaluation Performed:  Follow-Up Visit  Chief Complaint:  CAD/AFib  History of Present Illness:    89 y.o. history of CAD. First stent RCA 1996 and PDA/OM in 2006. Distant cerebellar stroke No carotid disease. Chronic afib on coumadin . Activity limited by macular degeneration and peripheral neuropathy. Has seen primary and neurology on B12 ? From diet controlled DM as well On statin for HLD  Statin Changed to crestor  due to interaction of simvastatin  with cardizem     No palpitations, bleeding , chest pain Toprol  dose decreased in July 2022 due to relative bradycardia   She has no cardiac  complaints    Gout of right first MCP May 2022 Rx with Colchicine  Uric acid 7.1 just above normal limit of 7.0   Lost some weight with tonsillitis and COVID latter in February 2023   Upset about primary care lack Of being proactive with Rx of COVID   DOAC;s have been too expensive in past   Carotids 09/22/22 plaque no stenosis   Still living independently Lots of grand kids and great grand kids  Mechanical fall in bathroom 08/29/23 Hit forehead. CT head/neck negative   She is medicare and eliquis  should not be that expensive. Discussed using low dose eliquis  due to age, prior Cr 1.53 and easy bruising    Past Medical History:  Diagnosis Date   Atrial fibrillation (HCC)    a. chronic anticoag, failed prior DCCV;  b. 03/2010 Echo: EF 55-65%, mod MR, mildly to mod dil LA, mod dil RA, mild to mod MR.   CAD (coronary artery disease)    a. BMS to RCA 96';  b. Stent PDA and OM DES 2006   CAROTID ARTERY DISEASE    a. 11/2011 Carotid U/S:  0-39% bilat dzs.   HTN (hypertension)    Hyperlipidemia    LBBB (left bundle branch block)    Macular degeneration, wet (HCC) 10/2012 dx   L>R, follows with retinal specialist->injections monthly.   Obesity     Vertigo    Past Surgical History:  Procedure Laterality Date   CORONARY STENT PLACEMENT  1996, 2006 x2   Drug-eluting stent placement to the PDA, drug-eluting stent  placement to the first obtuse  marginal, StarClose closure of the right common femoral arteriotomy site. Successful drug-eluting stent  placement in  both the posterior descending artery and the obtuse marginal. The lesion was directly stented using a 2.5 x 16 mm Taxus deployed at 14 atmospheres.    EYE SURGERY     Tear duct stent     Current Meds  Medication Sig   acetaminophen (TYLENOL) 500 MG tablet Take 500 mg by mouth every 6 (six) hours as needed for mild pain (pain score 1-3) or moderate pain (pain score 4-6).   aspirin 81 MG tablet Take 81 mg by mouth daily.   azelastine  (ASTELIN ) 0.1 % nasal spray Place 2 sprays into the nose 2 (two) times daily as needed for rhinitis or allergies.   b complex vitamins tablet Take 1 tablet by mouth daily.   diltiazem  (CARDIZEM  CD) 120 MG 24 hr capsule TAKE 1 CAPSULE BY MOUTH EVERY DAY   furosemide  (LASIX ) 20 MG tablet Take 1 tablet (20 mg total) by mouth daily.   isosorbide  mononitrate (IMDUR ) 30 MG 24 hr tablet TAKE 1 TABLET BY  MOUTH EVERYDAY AT BEDTIME   lisinopril  (ZESTRIL ) 10 MG tablet TAKE 1 TABLET BY MOUTH EVERY DAY   loratadine (CLARITIN) 10 MG tablet Take 10 mg by mouth daily.   metoprolol  succinate (TOPROL -XL) 50 MG 24 hr tablet TAKE 1 TABLET BY MOUTH EVERY DAY WITH OR IMMEDIATELY FOLLOWING A MEAL   Multiple Vitamin (MULTIVITAMIN WITH MINERALS) TABS tablet Take 1 tablet by mouth daily.   nitroGLYCERIN  (NITROSTAT ) 0.4 MG SL tablet PLACE 1 TABLET UNDER TONGUE EVERY 5 MINUTES AS NEEDED FOR CHEST PAIN   potassium chloride  (MICRO-K ) 10 MEQ CR capsule TAKE 1 CAPSULE BY MOUTH EVERY DAY   rosuvastatin  (CRESTOR ) 10 MG tablet TAKE 1 TABLET BY MOUTH ONCE DAILY   sodium chloride (OCEAN) 0.65 % SOLN nasal spray Place 1 spray into both nostrils as needed for congestion.   tobramycin  (TOBREX) 0.3 % ophthalmic solution Place 1 drop into the right eye every 4 (four) hours.   warfarin (COUMADIN ) 1 MG tablet TAKE 1 TABLET BY MOUTH MONDAYS, WEDNESDAYS, FRIDAYS AND SATURDAYS OR AS DIRECTED BY ANTICOAGULATION CLINIC.   warfarin (COUMADIN ) 2.5 MG tablet TAKE 1 TABLET BY MOUTH DAILY EXCEPT TAKE 1/2 TABLET ON FRIDAYS OR AS DIRECTED BY ANTICOAGULATION CLINIC     Allergies:   Betadine [povidone iodine] and Methocarbamol    Social History   Tobacco Use   Smoking status: Never   Smokeless tobacco: Never  Vaping Use   Vaping status: Never Used  Substance Use Topics   Alcohol use: No   Drug use: No     Family Hx: The patient's family history includes Arthritis in her father and mother; CAD in her brother, brother, and sister.  ROS:   Please see the history of present illness.     All other systems reviewed and are negative.   Prior CV studies:   The following studies were reviewed today:  Carotid 12/18/18 plaque no stenosis   Labs/Other Tests and Data Reviewed:    EKG:  12/19/2023 0  afib old IMI IVCD nonspecific ST changes   Recent Labs: 02/20/2023: TSH 1.62 08/13/2023: ALT 14; BUN 16; Creatinine, Ser 1.13; Hemoglobin 13.1; Platelets 231.0; Potassium 4.0; Sodium 145   Recent Lipid Panel Lab Results  Component Value Date/Time   CHOL 176 08/13/2023 09:59 AM   CHOL 178 05/12/2019 09:25 AM   TRIG 241.0 (H) 08/13/2023 09:59 AM   HDL 63.70 08/13/2023 09:59 AM   HDL 47 05/12/2019 09:25 AM   CHOLHDL 3 08/13/2023 09:59 AM   LDLCALC 64 08/13/2023 09:59 AM   LDLCALC 98 02/17/2020 10:16 AM   LDLDIRECT 89.0 01/31/2022 04:29 PM    Wt Readings from Last 3 Encounters:  12/19/23 146 lb 6.4 oz (66.4 kg)  12/04/23 140 lb 3.2 oz (63.6 kg)  08/29/23 135 lb (61.2 kg)     Objective:    Vital Signs:  BP (!) 120/52   Pulse 60   Ht 5' 2 (1.575 m)   Wt 146 lb 6.4 oz (66.4 kg)   SpO2 99%   BMI 26.78 kg/m    Affect appropriate Healthy:  appears stated age HEENT:  normal Neck supple with no adenopathy JVP normal no bruits no thyromegaly Lungs clear with no wheezing and good diaphragmatic motion Heart:  S1/S2 no murmur, no rub, gallop or click PMI normal Abdomen: benighn, BS positve, no tenderness, no AAA no bruit.  No HSM or HJR Distal pulses intact with no bruits No edema Neuro non-focal Skin warm and dry No muscular weakness   ASSESSMENT &  PLAN:    CAD:  Distant intervention to RCA and OM last in 2006 no angina continue medical RX given age  HLD:  Continue statin LDL 73   DM:  Diet controlled A1c 6.6 01/31/22   Afib:  Toporl decreased to 50 mg 12/28/20 Change to eliquis  low dose if not cost prohibitive Check H/H BMET today   Neuropathy:  On B12 ? Related to DM f/u neurology   Macular Degeneration: f/u Dr Zackary has had injections and improved  Tests Ordered:  Hct, BMET  Medication Changes:  Eliquis  2.5 mg bid  Disposition:  Follow up in a year   Signed, Maude Emmer, MD  12/19/2023 9:48 AM    [facility]

## 2023-12-17 ENCOUNTER — Other Ambulatory Visit: Payer: Self-pay | Admitting: Internal Medicine

## 2023-12-17 DIAGNOSIS — Z7901 Long term (current) use of anticoagulants: Secondary | ICD-10-CM

## 2023-12-19 ENCOUNTER — Other Ambulatory Visit (HOSPITAL_COMMUNITY): Payer: Self-pay

## 2023-12-19 ENCOUNTER — Telehealth: Payer: Self-pay | Admitting: *Deleted

## 2023-12-19 ENCOUNTER — Ambulatory Visit: Attending: Cardiovascular Disease | Admitting: Cardiovascular Disease

## 2023-12-19 ENCOUNTER — Other Ambulatory Visit: Payer: Self-pay

## 2023-12-19 VITALS — BP 120/52 | HR 60 | Ht 62.0 in | Wt 146.4 lb

## 2023-12-19 DIAGNOSIS — E782 Mixed hyperlipidemia: Secondary | ICD-10-CM

## 2023-12-19 DIAGNOSIS — Z5181 Encounter for therapeutic drug level monitoring: Secondary | ICD-10-CM | POA: Insufficient documentation

## 2023-12-19 DIAGNOSIS — I482 Chronic atrial fibrillation, unspecified: Secondary | ICD-10-CM

## 2023-12-19 DIAGNOSIS — Z7901 Long term (current) use of anticoagulants: Secondary | ICD-10-CM | POA: Insufficient documentation

## 2023-12-19 DIAGNOSIS — I2510 Atherosclerotic heart disease of native coronary artery without angina pectoris: Secondary | ICD-10-CM

## 2023-12-19 MED ORDER — APIXABAN 2.5 MG PO TABS: 2.5000 mg | ORAL_TABLET | Freq: Two times a day (BID) | ORAL | 0 refills | Status: DC

## 2023-12-19 MED ORDER — APIXABAN 2.5 MG PO TABS: 2.5000 mg | ORAL_TABLET | Freq: Two times a day (BID) | ORAL | 11 refills | Status: DC

## 2023-12-19 NOTE — Addendum Note (Signed)
 Addended by: TAFFY LOVEY KIDD on: 12/19/2023 10:18 AM   Modules accepted: Orders

## 2023-12-19 NOTE — Telephone Encounter (Signed)
 Medication name/dosage: Samples List: Eliquis  2.5 mg  Administration instructions:   Reason for samples: Reason for samples: new start  Ordering provider: Dr. Delford  *Once above information entered, route the phone encounter to CV DIV MAG ST SAMPLES and send Teams message to team member assigned to Samples for the day.

## 2023-12-19 NOTE — Patient Instructions (Signed)
 Medication Instructions:  Your physician has recommended you make the following change in your medication:   1-START Eliquis  2.5 mg by mouth twice daily.  *If you need a refill on your cardiac medications before your next appointment, please call your pharmacy*  Lab Work: Your physician recommends that you have lab work today- BMET and Hemoglobin and Hematocrit  If you have labs (blood work) drawn today and your tests are completely normal, you will receive your results only by: MyChart Message (if you have MyChart) OR A paper copy in the mail If you have any lab test that is abnormal or we need to change your treatment, we will call you to review the results.  Testing/Procedures: None ordered today.  Follow-Up: At [facility], you and your health needs are our priority.  As part of our continuing mission to provide you with exceptional heart care, our providers are all part of one team.  This team includes your primary Cardiologist (physician) and Advanced Practice Providers or APPs (Physician Assistants and Nurse Practitioners) who all work together to provide you with the care you need, when you need it.  Your next appointment:   6 month(s)  Provider:   Maude Emmer, MD    We recommend signing up for the patient portal called MyChart.  Sign up information is provided on this After Visit Summary.  MyChart is used to connect with patients for Virtual Visits (Telemedicine).  Patients are able to view lab/test results, encounter notes, upcoming appointments, etc.  Non-urgent messages can be sent to your provider as well.   To learn more about what you can do with MyChart, go to [email or web address].

## 2023-12-20 LAB — BASIC METABOLIC PANEL WITH GFR
BUN/Creatinine Ratio: 17 (ref 12–28)
BUN: 16 mg/dL (ref 10–36)
CO2: 20 mmol/L (ref 20–29)
Calcium: 9.5 mg/dL (ref 8.7–10.3)
Chloride: 104 mmol/L (ref 96–106)
Creatinine, Ser: 0.92 mg/dL (ref 0.57–1.00)
Glucose: 81 mg/dL (ref 70–99)
Potassium: 4.2 mmol/L (ref 3.5–5.2)
Sodium: 143 mmol/L (ref 134–144)
eGFR: 59 mL/min/1.73 — AB (ref >59)

## 2023-12-20 LAB — HEMOGLOBIN: Hemoglobin: 12 g/dL (ref 11.1–15.9)

## 2023-12-20 LAB — HEMATOCRIT: Hematocrit: 37.8 % (ref 34.0–46.6)

## 2024-01-01 DIAGNOSIS — M25562 Pain in left knee: Secondary | ICD-10-CM | POA: Diagnosis not present

## 2024-01-01 DIAGNOSIS — M25561 Pain in right knee: Secondary | ICD-10-CM | POA: Diagnosis not present

## 2024-01-07 ENCOUNTER — Ambulatory Visit (INDEPENDENT_AMBULATORY_CARE_PROVIDER_SITE_OTHER): Admitting: Family Medicine

## 2024-01-07 ENCOUNTER — Encounter: Payer: Self-pay | Admitting: Family Medicine

## 2024-01-07 ENCOUNTER — Ambulatory Visit: Admitting: Family Medicine

## 2024-01-07 VITALS — BP 132/74 | HR 66 | Temp 98.7°F | Resp 18 | Ht 62.0 in | Wt 143.0 lb

## 2024-01-07 DIAGNOSIS — I10 Essential (primary) hypertension: Secondary | ICD-10-CM | POA: Diagnosis not present

## 2024-01-07 DIAGNOSIS — R410 Disorientation, unspecified: Secondary | ICD-10-CM

## 2024-01-07 DIAGNOSIS — R32 Unspecified urinary incontinence: Secondary | ICD-10-CM

## 2024-01-07 DIAGNOSIS — I4891 Unspecified atrial fibrillation: Secondary | ICD-10-CM

## 2024-01-07 LAB — BASIC METABOLIC PANEL WITH GFR
BUN: 17 mg/dL (ref 6–23)
CO2: 29 meq/L (ref 19–32)
Calcium: 9 mg/dL (ref 8.4–10.5)
Chloride: 104 meq/L (ref 96–112)
Creatinine, Ser: 0.98 mg/dL (ref 0.40–1.20)
GFR: 50.49 mL/min — ABNORMAL LOW (ref >60.00)
Glucose, Bld: 94 mg/dL (ref 70–99)
Potassium: 4.5 meq/L (ref 3.5–5.1)
Sodium: 139 meq/L (ref 135–145)

## 2024-01-07 NOTE — Progress Notes (Signed)
 Assessment & Plan:  1-2. Urinary incontinence, unspecified type (Primary)/Confusion Discussed symptoms could be due to a urinary tract infection.  Patient was unable to leave a urine sample, therefore collection supplies were sent home with her. - Urine Culture; Future - Urinalysis, Routine w reflex microscopic; Future - TSH; Future  3. Essential hypertension, benign Blood pressure is well-controlled without the furosemide .  Discussed with the patient that the reason she is having an increase in her lower extremity edema is because she stopped taking the furosemide .  Also discussed the concern that she has continued to take potassium without taking the furosemide  and that this could alter her potassium levels. - Basic metabolic panel with GFR; Future  4. Atrial fibrillation, unspecified type [facility]) Referring for prescription assistance with Eliquis . - AMB Referral [facility] Care Management   Follow up plan: Return if symptoms worsen or fail to improve.  Judy Rung, MSN, APRN, FNP-C  Subjective:  HPI: Judy [name] is a 89 y.o. female presenting on 01/07/2024 for Urinary Tract Infection (Urine incontinence,  feels dehydrated, some hallucinations last 2 weeks, some meds have been messed up (confusion)/ )  Patient is accompanied by her daughter, who she is okay with being present.  Patient reports urinary incontinence that started last week.  She reports she feels dehydrated as evidenced by a dry mouth.  Her daughter reports Judy [name] has been experiencing increased confusion and hallucinations over the past 2 weeks.  It is unclear what medications the patient has or has not been taking as she started adjusting her own medications since this has been going on.  Patient does report that she stopped taking her furosemide  due to the urinary incontinence; she has continued taking her potassium supplement.  She was taking her medications as prescribed when she saw her cardiologist and had lab  work earlier this month.  Patient's daughter did also mention the high cost of Eliquis  and her concern about future prescriptions.   ROS: Negative unless specifically indicated above in HPI.   Relevant past medical history reviewed and updated as indicated.   Allergies and medications reviewed and updated.   Current Outpatient Medications:    acetaminophen (TYLENOL) 500 MG tablet, Take 500 mg by mouth every 6 (six) hours as needed for mild pain (pain score 1-3) or moderate pain (pain score 4-6)., Disp: , Rfl:    apixaban  (ELIQUIS ) 2.5 MG TABS tablet, Take 1 tablet (2.5 mg total) by mouth 2 (two) times daily., Disp: 60 tablet, Rfl: 11   aspirin 81 MG tablet, Take 81 mg by mouth daily., Disp: , Rfl:    azelastine  (ASTELIN ) 0.1 % nasal spray, Place 2 sprays into the nose 2 (two) times daily as needed for rhinitis or allergies., Disp: , Rfl:    b complex vitamins tablet, Take 1 tablet by mouth daily., Disp: , Rfl:    diltiazem  (CARDIZEM  CD) 120 MG 24 hr capsule, TAKE 1 CAPSULE BY MOUTH EVERY DAY, Disp: 90 capsule, Rfl: 2   furosemide  (LASIX ) 20 MG tablet, Take 1 tablet (20 mg total) by mouth daily., Disp: 90 tablet, Rfl: 2   isosorbide  mononitrate (IMDUR ) 30 MG 24 hr tablet, TAKE 1 TABLET BY MOUTH EVERYDAY AT BEDTIME, Disp: 90 tablet, Rfl: 2   lisinopril  (ZESTRIL ) 10 MG tablet, TAKE 1 TABLET BY MOUTH EVERY DAY, Disp: 90 tablet, Rfl: 3   loratadine (CLARITIN) 10 MG tablet, Take 10 mg by mouth daily., Disp: , Rfl:    metoprolol  succinate (TOPROL -XL) 50 MG 24 hr  tablet, TAKE 1 TABLET BY MOUTH EVERY DAY WITH OR IMMEDIATELY FOLLOWING A MEAL, Disp: 90 tablet, Rfl: 2   Multiple Vitamin (MULTIVITAMIN WITH MINERALS) TABS tablet, Take 1 tablet by mouth daily., Disp: , Rfl:    potassium chloride  (MICRO-K ) 10 MEQ CR capsule, TAKE 1 CAPSULE BY MOUTH EVERY DAY, Disp: 90 capsule, Rfl: 2   Probiotic Product (PROBIOTIC ADVANCED PO), Take 1 tablet by mouth., Disp: , Rfl:    rosuvastatin  (CRESTOR ) 10 MG tablet,  TAKE 1 TABLET BY MOUTH ONCE DAILY, Disp: 90 tablet, Rfl: 3   sodium chloride (OCEAN) 0.65 % SOLN nasal spray, Place 1 spray into both nostrils as needed for congestion., Disp: , Rfl:    tobramycin (TOBREX) 0.3 % ophthalmic solution, Place 1 drop into the right eye every 4 (four) hours., Disp: , Rfl:    nitroGLYCERIN  (NITROSTAT ) 0.4 MG SL tablet, PLACE 1 TABLET UNDER TONGUE EVERY 5 MINUTES AS NEEDED FOR CHEST PAIN (Patient not taking: Reported on 01/07/2024), Disp: 25 tablet, Rfl: 8  Allergies  Allergen Reactions   Betadine [Povidone Iodine] Other (See Comments)    burns eyes   Methocarbamol  Other (See Comments)    dizziness    Objective:   BP 132/74   Pulse 66   Temp 98.7 F (37.1 C)   Resp 18   Ht 5' 2 (1.575 m)   Wt 143 lb (64.9 kg)   SpO2 99%   BMI 26.16 kg/m    Physical Exam Vitals reviewed.  Constitutional:      General: She is not in acute distress.    Appearance: Normal appearance. She is not ill-appearing, toxic-appearing or diaphoretic.  HENT:     Head: Normocephalic and atraumatic.  Eyes:     General: No scleral icterus.       Right eye: No discharge.        Left eye: No discharge.     Conjunctiva/sclera: Conjunctivae normal.  Cardiovascular:     Rate and Rhythm: Normal rate.  Pulmonary:     Effort: Pulmonary effort is normal. No respiratory distress.  Abdominal:     Tenderness: There is no right CVA tenderness or left CVA tenderness.  Musculoskeletal:        General: Normal range of motion.     Cervical back: Normal range of motion.     Right lower leg: Edema present.     Left lower leg: Edema present.  Skin:    General: Skin is warm and dry.     Capillary Refill: Capillary refill takes less than 2 seconds.  Neurological:     General: No focal deficit present.     Mental Status: She is alert and oriented to person, place, and time. Mental status is at baseline.  Psychiatric:        Mood and Affect: Mood normal.        Behavior: Behavior normal.         Thought Content: Thought content normal.        Judgment: Judgment normal.

## 2024-01-08 ENCOUNTER — Telehealth: Payer: Self-pay

## 2024-01-08 ENCOUNTER — Ambulatory Visit: Payer: Self-pay | Admitting: Family Medicine

## 2024-01-08 ENCOUNTER — Ambulatory Visit (INDEPENDENT_AMBULATORY_CARE_PROVIDER_SITE_OTHER)

## 2024-01-08 DIAGNOSIS — N3001 Acute cystitis with hematuria: Secondary | ICD-10-CM

## 2024-01-08 DIAGNOSIS — Z7901 Long term (current) use of anticoagulants: Secondary | ICD-10-CM | POA: Diagnosis not present

## 2024-01-08 LAB — URINALYSIS, ROUTINE W REFLEX MICROSCOPIC
Bilirubin Urine: NEGATIVE
Ketones, ur: NEGATIVE
Nitrite: POSITIVE — AB
Specific Gravity, Urine: 1.015 (ref 1.000–1.030)
Urine Glucose: NEGATIVE
Urobilinogen, UA: 0.2 (ref 0.0–1.0)
pH: 7 (ref 5.0–8.0)

## 2024-01-08 LAB — TSH: TSH: 1.3 u[IU]/mL (ref 0.35–5.50)

## 2024-01-08 LAB — POCT INR: INR: 2 (ref 2.0–3.0)

## 2024-01-08 MED ORDER — CEFDINIR 300 MG PO CAPS: 300.0000 mg | ORAL_CAPSULE | Freq: Two times a day (BID) | ORAL | 0 refills | Status: DC

## 2024-01-08 NOTE — Telephone Encounter (Signed)
 Copied from CRM [id number]. Topic: Clinical - Medical Advice >> Jan 08, 2024  7:42 AM Precious C wrote: Reason for CRM: Daughter called on behalf of her mother, who was seen yesterday for UTI symptoms. At the appointment, her mother was unable to provide a urine sample, so she was sent home with collection supplies. However, since her mother had bloodwork scheduled the same day, they went to the lab afterward and were able to submit the urine sample there. The urinalysis was collected at the lab that same day. She is concerned that her mother's ongoing symptoms may be due to the UTI and is upset about the delay in receiving results and medication, she's frustrated and seeking clarification on when the results will be available and when her mother will receive treatment. She also questioned why a rapid test was not done at the appointment. Luke prefers that results not be posted to MyChart and instead communicated via phone call. Her callback number is [phone]).

## 2024-01-08 NOTE — Patient Instructions (Signed)
Pre visit review using our clinic review tool, if applicable. No additional management support is needed unless otherwise documented below in the visit note. 

## 2024-01-08 NOTE — Progress Notes (Signed)
 Pt and her daughter, Judy [name], report pt has been transitioned to Eliquis  on 7/9 by cardiology. Judy [name] reports she is concerned that pt has a UTI because she has been having confusion and urinary frequency for 2-3 weeks. Pt had an OV with a provider yesterday for UTI symptoms. Urine culture is pending. No hematuria in urinalysis. Judy [name] reports during this confusion pt had taken some medication out of her pill box and added others, but she is unsure if pt has taken any warfarin. Pt reported she did take warfarin 2 days ago but is unsure if she took anymore. Pt's other daughter fills pts pill box weekly and has not placed any warfarin in the box since switch to Eliquis . Pt does admit she has been having some confusion. Pt also stopped taking furosemide  because she was tired of excessive urination.  Advised to stop taking warfarin and continue Eliquis . Advised a msg will be sent to the provider the pt saw yesterday and also PCP concerning test results. Judy [name] reported she has sent two msgs to the provider's office but has not heard back concerning the results. Judy [name] if she did not hear back from the office by late this afternoon to contact that provider's office. Advised if confusion worsens or she develops any new symptoms to contact clinic or take pt to ER.  Advised if any other concerns feel free to contact coumadin  clinic. Pt and Judy [name] verbalized understanding and were appreciative.  Checked INR today due to confusion on whether pt has been taking warfarin with Elilquis. Pt also had two large bruises on her hands.   Review of chart revealed an abx was sent by provider for UTI. Pt and her daughter are aware.

## 2024-01-08 NOTE — Telephone Encounter (Signed)
 Pt came to coumadin  clinic apt today even though she was transitioned to Eliquis  on 7/9 by cardiology.   Kim reports at this apt that she is concerned that pt has a UTI because she has been having confusion and urinary frequency for 2-3 weeks. Pt had an OV with a provider yesterday for UTI symptoms. Urine culture is pending. No hematuria in urinalysis. Kim reports during this confusion pt had taken some medication out of her pill box and added others, but she is unsure if pt has taken any warfarin. Pt reported she did take warfarin 2 days ago but is unsure if she took anymore. Pt's other daughter fills pts pill box weekly and has not placed any warfarin in the box since switch to Eliquis . Pt does admit she has been having some confusion. Pt also stopped taking furosemide  because she was tired of excessive urination. All medications have been removed from the house except for the pill box due to confusion.   Advised to stop taking warfarin and continue Eliquis . Advised a msg will be sent to the provider the pt saw yesterday and also PCP concerning test results. Kim reported she has sent two msgs to the provider's office but has not heard back concerning the results. Myles Cramp if she did not hear back from the office by late this afternoon to contact that provider's office. Advised if confusion worsens or she develops any new symptoms to contact clinic or take pt to ER. Pt denies any other symptoms today.  INR was checked today due to confusion on whether pt has been taking warfarin with Elilquis. Pt also had two large bruises on her hands.   Review of chart result revealed provider pt had OV with yesterday reported pt does have UTI and she has sent in cefdinir .   Contacted pt and her daughter. Cramp reports they have received a msg that abx was sent in and they have picked it up. Advised if anything is needed to contact the office. Pt and Kim verbalized understanding.

## 2024-01-08 NOTE — Telephone Encounter (Unsigned)
 Copied from CRM [id number]. Topic: Appointments - Scheduling Inquiry for Clinic >> Jan 08, 2024  9:06 AM Frederich PARAS wrote: Reason for CRM: PT Calling she wants to know if a analysis can be put in before the 10am apt to drop urin , pt may have a uti. Pt also wants to know I a rush can be done for results to get started on the medicine if pt is positive.

## 2024-01-10 LAB — URINE CULTURE

## 2024-01-10 NOTE — Telephone Encounter (Signed)
 Daughter aware already on the meds

## 2024-01-11 ENCOUNTER — Ambulatory Visit: Payer: Self-pay

## 2024-01-11 ENCOUNTER — Other Ambulatory Visit: Payer: Self-pay | Admitting: Internal Medicine

## 2024-01-11 MED ORDER — CIPROFLOXACIN HCL 250 MG PO TABS: 250.0000 mg | ORAL_TABLET | Freq: Two times a day (BID) | ORAL | 0 refills | Status: AC

## 2024-01-11 NOTE — Telephone Encounter (Signed)
 Kim, pt's daughter, LVM reporting pt has been on the abx, cefdinir , x 3 days and has 4 more days. Pt is still having frequency, urgency, dysuria and some confusion/agitation.   She is wondering if the pt should be feeling better by now since she started the abx 3 days ago.  Contacted Kim and advised it could take the full course of abx before improvement is seen. She report a pharmacist told her that the abx prescribed is not strong enough and she may need a different abx. Advised there has been a msg by triage nurse to PCP. Advised this nurse will f/u with PCP if she does not see the msg and this nurse or PCP's CMA will return call. Kim verbalized understanding and was appreciative of the help.

## 2024-01-11 NOTE — Telephone Encounter (Signed)
 The current antibiotic should work but since there is no improvement will change but if no improvement she should be evaluated.    Stop omnicef .    Start cipro x 3 days - sent to pharm

## 2024-01-11 NOTE — Telephone Encounter (Signed)
 Kim, pt's daughter, LVM reporting pt has been on the abx, cefdinir , x 3 days and has 4 more days. Pt is still having frequency, urgency, dysuria and some confusion/agitation.   She is wondering if the pt should be feeling better by now since she started the abx 3 days ago.  Advised it could take the full course of abx before improvement is seen. She report a pharmacist told her that the abx prescribed is not strong enough and she may need a different abx. Advised there has been a msg by triage nurse to PCP. Advised this nurse will f/u with PCP if she does not see the msg and this nurse or PCP's CMA will return call. Kim verbalized understanding and was appreciative of the help.    Attached this msg to the triage note in pt's chart and resent to PCP.

## 2024-01-11 NOTE — Telephone Encounter (Signed)
 Made PCP's CMA today aware of the high priority msg. Advised to make sure PCP is aware as soon as possible. Ronnald, CMA, verbalized understanding.

## 2024-01-11 NOTE — Telephone Encounter (Signed)
 FYI Only or Action Required?: Action required by provider: clinical question for provider and update on patient condition.  Patient was last seen in primary care on 01/07/2024 by Merlynn Niki FALCON, FNP.  Called Nurse Triage reporting No chief complaint on file..  Symptoms began several days ago.  Interventions attempted: Prescription medications: cefdinir .  Symptoms are: gradually worsening.  Triage Disposition: No disposition on file.  Patient/caregiver understands and will follow disposition?:  Reason for Disposition  [1] Taking antibiotic > 72 hours (3 days) for UTI AND [2] painful urination or frequency is SAME (unchanged, not better)  Answer Assessment - Initial Assessment Questions Please review culture and advise. Patient's daughter reports no improvement with Cefdinir  and now pt is complaining of burning with urination. Please call daughter:  Ezzard Cramp (Daughter) [phone] [facility])  Confirmed preferred pharmacy is correct.  Answer Assessment - Initial Assessment Questions Please review culture and advise. Patient's daughter reports no improvement with Cefdinir  and now pt is complaining of burning with urination. Please call daughter:  Ezzard Cramp (Daughter) [phone] [facility])  Confirmed preferred pharmacy is correct.  Protocols used: Urinalysis Results Follow-up Call-A-AH, Urinary Tract Infection on Antibiotic Follow-up Call - [facility]   Copied from CRM [id number]. Topic: Clinical - Red Word Triage >> Jan 11, 2024 11:48 AM [place] J wrote: [facility] that prompted transfer to Nurse Triage: Patient has a UTI and she is not seeing any progress with the antibiotic. She is now experiencing burning during urination and is wondering if there's anything else she could take for relief.   *Daughter is also stating that her UTI could have been going on for 2 weeks now.*  Medication: cefdinir  (OMNICEF ) 300 MG  capsule. ----------------------------------------------------------------------- From previous Reason for Contact - Medication Question: Reason for CRM:

## 2024-01-11 NOTE — Telephone Encounter (Signed)
 Called pt daughter and made her aware of Dr. Geofm not, for pt to stop current antibiotics and start the new one that was send in, for 2 times daily for three. If symptoms dose not improve to call our office and schedule an appointment.

## 2024-01-16 ENCOUNTER — Ambulatory Visit: Payer: Self-pay

## 2024-01-16 NOTE — Telephone Encounter (Signed)
 FYI Only or Action Required?: FYI only for provider.  Patient was last seen in primary care on 01/07/2024 by Merlynn Niki FALCON, FNP.  Called Nurse Triage reporting Urinary Frequency.  Symptoms began yesterday.  Interventions attempted: Rest, hydration, or home remedies.  Symptoms are: unchanged.  Triage Disposition: See Physician Within 24 Hours  Patient/caregiver understands and will follow disposition?: Yes  Copied from CRM #[id number]. Topic: Clinical - Red Word Triage >> Jan 16, 2024  4:33 PM Chiquita [name] wrote: Red Word that prompted transfer to Nurse Triage: Judy the patients daughter is calling in stating that she finished the antibiotic on Monday, yesterday was better, and today all the symptoms of the UTI are back, frequently going to the bathroom and pain. Reason for Disposition  Urinating more frequently than usual (i.e., frequency) OR new-onset of the feeling of an urgent need to urinate (i.e., urgency)  Answer Assessment - Initial Assessment Questions 1. SYMPTOM: What's the main symptom you're concerned about? (e.g., frequency, incontinence)     Urinary frequency 2. ONSET: When did the  urinary frequency  start?     Started last night 3. PAIN: Is there any pain? If Yes, ask: How bad is it? (Scale: 1-10; mild, moderate, severe)     Moderate pain 4. CAUSE: What do you think is causing the symptoms?     Possible uti 5. OTHER SYMPTOMS: Do you have any other symptoms? (e.g., blood in urine, fever, flank pain, pain with urination)     Pain with urination  Patient was treated for a UTI-finished antibiotic on Monday. Symptoms came back last night.  Protocols used: Urinary Symptoms-A-AH

## 2024-01-17 ENCOUNTER — Encounter: Payer: Self-pay | Admitting: Internal Medicine

## 2024-01-17 ENCOUNTER — Ambulatory Visit: Payer: Self-pay | Admitting: Internal Medicine

## 2024-01-17 ENCOUNTER — Ambulatory Visit (INDEPENDENT_AMBULATORY_CARE_PROVIDER_SITE_OTHER): Admitting: Internal Medicine

## 2024-01-17 VITALS — BP 138/82 | HR 68 | Temp 97.7°F | Ht 62.0 in | Wt 143.0 lb

## 2024-01-17 DIAGNOSIS — R300 Dysuria: Secondary | ICD-10-CM | POA: Insufficient documentation

## 2024-01-17 DIAGNOSIS — I10 Essential (primary) hypertension: Secondary | ICD-10-CM

## 2024-01-17 DIAGNOSIS — R32 Unspecified urinary incontinence: Secondary | ICD-10-CM | POA: Diagnosis not present

## 2024-01-17 LAB — URINALYSIS, ROUTINE W REFLEX MICROSCOPIC
Bilirubin Urine: NEGATIVE
Hgb urine dipstick: NEGATIVE
Ketones, ur: NEGATIVE
Nitrite: NEGATIVE
RBC / HPF: NONE SEEN (ref 0–?)
Specific Gravity, Urine: 1.015 (ref 1.000–1.030)
Total Protein, Urine: NEGATIVE
Urine Glucose: NEGATIVE
Urobilinogen, UA: 0.2 (ref 0.0–1.0)
pH: 5.5 (ref 5.0–8.0)

## 2024-01-17 MED ORDER — CIPROFLOXACIN HCL 500 MG PO TABS
500.0000 mg | ORAL_TABLET | Freq: Two times a day (BID) | ORAL | 0 refills | Status: DC
Start: 2024-01-17 — End: 2024-01-27

## 2024-01-17 NOTE — Progress Notes (Signed)
 Patient ID: Judy [name], female   DOB: 12-29-1930, 89 y.o.   MRN: [id number]        Chief Complaint: follow up dysuria, urinary incontinence, htn       HPI:  Judy [name] is a 89 y.o. female here with daughter, with c/o recurrent dysuria almost immediately after last 3 day antibiotic course; did have urine culture 7/28 with E coli panbsensitive.  Denies urinary symptoms such as requency, urgency, flank pain, hematuria or n/v, fever, chills, but has had mild urinary incontinence. Pt denies chest pain, increased sob or doe, wheezing, orthopnea, PND, increased LE swelling, palpitations, dizziness or syncope.   Pt denies polydipsia, polyuria, or new focal neuro s/s.        Wt Readings from Last 3 Encounters:  01/17/24 143 lb (64.9 kg)  01/07/24 143 lb (64.9 kg)  12/19/23 146 lb 6.4 oz (66.4 kg)   BP Readings from Last 3 Encounters:  01/17/24 138/82  01/07/24 132/74  12/19/23 (!) 120/52         Past Medical History:  Diagnosis Date   Atrial fibrillation (HCC)    a. chronic anticoag, failed prior DCCV;  b. 03/2010 Echo: EF 55-65%, mod MR, mildly to mod dil LA, mod dil RA, mild to mod MR.   CAD (coronary artery disease)    a. BMS to RCA 96';  b. Stent PDA and OM DES 2006   CAROTID ARTERY DISEASE    a. 11/2011 Carotid U/S:  0-39% bilat dzs.   HTN (hypertension)    Hyperlipidemia    LBBB (left bundle branch block)    Macular degeneration, wet (HCC) 10/2012 dx   L>R, follows with retinal specialist->injections monthly.   Obesity    Vertigo    Past Surgical History:  Procedure Laterality Date   CORONARY STENT PLACEMENT  1996, 2006 x2   Drug-eluting stent placement to the PDA, drug-eluting stent  placement to the first obtuse  marginal, StarClose closure of the right common femoral arteriotomy site. Successful drug-eluting stent  placement in  both the posterior descending artery and the obtuse marginal. The lesion was directly stented using a 2.5 x 16 mm Taxus deployed at 14  atmospheres.    EYE SURGERY     Tear duct stent    reports that she has never smoked. She has never used smokeless tobacco. She reports that she does not drink alcohol and does not use drugs. family history includes Arthritis in her father and mother; CAD in her brother, brother, and sister. Allergies  Allergen Reactions   Betadine [Povidone Iodine] Other (See Comments)    burns eyes   Methocarbamol  Other (See Comments)    dizziness   Current Outpatient Medications on File Prior to Visit  Medication Sig Dispense Refill   acetaminophen (TYLENOL) 500 MG tablet Take 500 mg by mouth every 6 (six) hours as needed for mild pain (pain score 1-3) or moderate pain (pain score 4-6).     apixaban  (ELIQUIS ) 2.5 MG TABS tablet Take 1 tablet (2.5 mg total) by mouth 2 (two) times daily. 60 tablet 11   aspirin 81 MG tablet Take 81 mg by mouth daily.     azelastine  (ASTELIN ) 0.1 % nasal spray Place 2 sprays into the nose 2 (two) times daily as needed for rhinitis or allergies.     b complex vitamins tablet Take 1 tablet by mouth daily.     diltiazem  (CARDIZEM  CD) 120 MG 24 hr capsule TAKE 1 CAPSULE BY MOUTH EVERY  DAY 90 capsule 2   furosemide  (LASIX ) 20 MG tablet Take 1 tablet (20 mg total) by mouth daily. 90 tablet 2   isosorbide  mononitrate (IMDUR ) 30 MG 24 hr tablet TAKE 1 TABLET BY MOUTH EVERYDAY AT BEDTIME 90 tablet 2   lisinopril  (ZESTRIL ) 10 MG tablet TAKE 1 TABLET BY MOUTH EVERY DAY 90 tablet 3   loratadine (CLARITIN) 10 MG tablet Take 10 mg by mouth daily.     metoprolol  succinate (TOPROL -XL) 50 MG 24 hr tablet TAKE 1 TABLET BY MOUTH EVERY DAY WITH OR IMMEDIATELY FOLLOWING A MEAL 90 tablet 2   Multiple Vitamin (MULTIVITAMIN WITH MINERALS) TABS tablet Take 1 tablet by mouth daily.     nitroGLYCERIN  (NITROSTAT ) 0.4 MG SL tablet PLACE 1 TABLET UNDER TONGUE EVERY 5 MINUTES AS NEEDED FOR CHEST PAIN 25 tablet 8   potassium chloride  (MICRO-K ) 10 MEQ CR capsule TAKE 1 CAPSULE BY MOUTH EVERY DAY 90  capsule 2   Probiotic Product (PROBIOTIC ADVANCED PO) Take 1 tablet by mouth.     rosuvastatin  (CRESTOR ) 10 MG tablet TAKE 1 TABLET BY MOUTH ONCE DAILY 90 tablet 3   sodium chloride (OCEAN) 0.65 % SOLN nasal spray Place 1 spray into both nostrils as needed for congestion.     tobramycin (TOBREX) 0.3 % ophthalmic solution Place 1 drop into the right eye every 4 (four) hours.     No current facility-administered medications on file prior to visit.        ROS:  All others reviewed and negative.  Objective        PE:  BP 138/82   Pulse 68   Temp 97.7 F (36.5 C)   Ht 5' 2 (1.575 m)   Wt 143 lb (64.9 kg)   SpO2 98%   BMI 26.16 kg/m                 Constitutional: Pt appears in NAD               HENT: Head: NCAT.                Right Ear: External ear normal.                 Left Ear: External ear normal.                Eyes: . Pupils are equal, round, and reactive to light. Conjunctivae and EOM are normal               Nose: without d/c or deformity               Neck: Neck supple. Gross normal ROM               Cardiovascular: Normal rate and regular rhythm.                 Pulmonary/Chest: Effort normal and breath sounds without rales or wheezing.                Abd:  Soft, NT, ND, + BS, no organomegaly               Neurological: Pt is alert. At baseline orientation, motor grossly intact               Skin: Skin is warm. No rashes, no other new lesions, LE edema - none               Psychiatric: Pt behavior is normal without agitation  Micro: none  Cardiac tracings I have personally interpreted today:  none  Pertinent Radiological findings (summarize): none   Lab Results  Component Value Date   WBC 7.3 08/13/2023   HGB 12.0 12/19/2023   HCT 37.8 12/19/2023   PLT 231.0 08/13/2023   GLUCOSE 94 01/07/2024   CHOL 176 08/13/2023   TRIG 241.0 (H) 08/13/2023   HDL 63.70 08/13/2023   LDLDIRECT 89.0 01/31/2022   LDLCALC 64 08/13/2023   ALT 14 08/13/2023   AST 19  08/13/2023   NA 139 01/07/2024   K 4.5 01/07/2024   CL 104 01/07/2024   CREATININE 0.98 01/07/2024   BUN 17 01/07/2024   CO2 29 01/07/2024   TSH 1.30 01/07/2024   INR 2.0 01/08/2024   HGBA1C 5.9 08/13/2023   Assessment/Plan:  MARI BATTAGLIA is a 89 y.o. White or Caucasian [1] female with  has a past medical history of Atrial fibrillation (HCC), CAD (coronary artery disease), CAROTID ARTERY DISEASE, HTN (hypertension), Hyperlipidemia, LBBB (left bundle branch block), Macular degeneration, wet (HCC) (10/2012 dx), Obesity, and Vertigo.  Dysuria Exam c/w likelyl suboptimal tx uti despite being uncomplicated-  for cipro  500 bid x 10 days after repeat ua and cx today  Essential hypertension, benign BP Readings from Last 3 Encounters:  01/17/24 138/82  01/07/24 132/74  12/19/23 (!) 120/52   Stable, pt to continue medical treatment card CD 120 mg every day, lisinopril  10 every day, toprol  xl 50 qd   Urinary incontinence Mild recurrent recently, may be a risk factor for recurrent infections; pt to consider urology referral for worsening, and declines this today  Followup: Return if symptoms worsen or fail to improve.  Lynwood Rush, MD 01/17/2024 10:43 AM [facility]

## 2024-01-17 NOTE — Assessment & Plan Note (Addendum)
 Exam c/w likelyl suboptimal tx uti despite being uncomplicated-  for cipro  500 bid x 10 days after repeat ua and cx today

## 2024-01-17 NOTE — Patient Instructions (Signed)
 Please take all new medication as prescribed- the antibiotic  Please continue all other medications as before, and refills have been done if requested.  Please have the pharmacy call with any other refills you may need.  Please keep your appointments with your specialists as you may have planned  Please go to the LAB at the blood drawing area for the tests to be done - just the urinary testing todayt  You will be contacted by phone if any changes need to be made immediately.  Otherwise, you will receive a letter about your results with an explanation, but please check with MyChart first.

## 2024-01-17 NOTE — Assessment & Plan Note (Signed)
 BP Readings from Last 3 Encounters:  01/17/24 138/82  01/07/24 132/74  12/19/23 (!) 120/52   Stable, pt to continue medical treatment card CD 120 mg every day, lisinopril  10 every day, toprol  xl 50 qd

## 2024-01-17 NOTE — Assessment & Plan Note (Signed)
 Mild recurrent recently, may be a risk factor for recurrent infections; pt to consider urology referral for worsening, and declines this today

## 2024-01-18 LAB — URINE CULTURE: Result:: NO GROWTH

## 2024-01-23 ENCOUNTER — Telehealth: Payer: Self-pay | Admitting: *Deleted

## 2024-01-23 NOTE — Progress Notes (Signed)
 Care Guide Pharmacy Note  01/23/2024 Name: SOHANA AUSTELL MRN: [id number] DOB: 16-Aug-1930  Referred By: Geofm Glade PARAS, MD Reason for referral: Complex Care Management (Outreach to schedule referral with pharmacist )   HALIE GASS is a 89 y.o. year old female who is a primary care patient of Geofm Glade PARAS, MD.  KATIELYNN HORAN was referred to the pharmacist for assistance related to: Eliquis   Successful contact was made with the patient to discuss pharmacy services including being ready for the pharmacist to call at least 5 minutes before the scheduled appointment time and to have medication bottles and any blood pressure readings ready for review. The patient agreed to meet with the pharmacist via telephone visit on 02/08/2024  Thedford Franks, CMA [place]  [facility], [facility] Guide Direct Dial: [phone]  Fax: [phone] Website: [email or web address]

## 2024-01-24 ENCOUNTER — Telehealth: Payer: Self-pay

## 2024-01-24 NOTE — Telephone Encounter (Signed)
 Copied from CRM #[id number]. Topic: Clinical - Lab/Test Results >> Jan 24, 2024  9:01 AM Chasity T wrote: Reason for CRM: Luke daughter of patient is calling in for results on current test, advised results. She states that the patient is still having confusion which she was not having prior to the UTI. She wants to know if the confusion will go away with the antibiotics or what they can do to help it go away. Also states her daily activities are not the same, not fully sleeping fully at night. Please call back to discuss [phone]

## 2024-01-25 NOTE — Telephone Encounter (Signed)
 Since the urine culture was negative, antibiotic can be stopped  Please see Dr Geofm next wk if confusion persists, or ED if getting acutely worse

## 2024-01-25 NOTE — Telephone Encounter (Signed)
 Patients daughter has been made aware and gave a verbal understanding. We are scheduling the patient an appointment.

## 2024-01-27 ENCOUNTER — Encounter: Payer: Self-pay | Admitting: Internal Medicine

## 2024-01-27 NOTE — Progress Notes (Unsigned)
 Subjective:    Patient ID: Judy [name], female    DOB: 1931-02-10, 89 y.o.   MRN: [id number]      HPI Judy [name] is here for No chief complaint on file.  7/28-seen for urinary incontinence, confusion.  She was found to have an E. coli UTI.  She was treated with cefdinir -she did not see any improvement after 3 days and antibiotic was switched to Cipro  x 3 days.  8/7-seen here for dysuria.  UA questionable for UTI and started on Cipro  500 mg twice daily x 10 days.  Urine culture was negative.   Medications and allergies reviewed with patient and updated if appropriate.  Current Outpatient Medications on File Prior to Visit  Medication Sig Dispense Refill   acetaminophen (TYLENOL) 500 MG tablet Take 500 mg by mouth every 6 (six) hours as needed for mild pain (pain score 1-3) or moderate pain (pain score 4-6).     apixaban  (ELIQUIS ) 2.5 MG TABS tablet Take 1 tablet (2.5 mg total) by mouth 2 (two) times daily. 60 tablet 11   aspirin 81 MG tablet Take 81 mg by mouth daily.     azelastine  (ASTELIN ) 0.1 % nasal spray Place 2 sprays into the nose 2 (two) times daily as needed for rhinitis or allergies.     b complex vitamins tablet Take 1 tablet by mouth daily.     ciprofloxacin  (CIPRO ) 500 MG tablet Take 1 tablet (500 mg total) by mouth 2 (two) times daily for 10 days. 20 tablet 0   diltiazem  (CARDIZEM  CD) 120 MG 24 hr capsule TAKE 1 CAPSULE BY MOUTH EVERY DAY 90 capsule 2   furosemide  (LASIX ) 20 MG tablet Take 1 tablet (20 mg total) by mouth daily. 90 tablet 2   isosorbide  mononitrate (IMDUR ) 30 MG 24 hr tablet TAKE 1 TABLET BY MOUTH EVERYDAY AT BEDTIME 90 tablet 2   lisinopril  (ZESTRIL ) 10 MG tablet TAKE 1 TABLET BY MOUTH EVERY DAY 90 tablet 3   loratadine (CLARITIN) 10 MG tablet Take 10 mg by mouth daily.     metoprolol  succinate (TOPROL -XL) 50 MG 24 hr tablet TAKE 1 TABLET BY MOUTH EVERY DAY WITH OR IMMEDIATELY FOLLOWING A MEAL 90 tablet 2   Multiple Vitamin (MULTIVITAMIN WITH  MINERALS) TABS tablet Take 1 tablet by mouth daily.     nitroGLYCERIN  (NITROSTAT ) 0.4 MG SL tablet PLACE 1 TABLET UNDER TONGUE EVERY 5 MINUTES AS NEEDED FOR CHEST PAIN 25 tablet 8   potassium chloride  (MICRO-K ) 10 MEQ CR capsule TAKE 1 CAPSULE BY MOUTH EVERY DAY 90 capsule 2   Probiotic Product (PROBIOTIC ADVANCED PO) Take 1 tablet by mouth.     rosuvastatin  (CRESTOR ) 10 MG tablet TAKE 1 TABLET BY MOUTH ONCE DAILY 90 tablet 3   sodium chloride (OCEAN) 0.65 % SOLN nasal spray Place 1 spray into both nostrils as needed for congestion.     tobramycin (TOBREX) 0.3 % ophthalmic solution Place 1 drop into the right eye every 4 (four) hours.     No current facility-administered medications on file prior to visit.    Review of Systems     Objective:  There were no vitals filed for this visit. BP Readings from Last 3 Encounters:  01/17/24 138/82  01/07/24 132/74  12/19/23 (!) 120/52   Wt Readings from Last 3 Encounters:  01/17/24 143 lb (64.9 kg)  01/07/24 143 lb (64.9 kg)  12/19/23 146 lb 6.4 oz (66.4 kg)   There is no height or weight on  file to calculate BMI.    Physical Exam         Assessment & Plan:    See Problem List for Assessment and Plan of chronic medical problems.

## 2024-01-28 ENCOUNTER — Ambulatory Visit (INDEPENDENT_AMBULATORY_CARE_PROVIDER_SITE_OTHER): Admitting: Internal Medicine

## 2024-01-28 VITALS — BP 126/74 | HR 98 | Temp 98.2°F | Ht 62.0 in | Wt 135.0 lb

## 2024-01-28 DIAGNOSIS — R413 Other amnesia: Secondary | ICD-10-CM | POA: Insufficient documentation

## 2024-01-28 DIAGNOSIS — I10 Essential (primary) hypertension: Secondary | ICD-10-CM

## 2024-01-28 NOTE — Patient Instructions (Addendum)
     Start a UTI prevention supplement     A referral was ordered Neurology and someone will call you to schedule an appointment.     Return for cancel sept appt and follow up in 3 months.

## 2024-01-28 NOTE — Assessment & Plan Note (Signed)
 New There is some short-term memory issues per her daughter she has also had times where she has mixed up days and nights, but that was in the setting of urinary tract infection She stopped doing crossword puzzles MMSE low with routine annual wellness Probable memory impairment Referral to neurology TSH normal this year, B12 normal this year

## 2024-01-28 NOTE — Assessment & Plan Note (Signed)
 Chronic Blood pressure well controlled Continue metoprolol  XL 50 mg daily, lisinopril  10 mg daily, isosorbide  mononitrate 30 mg daily, diltiazem  120 mg daily

## 2024-01-29 DIAGNOSIS — H353223 Exudative age-related macular degeneration, left eye, with inactive scar: Secondary | ICD-10-CM | POA: Diagnosis not present

## 2024-01-29 DIAGNOSIS — H04123 Dry eye syndrome of bilateral lacrimal glands: Secondary | ICD-10-CM | POA: Diagnosis not present

## 2024-01-29 DIAGNOSIS — H353211 Exudative age-related macular degeneration, right eye, with active choroidal neovascularization: Secondary | ICD-10-CM | POA: Diagnosis not present

## 2024-01-29 DIAGNOSIS — H43813 Vitreous degeneration, bilateral: Secondary | ICD-10-CM | POA: Diagnosis not present

## 2024-01-29 DIAGNOSIS — H35372 Puckering of macula, left eye: Secondary | ICD-10-CM | POA: Diagnosis not present

## 2024-02-08 ENCOUNTER — Other Ambulatory Visit

## 2024-02-13 ENCOUNTER — Telehealth: Payer: Self-pay

## 2024-02-13 DIAGNOSIS — R413 Other amnesia: Secondary | ICD-10-CM

## 2024-02-13 NOTE — Telephone Encounter (Signed)
 Copied from CRM #[id number]. Topic: Referral - Question >> Feb 13, 2024 12:10 PM Precious C wrote: Reason for CRM: Patient's daughter called requesting another neurology referral for her mother. She stated that the originally referred location is booked until January of next year. She is requesting a new referral to another location so her mother can be seen sooner. Daughter would like a follow up call regarding next steps. Callback number: [phone].

## 2024-02-14 NOTE — Telephone Encounter (Signed)
 Message left for Luke to return call to clinic to discuss.  If she calls back please relay Dr. Geofm recommendations and see how she would like to proceed.

## 2024-02-14 NOTE — Telephone Encounter (Signed)
 Since she was seen at [facility] in the past [facility] will not likely accept her.    I can try referring to [facility] in [place] if she wants - she can also try calling over there to see how long it will take to get an appt -- it is not unusual to take 3 months or so to be seen

## 2024-02-15 ENCOUNTER — Other Ambulatory Visit: Payer: Self-pay | Admitting: Cardiovascular Disease

## 2024-02-15 MED ORDER — DILTIAZEM HCL ER COATED BEADS 120 MG PO CP24
120.0000 mg | ORAL_CAPSULE | Freq: Every day | ORAL | 2 refills | Status: AC
Start: 2024-02-15 — End: ?

## 2024-02-15 MED ORDER — POTASSIUM CHLORIDE ER 10 MEQ PO CPCR: 10.0000 meq | ORAL_CAPSULE | Freq: Every day | ORAL | 2 refills | Status: AC

## 2024-02-18 ENCOUNTER — Other Ambulatory Visit (INDEPENDENT_AMBULATORY_CARE_PROVIDER_SITE_OTHER): Admitting: Pharmacist

## 2024-02-18 DIAGNOSIS — I4891 Unspecified atrial fibrillation: Secondary | ICD-10-CM

## 2024-02-18 NOTE — Telephone Encounter (Unsigned)
 Copied from CRM #[id number]. Topic: Referral - Question >> Feb 13, 2024 12:10 PM Precious C wrote: Reason for CRM: Patient's daughter called requesting another neurology referral for her mother. She stated that the originally referred location is booked until January of next year. She is requesting a new referral to another location so her mother can be seen sooner. Daughter would like a follow up call regarding next steps. Callback number: [phone]. >> Feb 18, 2024  9:01 AM Robinson H wrote: Patients daughter following up on referral, advised patient of Dr. Geofm message as stated. Daughter states she didn't receive initial call states she wants to speak with someone from office.  Luke [phone]

## 2024-02-18 NOTE — Addendum Note (Signed)
 Addended by: GEOFM GLADE PARAS on: 02/18/2024 04:39 PM   Modules accepted: Orders

## 2024-02-18 NOTE — Progress Notes (Signed)
 02/18/2024 Name: Judy [name] MRN: [id number] DOB: 12/09/1930  Chief Complaint  Patient presents with   Medication Assistance    Judy [name] is a 89 y.o. year old female who presented for a telephone visit.   They were referred to the pharmacist by their PCP for assistance in managing medication access.   Subjective:  Care Team: Primary Care Provider: Geofm Glade PARAS, MD ; Next Scheduled Visit: 04/29/24  Medication Access/Adherence  Current Pharmacy:  [phone] - [place], [place] - [address] [place] [address] Phone: [phone] Fax: [phone]  [facility] [id number] - [place], [place] - [address] [place] [address] Phone: [phone] Fax: [phone]   Patient reports affordability concerns with their medications: Yes  Patient reports access/transportation concerns to their pharmacy: No  Patient reports adherence concerns with their medications:  No    Pt was changed from warfarin to Eliquis  back in July. She required 3 months of Eliquis  for free and just recently got 30 DS filled at the pharmacy which was $47.  Pt's daughter also asked for information regarding how pill packaging through [facility] works.   Objective:  Lab Results  Component Value Date   HGBA1C 5.9 08/13/2023    Lab Results  Component Value Date   CREATININE 0.98 01/07/2024   BUN 17 01/07/2024   NA 139 01/07/2024   K 4.5 01/07/2024   CL 104 01/07/2024   CO2 29 01/07/2024    Lab Results  Component Value Date   CHOL 176 08/13/2023   HDL 63.70 08/13/2023   LDLCALC 64 08/13/2023   LDLDIRECT 89.0 01/31/2022   TRIG 241.0 (H) 08/13/2023   CHOLHDL 3 08/13/2023    Medications Reviewed Today     Reviewed by Merceda Lela SAUNDERS, RPH (Pharmacist) on 02/18/24 at 1025  Med List Status: <None>   Medication Order Taking? Sig Documenting Provider Last Dose Status Informant  acetaminophen (TYLENOL) 500 MG tablet  [id number]  Take 500 mg by mouth every 6 (six) hours as needed for mild pain (pain score 1-3) or moderate pain (pain score 4-6). [provider]  Active Self  apixaban  (ELIQUIS ) 2.5 MG TABS tablet [id number] Yes TAKE 1 TABLET BY MOUTH TWICE A DAY Nishan, Peter C, MD  Active   aspirin 81 MG tablet [id number]  Take 81 mg by mouth daily. [provider]  Active Self  azelastine  (ASTELIN ) 0.1 % nasal spray [id number]  Place 2 sprays into the nose 2 (two) times daily as needed for rhinitis or allergies. [provider]  Active Self  b complex vitamins tablet [id number]  Take 1 tablet by mouth daily. Geofm Glade PARAS, MD  Active Self  diltiazem  (CARDIZEM  CD) 120 MG 24 hr capsule [id number]  Take 1 capsule (120 mg total) by mouth daily. Nishan, Peter C, MD  Active   furosemide  (LASIX ) 20 MG tablet [id number]  Take 1 tablet (20 mg total) by mouth daily. Nishan, Peter C, MD  Active   isosorbide  mononitrate (IMDUR ) 30 MG 24 hr tablet [id number]  TAKE 1 TABLET BY MOUTH EVERYDAY AT BEDTIME Nishan, Peter C, MD  Active   lisinopril  (ZESTRIL ) 10 MG tablet [id number]  TAKE 1 TABLET BY MOUTH EVERY DAY Nishan, Peter C, MD  Active   loratadine (CLARITIN) 10 MG tablet [id number]  Take 10 mg by mouth daily. [provider]  Active   metoprolol  succinate (TOPROL -XL) 50 MG 24 hr tablet [id number]  TAKE 1 TABLET  BY MOUTH EVERY DAY WITH OR IMMEDIATELY FOLLOWING A MEAL Nishan, Peter C, MD  Active   Multiple Vitamin (MULTIVITAMIN WITH MINERALS) TABS tablet [id number]  Take 1 tablet by mouth daily. [provider]  Active Self  nitroGLYCERIN  (NITROSTAT ) 0.4 MG SL tablet [id number]  PLACE 1 TABLET UNDER TONGUE EVERY 5 MINUTES AS NEEDED FOR CHEST PAIN Burns, Glade PARAS, MD  Active   omeprazole (PRILOSEC) 20 MG capsule [id number]  Take 20 mg by mouth daily. [provider]  Active   potassium chloride  (MICRO-K ) 10 MEQ CR capsule [id number]  Take 1 capsule (10 mEq total) by mouth daily. Nishan, Peter  C, MD  Active   Probiotic Product (PROBIOTIC ADVANCED PO) [id number]  Take 1 tablet by mouth. [provider]  Active   rosuvastatin  (CRESTOR ) 10 MG tablet [id number]  TAKE 1 TABLET BY MOUTH ONCE DAILY Nishan, Peter C, MD  Active   sodium chloride (OCEAN) 0.65 % SOLN nasal spray [id number]  Place 1 spray into both nostrils as needed for congestion. [provider]  Active Self              Assessment/Plan:   Medication Access: There is currently no patient assistance program available for Medicare patients Gave information regarding ExtraHelp eligibility and Xarelto PAP It appears she has the Part D [employer] Plan with no drug deductible therefore price should stay $47 for 30 DS if the plans remains the same next year. Pt's daughter notes she believes they can manage if price remains $47 per month Gave information regarding pill packaging through [facility]  Follow Up Plan: PRN  Darrelyn Drum, PharmD, BCPS, CPP Clinical Pharmacist Practitioner [facility] [phone]

## 2024-02-18 NOTE — Patient Instructions (Signed)
 It was a pleasure speaking with you today!  Unfortunately there is no Eliquis  patient assistance option for Medicare patients. You may qualify for ExtraHelp through [employer] if you makes less than $23,500 per year. You may qualify for Xarelto patient assistance if making between $30,000-$50,000 per year.   Feel free to call with any questions or concerns!  Darrelyn Drum, PharmD, BCPS, CPP Clinical Pharmacist Practitioner [facility] [phone]

## 2024-02-18 NOTE — Telephone Encounter (Signed)
 Prescription refill request for Eliquis  received. Indication: AF Last office visit: 12/19/23  SHAUNNA Emmer MD Scr: 0.98 on 01/07/24  Epic Age: 89 Weight: 66.4kg  Continue Eliquis  2.5mg  twice daily per recent MD office visit.

## 2024-02-18 NOTE — Telephone Encounter (Signed)
 Referral ordered for [facility] neurology

## 2024-02-19 ENCOUNTER — Encounter: Payer: Self-pay | Admitting: Physician Assistant

## 2024-02-19 ENCOUNTER — Other Ambulatory Visit

## 2024-02-22 ENCOUNTER — Other Ambulatory Visit (HOSPITAL_COMMUNITY): Payer: Self-pay

## 2024-02-25 ENCOUNTER — Ambulatory Visit: Admitting: Internal Medicine

## 2024-02-25 ENCOUNTER — Encounter: Payer: Self-pay | Admitting: Internal Medicine

## 2024-02-25 VITALS — BP 112/80 | HR 100 | Temp 98.2°F | Ht 62.0 in | Wt 135.0 lb

## 2024-02-25 DIAGNOSIS — J069 Acute upper respiratory infection, unspecified: Secondary | ICD-10-CM

## 2024-02-25 DIAGNOSIS — M542 Cervicalgia: Secondary | ICD-10-CM | POA: Diagnosis not present

## 2024-02-25 DIAGNOSIS — I10 Essential (primary) hypertension: Secondary | ICD-10-CM | POA: Diagnosis not present

## 2024-02-25 NOTE — Assessment & Plan Note (Signed)
 Acute Started today and she believes she may have slept wrong which is likely the cause She started to have some left ear pain as well and pain going from the neck into the head Discussed this is all likely related to her neck pain Ear exam normal Pain is mild Advised symptomatic treatment with Tylenol, ice, heat and topical medications If pain is not improving advised to let me know, but would like to avoid prescription medication given possible side effects and her age

## 2024-02-25 NOTE — Patient Instructions (Addendum)
       Medications changes include :   None - continue mucinex for the cough.  Treat your neck pain with tylenol, heat, ice and topical medications.       Return if symptoms worsen or fail to improve.

## 2024-02-25 NOTE — Assessment & Plan Note (Signed)
 Acute Symptoms likely viral in nature No need for any antibiotic or any other prescription medication Continue symptomatic treatment with over-the-counter cold medications, Tylenol/ibuprofen Increase rest and fluids Call if symptoms worsen or do not improve

## 2024-02-25 NOTE — Assessment & Plan Note (Signed)
 Chronic Blood pressure well controlled Continue metoprolol  XL 50 mg daily, lisinopril  10 mg daily, isosorbide  mononitrate 30 mg daily, diltiazem  120 mg daily

## 2024-02-25 NOTE — Progress Notes (Signed)
 Subjective:    Patient ID: Judy [name], female    DOB: 01/12/1931, 89 y.o.   MRN: [id number]      HPI [name] is here for  Chief Complaint  Patient presents with   Nasal Congestion    Taking Mucinex on and off for about 3-4 weeks   Ear Pain    Left ear pain (would like both ears checked)   Discussed the use of AI scribe software for clinical note transcription with the patient, who gave verbal consent to proceed.  History of Present Illness Judy [name] is a 89 year old female who presents with neck pain and persistent cough.  She experiences neck pain that is not severe but occurs when she turns her head in certain directions.  The neck pain started today and she thinks she may have slept wrong.  The pain is described as shooting into her head.  No current ear pain, but there is a sensation of pain that runs into her head from her neck.  She has been experiencing a persistent cough for about three weeks. The cough produces clear or white mucus, which she is able to expectorate with the help of Mucinex. No nasal congestion, sore throat, sinus pain, or shortness of breath. Occasionally, there is drainage down the back of her throat if she does not cough up all the mucus.  No fevers, chills, or headaches. She feels cold all the time, particularly in her hands and feet, which is her baseline. Occasional lightheadedness or dizziness, especially when getting up from a seated position, which she attributes to her age.  Her appetite is variable, and she sometimes has to make herself eat.       Medications and allergies reviewed with patient and updated if appropriate.  Current Outpatient Medications on File Prior to Visit  Medication Sig Dispense Refill   acetaminophen (TYLENOL) 500 MG tablet Take 500 mg by mouth every 6 (six) hours as needed for mild pain (pain score 1-3) or moderate pain (pain score 4-6).     apixaban  (ELIQUIS ) 2.5 MG TABS tablet TAKE 1 TABLET BY MOUTH  TWICE A DAY 60 tablet 5   aspirin 81 MG tablet Take 81 mg by mouth daily.     azelastine  (ASTELIN ) 0.1 % nasal spray Place 2 sprays into the nose 2 (two) times daily as needed for rhinitis or allergies.     b complex vitamins tablet Take 1 tablet by mouth daily.     diltiazem  (CARDIZEM  CD) 120 MG 24 hr capsule Take 1 capsule (120 mg total) by mouth daily. 90 capsule 2   furosemide  (LASIX ) 20 MG tablet Take 1 tablet (20 mg total) by mouth daily. 90 tablet 2   isosorbide  mononitrate (IMDUR ) 30 MG 24 hr tablet TAKE 1 TABLET BY MOUTH EVERYDAY AT BEDTIME 90 tablet 2   lisinopril  (ZESTRIL ) 10 MG tablet TAKE 1 TABLET BY MOUTH EVERY DAY 90 tablet 3   loratadine (CLARITIN) 10 MG tablet Take 10 mg by mouth daily.     metoprolol  succinate (TOPROL -XL) 50 MG 24 hr tablet TAKE 1 TABLET BY MOUTH EVERY DAY WITH OR IMMEDIATELY FOLLOWING A MEAL 90 tablet 2   Multiple Vitamin (MULTIVITAMIN WITH MINERALS) TABS tablet Take 1 tablet by mouth daily.     nitroGLYCERIN  (NITROSTAT ) 0.4 MG SL tablet PLACE 1 TABLET UNDER TONGUE EVERY 5 MINUTES AS NEEDED FOR CHEST PAIN 25 tablet 8   omeprazole (PRILOSEC) 20 MG capsule Take 20 mg by mouth  daily.     potassium chloride  (MICRO-K ) 10 MEQ CR capsule Take 1 capsule (10 mEq total) by mouth daily. 90 capsule 2   Probiotic Product (PROBIOTIC ADVANCED PO) Take 1 tablet by mouth.     rosuvastatin  (CRESTOR ) 10 MG tablet TAKE 1 TABLET BY MOUTH ONCE DAILY 90 tablet 3   sodium chloride (OCEAN) 0.65 % SOLN nasal spray Place 1 spray into both nostrils as needed for congestion.     No current facility-administered medications on file prior to visit.    Review of Systems  Constitutional:  Negative for chills and fever.  HENT:  Positive for ear pain (left ear pain) and postnasal drip (sometimes). Negative for congestion, sinus pain and sore throat.   Respiratory:  Positive for cough (productive x 3 weeks - clear - white). Negative for shortness of breath and wheezing.   Gastrointestinal:   Negative for diarrhea and nausea.  Musculoskeletal:  Positive for neck pain (today - slept wrong).  Neurological:  Positive for dizziness (occ - when standing). Negative for light-headedness and headaches.       Objective:   Vitals:   02/25/24 1508  BP: 112/80  Pulse: 100  Temp: 98.2 F (36.8 C)  SpO2: 100%   BP Readings from Last 3 Encounters:  02/25/24 112/80  01/28/24 126/74  01/17/24 138/82   Wt Readings from Last 3 Encounters:  02/25/24 135 lb (61.2 kg)  01/28/24 135 lb (61.2 kg)  01/17/24 143 lb (64.9 kg)   Body mass index is 24.69 kg/m.    Physical Exam Constitutional:      General: She is not in acute distress.    Appearance: Normal appearance. She is not ill-appearing.  HENT:     Head: Normocephalic and atraumatic.     Right Ear: Tympanic membrane, ear canal and external ear normal.     Left Ear: Tympanic membrane, ear canal and external ear normal.     Mouth/Throat:     Mouth: Mucous membranes are moist.     Pharynx: No oropharyngeal exudate or posterior oropharyngeal erythema.  Eyes:     Conjunctiva/sclera: Conjunctivae normal.  Cardiovascular:     Rate and Rhythm: Normal rate and regular rhythm.  Pulmonary:     Effort: Pulmonary effort is normal. No respiratory distress.     Breath sounds: Normal breath sounds. No wheezing or rales.  Musculoskeletal:     Cervical back: Neck supple. No tenderness.  Lymphadenopathy:     Cervical: No cervical adenopathy.  Skin:    General: Skin is warm and dry.  Neurological:     Mental Status: She is alert.            Assessment & Plan:    See Problem List for Assessment and Plan of chronic medical problems.

## 2024-02-26 ENCOUNTER — Ambulatory Visit: Admitting: Internal Medicine

## 2024-03-25 ENCOUNTER — Ambulatory Visit

## 2024-03-25 ENCOUNTER — Telehealth: Payer: Self-pay

## 2024-03-25 DIAGNOSIS — Z23 Encounter for immunization: Secondary | ICD-10-CM

## 2024-03-25 NOTE — Telephone Encounter (Signed)
 Contacted Kim, who reports pt would prefer flu vaccine from this nurse since she was a prior coumadin  clinic pt.  Scheduled on [place] schedule for 1 pm today. Kim verbalized understanding.

## 2024-03-25 NOTE — Telephone Encounter (Signed)
 Copied from CRM [id number]. Topic: Appointments - Scheduling Inquiry for Clinic >> Mar 25, 2024  9:09 AM Adelita E wrote: Reason for CRM: Patient's daughter, Luke, called in to cancel patient's flu shot for tomorrow 10/15, as Luke would like this done with Clotilda in the Coumadin  clinic instead as patient used to see that provider. Please call daughter, Luke [phone] to confirm as they would prefer to come in today to have this done.

## 2024-03-25 NOTE — Progress Notes (Signed)
 Pt here for high dose flu vaccine. Administered vaccine. Pt tolerated well.

## 2024-03-26 ENCOUNTER — Ambulatory Visit

## 2024-04-11 DIAGNOSIS — M25562 Pain in left knee: Secondary | ICD-10-CM | POA: Diagnosis not present

## 2024-04-15 ENCOUNTER — Encounter: Payer: Self-pay | Admitting: Internal Medicine

## 2024-04-15 ENCOUNTER — Ambulatory Visit (INDEPENDENT_AMBULATORY_CARE_PROVIDER_SITE_OTHER): Admitting: Internal Medicine

## 2024-04-15 VITALS — BP 122/68 | HR 71 | Temp 98.6°F | Ht 62.0 in

## 2024-04-15 DIAGNOSIS — R300 Dysuria: Secondary | ICD-10-CM

## 2024-04-15 DIAGNOSIS — H353223 Exudative age-related macular degeneration, left eye, with inactive scar: Secondary | ICD-10-CM | POA: Diagnosis not present

## 2024-04-15 DIAGNOSIS — N3000 Acute cystitis without hematuria: Secondary | ICD-10-CM | POA: Diagnosis not present

## 2024-04-15 DIAGNOSIS — H353211 Exudative age-related macular degeneration, right eye, with active choroidal neovascularization: Secondary | ICD-10-CM | POA: Diagnosis not present

## 2024-04-15 DIAGNOSIS — H35372 Puckering of macula, left eye: Secondary | ICD-10-CM | POA: Diagnosis not present

## 2024-04-15 DIAGNOSIS — I10 Essential (primary) hypertension: Secondary | ICD-10-CM

## 2024-04-15 DIAGNOSIS — H04123 Dry eye syndrome of bilateral lacrimal glands: Secondary | ICD-10-CM | POA: Diagnosis not present

## 2024-04-15 DIAGNOSIS — H43813 Vitreous degeneration, bilateral: Secondary | ICD-10-CM | POA: Diagnosis not present

## 2024-04-15 LAB — POC URINALSYSI DIPSTICK (AUTOMATED)
Bilirubin, UA: NEGATIVE
Blood, UA: NEGATIVE
Glucose, UA: NEGATIVE
Ketones, UA: NEGATIVE
Leukocytes, UA: NEGATIVE
Nitrite, UA: NEGATIVE
Protein, UA: POSITIVE — AB
Spec Grav, UA: 1.025 (ref 1.010–1.025)
Urobilinogen, UA: 0.2 U/dL
pH, UA: 5.5 (ref 5.0–8.0)

## 2024-04-15 MED ORDER — AMOXICILLIN-POT CLAVULANATE 875-125 MG PO TABS: 1.0000 | ORAL_TABLET | Freq: Two times a day (BID) | ORAL | 0 refills | Status: AC

## 2024-04-15 NOTE — Assessment & Plan Note (Signed)
 Chronic Blood pressure well controlled Continue metoprolol  XL 50 mg daily, lisinopril  10 mg daily, isosorbide  mononitrate 30 mg daily, diltiazem  120 mg daily

## 2024-04-15 NOTE — Patient Instructions (Addendum)
       Medications changes include :   Augmentin  x 1 week      Return if symptoms worsen or fail to improve.

## 2024-04-15 NOTE — Assessment & Plan Note (Signed)
 Acute Her symptoms are convincing for urinary tract infection Last UTI July was a significant infection Urine dip here negative for infection Will send urine downstairs for urinalysis and urine culture Given her symptoms and history and we will empirically start her on an antibiotic-Augmentin  875-125 mg twice daily x 7 days I think the benefits of being on empiric antibiotic at her age outweigh the potential risks If culture is negative will stop the antibiotic Work on increasing water intake

## 2024-04-15 NOTE — Progress Notes (Signed)
 Subjective:    Patient ID: Judy [name], female    DOB: 1931-04-08, 89 y.o.   MRN: [id number]      HPI Judy [name] is here for  Chief Complaint  Patient presents with   Urinary Tract Infection    Urinary frequency and burning  She is here today with her daughter.  For the past 1-2 weeks she has had increased urinary freuqncy, urgency, dysuria.  She denies any fever, abdominal pain, nausea, back pain or blood in urine.  She has not noted any cloudy urine or urine odor.  She did have a significant urinary tract infection in July and they were concerned about a recurrence.   Udip - + protein, everything else neg  Medications and allergies reviewed with patient and updated if appropriate.  Current Outpatient Medications on File Prior to Visit  Medication Sig Dispense Refill   acetaminophen (TYLENOL) 500 MG tablet Take 500 mg by mouth every 6 (six) hours as needed for mild pain (pain score 1-3) or moderate pain (pain score 4-6).     apixaban  (ELIQUIS ) 2.5 MG TABS tablet TAKE 1 TABLET BY MOUTH TWICE A DAY 60 tablet 5   aspirin 81 MG tablet Take 81 mg by mouth daily.     azelastine  (ASTELIN ) 0.1 % nasal spray Place 2 sprays into the nose 2 (two) times daily as needed for rhinitis or allergies.     b complex vitamins tablet Take 1 tablet by mouth daily.     diltiazem  (CARDIZEM  CD) 120 MG 24 hr capsule Take 1 capsule (120 mg total) by mouth daily. 90 capsule 2   furosemide  (LASIX ) 20 MG tablet Take 1 tablet (20 mg total) by mouth daily. 90 tablet 2   isosorbide  mononitrate (IMDUR ) 30 MG 24 hr tablet TAKE 1 TABLET BY MOUTH EVERYDAY AT BEDTIME 90 tablet 2   lisinopril  (ZESTRIL ) 10 MG tablet TAKE 1 TABLET BY MOUTH EVERY DAY 90 tablet 3   loratadine (CLARITIN) 10 MG tablet Take 10 mg by mouth daily.     metoprolol  succinate (TOPROL -XL) 50 MG 24 hr tablet TAKE 1 TABLET BY MOUTH EVERY DAY WITH OR IMMEDIATELY FOLLOWING A MEAL 90 tablet 2   Multiple Vitamin (MULTIVITAMIN WITH MINERALS) TABS  tablet Take 1 tablet by mouth daily.     nitroGLYCERIN  (NITROSTAT ) 0.4 MG SL tablet PLACE 1 TABLET UNDER TONGUE EVERY 5 MINUTES AS NEEDED FOR CHEST PAIN 25 tablet 8   omeprazole (PRILOSEC) 20 MG capsule Take 20 mg by mouth daily.     potassium chloride  (MICRO-K ) 10 MEQ CR capsule Take 1 capsule (10 mEq total) by mouth daily. 90 capsule 2   Probiotic Product (PROBIOTIC ADVANCED PO) Take 1 tablet by mouth.     rosuvastatin  (CRESTOR ) 10 MG tablet TAKE 1 TABLET BY MOUTH ONCE DAILY 90 tablet 3   sodium chloride (OCEAN) 0.65 % SOLN nasal spray Place 1 spray into both nostrils as needed for congestion.     No current facility-administered medications on file prior to visit.    Review of Systems  Constitutional:  Negative for fever.  Gastrointestinal:  Negative for abdominal pain and nausea.  Genitourinary:  Positive for dysuria, frequency and urgency. Negative for hematuria.       No urine odor or cloudiness       Objective:   Vitals:   04/15/24 1511  BP: 122/68  Pulse: 71  Temp: 98.6 F (37 C)  SpO2: 100%   BP Readings from Last 3 Encounters:  04/15/24 122/68  02/25/24 112/80  01/28/24 126/74   Wt Readings from Last 3 Encounters:  02/25/24 135 lb (61.2 kg)  01/28/24 135 lb (61.2 kg)  01/17/24 143 lb (64.9 kg)   Body mass index is 24.69 kg/m.    Physical Exam Constitutional:      General: She is not in acute distress.    Appearance: Normal appearance. She is not ill-appearing.  HENT:     Head: Normocephalic.  Eyes:     Conjunctiva/sclera: Conjunctivae normal.  Abdominal:     General: There is no distension.     Palpations: Abdomen is soft.     Tenderness: There is no abdominal tenderness. There is no right CVA tenderness, left CVA tenderness, guarding or rebound.  Skin:    General: Skin is warm and dry.  Neurological:     Mental Status: She is alert.            Assessment & Plan:    See Problem List for Assessment and Plan of chronic medical problems.

## 2024-04-16 ENCOUNTER — Ambulatory Visit: Payer: Self-pay | Admitting: Internal Medicine

## 2024-04-16 LAB — URINALYSIS, ROUTINE W REFLEX MICROSCOPIC
Bilirubin Urine: NEGATIVE
Hgb urine dipstick: NEGATIVE
Ketones, ur: NEGATIVE
Leukocytes,Ua: NEGATIVE
Nitrite: NEGATIVE
RBC / HPF: NONE SEEN (ref 0–?)
Specific Gravity, Urine: 1.025 (ref 1.000–1.030)
Urine Glucose: NEGATIVE
Urobilinogen, UA: 0.2 (ref 0.0–1.0)
pH: 5 (ref 5.0–8.0)

## 2024-04-17 LAB — CULTURE, URINE COMPREHENSIVE

## 2024-04-22 ENCOUNTER — Other Ambulatory Visit

## 2024-04-22 DIAGNOSIS — I10 Essential (primary) hypertension: Secondary | ICD-10-CM

## 2024-04-22 DIAGNOSIS — E782 Mixed hyperlipidemia: Secondary | ICD-10-CM

## 2024-04-22 DIAGNOSIS — N1831 Chronic kidney disease, stage 3a: Secondary | ICD-10-CM

## 2024-04-22 DIAGNOSIS — E1169 Type 2 diabetes mellitus with other specified complication: Secondary | ICD-10-CM | POA: Diagnosis not present

## 2024-04-22 DIAGNOSIS — I4891 Unspecified atrial fibrillation: Secondary | ICD-10-CM

## 2024-04-22 LAB — CBC WITH DIFFERENTIAL/PLATELET
Basophils Absolute: 0.1 K/uL (ref 0.0–0.1)
Basophils Relative: 0.6 % (ref 0.0–3.0)
Eosinophils Absolute: 0.1 K/uL (ref 0.0–0.7)
Eosinophils Relative: 1.4 % (ref 0.0–5.0)
HCT: 35 % — ABNORMAL LOW (ref 36.0–46.0)
Hemoglobin: 11.8 g/dL — ABNORMAL LOW (ref 12.0–15.0)
Lymphocytes Relative: 25.7 % (ref 12.0–46.0)
Lymphs Abs: 2.4 K/uL (ref 0.7–4.0)
MCHC: 33.6 g/dL (ref 30.0–36.0)
MCV: 91.8 fl (ref 78.0–100.0)
Monocytes Absolute: 0.8 K/uL (ref 0.1–1.0)
Monocytes Relative: 8.3 % (ref 3.0–12.0)
Neutro Abs: 6 K/uL (ref 1.4–7.7)
Neutrophils Relative %: 64 % (ref 43.0–77.0)
Platelets: 217 K/uL (ref 150.0–400.0)
RBC: 3.81 Mil/uL — ABNORMAL LOW (ref 3.87–5.11)
RDW: 14.4 % (ref 11.5–15.5)
WBC: 9.4 K/uL (ref 4.0–10.5)

## 2024-04-22 LAB — COMPREHENSIVE METABOLIC PANEL WITH GFR
ALT: 11 U/L (ref 0–35)
AST: 16 U/L (ref 0–37)
Albumin: 4.1 g/dL (ref 3.5–5.2)
Alkaline Phosphatase: 60 U/L (ref 39–117)
BUN: 15 mg/dL (ref 6–23)
CO2: 27 meq/L (ref 19–32)
Calcium: 9.4 mg/dL (ref 8.4–10.5)
Chloride: 108 meq/L (ref 96–112)
Creatinine, Ser: 1.16 mg/dL (ref 0.40–1.20)
GFR: 41.16 mL/min — ABNORMAL LOW (ref >60.00)
Glucose, Bld: 95 mg/dL (ref 70–99)
Potassium: 4 meq/L (ref 3.5–5.1)
Sodium: 143 meq/L (ref 135–145)
Total Bilirubin: 0.4 mg/dL (ref 0.2–1.2)
Total Protein: 6.5 g/dL (ref 6.0–8.3)

## 2024-04-22 LAB — LIPID PANEL
Cholesterol: 158 mg/dL (ref 0–200)
HDL: 53.7 mg/dL (ref >39.00)
LDL Cholesterol: 73 mg/dL (ref 0–99)
NonHDL: 103.86
Total CHOL/HDL Ratio: 3
Triglycerides: 154 mg/dL — ABNORMAL HIGH (ref 0.0–149.0)
VLDL: 30.8 mg/dL (ref 0.0–40.0)

## 2024-04-22 LAB — HEMOGLOBIN A1C: Hgb A1c MFr Bld: 5.8 % (ref 4.6–6.5)

## 2024-04-23 ENCOUNTER — Ambulatory Visit: Payer: Self-pay | Admitting: Internal Medicine

## 2024-04-28 ENCOUNTER — Encounter: Payer: Self-pay | Admitting: Internal Medicine

## 2024-04-28 NOTE — Patient Instructions (Addendum)
      Medications changes include :   None      Return in about 6 months (around 10/27/2024) for follow up.

## 2024-04-28 NOTE — Progress Notes (Unsigned)
 Subjective:    Patient ID: Judy [name], female    DOB: June 08, 1931, 89 y.o.   MRN: [id number]     HPI Judy [name] is here for follow up of her chronic medical problems.  I saw her earlier this month for acute UTI-had blood work done then.  Referred to neurology for memory problems-has an appointment 12/8  Medications and allergies reviewed with patient and updated if appropriate.  Current Outpatient Medications on File Prior to Visit  Medication Sig Dispense Refill   acetaminophen (TYLENOL) 500 MG tablet Take 500 mg by mouth every 6 (six) hours as needed for mild pain (pain score 1-3) or moderate pain (pain score 4-6).     apixaban  (ELIQUIS ) 2.5 MG TABS tablet TAKE 1 TABLET BY MOUTH TWICE A DAY 60 tablet 5   aspirin 81 MG tablet Take 81 mg by mouth daily.     azelastine  (ASTELIN ) 0.1 % nasal spray Place 2 sprays into the nose 2 (two) times daily as needed for rhinitis or allergies.     b complex vitamins tablet Take 1 tablet by mouth daily.     diltiazem  (CARDIZEM  CD) 120 MG 24 hr capsule Take 1 capsule (120 mg total) by mouth daily. 90 capsule 2   furosemide  (LASIX ) 20 MG tablet Take 1 tablet (20 mg total) by mouth daily. 90 tablet 2   isosorbide  mononitrate (IMDUR ) 30 MG 24 hr tablet TAKE 1 TABLET BY MOUTH EVERYDAY AT BEDTIME 90 tablet 2   lisinopril  (ZESTRIL ) 10 MG tablet TAKE 1 TABLET BY MOUTH EVERY DAY 90 tablet 3   loratadine (CLARITIN) 10 MG tablet Take 10 mg by mouth daily.     metoprolol  succinate (TOPROL -XL) 50 MG 24 hr tablet TAKE 1 TABLET BY MOUTH EVERY DAY WITH OR IMMEDIATELY FOLLOWING A MEAL 90 tablet 2   Multiple Vitamin (MULTIVITAMIN WITH MINERALS) TABS tablet Take 1 tablet by mouth daily.     nitroGLYCERIN  (NITROSTAT ) 0.4 MG SL tablet PLACE 1 TABLET UNDER TONGUE EVERY 5 MINUTES AS NEEDED FOR CHEST PAIN 25 tablet 8   omeprazole (PRILOSEC) 20 MG capsule Take 20 mg by mouth daily.     potassium chloride  (MICRO-K ) 10 MEQ CR capsule Take 1 capsule (10 mEq total) by  mouth daily. 90 capsule 2   Probiotic Product (PROBIOTIC ADVANCED PO) Take 1 tablet by mouth.     rosuvastatin  (CRESTOR ) 10 MG tablet TAKE 1 TABLET BY MOUTH ONCE DAILY 90 tablet 3   sodium chloride (OCEAN) 0.65 % SOLN nasal spray Place 1 spray into both nostrils as needed for congestion.     No current facility-administered medications on file prior to visit.     Review of Systems     Objective:  There were no vitals filed for this visit. BP Readings from Last 3 Encounters:  04/15/24 122/68  02/25/24 112/80  01/28/24 126/74   Wt Readings from Last 3 Encounters:  02/25/24 135 lb (61.2 kg)  01/28/24 135 lb (61.2 kg)  01/17/24 143 lb (64.9 kg)   There is no height or weight on file to calculate BMI.    Physical Exam     Lab Results  Component Value Date   WBC 9.4 04/22/2024   HGB 11.8 (L) 04/22/2024   HCT 35.0 (L) 04/22/2024   PLT 217.0 04/22/2024   GLUCOSE 95 04/22/2024   CHOL 158 04/22/2024   TRIG 154.0 (H) 04/22/2024   HDL 53.70 04/22/2024   LDLDIRECT 89.0 01/31/2022   LDLCALC 73 04/22/2024  ALT 11 04/22/2024   AST 16 04/22/2024   NA 143 04/22/2024   K 4.0 04/22/2024   CL 108 04/22/2024   CREATININE 1.16 04/22/2024   BUN 15 04/22/2024   CO2 27 04/22/2024   TSH 1.30 01/07/2024   INR 2.0 01/08/2024   HGBA1C 5.8 04/22/2024     Assessment & Plan:    See Problem List for Assessment and Plan of chronic medical problems.

## 2024-04-29 ENCOUNTER — Ambulatory Visit: Admitting: Internal Medicine

## 2024-04-29 VITALS — BP 122/60 | HR 67 | Temp 97.9°F | Ht 62.0 in | Wt 135.0 lb

## 2024-04-29 DIAGNOSIS — N1832 Chronic kidney disease, stage 3b: Secondary | ICD-10-CM | POA: Diagnosis not present

## 2024-04-29 DIAGNOSIS — E1122 Type 2 diabetes mellitus with diabetic chronic kidney disease: Secondary | ICD-10-CM

## 2024-04-29 DIAGNOSIS — E1159 Type 2 diabetes mellitus with other circulatory complications: Secondary | ICD-10-CM | POA: Diagnosis not present

## 2024-04-29 DIAGNOSIS — Z8673 Personal history of transient ischemic attack (TIA), and cerebral infarction without residual deficits: Secondary | ICD-10-CM | POA: Diagnosis not present

## 2024-04-29 DIAGNOSIS — R040 Epistaxis: Secondary | ICD-10-CM | POA: Diagnosis not present

## 2024-04-29 DIAGNOSIS — D692 Other nonthrombocytopenic purpura: Secondary | ICD-10-CM

## 2024-04-29 DIAGNOSIS — I482 Chronic atrial fibrillation, unspecified: Secondary | ICD-10-CM

## 2024-04-29 DIAGNOSIS — E785 Hyperlipidemia, unspecified: Secondary | ICD-10-CM | POA: Diagnosis not present

## 2024-04-29 DIAGNOSIS — E1169 Type 2 diabetes mellitus with other specified complication: Secondary | ICD-10-CM

## 2024-04-29 DIAGNOSIS — I152 Hypertension secondary to endocrine disorders: Secondary | ICD-10-CM

## 2024-04-29 DIAGNOSIS — I6523 Occlusion and stenosis of bilateral carotid arteries: Secondary | ICD-10-CM

## 2024-04-29 DIAGNOSIS — E538 Deficiency of other specified B group vitamins: Secondary | ICD-10-CM | POA: Diagnosis not present

## 2024-04-29 DIAGNOSIS — I2510 Atherosclerotic heart disease of native coronary artery without angina pectoris: Secondary | ICD-10-CM

## 2024-04-29 DIAGNOSIS — G6289 Other specified polyneuropathies: Secondary | ICD-10-CM

## 2024-04-29 DIAGNOSIS — R413 Other amnesia: Secondary | ICD-10-CM

## 2024-04-29 NOTE — Assessment & Plan Note (Signed)
 Chronic Blood pressure well controlled Continue metoprolol  XL 50 mg daily, lisinopril  10 mg daily, isosorbide  mononitrate 30 mg daily, diltiazem  120 mg daily

## 2024-04-29 NOTE — Assessment & Plan Note (Signed)
 Acute Had 2 nosebleeds in the past couple of weeks She is not sure if they came from both nostril forgets 1 nostril Advised humidifier in bedroom at night, start using saline nasal spray a couple of times a day, Vaseline in the nose at night Discussed that if she does get a bleed to apply pressure If nosebleeds continue advised her to let me know Discussed she is more likely to have nosebleeds because of being on Eliquis 

## 2024-04-29 NOTE — Assessment & Plan Note (Signed)
 Chronic Regular exercise and healthy diet encouraged Check lipid panel Lab Results  Component Value Date   LDLCALC 73 04/22/2024   LDL well controlled Continue Crestor  10 mg daily

## 2024-04-29 NOTE — Assessment & Plan Note (Signed)
 Chronic Following with cardiology On diltiazem  120 mg daily, metoprolol  XL 50 mg daily and Eliquis  2.5 mg twice daily

## 2024-04-29 NOTE — Assessment & Plan Note (Signed)
 Chronic Lab Results  Component Value Date   VITAMINB12 >1537 (H) 08/13/2023   Continue B12 supplementation

## 2024-04-29 NOTE — Assessment & Plan Note (Signed)
 Chronic-history of stroke Continue eliquis , aspirin 81 mg daily, Crestor  10 mg daily,BP meds Blood pressure well-controlled Sugars controlled

## 2024-04-29 NOTE — Assessment & Plan Note (Signed)
 Chronic Following with cardiology No chest pain, shortness of breath On metoprolol , diltiazem , Imdur , lisinopril , Crestor , ASA, Eliquis  2.5 mg twice daily  Lab Results  Component Value Date   LDLCALC 73 04/22/2024

## 2024-04-29 NOTE — Assessment & Plan Note (Signed)
 Chronic Stage 3b Stable CBC, CMP Continue lasix  20 mg daily

## 2024-04-29 NOTE — Assessment & Plan Note (Signed)
 Chronic Mild bilaterally Continue ASA, Eliquis , crestor  10 mg daily

## 2024-04-29 NOTE — Assessment & Plan Note (Signed)
 Chronic Associated with CAD, hyperlipidemia  Lab Results  Component Value Date   HGBA1C 5.8 04/22/2024   Sugars well controlled Continue diet control Encouraged regular exercise, diabetic diet

## 2024-04-29 NOTE — Assessment & Plan Note (Addendum)
 Chronic Likely diabetic or idiopathic Not currently on medication Normal sensation in feet with light touch Poor balance which is likely multifactorial

## 2024-04-29 NOTE — Assessment & Plan Note (Signed)
 New Having easy bruising with minimal no trauma Likely related to Eliquis  and thinning of the skin is aging Reassured

## 2024-05-07 ENCOUNTER — Ambulatory Visit: Admitting: Physician Assistant

## 2024-05-19 ENCOUNTER — Encounter: Payer: Self-pay | Admitting: Physician Assistant

## 2024-05-19 ENCOUNTER — Ambulatory Visit

## 2024-05-19 ENCOUNTER — Ambulatory Visit (INDEPENDENT_AMBULATORY_CARE_PROVIDER_SITE_OTHER): Admitting: Physician Assistant

## 2024-05-19 VITALS — BP 102/62 | HR 89 | Resp 20 | Ht 62.0 in | Wt 133.0 lb

## 2024-05-19 DIAGNOSIS — R413 Other amnesia: Secondary | ICD-10-CM | POA: Diagnosis not present

## 2024-05-19 NOTE — Progress Notes (Signed)
 Assessment & Plan  Memory Impairment, concern for Alzheimer's Disease   Judy [name] is a very pleasant 89 y.o. year old RH female with a history of hypertension, hyperlipidemia, seen today for evaluation of memory loss. Memory changes worse since a urinary tract infection in July 2025, with difficulty in short-term memory, time orientation, and medication management. Long-term memory remains good. No wandering tendencies, hallucinations, or paranoia. Anxiety related to appointments. Conservative management preferred by the patient, avoiding  procedures like MRI or added labs. MoCA today 15/30. Most recent CT of the head March 2025 remarkable for chronic right superior cerebellar infarct without any other acute intracranial abnormalities, right greater than left cerebral atrophy no ventriculomegaly.  Discussed potential use of donepezil to slow cognitive decline, she prefers to have her PCP managing this medicine if she decides to take it. The patient is accompanied by her daughter who supplements  the history.   - Recommend  starting donepezil 5 mg, with potential increase to 10 mg if tolerated. Patient prefers PCP to manage medication, side effects discussed -Patient declines any further procedures including MRI brain or studies such as neurocognitive testing.  - Recommended Mediterranean diet to support cognitive health. - Provided resources for family support and management of cognitive changes. No follow up per patient's preference  Discussed the use of AI scribe software for clinical note transcription with the patient, who gave verbal consent to proceed.  History of Present Illness Judy [name] is a 89 year old female who presents with cognitive changes following a urinary tract infection.  She has been experiencing memory changes since a urinary tract infection (UTI) this past summer, which was not diagnosed until July and likely persisted for several weeks. During this time,  she began calling family members in the middle of the night, indicating disorientation with time. Despite treatment for the UTI, she has not regained her previous sense of time and continues to have difficulty with medication management, such as taking pills out of order. Her long-term memory remains intact, as she recognizes family members and recalls past events. However, she repeats questions about appointments and sometimes misplaces items, finding them in unusual places. She engages in activities such as visiting family, doing crossword puzzles, and reading.No wandering tendencies or significant changes in mood, though she experiences anxiety related to appointments. She lives alone but is supported by her family, who assist with banking and medication management. She stopped driving two to three years ago due to vision issues related to macular degeneration, for which she receives regular injections. She has a history of arthritis in her knees and receives injections for this as well. No recent falls or head injuries, though she did fall in August or September after the UTI, without injury.  No hallucinations or paranoia since the UTI, though she had vivid dreams during the infection. Her sleep pattern includes more daytime sleep than before, and she sometimes sleeps with her mouth open. No significant hygiene concerns, and she manages her personal care independently.       Results  Recent TSH 1.30, B12 in the 1500  Allergies  Allergen Reactions   Betadine [Povidone Iodine] Other (See Comments)    burns eyes   Methocarbamol  Other (See Comments)    dizziness    Current Outpatient Medications  Medication Instructions   acetaminophen (TYLENOL) 500 mg, Every 6 hours PRN   aspirin 81 mg, Daily   azelastine  (ASTELIN ) 0.1 % nasal spray 2 sprays, 2 times  daily PRN   b complex vitamins tablet 1 tablet, Oral, Daily   diltiazem  (CARDIZEM  CD) 120 mg, Oral, Daily   Eliquis  2.5 mg, Oral, 2 times  daily   furosemide  (LASIX ) 20 mg, Oral, Daily   isosorbide  mononitrate (IMDUR ) 30 MG 24 hr tablet TAKE 1 TABLET BY MOUTH EVERYDAY AT BEDTIME   lisinopril  (ZESTRIL ) 10 mg, Oral, Daily   loratadine (CLARITIN) 10 mg, Daily   metoprolol  succinate (TOPROL -XL) 50 MG 24 hr tablet TAKE 1 TABLET BY MOUTH EVERY DAY WITH OR IMMEDIATELY FOLLOWING A MEAL   Multiple Vitamin (MULTIVITAMIN WITH MINERALS) TABS tablet 1 tablet, Daily   nitroGLYCERIN  (NITROSTAT ) 0.4 MG SL tablet PLACE 1 TABLET UNDER TONGUE EVERY 5 MINUTES AS NEEDED FOR CHEST PAIN   omeprazole (PRILOSEC) 20 mg, Daily   potassium chloride  (MICRO-K ) 10 MEQ CR capsule 10 mEq, Oral, Daily   Probiotic Product (PROBIOTIC ADVANCED PO) 1 tablet   rosuvastatin  (CRESTOR ) 10 mg, Oral, Daily   sodium chloride (OCEAN) 0.65 % SOLN nasal spray 1 spray, As needed     VITALS:   Vitals:   05/19/24 0936  BP: 102/62  Pulse: 89  Resp: 20  SpO2: 97%  Weight: 133 lb (60.3 kg)  Height: 5' 2 (1.575 m)     Neurological Exam     05/19/2024   12:00 PM  Montreal Cognitive Assessment   Visuospatial/ Executive (0/5) 1  Naming (0/3) 2  Attention: Read list of digits (0/2) 1  Attention: Read list of letters (0/1) 1  Attention: Serial 7 subtraction starting at 100 (0/3) 0  Language: Repeat phrase (0/2) 2  Language : Fluency (0/1) 0  Abstraction (0/2) 2  Delayed Recall (0/5) 1  Orientation (0/6) 4  Total 14  Adjusted Score (based on education) 15       05/15/2017   11:39 AM  MMSE - Mini Mental State Exam  Orientation to time 5   Orientation to Place 5   Registration 3   Attention/ Calculation 5   Recall 2   Language- name 2 objects 2   Language- repeat 1  Language- follow 3 step command 3   Language- read & follow direction 1   Write a sentence 1   Copy design 1   Total score 29      Data saved with a previous flowsheet row definition       Orientation:  Alert and oriented to person, place and not to time. No aphasia or dysarthria. Fund  of knowledge is appropriate. Recent and remote memory impaired.  Attention and concentration are reduced.  Able to name objects 2/3 and repeat phrases.  Delayed recall 1 /5  Cranial nerves: There is good facial symmetry. Extraocular muscles are intact and visual fields are full to confrontational testing. Speech is fluent and clear. No tongue deviation. Hearing is intact to conversational tone. Tone: Tone is good throughout. Sensation: Sensation is intact to light touch.  Vibration is intact at the bilateral big toe.  Coordination: The patient has no difficulty with RAM's or FNF bilaterally. Normal finger to nose  Motor: Strength is 5/5 in the bilateral upper and lower extremities. There is no pronator drift. There are no fasciculations noted. DTR's: Deep tendon reflexes are 2/4 bilaterally. Gait and Station: The patient is able to ambulate without difficulty. Gait is cautious and narrow, slow paced.       Thank you for allowing us  the opportunity to participate in the care of this nice patient. Please do not hesitate  to contact us  for any questions or concerns.   Total time spent on today's visit was 67 minutes dedicated to this patient today, preparing to see patient, examining the patient, ordering tests and/or medications and counseling the patient, documenting clinical information in the EHR or other health record, independently interpreting results and communicating results to the patient/family, discussing treatment and goals, answering patient's questions and coordinating care.  Cc:  Geofm Glade PARAS, MD  Camie Sevin 05/19/2024 12:20 PM

## 2024-05-19 NOTE — Patient Instructions (Addendum)
 It was a pleasure to see you today at our office.   Recommendations:  Recommend to start donepezil per PCP Recommend visiting the website :  Dementia Success Path to better understand some behaviors related to memory loss.  For psychiatric meds, mood meds: Please have your primary care physician manage these medications.  If you have any severe symptoms of a stroke, or other severe issues such as confusion,severe chills or fever, etc call 911 or go to the ER as you may need to be evaluated further For guidance regarding [facility] and if placement were needed at the facility, contact Social Worker tel: [phone]  For assessment of decision of mental capacity and competency:  Call Dr. Rosaline Nine, geriatric psychiatrist at [phone] Counseling regarding caregiver distress, including caregiver depression, anxiety and issues regarding community resources, adult day care programs, adult living facilities, or memory care questions:  please contact your  Primary Doctor's Social Worker   FOR Memory  decline, memory medications: Call our office [phone]    [email or web address]   RECOMMENDATIONS FOR ALL PATIENTS WITH MEMORY PROBLEMS: 1. Continue to exercise (Recommend 30 minutes of walking everyday, or 3 hours every week) 2. Increase social interactions - continue going to [place] and enjoy social gatherings with friends and family 3. Eat healthy, avoid fried foods and eat more fruits and vegetables 4. Maintain adequate blood pressure, blood sugar, and blood cholesterol level. Reducing the risk of stroke and cardiovascular disease also helps promoting better memory. 5. Avoid stressful situations. Live a simple life and avoid aggravations. Organize your time and prepare for the next day in anticipation. 6. Sleep well, avoid any interruptions of sleep and avoid any distractions in the bedroom that may interfere with  adequate sleep quality 7. Avoid sugar, avoid sweets as there is a strong link between excessive sugar intake, diabetes, and cognitive impairment We discussed the Mediterranean diet, which has been shown to help patients reduce the risk of progressive memory disorders and reduces cardiovascular risk. This includes eating fish, eat fruits and green leafy vegetables, nuts like almonds and hazelnuts, walnuts, and also use olive oil. Avoid fast foods and fried foods as much as possible. Avoid sweets and sugar as sugar use has been linked to worsening of memory function.  There is always a concern of gradual progression of memory problems. If this is the case, then we may need to adjust level of care according to patient needs. Support, both to the patient and caregiver, should then be put into place.          FALL PRECAUTIONS: Be cautious when walking. Scan the area for obstacles that may increase the risk of trips and falls. When getting up in the mornings, sit up at the edge of the bed for a few minutes before getting out of bed. Consider elevating the bed at the head end to avoid drop of blood pressure when getting up. Walk always in a well-lit room (use night lights in the walls). Avoid area rugs or power cords from appliances in the middle of the walkways. Use a walker or a cane if necessary and consider physical therapy for balance exercise. Get your eyesight checked regularly.  FINANCIAL OVERSIGHT: Supervision, especially oversight when making financial decisions or transactions is also recommended.  HOME SAFETY: Consider the safety of the kitchen when operating appliances like stoves, microwave oven, and blender. Consider having supervision and share cooking responsibilities until no longer able to participate in those. Accidents with firearms and other  hazards in the house should be identified and addressed as well.   ABILITY TO BE LEFT ALONE: If patient is unable to contact 911 operator,  consider using [employer], or when the need is there, arrange for someone to stay with patients. Smoking is a fire hazard, consider supervision or cessation. Risk of wandering should be assessed by caregiver and if detected at any point, supervision and safe proof recommendations should be instituted.  MEDICATION SUPERVISION: Inability to self-administer medication needs to be constantly addressed. Implement a mechanism to ensure safe administration of the medications.      Mediterranean Diet A Mediterranean diet refers to food and lifestyle choices that are based on the traditions of countries located on the [place]. This way of eating has been shown to help prevent certain conditions and improve outcomes for people who have chronic diseases, like kidney disease and heart disease. What are tips for following this plan? Lifestyle  Cook and eat meals together with your family, when possible. Drink enough fluid to keep your urine clear or pale yellow. Be physically active every day. This includes: Aerobic exercise like running or swimming. Leisure activities like gardening, walking, or housework. Get 7-8 hours of sleep each night. If recommended by your health care provider, drink red wine in moderation. This means 1 glass a day for nonpregnant women and 2 glasses a day for men. A glass of wine equals 5 oz (150 mL). Reading food labels  Check the serving size of packaged foods. For foods such as rice and pasta, the serving size refers to the amount of cooked product, not dry. Check the total fat in packaged foods. Avoid foods that have saturated fat or trans fats. Check the ingredients list for added sugars, such as corn syrup. Shopping  At the grocery store, buy most of your food from the areas near the walls of the store. This includes: Fresh fruits and vegetables (produce). Grains, beans, nuts, and seeds. Some of these may be available in unpackaged forms or large amounts (in  bulk). Fresh seafood. Poultry and eggs. Low-fat dairy products. Buy whole ingredients instead of prepackaged foods. Buy fresh fruits and vegetables in-season from local farmers markets. Buy frozen fruits and vegetables in resealable bags. If you do not have access to quality fresh seafood, buy precooked frozen shrimp or canned fish, such as tuna, salmon, or sardines. Buy small amounts of raw or cooked vegetables, salads, or olives from the deli or salad bar at your store. Stock your pantry so you always have certain foods on hand, such as olive oil, canned tuna, canned tomatoes, rice, pasta, and beans. Cooking  Cook foods with extra-virgin olive oil instead of using butter or other vegetable oils. Have meat as a side dish, and have vegetables or grains as your main dish. This means having meat in small portions or adding small amounts of meat to foods like pasta or stew. Use beans or vegetables instead of meat in common dishes like chili or lasagna. Experiment with different cooking methods. Try roasting or broiling vegetables instead of steaming or sauteing them. Add frozen vegetables to soups, stews, pasta, or rice. Add nuts or seeds for added healthy fat at each meal. You can add these to yogurt, salads, or vegetable dishes. Marinate fish or vegetables using olive oil, lemon juice, garlic, and fresh herbs. Meal planning  Plan to eat 1 vegetarian meal one day each week. Try to work up to 2 vegetarian meals, if possible. Eat seafood 2 or more  times a week. Have healthy snacks readily available, such as: Vegetable sticks with hummus. Greek yogurt. Fruit and nut trail mix. Eat balanced meals throughout the week. This includes: Fruit: 2-3 servings a day Vegetables: 4-5 servings a day Low-fat dairy: 2 servings a day Fish, poultry, or lean meat: 1 serving a day Beans and legumes: 2 or more servings a week Nuts and seeds: 1-2 servings a day Whole grains: 6-8 servings a day Extra-virgin  olive oil: 3-4 servings a day Limit red meat and sweets to only a few servings a month What are my food choices? Mediterranean diet Recommended Grains: Whole-grain pasta. Brown rice. Bulgar wheat. Polenta. Couscous. Whole-wheat bread. [place] Made[place]. Vegetables: Artichokes. Beets. Broccoli. Cabbage. Carrots. Eggplant. Green beans. Chard. Kale. Spinach. Onions. Leeks. Peas. Squash. Tomatoes. Peppers. Radishes. Fruits: Apples. Apricots. Avocado. Berries. Bananas. Cherries. Dates. Figs. Grapes. Lemons. Melon. Oranges. Peaches. Plums. Pomegranate. Meats and other protein foods: Beans. Almonds. Sunflower seeds. Pine nuts. Peanuts. Cod. Salmon. Scallops. Shrimp. Tuna. Tilapia. Clams. Oysters. Eggs. Dairy: Low-fat milk. Cheese. Greek yogurt. Beverages: Water. Red wine. Herbal tea. Fats and oils: Extra virgin olive oil. Avocado oil. Grape seed oil. Sweets and desserts: Greek yogurt with honey. Baked apples. Poached pears. Trail mix. Seasoning and other foods: Basil. Cilantro. Coriander. Cumin. Mint. Parsley. Sage. Rosemary. Tarragon. Garlic. Oregano. Thyme. Pepper. Balsalmic vinegar. Tahini. Hummus. Tomato sauce. Olives. Mushrooms. Limit these Grains: Prepackaged pasta or rice dishes. Prepackaged cereal with added sugar. Vegetables: Deep fried potatoes (french fries). Fruits: Fruit canned in syrup. Meats and other protein foods: Beef. Pork. Lamb. Poultry with skin. Hot dogs. [place]. Dairy: Ice cream. Sour cream. Whole milk. Beverages: Juice. Sugar-sweetened soft drinks. Beer. Liquor and spirits. Fats and oils: Butter. Canola oil. Vegetable oil. Beef fat (tallow). Lard. Sweets and desserts: Cookies. Cakes. Pies. Candy. Seasoning and other foods: Mayonnaise. Premade sauces and marinades. The items listed may not be a complete list. Talk with your dietitian about what dietary choices are right for you. Summary The Mediterranean diet includes both food and lifestyle choices. Eat a variety of fresh  fruits and vegetables, beans, nuts, seeds, and whole grains. Limit the amount of red meat and sweets that you eat. Talk with your health care provider about whether it is safe for you to drink red wine in moderation. This means 1 glass a day for nonpregnant women and 2 glasses a day for men. A glass of wine equals 5 oz (150 mL). This information is not intended to replace advice given to you by your health care provider. Make sure you discuss any questions you have with your health care provider. Document Released: 01/20/2016 Document Revised: 02/22/2016 Document Reviewed: 01/20/2016 Elsevier Interactive Patient Education  2017 [employer].

## 2024-06-09 ENCOUNTER — Other Ambulatory Visit: Payer: Self-pay

## 2024-06-09 MED ORDER — ROSUVASTATIN CALCIUM 10 MG PO TABS: 10.0000 mg | ORAL_TABLET | Freq: Every day | ORAL | 1 refills | Status: AC

## 2024-06-26 ENCOUNTER — Other Ambulatory Visit: Payer: Self-pay | Admitting: Cardiovascular Disease

## 2024-06-27 MED ORDER — LISINOPRIL 10 MG PO TABS: 10.0000 mg | ORAL_TABLET | Freq: Every day | ORAL | 1 refills | Status: AC

## 2024-10-28 ENCOUNTER — Ambulatory Visit: Admitting: Internal Medicine

## 2024-12-09 ENCOUNTER — Ambulatory Visit
